# Patient Record
Sex: Male | Born: 1980 | Race: Black or African American | Hispanic: No | Marital: Married | Smoking: Former smoker
Health system: Southern US, Community
[De-identification: ages and names within clinical notes are randomized; demographics above are authoritative.]

## PROBLEM LIST (undated history)

## (undated) DIAGNOSIS — I70219 Atherosclerosis of native arteries of extremities with intermittent claudication, unspecified extremity: Secondary | ICD-10-CM

## (undated) DIAGNOSIS — M549 Dorsalgia, unspecified: Secondary | ICD-10-CM

## (undated) DIAGNOSIS — R110 Nausea: Secondary | ICD-10-CM

## (undated) DIAGNOSIS — F419 Anxiety disorder, unspecified: Secondary | ICD-10-CM

## (undated) DIAGNOSIS — M059 Rheumatoid arthritis with rheumatoid factor, unspecified: Secondary | ICD-10-CM

## (undated) DIAGNOSIS — I10 Essential (primary) hypertension: Secondary | ICD-10-CM

## (undated) DIAGNOSIS — I499 Cardiac arrhythmia, unspecified: Secondary | ICD-10-CM

## (undated) DIAGNOSIS — R0600 Dyspnea, unspecified: Secondary | ICD-10-CM

## (undated) DIAGNOSIS — M359 Systemic involvement of connective tissue, unspecified: Secondary | ICD-10-CM

## (undated) DIAGNOSIS — E785 Hyperlipidemia, unspecified: Secondary | ICD-10-CM

## (undated) HISTORY — PX: HIP PINNING: SHX1757

---

## 2004-12-16 ENCOUNTER — Emergency Department: Payer: Self-pay | Admitting: Internal Medicine

## 2005-11-25 ENCOUNTER — Emergency Department: Payer: Self-pay | Admitting: Emergency Medicine

## 2005-11-26 ENCOUNTER — Emergency Department: Payer: Self-pay | Admitting: Unknown Physician Specialty

## 2005-11-27 ENCOUNTER — Emergency Department: Payer: Self-pay | Admitting: Emergency Medicine

## 2005-12-25 ENCOUNTER — Emergency Department: Payer: Self-pay | Admitting: Emergency Medicine

## 2005-12-25 ENCOUNTER — Other Ambulatory Visit: Payer: Self-pay

## 2006-05-01 DIAGNOSIS — I10 Essential (primary) hypertension: Secondary | ICD-10-CM

## 2006-05-01 DIAGNOSIS — F419 Anxiety disorder, unspecified: Secondary | ICD-10-CM | POA: Insufficient documentation

## 2006-05-01 DIAGNOSIS — G4700 Insomnia, unspecified: Secondary | ICD-10-CM

## 2006-12-01 IMAGING — CR DG CHEST 2V
1 series · 2 of 2 positions shown · non-contrast
Comparison: none

REASON FOR EXAM: Coughing up blood
COMMENTS:

PROCEDURE:     DXR - DXR CHEST PA (OR AP) AND LATERAL  - [DATE] [DATE]
RESULT:     PA and lateral views reveal the cardiomediastinal structures to
be within normal limits. The lung fields are clear. The vascularity is
within normal limits with no effusions noted.

[Series 1: view not recorded · 0.17mm/px · 2 of 2 slices shown]
[im 1/2]
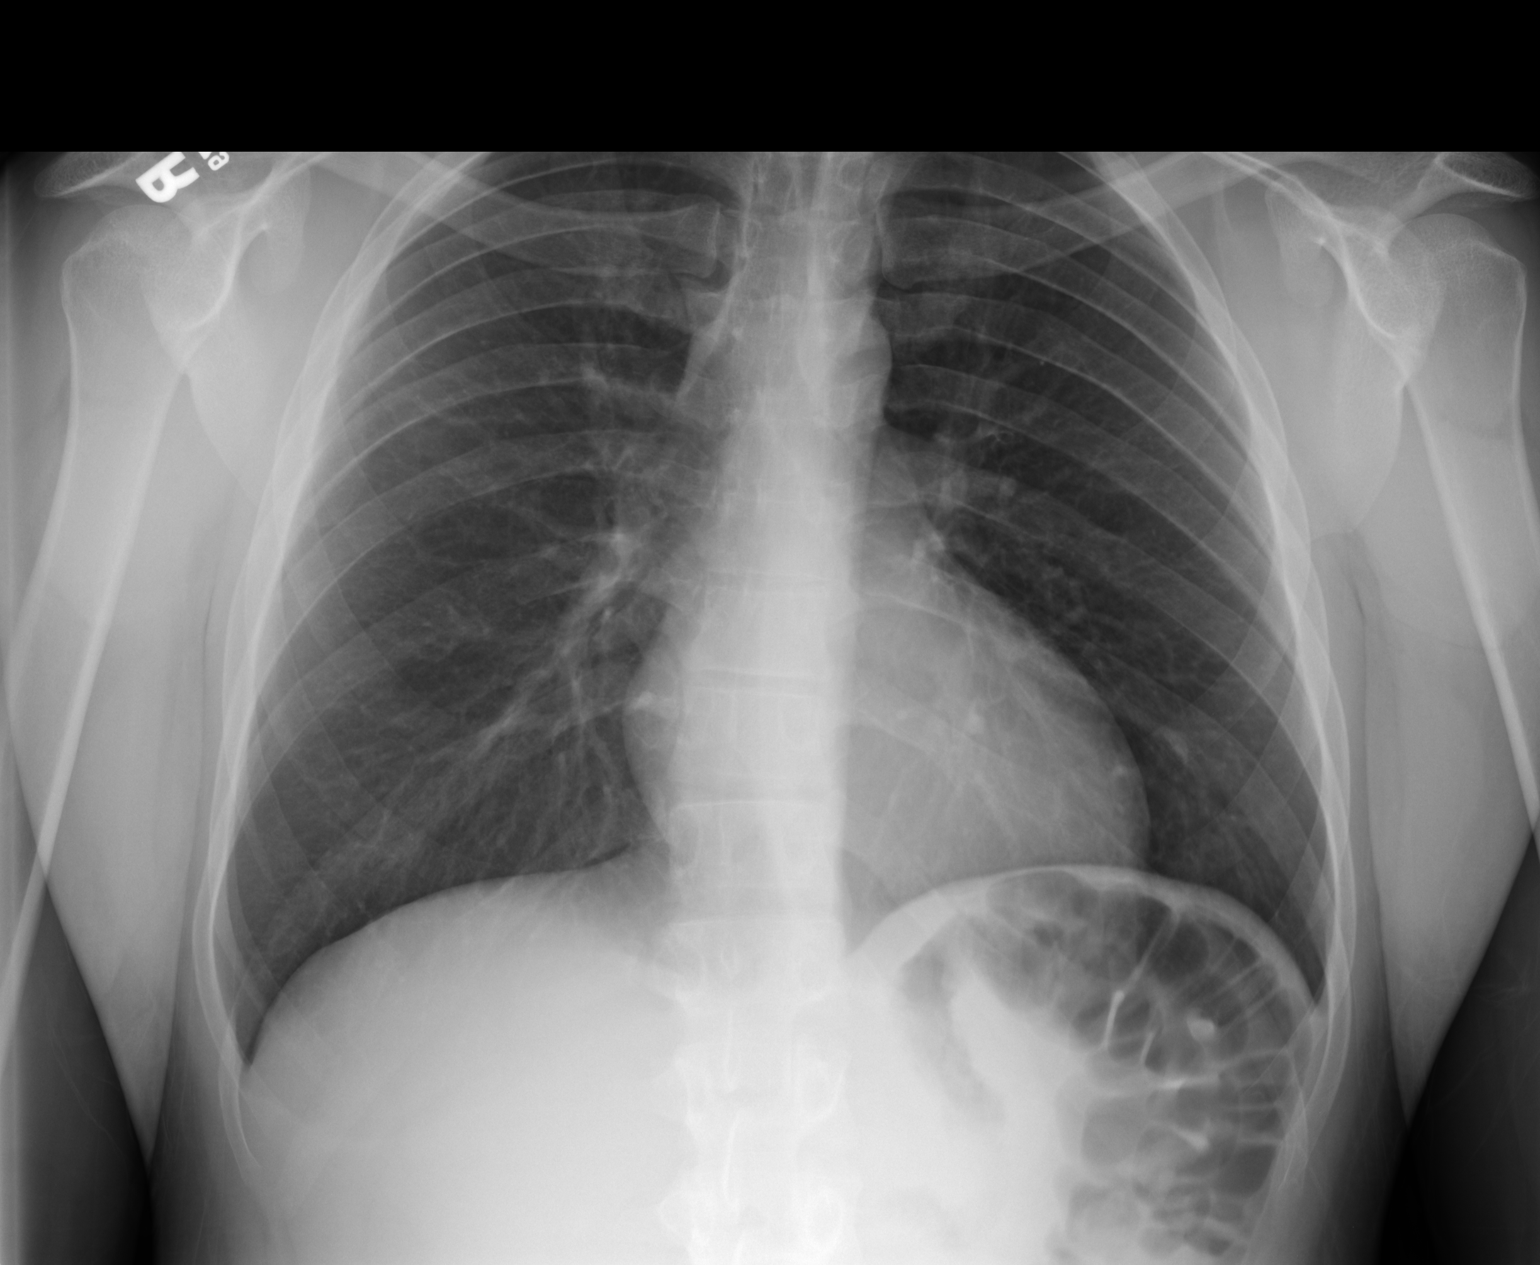
[im 2/2]
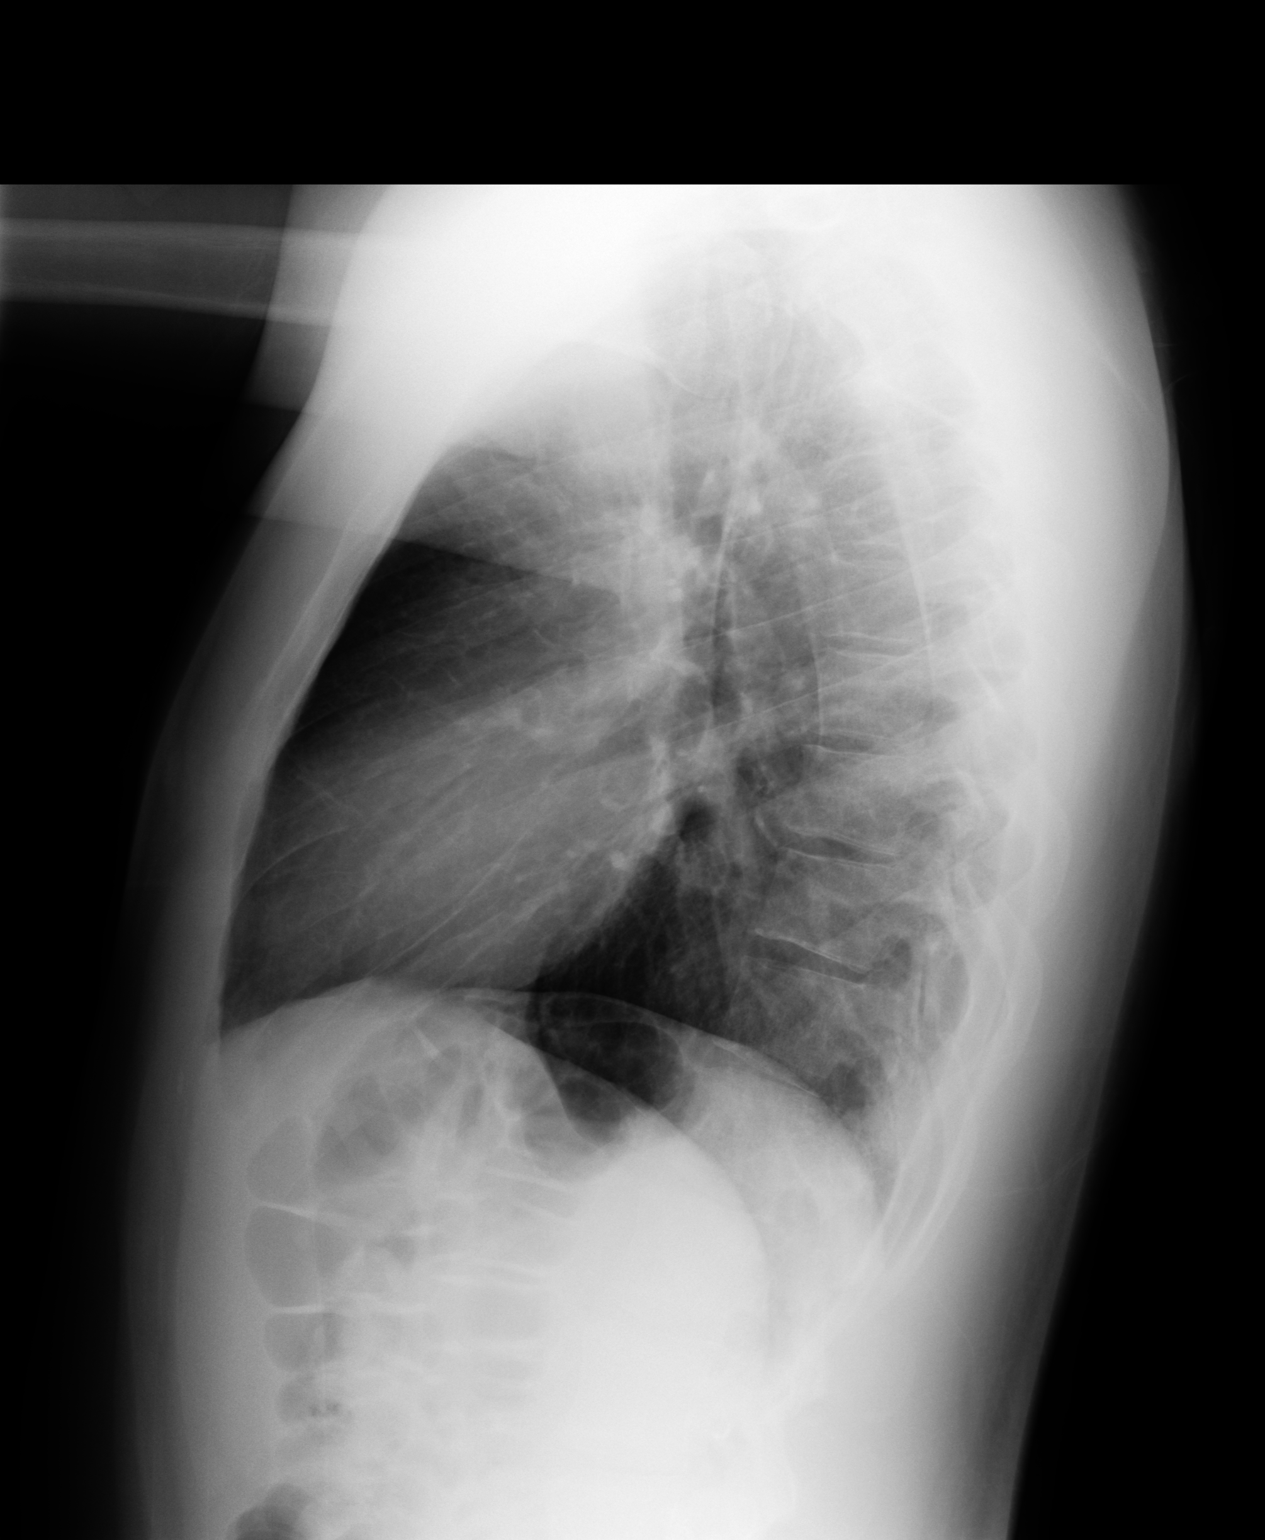

[2 of 2 positions shown; findings below may reference images not displayed]

IMPRESSION: No active cardiopulmonary disease identified.

## 2007-02-01 ENCOUNTER — Emergency Department: Payer: Self-pay | Admitting: Unknown Physician Specialty

## 2008-03-28 DIAGNOSIS — F17200 Nicotine dependence, unspecified, uncomplicated: Secondary | ICD-10-CM | POA: Insufficient documentation

## 2008-10-03 ENCOUNTER — Ambulatory Visit: Payer: Self-pay | Admitting: Family Medicine

## 2008-10-03 IMAGING — US ABDOMEN ULTRASOUND
1 series · 17 of 25 positions shown · non-contrast
Comparison: none

REASON FOR EXAM: nausea vomitting RUQ abd pain
COMMENTS:

[Series 1: abdomen ultrasound · 17 of 69 slices shown]
[im 1/69]
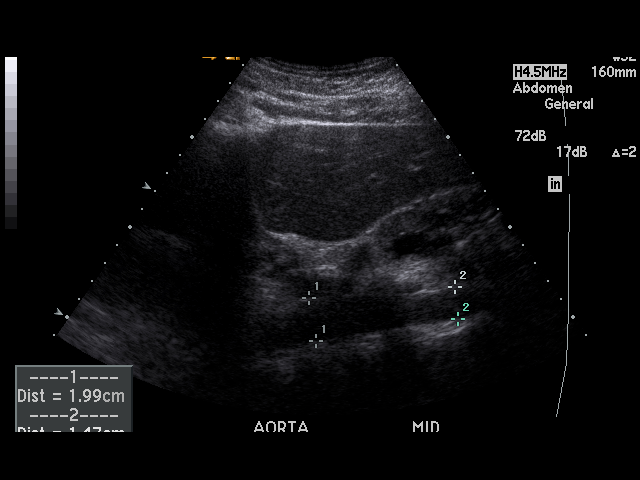
[im 6/69]
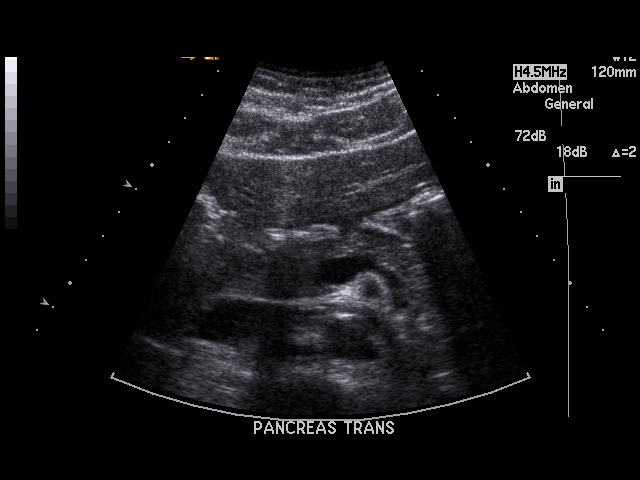
[im 9/69]
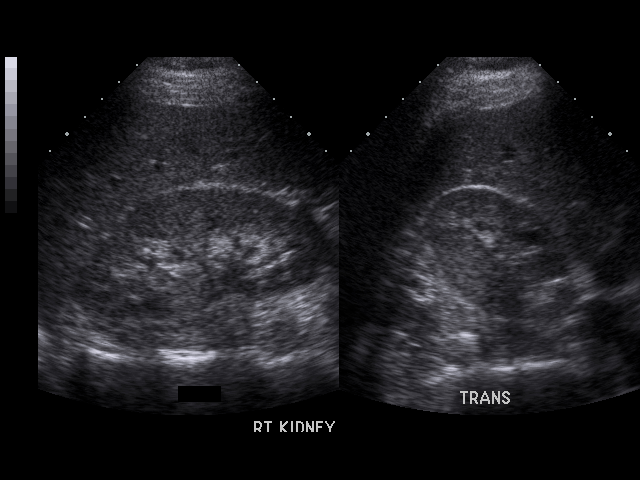
[im 15/69]
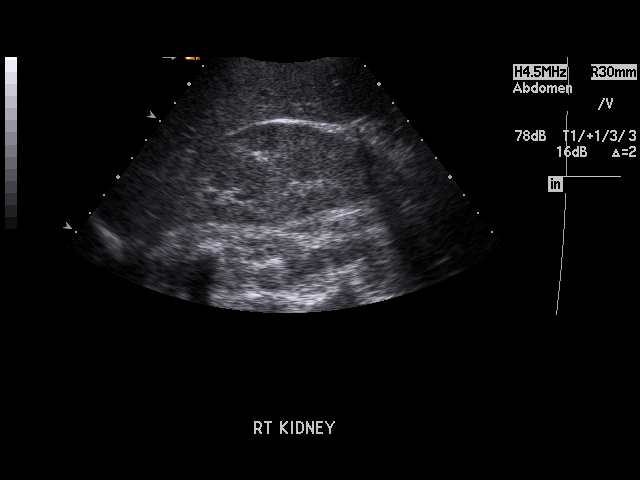
[im 18/69]
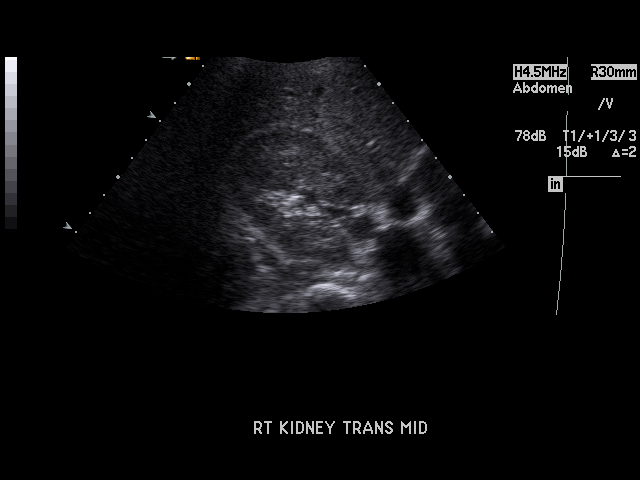
[im 23/69]
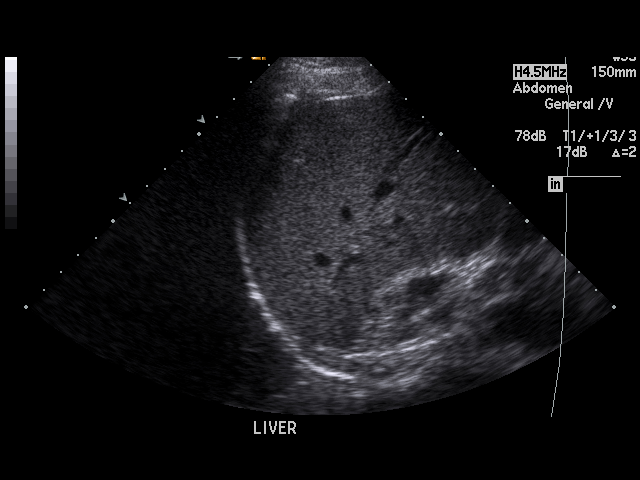
[im 26/69]
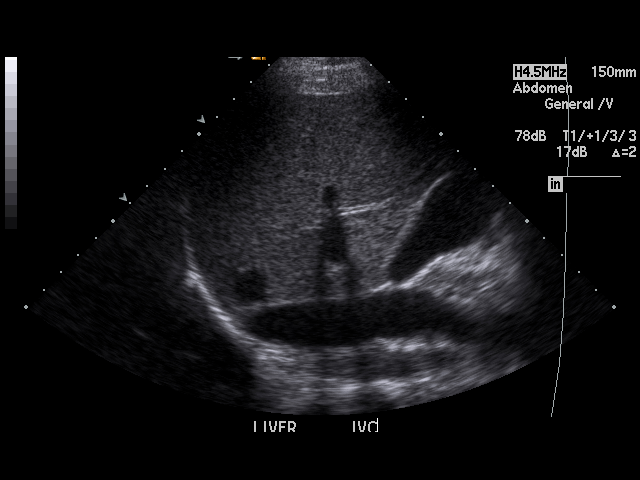
[im 32/69]
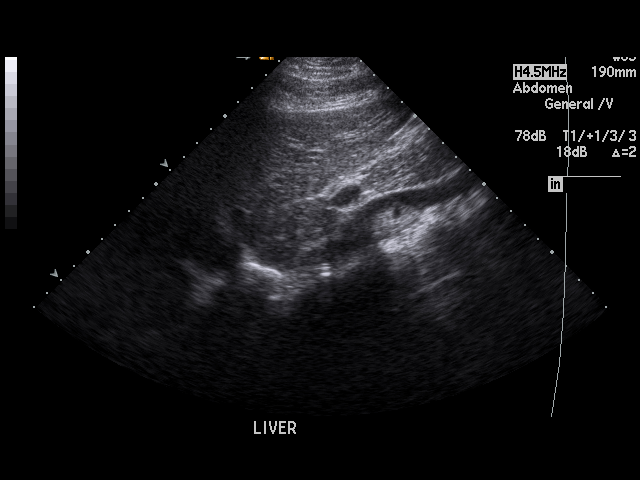
[im 35/69]
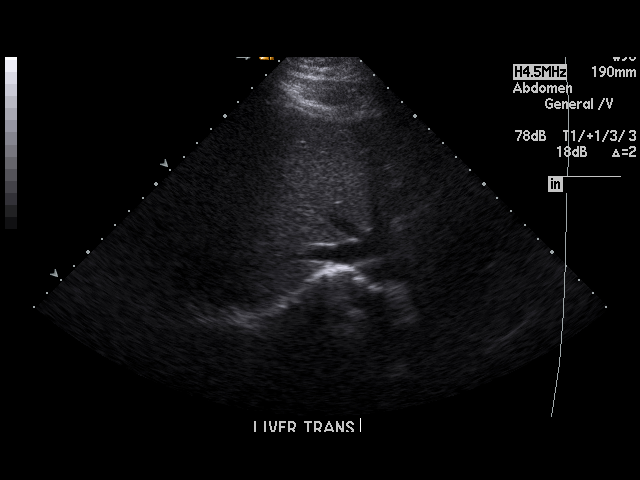
[im 37/69]
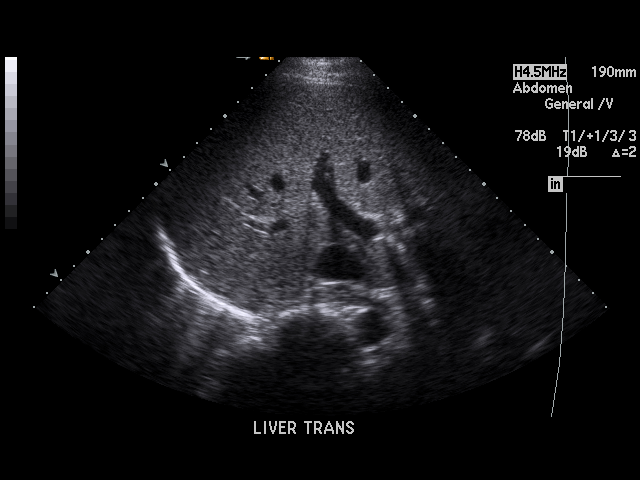
[im 43/69]
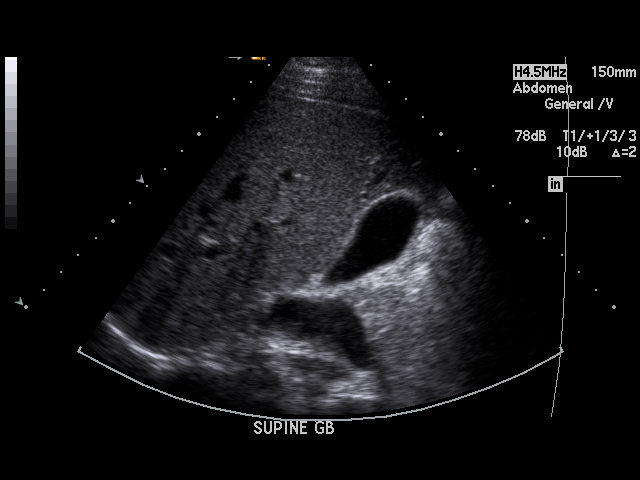
[im 46/69]
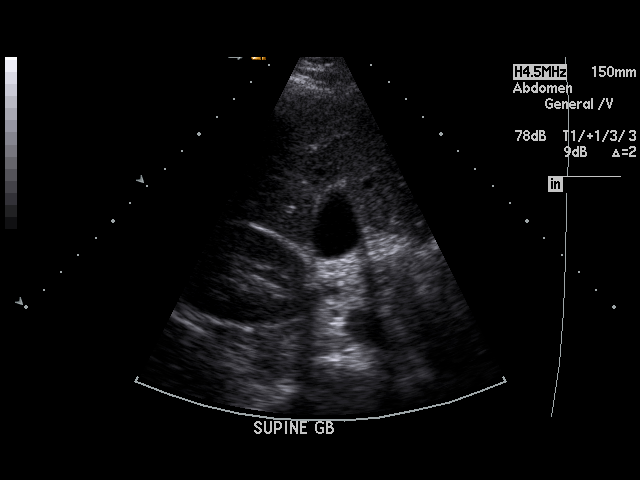
[im 52/69]
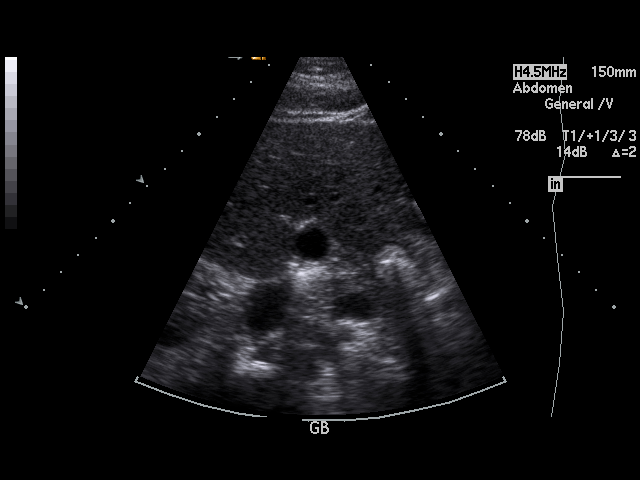
[im 54/69]
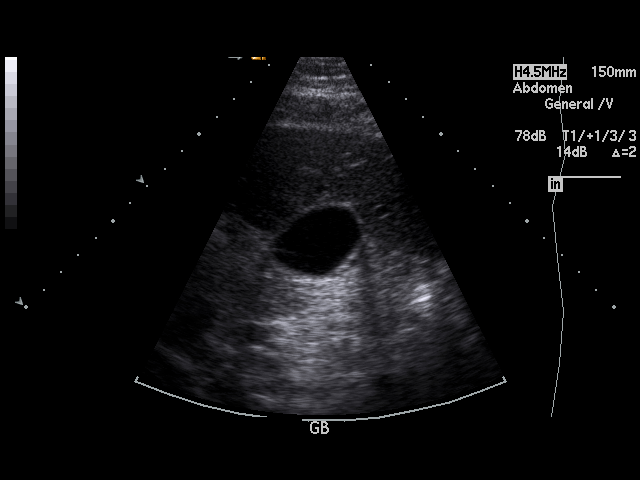
[im 60/69]
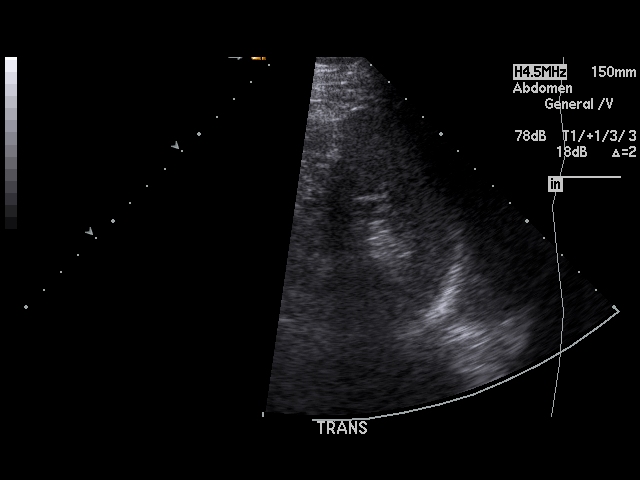
[im 63/69]
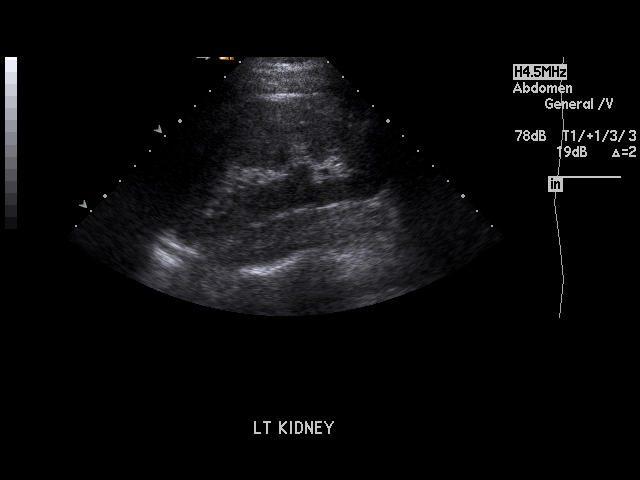
[im 69/69]
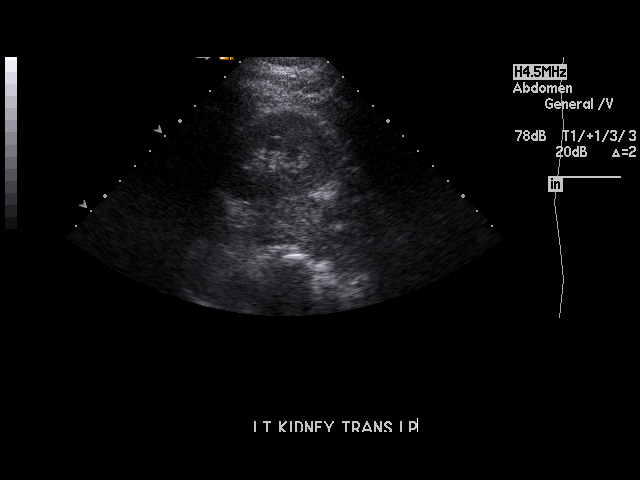

[17 of 25 positions shown; findings below may reference images not displayed]

PROCEDURE:     US  - US ABDOMEN GENERAL SURVEY  - [DATE]  [DATE]

RESULT:     The liver, spleen, pancreas, abdominal aorta and inferior vena
cava show no significant abnormalities. No gallstones are seen. There is no
thickening of the gallbladder wall. The common bile duct measures 2.8 mm in
diameter which is within normal limits. The kidneys show no hydronephrosis.
Incidental note is made of a 1.23 cm cyst of the midpole region of the right
kidney. There is no ascites.
IMPRESSION: Normal study except for an incidentally noted cyst of the right kidney.

## 2010-03-14 DIAGNOSIS — E785 Hyperlipidemia, unspecified: Secondary | ICD-10-CM

## 2010-10-12 ENCOUNTER — Inpatient Hospital Stay: Payer: Self-pay | Admitting: Psychiatry

## 2012-01-31 DIAGNOSIS — L0100 Impetigo, unspecified: Secondary | ICD-10-CM | POA: Insufficient documentation

## 2014-09-19 DIAGNOSIS — R7689 Other specified abnormal immunological findings in serum: Secondary | ICD-10-CM | POA: Insufficient documentation

## 2014-09-19 DIAGNOSIS — M2550 Pain in unspecified joint: Secondary | ICD-10-CM | POA: Insufficient documentation

## 2014-09-19 DIAGNOSIS — R768 Other specified abnormal immunological findings in serum: Secondary | ICD-10-CM | POA: Insufficient documentation

## 2014-09-19 DIAGNOSIS — L300 Nummular dermatitis: Secondary | ICD-10-CM | POA: Insufficient documentation

## 2015-09-01 DIAGNOSIS — R9431 Abnormal electrocardiogram [ECG] [EKG]: Secondary | ICD-10-CM | POA: Insufficient documentation

## 2015-09-18 DIAGNOSIS — I70219 Atherosclerosis of native arteries of extremities with intermittent claudication, unspecified extremity: Secondary | ICD-10-CM | POA: Insufficient documentation

## 2016-05-13 ENCOUNTER — Encounter: Payer: Self-pay | Admitting: Emergency Medicine

## 2016-05-13 ENCOUNTER — Emergency Department
Admission: EM | Admit: 2016-05-13 | Discharge: 2016-05-13 | Disposition: A | Discharge status: Home / Self Care | Payer: Medicaid Other | Attending: Emergency Medicine | Admitting: Emergency Medicine

## 2016-05-13 DIAGNOSIS — M545 Low back pain, unspecified: Secondary | ICD-10-CM

## 2016-05-13 HISTORY — DX: Dorsalgia, unspecified: M54.9

## 2016-05-13 MED ORDER — DEXAMETHASONE SODIUM PHOSPHATE 4 MG/ML IJ SOLN
10.0000 mg | Freq: Once | INTRAMUSCULAR | Status: AC
  Administered 2016-05-13: 10 mg via INTRAMUSCULAR
  Filled 2016-05-13: qty 3
  Filled 2016-05-13: qty 1

## 2016-05-13 MED ORDER — KETOROLAC TROMETHAMINE 30 MG/ML IJ SOLN
30.0000 mg | Freq: Once | INTRAMUSCULAR | Status: AC
  Administered 2016-05-13: 30 mg via INTRAMUSCULAR
  Filled 2016-05-13: qty 1

## 2016-05-13 MED ORDER — NAPROXEN 500 MG PO TABS: 500.0000 mg | ORAL_TABLET | Freq: Two times a day (BID) | ORAL | 2 refills | Status: DC

## 2016-05-13 NOTE — ED Provider Notes (Signed)
[facility] Emergency Department Provider Note   ____________________________________________    I have reviewed the triage vital signs and the nursing notes.   HISTORY  Chief Complaint Back Pain     HPI Jeffrey Grant is a 35 y.o. male who presents with moderate, sharp, low back pain. Patient reports a history of rheumatoid arthritis, had been receiving methotrexate injections but has not seen his physician sent several months. He complains of lower back pain for 2-3 days, he states he has had this before as a rheumatoid flare but is not sure if this is same thing. He denies fevers or chills. No IVDA. Normal strength and sensation in his lower extremities. No abdominal pain   Past Medical History:  Diagnosis Date  . Back pain     There are no active problems to display for this patient.   History reviewed. No pertinent surgical history.  Prior to Admission medications   Medication Sig Start Date End Date Taking? Authorizing Provider  naproxen (NAPROSYN) 500 MG tablet Take 1 tablet (500 mg total) by mouth 2 (two) times daily with a meal. 05/13/16   Jene Every, MD     Allergies Patient has no known allergies.  No family history on file.  Social History Social History  Substance Use Topics  . Smoking status: Never Smoker  . Smokeless tobacco: Never Used  . Alcohol use Not on file    Review of Systems  Constitutional: No fever/chills     Gastrointestinal: No abdominal pain.  No nausea, no vomiting.   Genitourinary: Negative for dysuria. Musculoskeletal: Back pain as above Skin: Negative for rash. Neurological: Negative for weakness or sensory deficit    ____________________________________________   PHYSICAL EXAM:  VITAL SIGNS: ED Triage Vitals  Enc Vitals Group     BP 05/13/16 0845 (!) 191/98     Pulse Rate 05/13/16 0845 (!) 102     Resp 05/13/16 0845 18     Temp 05/13/16 0845 98.3 F (36.8 C)     Temp Source  05/13/16 0845 Oral     SpO2 05/13/16 0845 100 %     Weight 05/13/16 0845 180 lb (81.6 kg)     Height 05/13/16 0845 5\' 9"  (1.753 m)     Head Circumference --      Peak Flow --      Pain Score 05/13/16 0844 9     Pain Loc --      Pain Edu? --      Excl. in GC? --     Constitutional: Alert and oriented. No acute distress. Pleasant and interactive Eyes: Conjunctivae are normal.    Mouth/Throat: Mucous membranes are moist.   Cardiovascular: Normal rate, regular rhythm.  Respiratory: Normal respiratory effort.  No retractions. Genitourinary: deferred Musculoskeletal: Normal strength in the lower extremity. No vertebral tenderness to palpation. No palpable muscle spasms Neurologic:  Normal speech and language. No gross focal neurologic deficits are appreciated. Normal sensation in the lower extremity is Skin:  Skin is warm, dry and intact. No rash noted.   ____________________________________________   LABS (all labs ordered are listed, but only abnormal results are displayed)  Labs Reviewed - No data to display ____________________________________________  EKG   ____________________________________________  RADIOLOGY  None ____________________________________________   PROCEDURES  Procedure(s) performed: No    Critical Care performed: No ____________________________________________   INITIAL IMPRESSION / ASSESSMENT AND PLAN / ED COURSE  Pertinent labs & imaging results that were available during my care of  the patient were reviewed by me and considered in my medical decision making (see chart for details).  Patient uncomfortable from back pain but otherwise no acute distress. We will treat with Toradol and Decadron and reevaluate.  Patient significant improvement after treatment. He is anxious to go home and think this is reasonable. He knows to return if worsening symptoms. ____________________________________________   FINAL CLINICAL IMPRESSION(S) / ED  DIAGNOSES  Final diagnoses:  Acute bilateral low back pain without sciatica      NEW MEDICATIONS STARTED DURING THIS VISIT:  Discharge Medication List as of 05/13/2016 10:07 AM    START taking these medications   Details  naproxen (NAPROSYN) 500 MG tablet Take 1 tablet (500 mg total) by mouth 2 (two) times daily with a meal., Starting Mon 05/13/2016, Print         Note:  This document was prepared using Dragon voice recognition software and may include unintentional dictation errors.    Jene Every, MD 05/13/16 1414

## 2016-05-13 NOTE — ED Triage Notes (Signed)
Reports lower back pain, hx of the same.  Ambulates well.

## 2016-09-07 ENCOUNTER — Emergency Department
Admission: EM | Admit: 2016-09-07 | Discharge: 2016-09-07 | Disposition: A | Discharge status: Home / Self Care | Payer: Medicaid Other | Attending: Emergency Medicine | Admitting: Emergency Medicine

## 2016-09-07 ENCOUNTER — Encounter: Payer: Self-pay | Admitting: Emergency Medicine

## 2016-09-07 DIAGNOSIS — M545 Low back pain: Secondary | ICD-10-CM | POA: Diagnosis not present

## 2016-09-07 DIAGNOSIS — F17200 Nicotine dependence, unspecified, uncomplicated: Secondary | ICD-10-CM | POA: Insufficient documentation

## 2016-09-07 LAB — CBC
HCT: 45.4 % (ref 40.0–52.0)
Hemoglobin: 15.1 g/dL (ref 13.0–18.0)
MCH: 29.5 pg (ref 26.0–34.0)
MCHC: 33.3 g/dL (ref 32.0–36.0)
MCV: 88.7 fL (ref 80.0–100.0)
PLATELETS: 302 10*3/uL (ref 150–440)
RBC: 5.11 MIL/uL (ref 4.40–5.90)
RDW: 15.2 % — AB (ref 11.5–14.5)
WBC: 8.7 10*3/uL (ref 3.8–10.6)

## 2016-09-07 LAB — BASIC METABOLIC PANEL
Anion gap: 6 (ref 5–15)
BUN: 14 mg/dL (ref 6–20)
CHLORIDE: 101 mmol/L (ref 101–111)
CO2: 29 mmol/L (ref 22–32)
CREATININE: 0.74 mg/dL (ref 0.61–1.24)
Calcium: 9.1 mg/dL (ref 8.9–10.3)
GFR calc Af Amer: >60 mL/min (ref >60)
Glucose, Bld: 148 mg/dL — ABNORMAL HIGH (ref 65–99)
Potassium: 4.3 mmol/L (ref 3.5–5.1)
SODIUM: 136 mmol/L (ref 135–145)

## 2016-09-07 LAB — URINALYSIS, ROUTINE W REFLEX MICROSCOPIC
BILIRUBIN URINE: NEGATIVE
GLUCOSE, UA: NEGATIVE mg/dL
HGB URINE DIPSTICK: NEGATIVE
KETONES UR: NEGATIVE mg/dL
Leukocytes, UA: NEGATIVE
NITRITE: NEGATIVE
PH: 6 (ref 5.0–8.0)
Protein, ur: NEGATIVE mg/dL
Specific Gravity, Urine: 1.018 (ref 1.005–1.030)

## 2016-09-07 MED ORDER — DEXAMETHASONE SODIUM PHOSPHATE 10 MG/ML IJ SOLN
10.0000 mg | Freq: Once | INTRAMUSCULAR | Status: AC
  Administered 2016-09-07: 10 mg via INTRAMUSCULAR
  Filled 2016-09-07: qty 1

## 2016-09-07 MED ORDER — KETOROLAC TROMETHAMINE 60 MG/2ML IM SOLN
60.0000 mg | Freq: Once | INTRAMUSCULAR | Status: AC
  Administered 2016-09-07: 60 mg via INTRAMUSCULAR
  Filled 2016-09-07: qty 2

## 2016-09-07 MED ORDER — DEXAMETHASONE SODIUM PHOSPHATE 10 MG/ML IJ SOLN: 10.0000 mg | Freq: Once | INTRAMUSCULAR | Status: DC

## 2016-09-07 MED ORDER — NAPROXEN 500 MG PO TABS: 500.0000 mg | ORAL_TABLET | Freq: Two times a day (BID) | ORAL | 2 refills | Status: DC

## 2016-09-07 NOTE — ED Triage Notes (Signed)
Pt to ed with c/o back pain.  Pt states he was seen this past week at his PMD for protein in his urine. Reports pain continues in back.  Reports had blood drawn but does not know results.

## 2016-09-07 NOTE — ED Provider Notes (Signed)
[facility] Emergency Department Provider Note  ____________________________________________   I have reviewed the triage vital signs and the nursing notes.   HISTORY  Chief Complaint Back Pain   History limited by: Not Limited   HPI Jeffrey Grant is a 36 y.o. male who presents to the emergency department today because of concerns for low back pain. Patient states that he thinks it might be an RA flare. He had similar pain in the end of last year which did improve with steroids. Patient has history of her elbow currently is not on any medications. He denies any heavy lifting or abnormal work or exercise yesterday. He denies any change in defecation or urination. He does state however that his primary care doctor said he did have some protein in his urine.   Past Medical History:  Diagnosis Date  . Back pain     There are no active problems to display for this patient.   History reviewed. No pertinent surgical history.  Prior to Admission medications   Medication Sig Start Date End Date Taking? Authorizing Provider  naproxen (NAPROSYN) 500 MG tablet Take 1 tablet (500 mg total) by mouth 2 (two) times daily with a meal. 05/13/16   Jene Every, MD    Allergies Patient has no known allergies.  History reviewed. No pertinent family history.  Social History Social History  Substance Use Topics  . Smoking status: Current Every Day Smoker  . Smokeless tobacco: Never Used  . Alcohol use No    Review of Systems  Constitutional: Negative for fever. Cardiovascular: Negative for chest pain. Respiratory: Negative for shortness of breath. Gastrointestinal: Negative for abdominal pain, vomiting and diarrhea. Genitourinary: Negative for dysuria. Musculoskeletal: Positive for low back pain. Skin: Negative for rash. Neurological: Negative for headaches, focal weakness or numbness.  10-point ROS otherwise  negative.  ____________________________________________   PHYSICAL EXAM:  VITAL SIGNS: ED Triage Vitals  Enc Vitals Group     BP 09/07/16 1509 (!) 170/95     Pulse Rate 09/07/16 1509 84     Resp 09/07/16 1509 18     Temp 09/07/16 1509 98.2 F (36.8 C)     Temp Source 09/07/16 1509 Oral     SpO2 09/07/16 1509 100 %     Weight 09/07/16 1509 180 lb (81.6 kg)     Height --      Head Circumference --      Peak Flow --      Pain Score 09/07/16 1510 8    Constitutional: Alert and oriented. Well appearing and in no distress. Eyes: Conjunctivae are normal. Normal extraocular movements. ENT   Head: Normocephalic and atraumatic.   Nose: No congestion/rhinnorhea.   Mouth/Throat: Mucous membranes are moist.   Neck: No stridor. Hematological/Lymphatic/Immunilogical: No cervical lymphadenopathy. Cardiovascular: Normal rate, regular rhythm.  No murmurs, rubs, or gallops. Respiratory: Normal respiratory effort without tachypnea nor retractions. Breath sounds are clear and equal bilaterally. No wheezes/rales/rhonchi. Gastrointestinal: Soft and non tender. No rebound. No guarding.  Genitourinary: Deferred Musculoskeletal: Normal range of motion in all extremities. No lower extremity edema. No tenderness to palpation of the spine. Neurologic:  Normal speech and language. No gross focal neurologic deficits are appreciated.  Skin:  Skin is warm, dry and intact. No rash noted. Psychiatric: Mood and affect are normal. Speech and behavior are normal. Patient exhibits appropriate insight and judgment.  ____________________________________________    LABS (pertinent positives/negatives)  Labs Reviewed  BASIC METABOLIC PANEL - Abnormal; Notable for the following:  Result Value   Glucose, Bld 148 (*)    All other components within normal limits  CBC - Abnormal; Notable for the following:    RDW 15.2 (*)    All other components within normal limits  URINALYSIS, ROUTINE W  REFLEX MICROSCOPIC - Abnormal; Notable for the following:    Color, Urine YELLOW (*)    APPearance CLEAR (*)    All other components within normal limits     ____________________________________________   EKG  None  ____________________________________________    RADIOLOGY  None  ____________________________________________   PROCEDURES  Procedures  ____________________________________________   INITIAL IMPRESSION / ASSESSMENT AND PLAN / ED COURSE  Pertinent labs & imaging results that were available during my care of the patient were reviewed by me and considered in my medical decision making (see chart for details).  Patient presented to the emergency department today with low back pain. Blood work and urine without concerning findings. Exam without any tenderness to the lumbar spine. Patient states he does have history of RA. Plan on giving patient steroid shot as well as Toradol. Patient states he swallowed up with his rheumatologist next week.  ____________________________________________   FINAL CLINICAL IMPRESSION(S) / ED DIAGNOSES  Final diagnoses:  Acute low back pain, unspecified back pain laterality, with sciatica presence unspecified     Note: This dictation was prepared with Dragon dictation. Any transcriptional errors that result from this process are unintentional     [name], MD 09/07/16 1723

## 2016-09-07 NOTE — Discharge Instructions (Signed)
Please seek medical attention for any high fevers, chest pain, shortness of breath, change in behavior, persistent vomiting, bloody stool or any other new or concerning symptoms.  

## 2016-09-07 NOTE — ED Notes (Signed)
NAD noted at time of D/C. Pt denies questions or concerns. Pt ambulatory to the lobby at this time.  

## 2016-09-16 ENCOUNTER — Emergency Department
Admission: EM | Admit: 2016-09-16 | Discharge: 2016-09-16 | Disposition: A | Discharge status: Home / Self Care | Payer: Medicaid Other | Attending: Emergency Medicine | Admitting: Emergency Medicine

## 2016-09-16 ENCOUNTER — Emergency Department: Payer: Medicaid Other

## 2016-09-16 DIAGNOSIS — F17200 Nicotine dependence, unspecified, uncomplicated: Secondary | ICD-10-CM | POA: Diagnosis not present

## 2016-09-16 DIAGNOSIS — R112 Nausea with vomiting, unspecified: Secondary | ICD-10-CM | POA: Diagnosis not present

## 2016-09-16 DIAGNOSIS — M25552 Pain in left hip: Secondary | ICD-10-CM

## 2016-09-16 HISTORY — DX: Rheumatoid arthritis with rheumatoid factor, unspecified: M05.9

## 2016-09-16 HISTORY — DX: Atherosclerosis of native arteries of extremities with intermittent claudication, unspecified extremity: I70.219

## 2016-09-16 LAB — BASIC METABOLIC PANEL
ANION GAP: 11 (ref 5–15)
BUN: 20 mg/dL (ref 6–20)
CALCIUM: 9.9 mg/dL (ref 8.9–10.3)
CHLORIDE: 96 mmol/L — AB (ref 101–111)
CO2: 26 mmol/L (ref 22–32)
Creatinine, Ser: 1.03 mg/dL (ref 0.61–1.24)
GFR calc non Af Amer: >60 mL/min (ref >60)
Glucose, Bld: 119 mg/dL — ABNORMAL HIGH (ref 65–99)
POTASSIUM: 3.9 mmol/L (ref 3.5–5.1)
Sodium: 133 mmol/L — ABNORMAL LOW (ref 135–145)

## 2016-09-16 LAB — URINALYSIS, COMPLETE (UACMP) WITH MICROSCOPIC
Bacteria, UA: NONE SEEN
Bilirubin Urine: NEGATIVE
Glucose, UA: NEGATIVE mg/dL
Hgb urine dipstick: NEGATIVE
KETONES UR: NEGATIVE mg/dL
Leukocytes, UA: NEGATIVE
Nitrite: NEGATIVE
PH: 6 (ref 5.0–8.0)
Protein, ur: NEGATIVE mg/dL
RBC / HPF: NONE SEEN RBC/hpf (ref 0–5)
SPECIFIC GRAVITY, URINE: 1.016 (ref 1.005–1.030)
SQUAMOUS EPITHELIAL / LPF: NONE SEEN

## 2016-09-16 LAB — CBC
HEMATOCRIT: 49 % (ref 40.0–52.0)
Hemoglobin: 16.2 g/dL (ref 13.0–18.0)
MCH: 28.9 pg (ref 26.0–34.0)
MCHC: 33 g/dL (ref 32.0–36.0)
MCV: 87.6 fL (ref 80.0–100.0)
Platelets: 287 10*3/uL (ref 150–440)
RBC: 5.59 MIL/uL (ref 4.40–5.90)
RDW: 14.9 % — AB (ref 11.5–14.5)
WBC: 10.9 10*3/uL — AB (ref 3.8–10.6)

## 2016-09-16 LAB — GLUCOSE, CAPILLARY: GLUCOSE-CAPILLARY: 121 mg/dL — AB (ref 65–99)

## 2016-09-16 IMAGING — CT CT RENAL STONE PROTOCOL
2 of 4 series · 17 of 46 positions shown, 19 images · non-contrast
Comparison: None.

CLINICAL DATA: Mid low back pain for week.  Zero

EXAM:
CT ABDOMEN AND PELVIS WITHOUT CONTRAST
TECHNIQUE: Multidetector CT imaging of the abdomen and pelvis was performed
following the standard protocol without IV contrast.

[Series 2: stone full standard · axial · 0.80mm/px · z∈[-986,-602]mm · 14 of 85 slices shown, 16 images]
[im 4/85  soft-tissue]
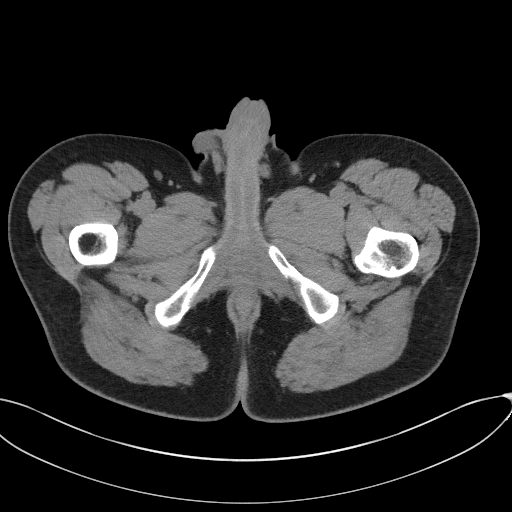
[im 4/85  bone]
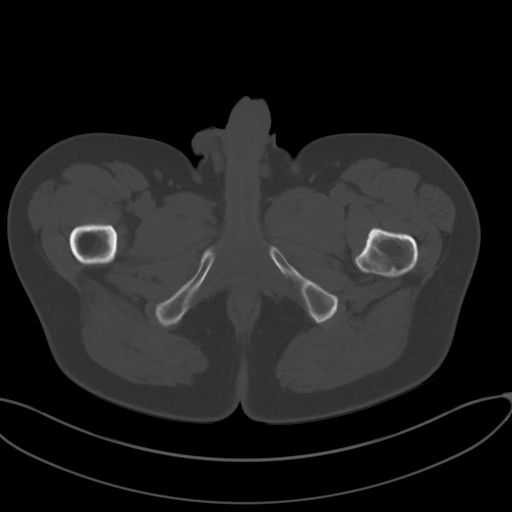
[im 12/85  soft-tissue]
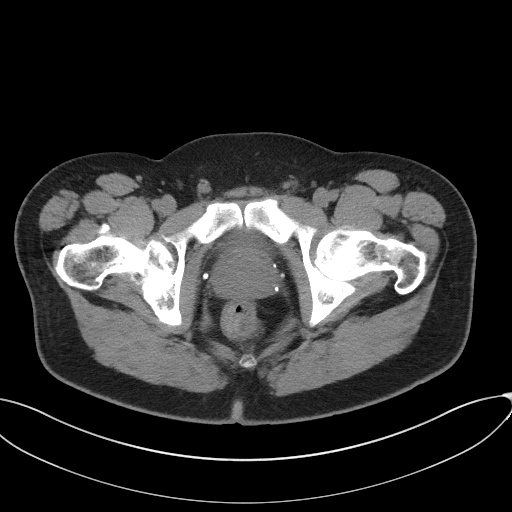
[im 16/85  soft-tissue]
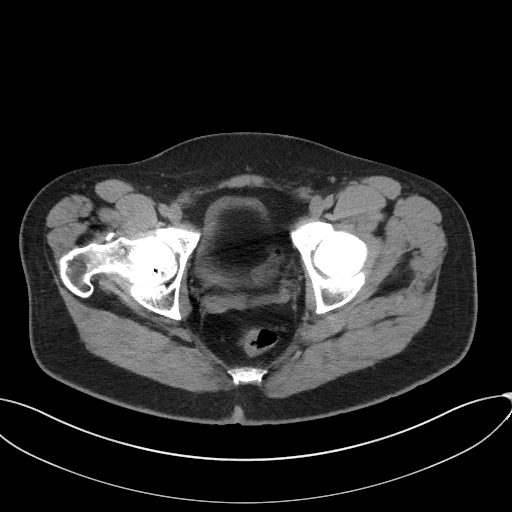
[im 23/85  soft-tissue]
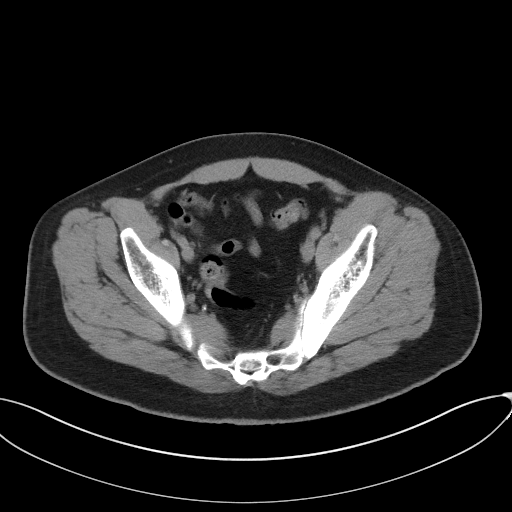
[im 27/85  soft-tissue]
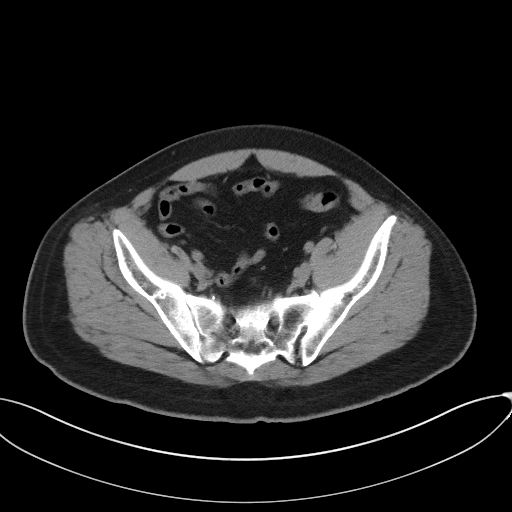
[im 35/85  soft-tissue]
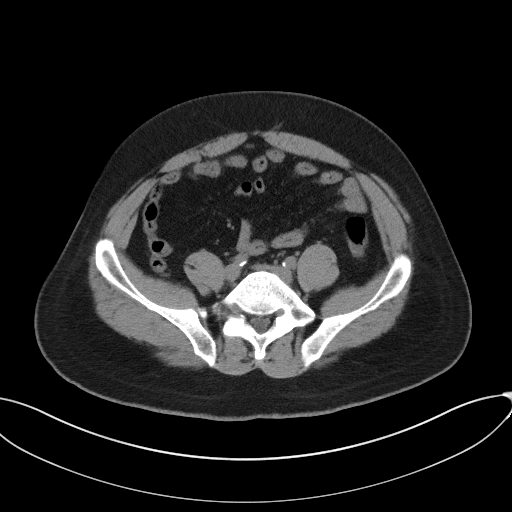
[im 39/85  soft-tissue]
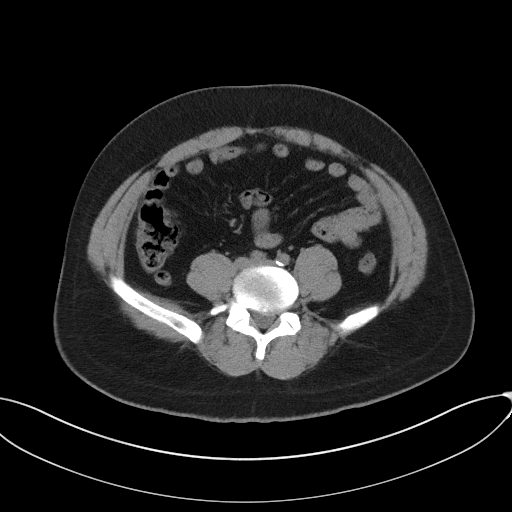
[im 46/85  soft-tissue]
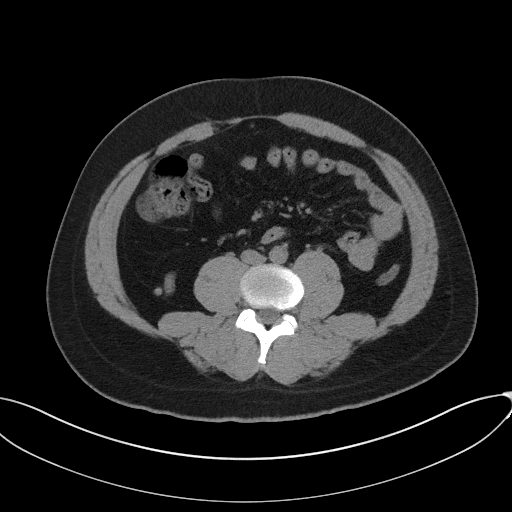
[im 50/85  soft-tissue]
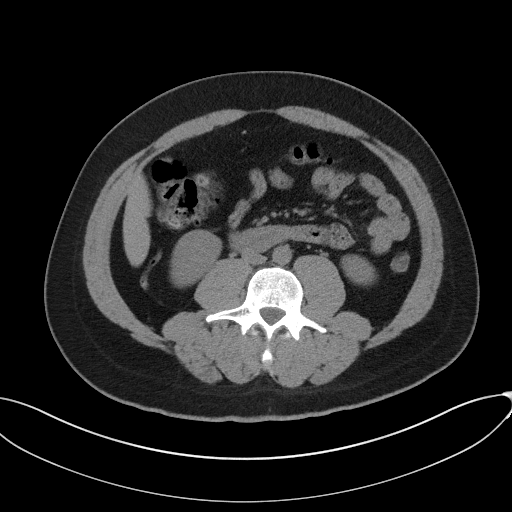
[im 50/85  bone]
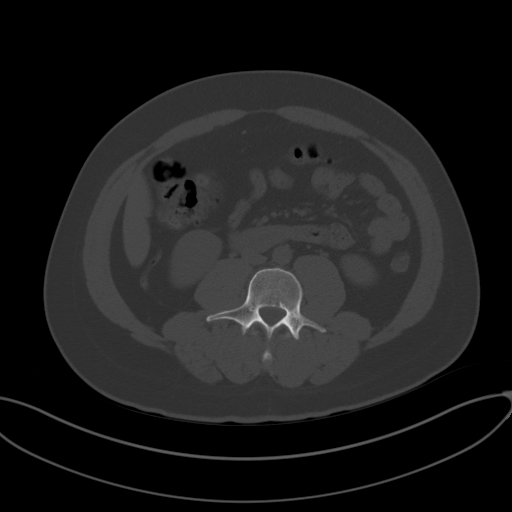
[im 58/85  soft-tissue]
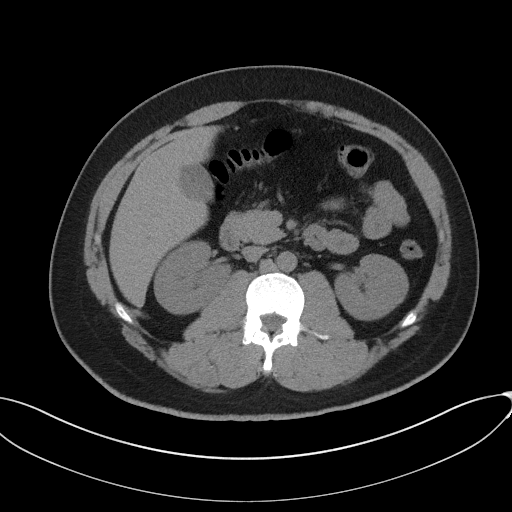
[im 62/85  soft-tissue]
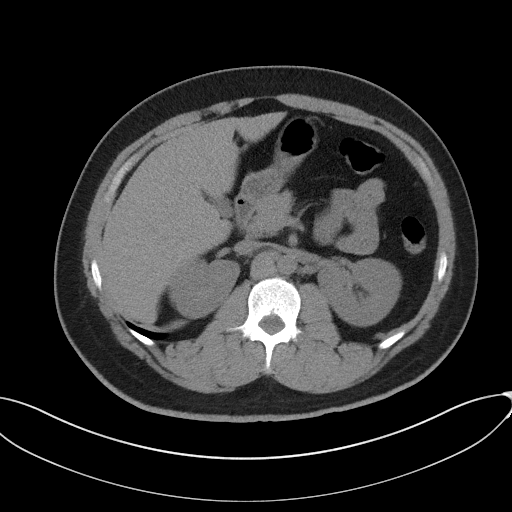
[im 69/85  soft-tissue]
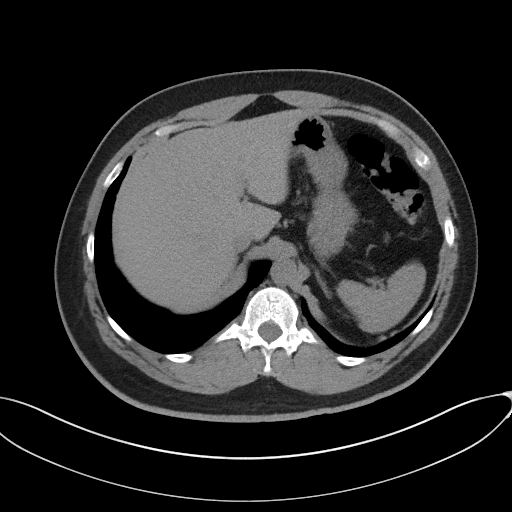
[im 73/85  soft-tissue]
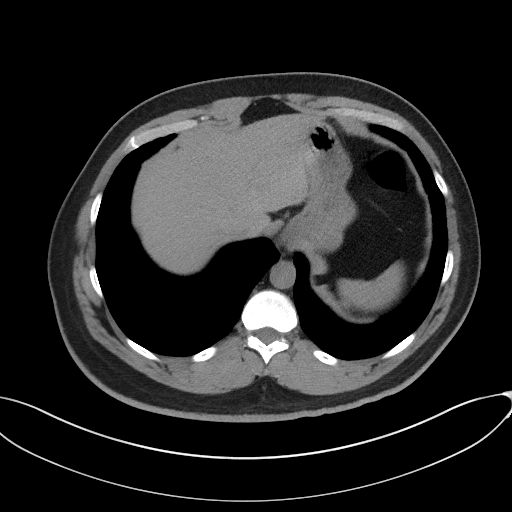
[im 81/85  soft-tissue]
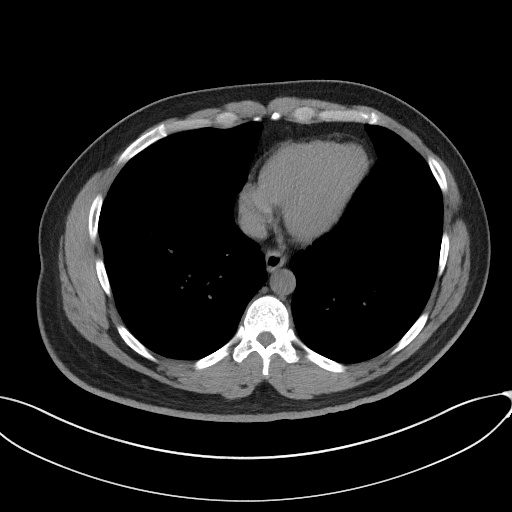

[Series 5: coronal · coronal · 0.77mm/px · 3 of 151 slices shown]
[im 51/151  soft-tissue]
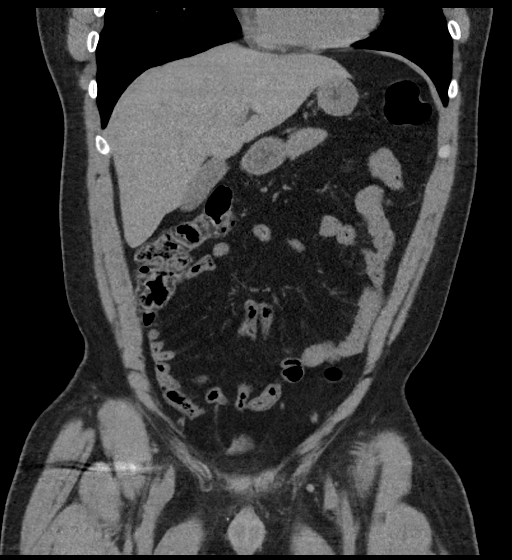
[im 67/151  soft-tissue]
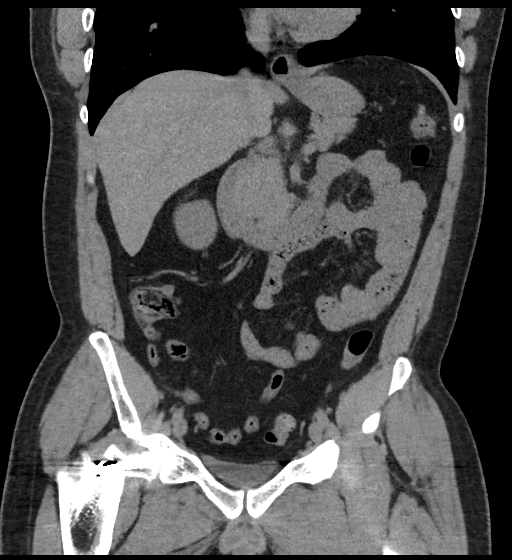
[im 84/151  soft-tissue]
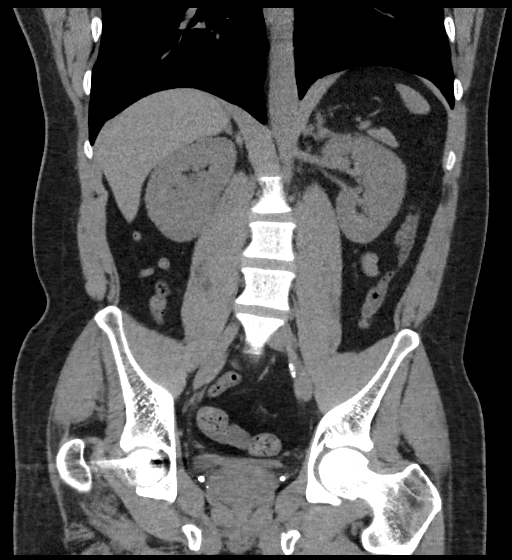

[17 of 46 positions shown; findings below may reference images not displayed]

FINDINGS: Lower chest: No acute abnormality.

Hepatobiliary: No focal liver abnormality is seen. No gallstones,
gallbladder wall thickening, or biliary dilatation.

Pancreas: Unremarkable. No pancreatic ductal dilatation or
surrounding inflammatory changes.

Spleen: Normal in size without focal abnormality.

Adrenals/Urinary Tract: Adrenal glands are unremarkable. Kidneys are
normal, without renal calculi, focal lesion, or hydronephrosis.
Bladder is unremarkable.

Stomach/Bowel: Small hiatal hernia. Stomach is within normal limits.
Appendix appears normal. No evidence of bowel wall thickening,
distention, or inflammatory changes.

Vascular/Lymphatic: Normal caliber abdominal aorta. Mild abdominal
aortic atherosclerosis. No lymphadenopathy.

Reproductive: Prostate is unremarkable.

Other: Small fat containing umbilical hernia. No abdominopelvic
ascites.

Musculoskeletal: No acute or significant osseous findings. Moderate
osteoarthritis of the right hip. Mild osteoarthritis of the left
hip.
IMPRESSION: 1. No acute abdominal or pelvic pathology.

## 2016-09-16 MED ORDER — HYDROCODONE-ACETAMINOPHEN 5-325 MG PO TABS
2.0000 | ORAL_TABLET | Freq: Once | ORAL | Status: AC
  Administered 2016-09-16: 2 via ORAL
  Filled 2016-09-16: qty 2

## 2016-09-16 MED ORDER — DOCUSATE SODIUM 100 MG PO CAPS: ORAL_CAPSULE | ORAL | 0 refills | Status: DC

## 2016-09-16 MED ORDER — HYDROCODONE-ACETAMINOPHEN 5-325 MG PO TABS: 1.0000 | ORAL_TABLET | ORAL | 0 refills | Status: DC | PRN

## 2016-09-16 MED ORDER — ONDANSETRON HCL 4 MG/2ML IJ SOLN
4.0000 mg | Freq: Once | INTRAMUSCULAR | Status: AC | PRN
Start: 2016-09-16 — End: 2016-09-16
  Administered 2016-09-16: 4 mg via INTRAVENOUS
  Filled 2016-09-16: qty 2

## 2016-09-16 MED ORDER — SODIUM CHLORIDE 0.9 % IV BOLUS (SEPSIS)
1000.0000 mL | Freq: Once | INTRAVENOUS | Status: AC
  Administered 2016-09-16: 1000 mL via INTRAVENOUS

## 2016-09-16 NOTE — ED Notes (Signed)
Patient transported to CT 

## 2016-09-16 NOTE — ED Provider Notes (Signed)
[facility] Emergency Department Provider Note  ____________________________________________   First MD Initiated Contact with Patient 09/16/16 1807     (approximate)  I have reviewed the triage vital signs and the nursing notes.   HISTORY  Chief Complaint Flank Pain    HPI Jeffrey Grant is a 36 y.o. male with a history of seropositive rheumatoid arthritis who is followed at [facility].  He presents for left-sided hip pain with occasional nausea and vomiting that was present last week, got better and then started back up.  He had a reassuring workup last week and has been to see his rheumatologist.  He has plans to start back on methotrexate and is on some other medications right now as well but he cannot remember their names.  When the pain started back up again today he thought he should get it checked out.  He describes the pain as sharp and aching in the left hip down below the flank.  He denies fever/chills, chest pain, shortness of breath, nausea, vomiting, abdominal pain, diarrhea, dysuria.He also denies numbness, tingling, or weakness in any of his extremities.   Past Medical History:  Diagnosis Date  . Atherosclerotic PVD with intermittent claudication (HCC)   . Back pain   . Seropositive rheumatoid arthritis (HCC)   . Sjogren's syndrome (HCC)     There are no active problems to display for this patient.   History reviewed. No pertinent surgical history.  Prior to Admission medications   Medication Sig Start Date End Date Taking? Authorizing Provider  docusate sodium (COLACE) 100 MG capsule Take 1 tablet once or twice daily as needed for constipation while taking narcotic pain medicine 09/16/16   [name], MD  HYDROcodone-acetaminophen (NORCO/VICODIN) 5-325 MG tablet Take 1-2 tablets by mouth every 4 (four) hours as needed for moderate pain. 09/16/16   [name], MD  naproxen (NAPROSYN) 500 MG tablet Take 1 tablet (500 mg total) by mouth 2  (two) times daily with a meal. 09/07/16   [name], MD    Allergies Patient has no known allergies.  History reviewed. No pertinent family history.  Social History Social History  Substance Use Topics  . Smoking status: Current Every Day Smoker  . Smokeless tobacco: Never Used  . Alcohol use No    Review of Systems Constitutional: No fever/chills Eyes: No visual changes. ENT: No sore throat. Cardiovascular: Denies chest pain. Respiratory: Denies shortness of breath. Gastrointestinal: No abdominal pain.  No nausea, no vomiting.  No diarrhea.  No constipation. Genitourinary: Negative for dysuria. Musculoskeletal: Pain in left hip or left side of lower back Skin: Negative for rash. Neurological: Negative for headaches, focal weakness or numbness.  10-point ROS otherwise negative.  ____________________________________________   PHYSICAL EXAM:  VITAL SIGNS: ED Triage Vitals  Enc Vitals Group     BP 09/16/16 1427 (!) 188/99     Pulse Rate 09/16/16 1427 (!) 146     Resp 09/16/16 1427 20     Temp 09/16/16 1427 97.7 F (36.5 C)     Temp Source 09/16/16 1427 Oral     SpO2 09/16/16 1427 100 %     Weight 09/16/16 1427 198 lb (89.8 kg)     Height 09/16/16 1427 5\' 9"  (1.753 m)     Head Circumference --      Peak Flow --      Pain Score 09/16/16 1426 10     Pain Loc --      Pain Edu? --  Excl. in GC? --     Constitutional: Alert and oriented. Well appearing and in no acute distress. Eyes: Conjunctivae are normal. PERRL. EOMI. Head: Atraumatic. Nose: No congestion/rhinnorhea. Mouth/Throat: Mucous membranes are moist. Neck: No stridor.  No meningeal signs.   Cardiovascular: Normal rate, regular rhythm. Good peripheral circulation. Grossly normal heart sounds. Respiratory: Normal respiratory effort.  No retractions. Lungs CTAB. Gastrointestinal: Soft and nontender. No distention.  Musculoskeletal: No lower extremity tenderness nor edema. No gross deformities of  extremities. No reproducible tenderness to palpation of the lumbar spine, the left flank, the left hip, over the pelvis in general.  Normal range of motion of left hip and he is able to ambulate Neurologic:  Normal speech and language. No gross focal neurologic deficits are appreciated.  Skin:  Skin is warm, dry and intact. No rash noted. Psychiatric: Mood and affect are normal. Speech and behavior are normal.  ____________________________________________   LABS (all labs ordered are listed, but only abnormal results are displayed)  Labs Reviewed  URINALYSIS, COMPLETE (UACMP) WITH MICROSCOPIC - Abnormal; Notable for the following:       Result Value   Color, Urine YELLOW (*)    APPearance CLEAR (*)    All other components within normal limits  BASIC METABOLIC PANEL - Abnormal; Notable for the following:    Sodium 133 (*)    Chloride 96 (*)    Glucose, Bld 119 (*)    All other components within normal limits  CBC - Abnormal; Notable for the following:    WBC 10.9 (*)    RDW 14.9 (*)    All other components within normal limits  GLUCOSE, CAPILLARY - Abnormal; Notable for the following:    Glucose-Capillary 121 (*)    All other components within normal limits   ____________________________________________  EKG  None - EKG not ordered by ED physician ____________________________________________  RADIOLOGY   Ct Renal Stone Study  Result Date: 09/16/2016 CLINICAL DATA:  Mid low back pain for week.  Zero EXAM: CT ABDOMEN AND PELVIS WITHOUT CONTRAST TECHNIQUE: Multidetector CT imaging of the abdomen and pelvis was performed following the standard protocol without IV contrast. COMPARISON:  None. FINDINGS: Lower chest: No acute abnormality. Hepatobiliary: No focal liver abnormality is seen. No gallstones, gallbladder wall thickening, or biliary dilatation. Pancreas: Unremarkable. No pancreatic ductal dilatation or surrounding inflammatory changes. Spleen: Normal in size without focal  abnormality. Adrenals/Urinary Tract: Adrenal glands are unremarkable. Kidneys are normal, without renal calculi, focal lesion, or hydronephrosis. Bladder is unremarkable. Stomach/Bowel: Small hiatal hernia. Stomach is within normal limits. Appendix appears normal. No evidence of bowel wall thickening, distention, or inflammatory changes. Vascular/Lymphatic: Normal caliber abdominal aorta. Mild abdominal aortic atherosclerosis. No lymphadenopathy. Reproductive: Prostate is unremarkable. Other: Small fat containing umbilical hernia. No abdominopelvic ascites. Musculoskeletal: No acute or significant osseous findings. Moderate osteoarthritis of the right hip. Mild osteoarthritis of the left hip. IMPRESSION: 1. No acute abdominal or pelvic pathology. Electronically Signed   By: Elige Ko   On: 09/16/2016 18:43    ____________________________________________   PROCEDURES  Critical Care performed: No   Procedure(s) performed:   Procedures   ____________________________________________   INITIAL IMPRESSION / ASSESSMENT AND PLAN / ED COURSE  Pertinent labs & imaging results that were available during my care of the patient were reviewed by me and considered in my medical decision making (see chart for details).  Patient is likely suffering from some arthritis pain in his left hip consistent with issues he has had in the  past in the right side.  His initial triage heart rate was 146 but that is completely resolved and is somewhat inconsistent with the rest of his description.  He is very comfortable, lying in bed and watching TV.  He is laughing and joking with me and in no distress.  He has no reproducible tenderness to palpation, no evidence of infection, and his labs are reassuring.  I will evaluate with a CT renal stone protocol both to rule out kidney stones and to look for any obvious bony abnormalities.  I think he will likely be appropriate for follow-up with rheumatology and/or  orthopedics.   Clinical Course as of Sep 17 1922  Mon Sep 16, 2016  1905 I reviewed the patient's prescription history over the last 12 months in the multi-state controlled substances database(s) that includes [place], [place], [place], [place], [place], [place], [place], [place], [place], [place], [place], [place], [place], and [place].  The patient has filled no controlled substances during that time.   [CF]  1913 CT scan is unremarkable with the radiologist commenting on moderate osteoarthritis of the right hip and mild osteoarthritis of the left hip.  I explained the reassuring workup to the patient and encouraged him to follow up both with his rheumatologist as well as with orthopedics.  Since he does not typically take narcotics and he has a clear record and the drug database and is suffering from acute pain, I will give him a short course of Norco but I encouraged him to only use it only if Necessary and until He Can Follow up with His Doctors.  He Understands and Agrees with the Plan CT Renal [facility] [place] [CF]    Clinical Course User Index [CF] [name], MD    ____________________________________________  FINAL CLINICAL IMPRESSION(S) / ED DIAGNOSES  Final diagnoses:  Acute hip pain, left     MEDICATIONS GIVEN DURING THIS VISIT:  Medications  ondansetron (ZOFRAN) injection 4 mg (4 mg Intravenous Given 09/16/16 1448)  sodium chloride 0.9 % bolus 1,000 mL (1,000 mLs Intravenous New Bag/Given 09/16/16 1448)     NEW OUTPATIENT MEDICATIONS STARTED DURING THIS VISIT:  New Prescriptions   DOCUSATE SODIUM (COLACE) 100 MG CAPSULE    Take 1 tablet once or twice daily as needed for constipation while taking narcotic pain medicine   HYDROCODONE-ACETAMINOPHEN (NORCO/VICODIN) 5-325 MG TABLET    Take 1-2 tablets by mouth every 4 (four) hours as needed for moderate pain.    Modified Medications   No medications on file    Discontinued Medications    No medications on file     Note:  This document was prepared using Dragon voice recognition software and may include unintentional dictation errors.    [name], MD 09/16/16 1924

## 2016-09-16 NOTE — ED Triage Notes (Signed)
Pt c/o mid to lower back pain for over a week with N/v that started today.. Pt states he was seen here last week for similar sx.Marland Kitchen

## 2016-09-16 NOTE — Discharge Instructions (Signed)
As we discussed, your workup today was reassuring.  Though we do not know exactly what is causing your symptoms, it appears that you have no emergent medical condition at this time and that you are safe to go home and follow up as recommended in this paperwork. ° °Please return immediately to the Emergency Department if you develop any new or worsening symptoms that concern you. ° °

## 2016-10-09 ENCOUNTER — Encounter
Admission: RE | Admit: 2016-10-09 | Discharge: 2016-10-09 | Disposition: A | Discharge status: Home / Self Care | Payer: Medicaid Other | Source: Ambulatory Visit | Attending: Orthopedic Surgery | Admitting: Orthopedic Surgery

## 2016-10-09 DIAGNOSIS — Z01810 Encounter for preprocedural cardiovascular examination: Secondary | ICD-10-CM | POA: Insufficient documentation

## 2016-10-09 DIAGNOSIS — I10 Essential (primary) hypertension: Secondary | ICD-10-CM | POA: Diagnosis not present

## 2016-10-09 DIAGNOSIS — Z01812 Encounter for preprocedural laboratory examination: Secondary | ICD-10-CM | POA: Insufficient documentation

## 2016-10-09 HISTORY — DX: Nausea: R11.0

## 2016-10-09 HISTORY — DX: Hyperlipidemia, unspecified: E78.5

## 2016-10-09 HISTORY — DX: Essential (primary) hypertension: I10

## 2016-10-09 HISTORY — DX: Anxiety disorder, unspecified: F41.9

## 2016-10-09 HISTORY — DX: Cardiac arrhythmia, unspecified: I49.9

## 2016-10-09 HISTORY — DX: Dyspnea, unspecified: R06.00

## 2016-10-09 LAB — URINALYSIS, COMPLETE (UACMP) WITH MICROSCOPIC
Bacteria, UA: NONE SEEN
Bilirubin Urine: NEGATIVE
GLUCOSE, UA: NEGATIVE mg/dL
Hgb urine dipstick: NEGATIVE
Ketones, ur: NEGATIVE mg/dL
Leukocytes, UA: NEGATIVE
Nitrite: NEGATIVE
PH: 5 (ref 5.0–8.0)
Protein, ur: NEGATIVE mg/dL
RBC / HPF: NONE SEEN RBC/hpf (ref 0–5)
SPECIFIC GRAVITY, URINE: 1.016 (ref 1.005–1.030)
SQUAMOUS EPITHELIAL / LPF: NONE SEEN

## 2016-10-09 LAB — BASIC METABOLIC PANEL
ANION GAP: 10 (ref 5–15)
BUN: 19 mg/dL (ref 6–20)
CHLORIDE: 100 mmol/L — AB (ref 101–111)
CO2: 29 mmol/L (ref 22–32)
Calcium: 9.9 mg/dL (ref 8.9–10.3)
Creatinine, Ser: 1 mg/dL (ref 0.61–1.24)
GFR calc Af Amer: >60 mL/min (ref >60)
GFR calc non Af Amer: >60 mL/min (ref >60)
Glucose, Bld: 181 mg/dL — ABNORMAL HIGH (ref 65–99)
POTASSIUM: 3.9 mmol/L (ref 3.5–5.1)
Sodium: 139 mmol/L (ref 135–145)

## 2016-10-09 LAB — CBC
HEMATOCRIT: 41.3 % (ref 40.0–52.0)
HEMOGLOBIN: 13.7 g/dL (ref 13.0–18.0)
MCH: 29.2 pg (ref 26.0–34.0)
MCHC: 33.2 g/dL (ref 32.0–36.0)
MCV: 87.8 fL (ref 80.0–100.0)
Platelets: 328 10*3/uL (ref 150–440)
RBC: 4.71 MIL/uL (ref 4.40–5.90)
RDW: 15.3 % — ABNORMAL HIGH (ref 11.5–14.5)
WBC: 9.5 10*3/uL (ref 3.8–10.6)

## 2016-10-09 LAB — TYPE AND SCREEN
ABO/RH(D): O POS
ANTIBODY SCREEN: NEGATIVE

## 2016-10-09 LAB — PROTIME-INR
INR: 0.91
Prothrombin Time: 12.2 seconds (ref 11.4–15.2)

## 2016-10-09 LAB — APTT: APTT: 35 s (ref 24–36)

## 2016-10-09 LAB — SURGICAL PCR SCREEN
MRSA, PCR: NEGATIVE
Staphylococcus aureus: NEGATIVE

## 2016-10-09 LAB — SEDIMENTATION RATE: SED RATE: 63 mm/h — AB (ref 0–15)

## 2016-10-09 NOTE — Patient Instructions (Signed)
  Your procedure is scheduled on: 10/22/16 Tues Report to Same Day Surgery 2nd floor medical mall [facility] Entrance-take elevator on left to 2nd floor.  Check in with surgery information desk.) To find out your arrival time please call [phone] between 1PM - 3PM on 10/21/16 Mon Remember: Instructions that are not followed completely may result in serious medical risk, up to and including death, or upon the discretion of your surgeon and anesthesiologist your surgery may need to be rescheduled.    _x___ 1. Do not eat food or drink liquids after midnight. No gum chewing or    hard candies.     __x__ 2. No Alcohol for 24 hours before or after surgery.   __x__3. No Smoking for 24 prior to surgery.   ____  4. Bring all medications with you on the day of surgery if instructed.    __x__ 5. Notify your doctor if there is any change in your medical condition     (cold, fever, infections).     Do not wear jewelry, make-up, hairpins, clips or nail polish.  Do not wear lotions, powders, or perfumes. You may wear deodorant.  Do not shave 48 hours prior to surgery. Men may shave face and neck.  Do not bring valuables to the hospital.    [facility] is not responsible for any belongings or valuables.               Contacts, dentures or bridgework may not be worn into surgery.  Leave your suitcase in the car. After surgery it may be brought to your room.  For patients admitted to the hospital, discharge time is determined by your                       treatment team.   Patients discharged the day of surgery will not be allowed to drive home.  You will need someone to drive you home and stay with you the night of your procedure.    Please read over the following fact sheets that you were given:   [facility] Preparing for Surgery and or MRSA Information   _x___ Take anti-hypertensive (unless it includes a diuretic), cardiac, seizure, asthma,     anti-reflux and psychiatric medicines. These  include:  1. amLODipine (NORVASC)   2.lisinopril (PRINIVIL,ZESTRIL  3.metoprolol succinate   4.  5.  6.  ____Fleets enema or Magnesium Citrate as directed.   _x___ Use CHG Soap or sage wipes as directed on instruction sheet   ____ Use inhalers on the day of surgery and bring to hospital day of surgery  ____ Stop Metformin and Janumet 2 days prior to surgery.    ____ Take 1/2 of usual insulin dose the night before surgery and none on the morning     surgery.   _x___ Follow recommendations from Cardiologist, Pulmonologist or PCP regarding          stopping Aspirin, Coumadin, Pllavix ,Eliquis, Effient, or Pradaxa, and Pletal.  X____Stop Anti-inflammatories such as Advil, Aleve, Ibuprofen, Motrin, Naproxen, Naprosyn, Goodies powders or aspirin products. OK to take Tylenol and                          Celebrex.   _x___ Stop supplements until after surgery.  But may continue Vitamin D, Vitamin B,       and multivitamin.   ____ Bring C-Pap to the hospital.

## 2016-10-09 NOTE — Pre-Procedure Instructions (Signed)
EKG/LABS,AS REQUESTED BY DR P CARROLL, CALLED AND FAXED TO [place] AT [facility].CALLED TO DR [facility]

## 2016-10-10 LAB — URINE CULTURE: Culture: NO GROWTH

## 2016-10-14 DIAGNOSIS — M79606 Pain in leg, unspecified: Secondary | ICD-10-CM | POA: Insufficient documentation

## 2016-10-14 DIAGNOSIS — M25551 Pain in right hip: Secondary | ICD-10-CM | POA: Insufficient documentation

## 2016-10-16 NOTE — Pre-Procedure Instructions (Signed)
CLEARED BY DR Gwen Pounds 10/14/16

## 2016-10-21 MED ORDER — CEFAZOLIN SODIUM-DEXTROSE 2-4 GM/100ML-% IV SOLN
2.0000 g | Freq: Once | INTRAVENOUS | Status: AC
  Administered 2016-10-22: 2 g via INTRAVENOUS

## 2016-10-21 MED ORDER — TRANEXAMIC ACID 1000 MG/10ML IV SOLN
1000.0000 mg | INTRAVENOUS | Status: AC
  Administered 2016-10-22: 1000 mg via INTRAVENOUS
  Filled 2016-10-21: qty 10

## 2016-10-22 ENCOUNTER — Ambulatory Visit: Payer: Medicaid Other | Admitting: Anesthesiology

## 2016-10-22 ENCOUNTER — Encounter
Admission: RE | Disposition: A | Discharge status: Home / Self Care | Payer: Self-pay | Source: Ambulatory Visit | Attending: Orthopedic Surgery

## 2016-10-22 ENCOUNTER — Ambulatory Visit: Payer: Medicaid Other

## 2016-10-22 ENCOUNTER — Encounter: Payer: Self-pay | Admitting: *Deleted

## 2016-10-22 ENCOUNTER — Inpatient Hospital Stay
Admission: RE | Admit: 2016-10-22 | Discharge: 2016-10-24 | DRG: 470 | Disposition: A | Discharge status: Home / Self Care | Payer: Medicaid Other | Source: Ambulatory Visit | Attending: Orthopedic Surgery | Admitting: Orthopedic Surgery

## 2016-10-22 ENCOUNTER — Inpatient Hospital Stay: Payer: Medicaid Other

## 2016-10-22 DIAGNOSIS — M3500 Sicca syndrome, unspecified: Secondary | ICD-10-CM | POA: Diagnosis present

## 2016-10-22 DIAGNOSIS — M059 Rheumatoid arthritis with rheumatoid factor, unspecified: Secondary | ICD-10-CM | POA: Diagnosis present

## 2016-10-22 DIAGNOSIS — M167 Other unilateral secondary osteoarthritis of hip: Secondary | ICD-10-CM | POA: Diagnosis present

## 2016-10-22 DIAGNOSIS — I70209 Unspecified atherosclerosis of native arteries of extremities, unspecified extremity: Secondary | ICD-10-CM | POA: Diagnosis present

## 2016-10-22 DIAGNOSIS — Z419 Encounter for procedure for purposes other than remedying health state, unspecified: Secondary | ICD-10-CM

## 2016-10-22 DIAGNOSIS — F17200 Nicotine dependence, unspecified, uncomplicated: Secondary | ICD-10-CM | POA: Diagnosis present

## 2016-10-22 DIAGNOSIS — I10 Essential (primary) hypertension: Secondary | ICD-10-CM | POA: Diagnosis present

## 2016-10-22 DIAGNOSIS — G8918 Other acute postprocedural pain: Secondary | ICD-10-CM

## 2016-10-22 HISTORY — PX: TOTAL HIP ARTHROPLASTY: SHX124

## 2016-10-22 LAB — CBC
HCT: 35.4 % — ABNORMAL LOW (ref 40.0–52.0)
Hemoglobin: 11.9 g/dL — ABNORMAL LOW (ref 13.0–18.0)
MCH: 29.5 pg (ref 26.0–34.0)
MCHC: 33.4 g/dL (ref 32.0–36.0)
MCV: 88.2 fL (ref 80.0–100.0)
PLATELETS: 311 10*3/uL (ref 150–440)
RBC: 4.02 MIL/uL — AB (ref 4.40–5.90)
RDW: 15.6 % — AB (ref 11.5–14.5)
WBC: 8.9 10*3/uL (ref 3.8–10.6)

## 2016-10-22 LAB — CREATININE, SERUM
Creatinine, Ser: 1.07 mg/dL (ref 0.61–1.24)
GFR calc non Af Amer: >60 mL/min (ref >60)

## 2016-10-22 LAB — ABO/RH: ABO/RH(D): O POS

## 2016-10-22 IMAGING — XA DG HIP (WITH PELVIS) OPERATIVE*R*
2 series · 5 of 5 positions shown · non-contrast
Comparison: CT abdomen and pelvis [DATE].

CLINICAL DATA: Intraoperative imaging for right total hip
replacement.

EXAM:
OPERATIVE RIGHT HIP (WITH PELVIS IF PERFORMED) 2 VIEWS
TECHNIQUE: Fluoroscopic spot image(s) were submitted for interpretation
post-operatively.

[Series 1: ortho standard · 1 of 1 slices shown (1 of 2)]
[im 1/1]
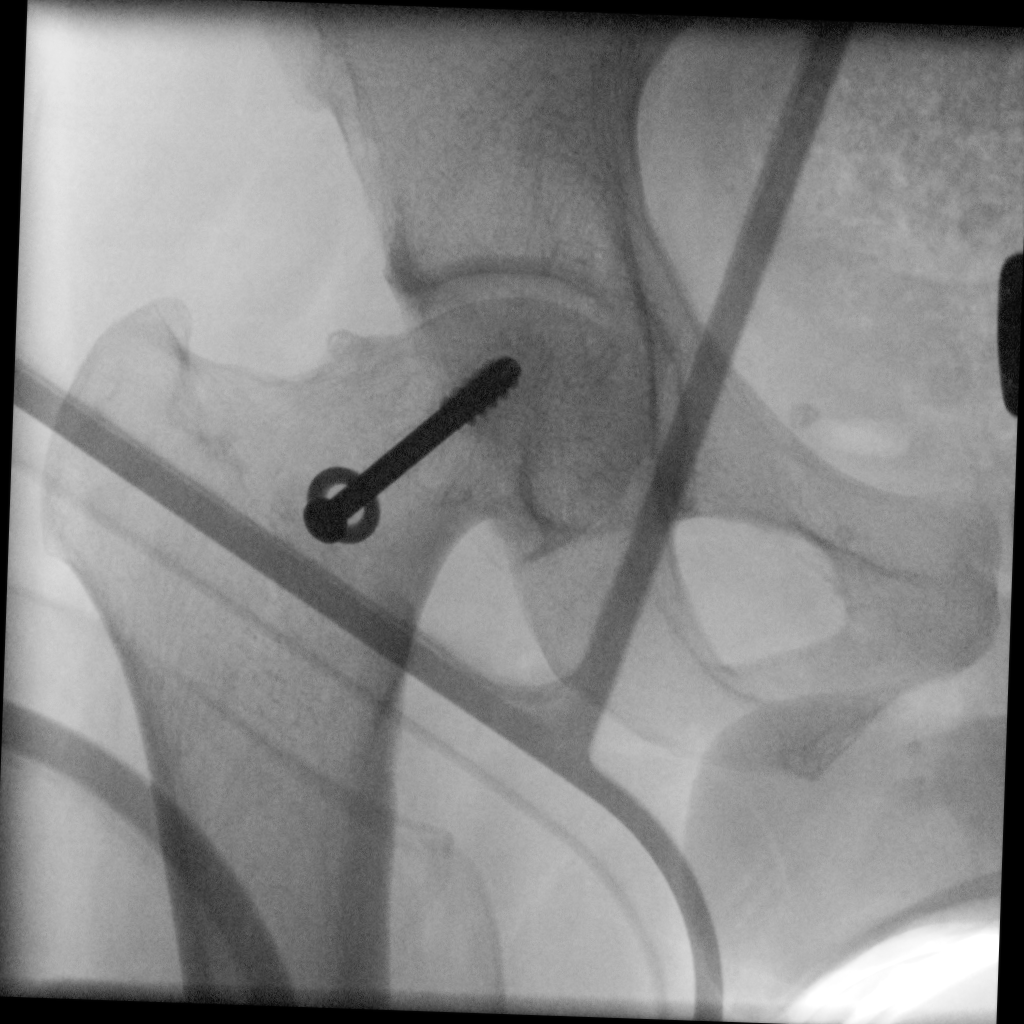

[Series 6: ortho standard · 4 of 9 frames shown (2 of 2)]
[frame 2/9]
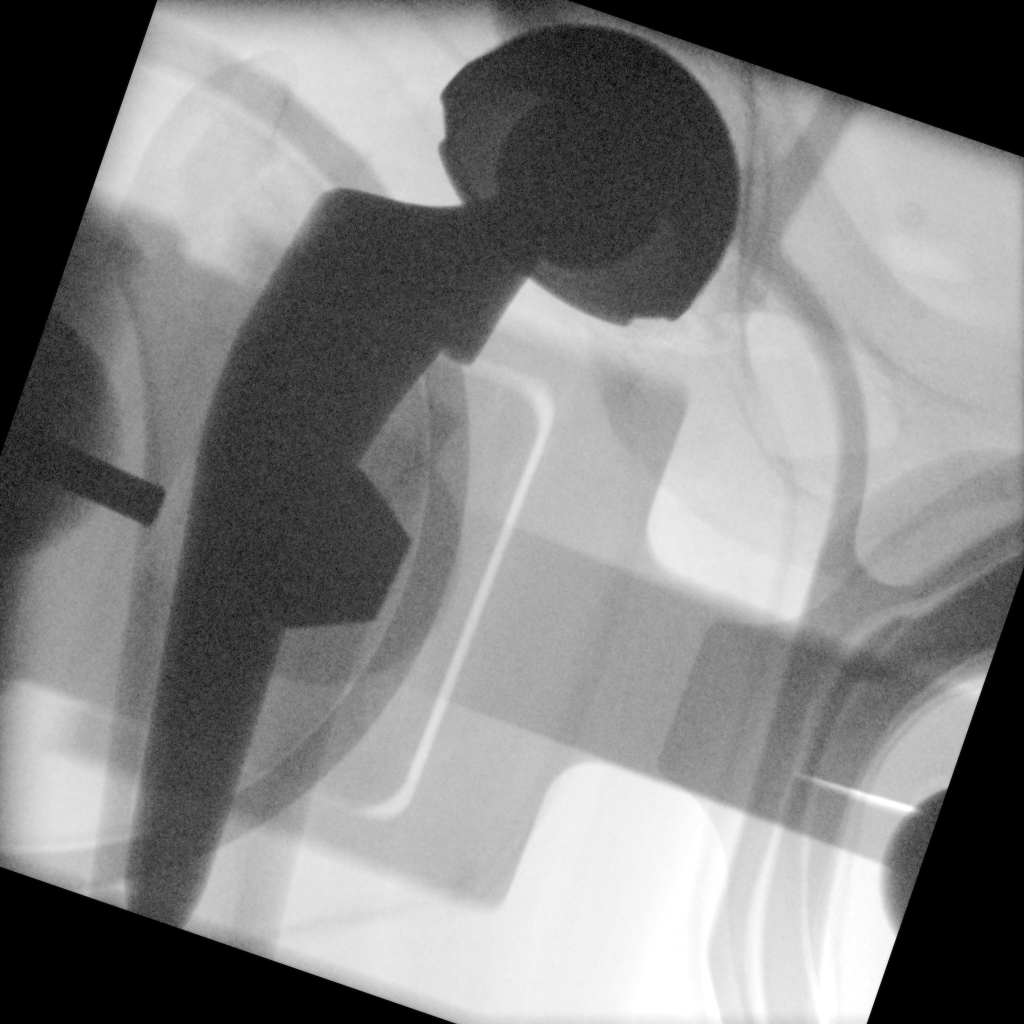
[frame 5/9]
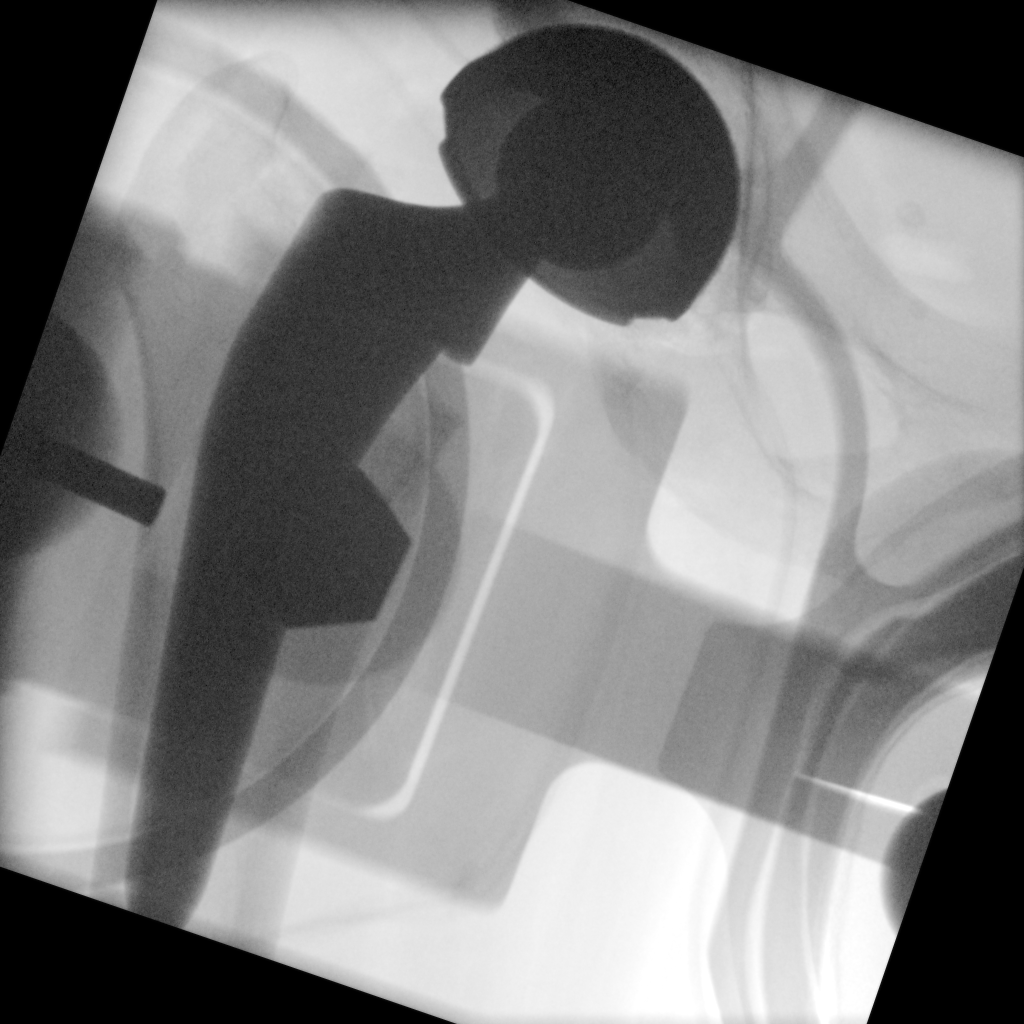
[frame 8/9]
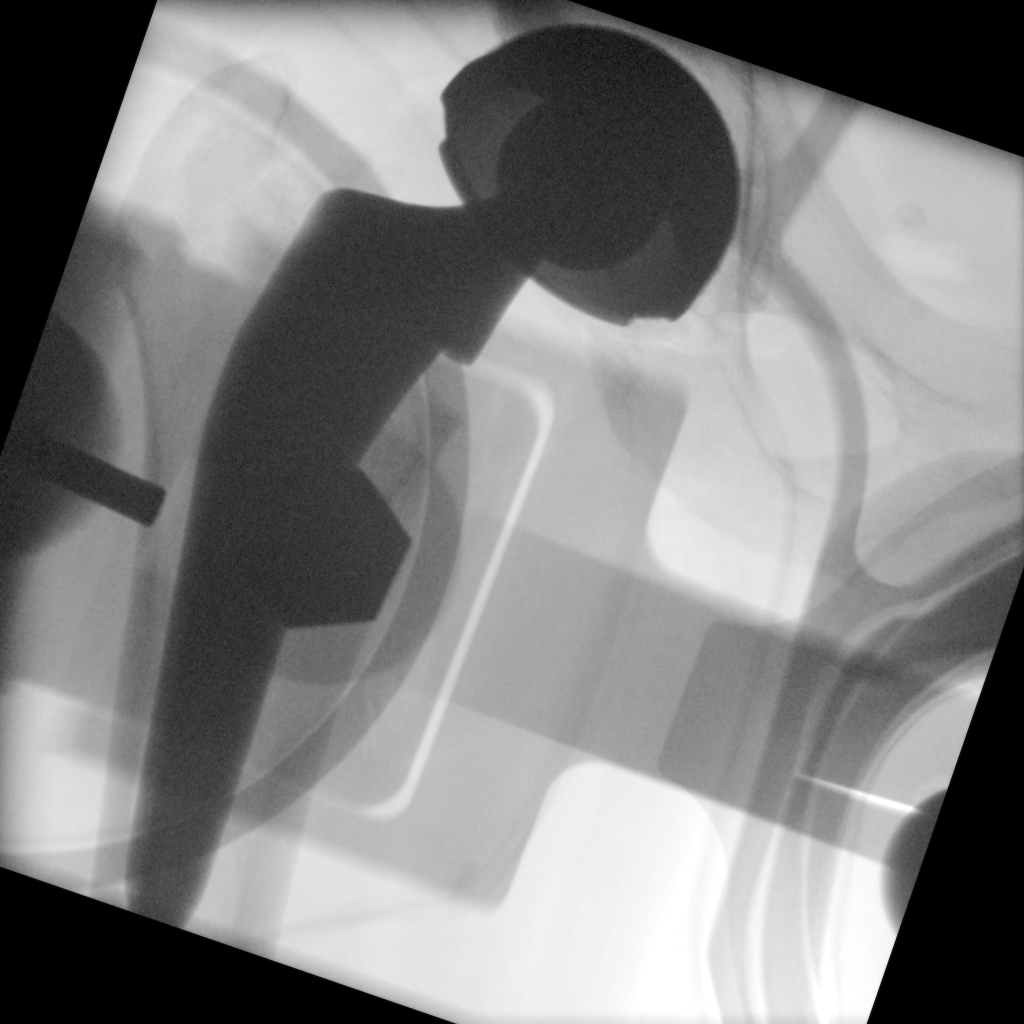
[frame 9/9]
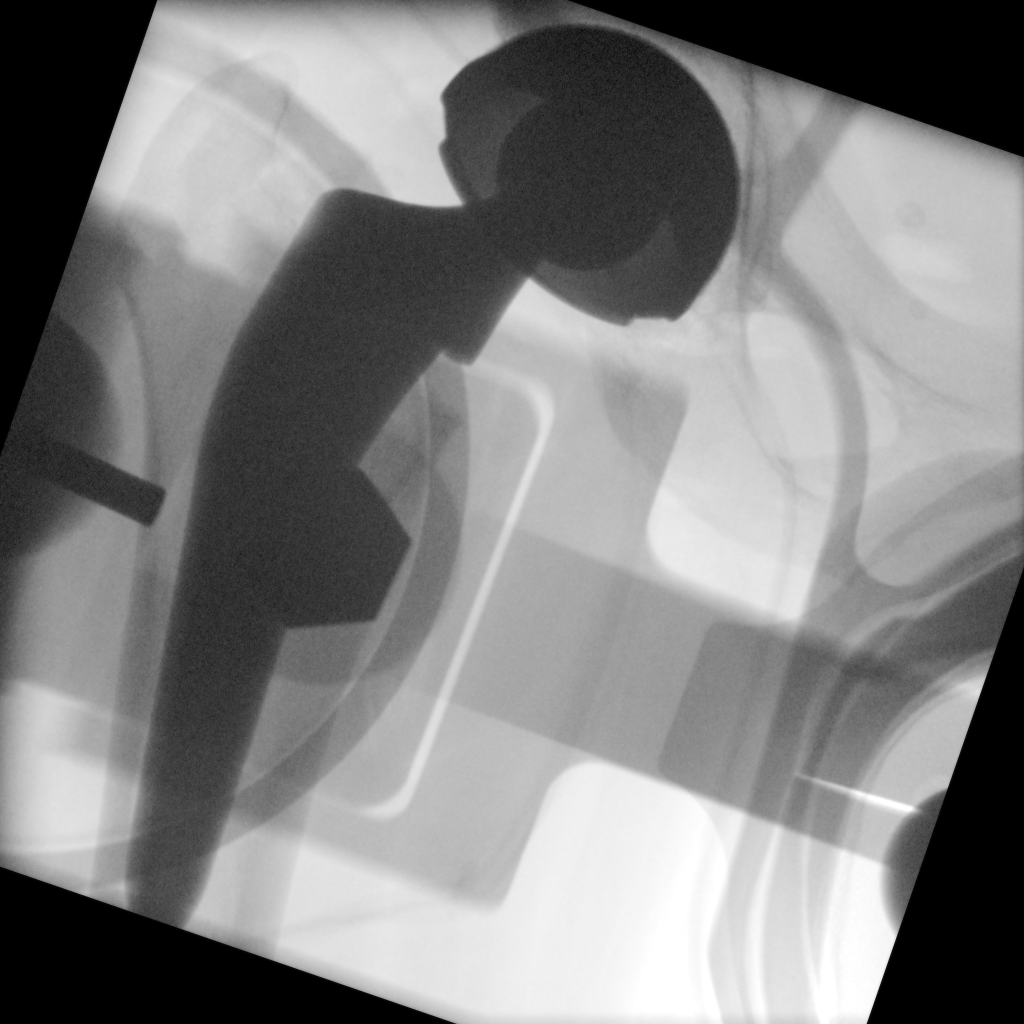

[5 of 5 positions shown; findings below may reference images not displayed]

FINDINGS: Two fluoroscopic intraoperative spot views of the right hip are
provided. On the first image, a single screw is in place across the
femoral neck as seen on the comparison examination. On the second
image, a screw has been removed and a right total hip arthroplasty
has been placed. The device is located. No acute abnormality is
seen.
IMPRESSION: Right total hip replacement.  No acute abnormality.

## 2016-10-22 IMAGING — DX DG HIP (WITH OR WITHOUT PELVIS) 2-3V*R*
2 series · 2 of 2 positions shown · non-contrast
Comparison: CT abdomen and pelvis [DATE] and intraoperative
spot views of the right hip this same day.

CLINICAL DATA: Status post right hip replacement today.

EXAM:
DG HIP (WITH OR WITHOUT PELVIS) 2-3V RIGHT

[hip ap]
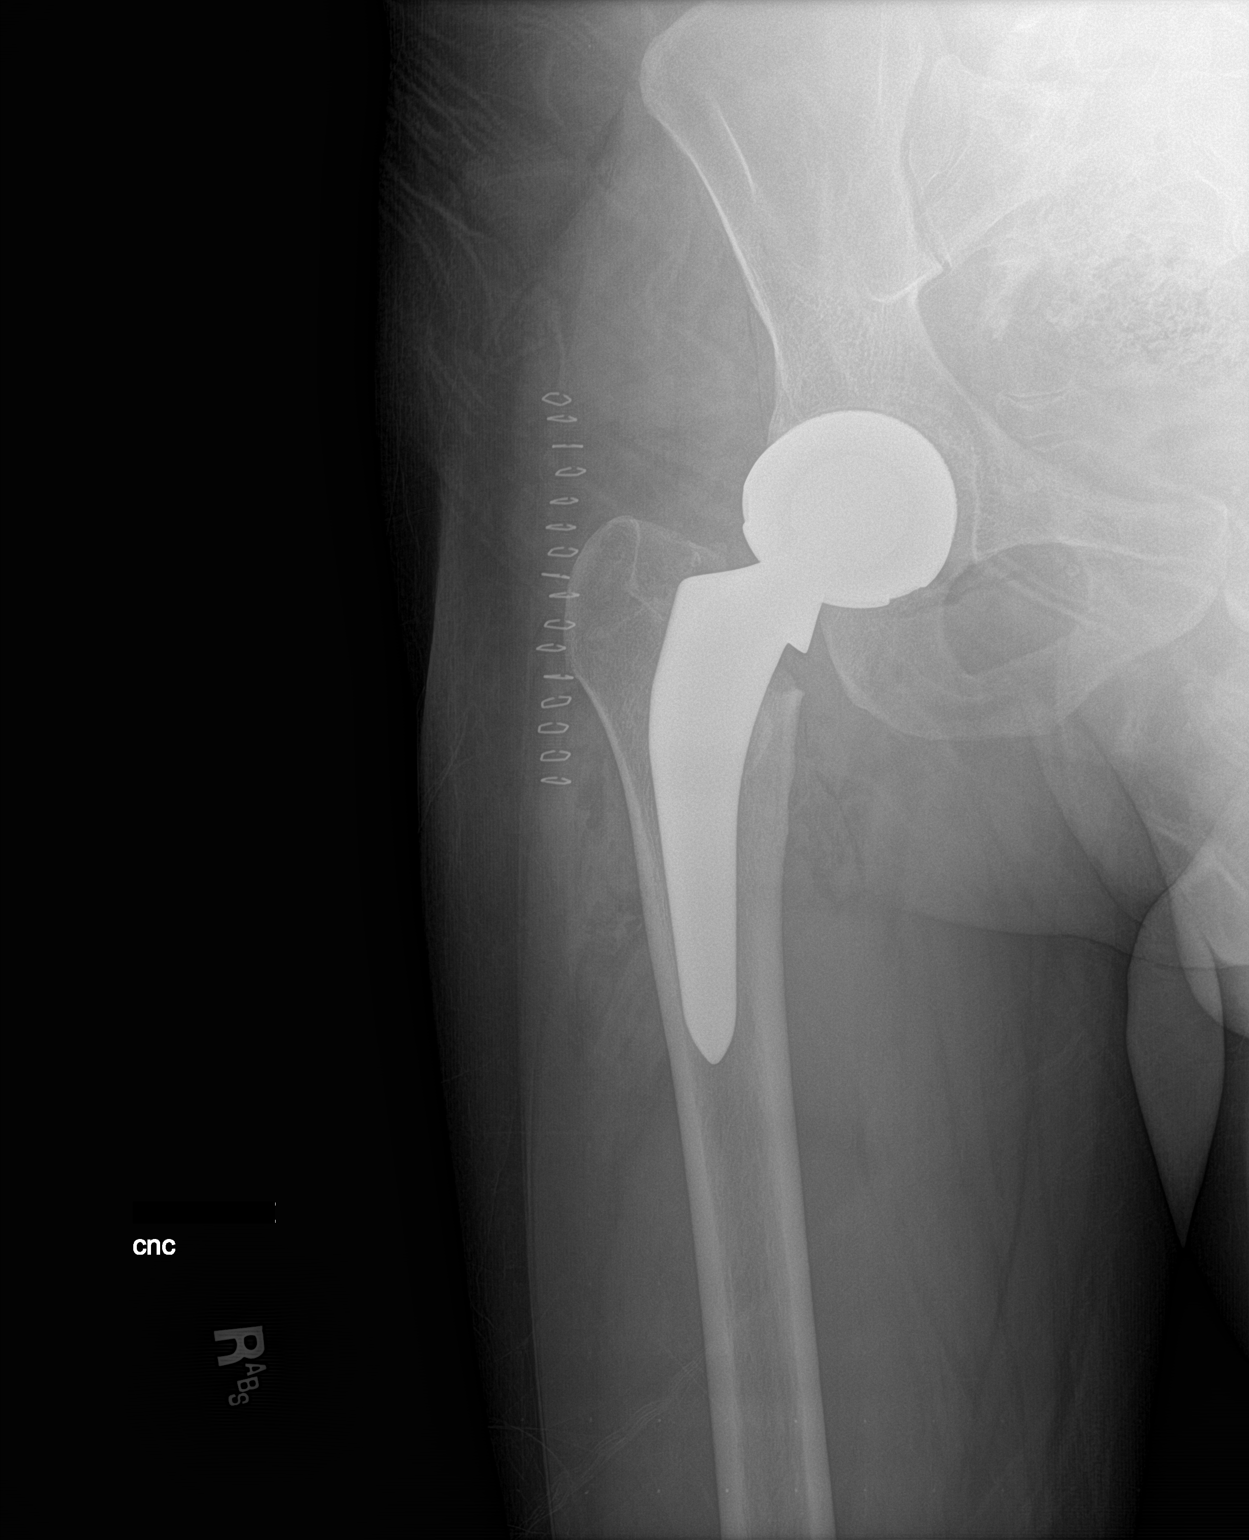

[hip lat]
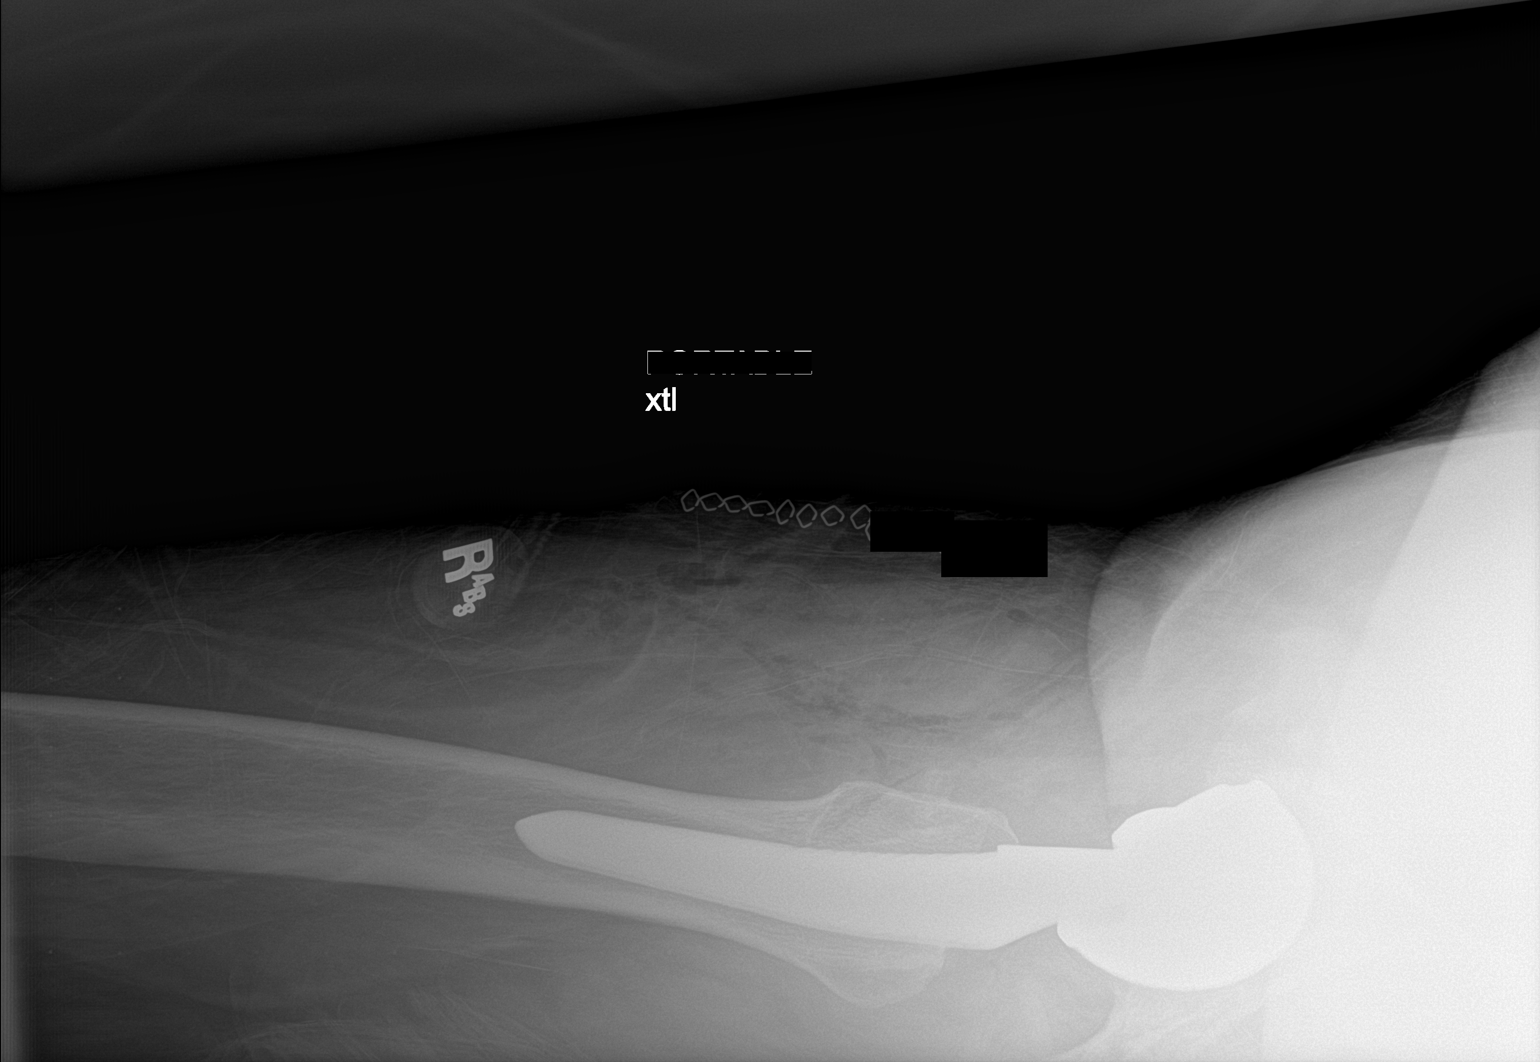

[2 of 2 positions shown; findings below may reference images not displayed]

FINDINGS: Right hip arthroplasty is in place. The device is located. No
fracture is identified. Surgical staples are noted.
IMPRESSION: Status post right hip replacement.  No acute abnormality.

## 2016-10-22 SURGERY — ARTHROPLASTY, HIP, TOTAL, ANTERIOR APPROACH
Anesthesia: Spinal | Laterality: Right | Wound class: Clean

## 2016-10-22 MED ORDER — MAGNESIUM CITRATE PO SOLN
1.0000 | Freq: Once | ORAL | Status: DC | PRN
  Filled 2016-10-22: qty 296

## 2016-10-22 MED ORDER — METOCLOPRAMIDE HCL 10 MG PO TABS: 5.0000 mg | ORAL_TABLET | Freq: Three times a day (TID) | ORAL | Status: DC | PRN

## 2016-10-22 MED ORDER — GLYCOPYRROLATE 0.2 MG/ML IJ SOLN
INTRAMUSCULAR | Status: DC | PRN
  Administered 2016-10-22: 0.2 mg via INTRAVENOUS

## 2016-10-22 MED ORDER — ONDANSETRON HCL 4 MG PO TABS: 4.0000 mg | ORAL_TABLET | Freq: Four times a day (QID) | ORAL | Status: DC | PRN

## 2016-10-22 MED ORDER — DIPHENHYDRAMINE HCL 12.5 MG/5ML PO ELIX: 12.5000 mg | ORAL_SOLUTION | ORAL | Status: DC | PRN

## 2016-10-22 MED ORDER — PROMETHAZINE HCL 25 MG/ML IJ SOLN: 6.2500 mg | INTRAMUSCULAR | Status: DC | PRN

## 2016-10-22 MED ORDER — TRANEXAMIC ACID 1000 MG/10ML IV SOLN
1000.0000 mg | Freq: Once | INTRAVENOUS | Status: AC
  Administered 2016-10-22: 1000 mg via INTRAVENOUS
  Filled 2016-10-22: qty 10

## 2016-10-22 MED ORDER — MIDAZOLAM HCL 2 MG/2ML IJ SOLN
INTRAMUSCULAR | Status: AC
  Filled 2016-10-22: qty 2

## 2016-10-22 MED ORDER — SODIUM CHLORIDE 0.9 % IV SOLN
INTRAVENOUS | Status: DC
  Administered 2016-10-22: 14:00:00 via INTRAVENOUS

## 2016-10-22 MED ORDER — PROPOFOL 500 MG/50ML IV EMUL
INTRAVENOUS | Status: AC
  Filled 2016-10-22: qty 50

## 2016-10-22 MED ORDER — LISINOPRIL 20 MG PO TABS
40.0000 mg | ORAL_TABLET | Freq: Every day | ORAL | Status: DC
  Administered 2016-10-23: 40 mg via ORAL
  Filled 2016-10-22 (×2): qty 2

## 2016-10-22 MED ORDER — FAMOTIDINE 20 MG PO TABS
20.0000 mg | ORAL_TABLET | Freq: Once | ORAL | Status: AC
  Administered 2016-10-22: 20 mg via ORAL

## 2016-10-22 MED ORDER — SODIUM CHLORIDE 0.9 % IV SOLN
INTRAVENOUS | Status: DC | PRN
  Administered 2016-10-22: 50 ug/min via INTRAVENOUS
  Administered 2016-10-22: 200 ug/min via INTRAVENOUS

## 2016-10-22 MED ORDER — TRAZODONE HCL 100 MG PO TABS: 100.0000 mg | ORAL_TABLET | Freq: Every evening | ORAL | Status: DC | PRN

## 2016-10-22 MED ORDER — PROPOFOL 500 MG/50ML IV EMUL
INTRAVENOUS | Status: DC | PRN
  Administered 2016-10-22: 150 ug/kg/min via INTRAVENOUS

## 2016-10-22 MED ORDER — ATORVASTATIN CALCIUM 20 MG PO TABS
40.0000 mg | ORAL_TABLET | Freq: Every evening | ORAL | Status: DC
  Administered 2016-10-22 – 2016-10-23 (×2): 40 mg via ORAL
  Filled 2016-10-22 (×2): qty 2

## 2016-10-22 MED ORDER — PHENYLEPHRINE HCL 10 MG/ML IJ SOLN
INTRAMUSCULAR | Status: DC | PRN
  Administered 2016-10-22 (×6): 100 ug via INTRAVENOUS

## 2016-10-22 MED ORDER — NEOMYCIN-POLYMYXIN B GU 40-200000 IR SOLN
Status: DC | PRN
  Administered 2016-10-22: 4 mL

## 2016-10-22 MED ORDER — HYDROCHLOROTHIAZIDE 25 MG PO TABS
25.0000 mg | ORAL_TABLET | Freq: Every day | ORAL | Status: DC
  Administered 2016-10-23: 25 mg via ORAL
  Filled 2016-10-22 (×2): qty 1

## 2016-10-22 MED ORDER — ZOLPIDEM TARTRATE 5 MG PO TABS: 5.0000 mg | ORAL_TABLET | Freq: Every evening | ORAL | Status: DC | PRN

## 2016-10-22 MED ORDER — VITAMIN D (ERGOCALCIFEROL) 1.25 MG (50000 UNIT) PO CAPS
50000.0000 [IU] | ORAL_CAPSULE | ORAL | Status: DC
  Administered 2016-10-23: 50000 [IU] via ORAL
  Filled 2016-10-22: qty 1

## 2016-10-22 MED ORDER — LACTATED RINGERS IV SOLN
INTRAVENOUS | Status: DC | PRN
  Administered 2016-10-22 (×2): via INTRAVENOUS

## 2016-10-22 MED ORDER — ENOXAPARIN SODIUM 40 MG/0.4ML ~~LOC~~ SOLN
40.0000 mg | SUBCUTANEOUS | Status: DC
  Administered 2016-10-23 – 2016-10-24 (×2): 40 mg via SUBCUTANEOUS
  Filled 2016-10-22 (×2): qty 0.4

## 2016-10-22 MED ORDER — PHENOL 1.4 % MT LIQD
1.0000 | OROMUCOSAL | Status: DC | PRN
  Filled 2016-10-22: qty 177

## 2016-10-22 MED ORDER — FENTANYL CITRATE (PF) 100 MCG/2ML IJ SOLN: 25.0000 ug | INTRAMUSCULAR | Status: DC | PRN

## 2016-10-22 MED ORDER — LACTATED RINGERS IV SOLN
INTRAVENOUS | Status: DC
  Administered 2016-10-22: 09:00:00 via INTRAVENOUS

## 2016-10-22 MED ORDER — OXYCODONE HCL 5 MG PO TABS
5.0000 mg | ORAL_TABLET | ORAL | Status: DC | PRN
  Administered 2016-10-22 (×3): 10 mg via ORAL
  Administered 2016-10-23: 5 mg via ORAL
  Administered 2016-10-23 – 2016-10-24 (×5): 10 mg via ORAL
  Filled 2016-10-22 (×2): qty 2
  Filled 2016-10-22 (×2): qty 1
  Filled 2016-10-22 (×6): qty 2

## 2016-10-22 MED ORDER — MENTHOL 3 MG MT LOZG
1.0000 | LOZENGE | OROMUCOSAL | Status: DC | PRN
  Filled 2016-10-22: qty 9

## 2016-10-22 MED ORDER — BUPIVACAINE HCL (PF) 0.5 % IJ SOLN
INTRAMUSCULAR | Status: DC | PRN
  Administered 2016-10-22: 3 mL

## 2016-10-22 MED ORDER — BISACODYL 10 MG RE SUPP: 10.0000 mg | Freq: Every day | RECTAL | Status: DC | PRN

## 2016-10-22 MED ORDER — MIDAZOLAM HCL 5 MG/5ML IJ SOLN
INTRAMUSCULAR | Status: DC | PRN
  Administered 2016-10-22: 2 mg via INTRAVENOUS

## 2016-10-22 MED ORDER — BUPIVACAINE-EPINEPHRINE (PF) 0.25% -1:200000 IJ SOLN
INTRAMUSCULAR | Status: AC
  Filled 2016-10-22: qty 30

## 2016-10-22 MED ORDER — FOLIC ACID 1 MG PO TABS
1.0000 mg | ORAL_TABLET | Freq: Every evening | ORAL | Status: DC
  Administered 2016-10-22 – 2016-10-23 (×2): 1 mg via ORAL
  Filled 2016-10-22 (×2): qty 1

## 2016-10-22 MED ORDER — OXYCODONE HCL 5 MG PO TABS: 5.0000 mg | ORAL_TABLET | Freq: Once | ORAL | Status: DC | PRN

## 2016-10-22 MED ORDER — ONDANSETRON HCL 4 MG/2ML IJ SOLN
INTRAMUSCULAR | Status: AC
  Filled 2016-10-22: qty 2

## 2016-10-22 MED ORDER — METOCLOPRAMIDE HCL 5 MG/ML IJ SOLN: 5.0000 mg | Freq: Three times a day (TID) | INTRAMUSCULAR | Status: DC | PRN

## 2016-10-22 MED ORDER — MORPHINE SULFATE (PF) 2 MG/ML IV SOLN
2.0000 mg | INTRAVENOUS | Status: DC | PRN
  Administered 2016-10-22 (×5): 2 mg via INTRAVENOUS
  Filled 2016-10-22 (×5): qty 1

## 2016-10-22 MED ORDER — ONDANSETRON HCL 4 MG/2ML IJ SOLN
INTRAMUSCULAR | Status: DC | PRN
  Administered 2016-10-22: 4 mg via INTRAVENOUS

## 2016-10-22 MED ORDER — OXYCODONE HCL 5 MG/5ML PO SOLN: 5.0000 mg | Freq: Once | ORAL | Status: DC | PRN

## 2016-10-22 MED ORDER — ONDANSETRON HCL 4 MG/2ML IJ SOLN: 4.0000 mg | Freq: Four times a day (QID) | INTRAMUSCULAR | Status: DC | PRN

## 2016-10-22 MED ORDER — GLYCOPYRROLATE 0.2 MG/ML IJ SOLN
INTRAMUSCULAR | Status: AC
  Filled 2016-10-22: qty 1

## 2016-10-22 MED ORDER — BUPIVACAINE HCL (PF) 0.5 % IJ SOLN
INTRAMUSCULAR | Status: AC
Start: 2016-10-22 — End: 2016-10-22
  Filled 2016-10-22: qty 10

## 2016-10-22 MED ORDER — CEFAZOLIN SODIUM-DEXTROSE 2-4 GM/100ML-% IV SOLN
INTRAVENOUS | Status: AC
  Filled 2016-10-22: qty 100

## 2016-10-22 MED ORDER — METOPROLOL SUCCINATE ER 50 MG PO TB24
100.0000 mg | ORAL_TABLET | Freq: Every day | ORAL | Status: DC
  Administered 2016-10-23 – 2016-10-24 (×2): 100 mg via ORAL
  Filled 2016-10-22 (×2): qty 2

## 2016-10-22 MED ORDER — METHOCARBAMOL 500 MG PO TABS
500.0000 mg | ORAL_TABLET | Freq: Four times a day (QID) | ORAL | Status: DC | PRN
  Administered 2016-10-22: 500 mg via ORAL
  Filled 2016-10-22: qty 1

## 2016-10-22 MED ORDER — PROPOFOL 10 MG/ML IV BOLUS
INTRAVENOUS | Status: DC | PRN
  Administered 2016-10-22: 50 mg via INTRAVENOUS

## 2016-10-22 MED ORDER — AMLODIPINE BESYLATE 10 MG PO TABS
10.0000 mg | ORAL_TABLET | Freq: Every evening | ORAL | Status: DC
  Administered 2016-10-22 – 2016-10-23 (×2): 10 mg via ORAL
  Filled 2016-10-22 (×2): qty 1

## 2016-10-22 MED ORDER — MAGNESIUM HYDROXIDE 400 MG/5ML PO SUSP
30.0000 mL | Freq: Every day | ORAL | Status: DC | PRN
  Administered 2016-10-24: 30 mL via ORAL
  Filled 2016-10-22 (×2): qty 30

## 2016-10-22 MED ORDER — NEOMYCIN-POLYMYXIN B GU 40-200000 IR SOLN
Status: AC
  Filled 2016-10-22: qty 4

## 2016-10-22 MED ORDER — DOCUSATE SODIUM 100 MG PO CAPS
100.0000 mg | ORAL_CAPSULE | Freq: Two times a day (BID) | ORAL | Status: DC
  Administered 2016-10-22 – 2016-10-24 (×4): 100 mg via ORAL
  Filled 2016-10-22 (×4): qty 1

## 2016-10-22 MED ORDER — METHOCARBAMOL 1000 MG/10ML IJ SOLN
500.0000 mg | Freq: Four times a day (QID) | INTRAVENOUS | Status: DC | PRN
  Filled 2016-10-22: qty 5

## 2016-10-22 MED ORDER — ACETAMINOPHEN 650 MG RE SUPP: 650.0000 mg | Freq: Four times a day (QID) | RECTAL | Status: DC | PRN

## 2016-10-22 MED ORDER — BUPIVACAINE-EPINEPHRINE 0.25% -1:200000 IJ SOLN
INTRAMUSCULAR | Status: DC | PRN
  Administered 2016-10-22: 30 mL

## 2016-10-22 MED ORDER — MECLIZINE HCL 25 MG PO TABS: 25.0000 mg | ORAL_TABLET | Freq: Three times a day (TID) | ORAL | Status: DC | PRN

## 2016-10-22 MED ORDER — ACETAMINOPHEN 325 MG PO TABS
650.0000 mg | ORAL_TABLET | Freq: Four times a day (QID) | ORAL | Status: DC | PRN
  Administered 2016-10-22: 650 mg via ORAL
  Filled 2016-10-22: qty 2

## 2016-10-22 MED ORDER — PROPOFOL 10 MG/ML IV BOLUS
INTRAVENOUS | Status: AC
  Filled 2016-10-22: qty 20

## 2016-10-22 MED ORDER — DEXTROSE 5 % IV SOLN
2.0000 g | Freq: Four times a day (QID) | INTRAVENOUS | Status: AC
  Administered 2016-10-22 – 2016-10-23 (×3): 2 g via INTRAVENOUS
  Filled 2016-10-22 (×3): qty 2000

## 2016-10-22 MED ORDER — FAMOTIDINE 20 MG PO TABS
ORAL_TABLET | ORAL | Status: AC
  Administered 2016-10-22: 20 mg via ORAL
  Filled 2016-10-22: qty 1

## 2016-10-22 MED ORDER — MEPERIDINE HCL 50 MG/ML IJ SOLN: 6.2500 mg | INTRAMUSCULAR | Status: DC | PRN

## 2016-10-22 MED ORDER — EPHEDRINE SULFATE 50 MG/ML IJ SOLN
INTRAMUSCULAR | Status: DC | PRN
  Administered 2016-10-22: 10 mg via INTRAVENOUS

## 2016-10-22 SURGICAL SUPPLY — 46 items
BLADE SAW SAG 18.5X105 (BLADE) ×3 IMPLANT
BNDG COHESIVE 6X5 TAN STRL LF (GAUZE/BANDAGES/DRESSINGS) ×6 IMPLANT
CANISTER SUCT 1200ML W/VALVE (MISCELLANEOUS) ×3 IMPLANT
CAPT HIP TOTAL 3 ×3 IMPLANT
CATH FOL LEG HOLDER (MISCELLANEOUS) ×3 IMPLANT
CATH TRAY METER 16FR LF (MISCELLANEOUS) ×3 IMPLANT
CHLORAPREP W/TINT 26ML (MISCELLANEOUS) ×3 IMPLANT
DRAPE C-ARM XRAY 36X54 (DRAPES) ×3 IMPLANT
DRAPE INCISE IOBAN 66X60 STRL (DRAPES) IMPLANT
DRAPE POUCH INSTRU U-SHP 10X18 (DRAPES) ×3 IMPLANT
DRAPE SHEET LG 3/4 BI-LAMINATE (DRAPES) ×9 IMPLANT
DRAPE TABLE BACK 80X90 (DRAPES) ×3 IMPLANT
DRESSING SURGICEL FIBRLLR 1X2 (HEMOSTASIS) ×2 IMPLANT
DRSG OPSITE POSTOP 4X8 (GAUZE/BANDAGES/DRESSINGS) ×6 IMPLANT
DRSG SURGICEL FIBRILLAR 1X2 (HEMOSTASIS) ×6
ELECT BLADE 6.5 EXT (BLADE) ×3 IMPLANT
ELECT REM PT RETURN 9FT ADLT (ELECTROSURGICAL) ×3
ELECTRODE REM PT RTRN 9FT ADLT (ELECTROSURGICAL) ×1 IMPLANT
GLOVE BIOGEL PI IND STRL 9 (GLOVE) ×1 IMPLANT
GLOVE BIOGEL PI INDICATOR 9 (GLOVE) ×2
GLOVE SURG SYN 9.0  PF PI (GLOVE) ×4
GLOVE SURG SYN 9.0 PF PI (GLOVE) ×2 IMPLANT
GOWN SRG 2XL LVL 4 RGLN SLV (GOWNS) ×1 IMPLANT
GOWN STRL NON-REIN 2XL LVL4 (GOWNS) ×2
GOWN STRL REUS W/ TWL LRG LVL3 (GOWN DISPOSABLE) ×1 IMPLANT
GOWN STRL REUS W/TWL LRG LVL3 (GOWN DISPOSABLE) ×2
HEMOVAC 400CC 10FR (MISCELLANEOUS) IMPLANT
HOOD PEEL AWAY FLYTE STAYCOOL (MISCELLANEOUS) ×3 IMPLANT
MAT BLUE FLOOR 46X72 FLO (MISCELLANEOUS) ×3 IMPLANT
NDL SAFETY 18GX1.5 (NEEDLE) ×3 IMPLANT
NEEDLE SPNL 18GX3.5 QUINCKE PK (NEEDLE) ×3 IMPLANT
NS IRRIG 1000ML POUR BTL (IV SOLUTION) ×3 IMPLANT
PACK HIP COMPR (MISCELLANEOUS) ×3 IMPLANT
SOL PREP PVP 2OZ (MISCELLANEOUS) ×3
SOLUTION PREP PVP 2OZ (MISCELLANEOUS) ×1 IMPLANT
STAPLER SKIN PROX 35W (STAPLE) ×3 IMPLANT
STRAP SAFETY BODY (MISCELLANEOUS) ×3 IMPLANT
SUT DVC 2 QUILL PDO  T11 36X36 (SUTURE) ×2
SUT DVC 2 QUILL PDO T11 36X36 (SUTURE) ×1 IMPLANT
SUT SILK 0 (SUTURE) ×2
SUT SILK 0 30XBRD TIE 6 (SUTURE) ×1 IMPLANT
SUT V-LOC 90 ABS DVC 3-0 CL (SUTURE) ×3 IMPLANT
SUT VIC AB 1 CT1 36 (SUTURE) ×3 IMPLANT
SYR 20CC LL (SYRINGE) ×3 IMPLANT
SYR 30ML LL (SYRINGE) ×3 IMPLANT
TOWEL OR 17X26 4PK STRL BLUE (TOWEL DISPOSABLE) ×3 IMPLANT

## 2016-10-22 NOTE — Progress Notes (Signed)
15 minute call given to floor. 

## 2016-10-22 NOTE — Progress Notes (Signed)
Patient has oral airway present, noisy sounding respirations, suctioned and holding jawlift.

## 2016-10-22 NOTE — Progress Notes (Signed)
Dr. Priscella Mann at bedside, patient remains hard To arouse, can swallow on his own, coughs from Time to time, she placed nasal airway.  Improved Immediately.  Oxygen saturation 100 percent.

## 2016-10-22 NOTE — Anesthesia Procedure Notes (Addendum)
Spinal  Patient location during procedure: OR Start time: 10/22/2016 10:05 AM End time: 10/22/2016 10:17 AM Staffing Anesthesiologist: Randa Lynn AMY Resident/CRNA: Nelda Marseille Performed: resident/CRNA and anesthesiologist  Preanesthetic Checklist Completed: patient identified, site marked, surgical consent, pre-op evaluation, timeout performed, IV checked, risks and benefits discussed and monitors and equipment checked Spinal Block Patient position: sitting Prep: ChloraPrep Patient monitoring: heart rate, continuous pulse ox, blood pressure and cardiac monitor Approach: midline Location: L4-5 Injection technique: single-shot Needle Needle type: Introducer and Pencil-Tip  Needle gauge: 24 G Needle length: 9 cm Additional Notes Negative paresthesia. Negative blood return. Positive free-flowing CSF. Expiration date of kit checked and confirmed. Patient tolerated procedure well, without complications.

## 2016-10-22 NOTE — Progress Notes (Signed)
Patient restless, attempting to pull his oxygen mask Off, nasal trumpet removed.

## 2016-10-22 NOTE — Transfer of Care (Signed)
Immediate Anesthesia Transfer of Care Note  Patient: Jeffrey Grant  Procedure(s) Performed: Procedure(s): TOTAL HIP ARTHROPLASTY ANTERIOR APPROACH (Right)  Patient Location: PACU  Anesthesia Type:Spinal  Level of Consciousness: sedated  Airway & Oxygen Therapy: Patient Spontanous Breathing and Patient connected to face mask oxygen  Post-op Assessment: Report given to RN and Post -op Vital signs reviewed and stable  Post vital signs: Reviewed and stable  Last Vitals:  Vitals:   10/22/16 0801  BP: (!) 148/97  Pulse: 79  Resp: 20  Temp: (!) 35.3 C    Last Pain:  Vitals:   10/22/16 0801  TempSrc: Tympanic         Complications: No apparent anesthesia complications

## 2016-10-22 NOTE — Anesthesia Preprocedure Evaluation (Signed)
Anesthesia Evaluation  Patient identified by MRN, date of birth, ID band Patient awake    Reviewed: Allergy & Precautions, NPO status , Patient's Chart, lab work & pertinent test results  History of Anesthesia Complications Negative for: history of anesthetic complications  Airway Mallampati: III  TM Distance: >3 FB Neck ROM: Full    Dental no notable dental hx.    Pulmonary neg sleep apnea (snores, but not diagnosed with OSA), neg COPD, Current Smoker,    breath sounds clear to auscultation- rhonchi (-) wheezing      Cardiovascular hypertension, Pt. on medications + Peripheral Vascular Disease  (-) CAD, (-) Past MI and (-) Cardiac Stents  Rhythm:Regular Rate:Normal - Systolic murmurs and - Diastolic murmurs    Neuro/Psych Anxiety    GI/Hepatic negative GI ROS, Neg liver ROS,   Endo/Other  negative endocrine ROSneg diabetes  Renal/GU negative Renal ROS     Musculoskeletal  (+) Arthritis , Rheumatoid disorders,    Abdominal (+) - obese,   Peds  Hematology negative hematology ROS (+)   Anesthesia Other Findings Past Medical History: No date: Anxiety No date: Atherosclerotic PVD with intermittent claudica* No date: Back pain No date: Dyspnea No date: Dysrhythmia No date: Elevated lipids No date: Hypertension No date: Nausea No date: Seropositive rheumatoid arthritis (HCC) No date: Sjogren's syndrome (HCC)   Reproductive/Obstetrics                             Anesthesia Physical Anesthesia Plan  ASA: II  Anesthesia Plan: Spinal   Post-op Pain Management:    Induction:   Airway Management Planned: Natural Airway  Additional Equipment:   Intra-op Plan:   Post-operative Plan:   Informed Consent: I have reviewed the patients History and Physical, chart, labs and discussed the procedure including the risks, benefits and alternatives for the proposed anesthesia with the patient  or authorized representative who has indicated his/her understanding and acceptance.   Dental advisory given  Plan Discussed with: Anesthesiologist and CRNA  Anesthesia Plan Comments:         Lab Results  Component Value Date   WBC 9.5 10/09/2016   HGB 13.7 10/09/2016   HCT 41.3 10/09/2016   MCV 87.8 10/09/2016   PLT 328 10/09/2016    Anesthesia Quick Evaluation

## 2016-10-22 NOTE — Op Note (Signed)
10/22/2016  12:17 PM  PATIENT:  Jeffrey Grant  36 y.o. 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  BLOOD ADMINISTERED:none  DRAINS: none   LOCAL MEDICATIONS USED:  MARCAINE     SPECIMEN:  Source of Specimen:  Right femoral head  DISPOSITION OF SPECIMEN:  PATHOLOGY  COUNTS:  YES  TOURNIQUET:  * No tourniquets in log *  IMPLANTS: Medacta Amis for standard collared stem with S ceramic head 28 mm and liner for 52 mm Mpact DM cup as well as cup  DICTATION: .Dragon Dictation   The patient was brought to the operating room and after spinal anesthesia was obtained patient was placed on the operative table with the ipsilateral foot into the Medacta attachment, contralateral leg on a well-padded table. C-arm was brought in and preop template x-ray taken. After prepping and draping in usual sterile fashion appropriate patient identification and timeout procedures were completed. Anterior approach to the hip was obtained and centered over the greater trochanter and TFL muscle. The subcutaneous tissue was incised hemostasis being achieved by electrocautery. TFL fascia was incised and the muscle retracted laterally deep retractor placed. The lateral femoral circumflex vessels were identified and ligated. The anterior capsule was exposed and a capsulotomy performed. The neck was identified, and the prior screw and washer were identified and removed at this point with 4.5 cannulated screw with washer removed. This was to be clean dense given to the patient was done incidental to the procedure. The femoral neck cut carried out with a saw. The head was removed without difficulty and showed sclerotic femoral head and acetabulum.  Reaming was carried out to 50 mm and a 52 mm cup trial gave appropriate tightness to the acetabular component a 52 DM` cup was impacted into position. The leg was then externally rotated and ischiofemoral and patellofemoral releases carried out. The femur was sequentially broached to a size 4, size 4 standard with S head trials were placed and the final components chosen. The 4 standard stem was inserted along with a S Biolox 28 mm head and 52 mm liner. The hip was reduced and was stable the wound was thoroughly irrigated. The deep fascia was closed using a heavy Quill after infiltration of 30 cc of quarter percent Sensorcaine with epinephrine. 3-0 v-loc to close the skin with skin staples Xeroform and honeycomb dressing applied  PLAN OF CARE: Admit to inpatient

## 2016-10-22 NOTE — Evaluation (Signed)
Physical Therapy Evaluation Patient Details Name: Jeffrey Grant MRN: [id number] DOB: September 09, 1980 Today's Date: 10/22/2016   History of Present Illness  Pt admitted for R ant. THR.   Clinical Impression  Pt is a pleasant 36 year old male who was admitted for R THR anterior approach. Pt performs bed mobility with min assist, transfers with cga, and ambulation with cga and RW. Pt demonstrates deficits with strength/pain/mobility. Pt appears motivated to perform therapy. Would benefit from skilled PT to address above deficits and promote optimal return to PLOF. Recommend transition to HHPT upon discharge from acute hospitalization.       Follow Up Recommendations Home health PT    Equipment Recommendations  Rolling walker with 5" wheels    Recommendations for Other Services OT consult     Precautions / Restrictions Precautions Precautions: Anterior Hip;Fall Precaution Booklet Issued: No Restrictions Weight Bearing Restrictions: Yes RLE Weight Bearing: Weight bearing as tolerated      Mobility  Bed Mobility Overal bed mobility: Needs Assistance Bed Mobility: Supine to Sit     Supine to sit: Min assist     General bed mobility comments: assist for sliding B LEs off bed and then lowering R LE to ground. Pt follows directions well for trunk mobility.  Transfers Overall transfer level: Needs assistance Equipment used: Rolling walker (2 wheeled) Transfers: Sit to/from Stand Sit to Stand: Min guard         General transfer comment: able to push from seated surface and stand with upright posture. Heavy reliance on B UE for WBing secondary to pain.  Ambulation/Gait Ambulation/Gait assistance: Min guard Ambulation Distance (Feet): 5 Feet Assistive device: Rolling walker (2 wheeled) Gait Pattern/deviations: Step-to pattern     General Gait Details: ambulated with slow technique to recliner. Needs heavy cues for sequencing of RW  Stairs            Wheelchair  Mobility    Modified Rankin (Stroke Patients Only)       Balance Overall balance assessment: Needs assistance Sitting-balance support: Feet supported Sitting balance-Leahy Scale: Good     Standing balance support: Bilateral upper extremity supported Standing balance-Leahy Scale: Good                               Pertinent Vitals/Pain Pain Assessment: 0-10 Pain Score: 10-Worst pain ever Pain Descriptors / Indicators: Discomfort;Operative site guarding Pain Intervention(s): Limited activity within patient's tolerance;Ice applied    Home Living Family/patient expects to be discharged to:: Private residence Living Arrangements: Spouse/significant other;Children Available Help at Discharge: Family Type of Home: House Home Access: Stairs to enter Entrance Stairs-Rails: Right Entrance Stairs-Number of Steps: 8 Home Layout: One level Home Equipment: None      Prior Function Level of Independence: Independent         Comments: reports he was not working prior to admission     Hand Dominance        Extremity/Trunk Assessment   Upper Extremity Assessment Upper Extremity Assessment: Overall WFL for tasks assessed    Lower Extremity Assessment Lower Extremity Assessment: Generalized weakness (R LE grossly 3/5; not formally tested; L LE grossly 5/5)       Communication   Communication: No difficulties  Cognition Arousal/Alertness: Awake/alert Behavior During Therapy: WFL for tasks assessed/performed Overall Cognitive Status: Within Functional Limits for tasks assessed  General Comments      Exercises Other Exercises Other Exercises: supine ther-ex performed on R LE including ankle pumps, quad sets, glut sets, and hip abd/add. All ther-ex performed x 10 reps with cga and safe technique.   Assessment/Plan    PT Assessment Patient needs continued PT services  PT Problem List Decreased  strength;Decreased activity tolerance;Decreased mobility;Pain;Decreased knowledge of use of DME       PT Treatment Interventions DME instruction;Gait training;Stair training;Therapeutic exercise    PT Goals (Current goals can be found in the Care Plan section)  Acute Rehab PT Goals Patient Stated Goal: to get stronger and go home PT Goal Formulation: With patient Time For Goal Achievement: 11/05/16 Potential to Achieve Goals: Good    Frequency BID   Barriers to discharge        Co-evaluation               AM-PAC PT "6 Clicks" Daily Activity  Outcome Measure Difficulty turning over in bed (including adjusting bedclothes, sheets and blankets)?: Total Difficulty moving from lying on back to sitting on the side of the bed? : Total Difficulty sitting down on and standing up from a chair with arms (e.g., wheelchair, bedside commode, etc,.)?: Total Help needed moving to and from a bed to chair (including a wheelchair)?: A Little Help needed walking in hospital room?: A Little Help needed climbing 3-5 steps with a railing? : A Little 6 Click Score: 12    End of Session Equipment Utilized During Treatment: Gait belt Activity Tolerance: Patient tolerated treatment well Patient left: in chair;with chair alarm set Nurse Communication: Mobility status PT Visit Diagnosis: Unsteadiness on feet (R26.81);Muscle weakness (generalized) (M62.81);Pain Pain - Right/Left: Right Pain - part of body: Hip    Time: 7948-0165 PT Time Calculation (min) (ACUTE ONLY): 29 min   Charges:   PT Evaluation $PT Eval Moderate Complexity: 1 Procedure PT Treatments $Therapeutic Exercise: 8-22 mins   PT G Codes:        Elizabeth Palau, PT, DPT [phone]   Dominick Zertuche 10/22/2016, 5:05 PM

## 2016-10-22 NOTE — NC FL2 (Signed)
[place] MEDICAID FL2 LEVEL OF CARE SCREENING TOOL     IDENTIFICATION  Patient Name: Jeffrey Grant Birthdate: 1981-02-22 Sex: male Admission Date (Current Location): 10/22/2016  [facility] and [place] Number:  [address]  ([id number]) Facility and Address:  [facility], [address] [place], [place] [address]      Provider Number: [id number]  Attending Physician Name and Address:  Kennedy Bucker, MD  Relative Name and Phone Number:       Current Level of Care: Hospital Recommended Level of Care: Skilled Nursing Facility Prior Approval Number:    Date Approved/Denied:   PASRR Number:  [id number])  Discharge Plan: SNF    Current Diagnoses: Patient Active Problem List   Diagnosis Date Noted  . 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    Orientation RESPIRATION BLADDER Height & Weight     Self, Time, Situation, Place  Normal Continent Weight:   Height:     BEHAVIORAL SYMPTOMS/MOOD NEUROLOGICAL BOWEL NUTRITION STATUS   (none)  (none) Continent Diet (Diet: Clear Liquid to be advanced )  AMBULATORY STATUS COMMUNICATION OF NEEDS Skin   Extensive Assist Verbally Surgical wounds (Incision: Right Hip. )                       Personal Care Assistance Level of Assistance  Bathing, Feeding, Dressing Bathing Assistance: Limited assistance Feeding assistance: Independent Dressing Assistance: Limited assistance     Functional Limitations Info  Sight, Hearing, Speech Sight Info: Adequate Hearing Info: Adequate Speech Info: Adequate    SPECIAL CARE FACTORS FREQUENCY  PT (By licensed PT), OT (By licensed OT)     PT Frequency:  (5) OT Frequency:  (5)            Contractures      Additional Factors Info  Code Status, Allergies Code Status Info:  (Full Code. ) Allergies Info:  (No Known Allergies. )           Current Medications (10/22/2016):  This is the current hospital active medication list Current Facility-Administered  Medications  Medication Dose Route Frequency Provider Last Rate Last Dose  . 0.9 %  sodium chloride infusion   Intravenous Continuous Kennedy Bucker, MD 100 mL/hr at 10/22/16 1357    . acetaminophen (TYLENOL) tablet 650 mg  650 mg Oral Q6H PRN Kennedy Bucker, MD       Or  . acetaminophen (TYLENOL) suppository 650 mg  650 mg Rectal Q6H PRN Kennedy Bucker, MD      . amLODipine (NORVASC) tablet 10 mg  10 mg Oral QPM Kennedy Bucker, MD      . atorvastatin (LIPITOR) tablet 40 mg  40 mg Oral QPM Kennedy Bucker, MD      . bisacodyl (DULCOLAX) suppository 10 mg  10 mg Rectal Daily PRN Kennedy Bucker, MD      . ceFAZolin (ANCEF) 2 g in dextrose 5 % 100 mL IVPB  2 g Intravenous Q6H Kennedy Bucker, MD      . diphenhydrAMINE (BENADRYL) 12.5 MG/5ML elixir 12.5-25 mg  12.5-25 mg Oral Q4H PRN Kennedy Bucker, MD      . docusate sodium (COLACE) capsule 100 mg  100 mg Oral BID Kennedy Bucker, MD      . Melene Muller ON 10/23/2016] enoxaparin (LOVENOX) injection 40 mg  40 mg Subcutaneous Q24H Kennedy Bucker, MD      . folic acid (FOLVITE) tablet 1 mg  1 mg Oral QPM Kennedy Bucker, MD      . hydrochlorothiazide (HYDRODIURIL)  tablet 25 mg  25 mg Oral Daily Kennedy Bucker, MD      . Melene Muller ON 10/23/2016] lisinopril (PRINIVIL,ZESTRIL) tablet 40 mg  40 mg Oral Daily Kennedy Bucker, MD      . magnesium citrate solution 1 Bottle  1 Bottle Oral Once PRN Kennedy Bucker, MD      . magnesium hydroxide (MILK OF MAGNESIA) suspension 30 mL  30 mL Oral Daily PRN Kennedy Bucker, MD      . meclizine (ANTIVERT) tablet 25 mg  25 mg Oral TID PRN Kennedy Bucker, MD      . menthol-cetylpyridinium (CEPACOL) lozenge 3 mg  1 lozenge Oral PRN Kennedy Bucker, MD       Or  . phenol (CHLORASEPTIC) mouth spray 1 spray  1 spray Mouth/Throat PRN Kennedy Bucker, MD      . methocarbamol (ROBAXIN) tablet 500 mg  500 mg Oral Q6H PRN Kennedy Bucker, MD       Or  . methocarbamol (ROBAXIN) 500 mg in dextrose 5 % 50 mL IVPB  500 mg Intravenous Q6H PRN Kennedy Bucker, MD      . metoCLOPramide  (REGLAN) tablet 5-10 mg  5-10 mg Oral Q8H PRN Kennedy Bucker, MD       Or  . metoCLOPramide (REGLAN) injection 5-10 mg  5-10 mg Intravenous Q8H PRN Kennedy Bucker, MD      . Melene Muller ON 10/23/2016] metoprolol succinate (TOPROL-XL) 24 hr tablet 100 mg  100 mg Oral Daily Kennedy Bucker, MD      . morphine 2 MG/ML injection 2 mg  2 mg Intravenous Q1H PRN Kennedy Bucker, MD      . ondansetron [facility]) tablet 4 mg  4 mg Oral Q6H PRN Kennedy Bucker, MD       Or  . ondansetron [facility]) injection 4 mg  4 mg Intravenous Q6H PRN Kennedy Bucker, MD      . oxyCODONE (Oxy IR/ROXICODONE) immediate release tablet 5-10 mg  5-10 mg Oral Q3H PRN Kennedy Bucker, MD   10 mg at 10/22/16 1403  . tranexamic acid (CYKLOKAPRON) 1,000 mg in sodium chloride 0.9 % 100 mL IVPB  1,000 mg Intravenous Once Kennedy Bucker, MD      . traZODone (DESYREL) tablet 100 mg  100 mg Oral QHS PRN Kennedy Bucker, MD      . Melene Muller ON 10/23/2016] Vitamin D (Ergocalciferol) (DRISDOL) capsule 50,000 Units  50,000 Units Oral Q Wed Kennedy Bucker, MD      . zolpidem [facility]) tablet 5 mg  5 mg Oral QHS PRN,MR X 1 Kennedy Bucker, MD         Discharge Medications: Please see discharge summary for a list of discharge medications.  Relevant Imaging Results:  Relevant Lab Results:   Additional Information  (SSN: [id number])  Helton Oleson, Darleen Crocker, LCSW

## 2016-10-22 NOTE — Anesthesia Procedure Notes (Signed)
Date/Time: 10/22/2016 10:46 AM Performed by: Junious Silk Pre-anesthesia Checklist: Patient identified, Emergency Drugs available, Suction available, Patient being monitored and Timeout performed Oxygen Delivery Method: Simple face mask

## 2016-10-22 NOTE — H&P (Signed)
Reviewed paper H+P, will be scanned into chart. Patient examined No changes noted.  

## 2016-10-22 NOTE — Anesthesia Post-op Follow-up Note (Cosign Needed)
Anesthesia QCDR form completed.        

## 2016-10-23 LAB — BASIC METABOLIC PANEL
ANION GAP: 7 (ref 5–15)
BUN: 9 mg/dL (ref 6–20)
CALCIUM: 8.8 mg/dL — AB (ref 8.9–10.3)
CHLORIDE: 100 mmol/L — AB (ref 101–111)
CO2: 31 mmol/L (ref 22–32)
Creatinine, Ser: 0.83 mg/dL (ref 0.61–1.24)
GFR calc non Af Amer: >60 mL/min (ref >60)
Glucose, Bld: 171 mg/dL — ABNORMAL HIGH (ref 65–99)
POTASSIUM: 4.1 mmol/L (ref 3.5–5.1)
Sodium: 138 mmol/L (ref 135–145)

## 2016-10-23 LAB — CBC
HEMATOCRIT: 36 % — AB (ref 40.0–52.0)
HEMOGLOBIN: 11.8 g/dL — AB (ref 13.0–18.0)
MCH: 28.8 pg (ref 26.0–34.0)
MCHC: 32.8 g/dL (ref 32.0–36.0)
MCV: 88 fL (ref 80.0–100.0)
Platelets: 304 10*3/uL (ref 150–440)
RBC: 4.08 MIL/uL — ABNORMAL LOW (ref 4.40–5.90)
RDW: 15.5 % — ABNORMAL HIGH (ref 11.5–14.5)
WBC: 14.1 10*3/uL — AB (ref 3.8–10.6)

## 2016-10-23 MED ORDER — KETOROLAC TROMETHAMINE 15 MG/ML IJ SOLN
15.0000 mg | Freq: Four times a day (QID) | INTRAMUSCULAR | Status: DC
  Administered 2016-10-23 – 2016-10-24 (×6): 15 mg via INTRAVENOUS
  Filled 2016-10-23 (×6): qty 1

## 2016-10-23 MED ORDER — MORPHINE SULFATE (PF) 2 MG/ML IV SOLN
2.0000 mg | INTRAVENOUS | Status: DC | PRN
  Administered 2016-10-23: 4 mg via INTRAVENOUS
  Administered 2016-10-23: 2 mg via INTRAVENOUS
  Administered 2016-10-23: 4 mg via INTRAVENOUS
  Filled 2016-10-23 (×2): qty 2
  Filled 2016-10-23: qty 1

## 2016-10-23 MED ORDER — KETOROLAC TROMETHAMINE 30 MG/ML IJ SOLN
30.0000 mg | Freq: Once | INTRAMUSCULAR | Status: AC
  Administered 2016-10-23: 30 mg via INTRAVENOUS
  Filled 2016-10-23 (×2): qty 1

## 2016-10-23 NOTE — Care Management Note (Signed)
Case Management Note  Patient Details  Name: Jeffrey Grant MRN: [id number] Date of Birth: [birth year]  Subjective/Objective:    Met with patient at bedside. He does not meet criteria for home health PT because he does not have a qualifying diagnosis for medicaid guidelines. He does not qualify for charity because he has medicaid. Ordered patient a walker. PAtient verbalized undertanding. Case Closed.                Action/Plan:   Expected Discharge Date:  10/24/16               Expected Discharge Plan:  Home/Self Care  In-House Referral:     Discharge planning Services  CM Consult  Post Acute Care Choice:    Choice offered to:     DME Arranged:    DME Agency:     HH Arranged:    HH Agency:     Status of Service:  Completed, signed off  If discussed at [employer] of Stay Meetings, dates discussed:    Additional Comments:  Jolly Mango, RN 10/23/2016, 2:45 PM

## 2016-10-23 NOTE — Progress Notes (Signed)
Physical Therapy Treatment Patient Details Name: Jeffrey Grant MRN: [id number] DOB: 04-02-1981 Today's Date: 10/23/2016    History of Present Illness Pt admitted for R ant. THR.     PT Comments    Pt is making good progress towards goals with improved ease of mobility this date. Pt reports pain is better controlled at this time. Pt responds to cues well. Good technique performed with HEP and written there-ex reviewed. Pt appears motivated to perform therapy. Will continue to progress.   Follow Up Recommendations  Home health PT     Equipment Recommendations  Rolling walker with 5" wheels    Recommendations for Other Services OT consult     Precautions / Restrictions Precautions Precautions: Anterior Hip;Fall Precaution Booklet Issued: Yes (comment) Restrictions Weight Bearing Restrictions: Yes RLE Weight Bearing: Weight bearing as tolerated    Mobility  Bed Mobility Overal bed mobility: Needs Assistance Bed Mobility: Supine to Sit     Supine to sit: Min guard     General bed mobility comments: safe technique for sliding B LEs off bed and guiding R LE to ground. Improved ease of transfer noted this date  Transfers Overall transfer level: Needs assistance Equipment used: Rolling walker (2 wheeled) Transfers: Sit to/from Stand Sit to Stand: Min guard         General transfer comment: able to push from seated surface with cues for sequencing. Safe technique performed  Ambulation/Gait Ambulation/Gait assistance: Min guard Ambulation Distance (Feet): 100 Feet Assistive device: Rolling walker (2 wheeled) Gait Pattern/deviations: Step-to pattern     General Gait Details: ambulated with RW and slow step to gait pattern. Pt cued for looking forward instead of at ground. Small step length noted. Pt able to progress to inconsistent reciprocal gait with improved step length.   Stairs            Wheelchair Mobility    Modified Rankin (Stroke Patients Only)        Balance                                            Cognition Arousal/Alertness: Awake/alert Behavior During Therapy: WFL for tasks assessed/performed Overall Cognitive Status: Within Functional Limits for tasks assessed                                        Exercises Other Exercises Other Exercises: supine ther-ex performed on R LE including ankle pumps, quad sets, glut sets, SAQ, SLRs, and hip abd/add. All ther-ex performed x 12 reps with cga and safe technique.    General Comments        Pertinent Vitals/Pain Pain Assessment: 0-10 Pain Score: 6  Pain Location: R hip Pain Descriptors / Indicators: Discomfort;Operative site guarding Pain Intervention(s): Limited activity within patient's tolerance;Ice applied    Home Living                      Prior Function            PT Goals (current goals can now be found in the care plan section) Acute Rehab PT Goals Patient Stated Goal: to get stronger and go home PT Goal Formulation: With patient Time For Goal Achievement: 11/05/16 Potential to Achieve Goals: Good Progress towards PT goals: Progressing toward goals  Frequency    BID      PT Plan Current plan remains appropriate    Co-evaluation              AM-PAC PT "6 Clicks" Daily Activity  Outcome Measure  Difficulty turning over in bed (including adjusting bedclothes, sheets and blankets)?: Total Difficulty moving from lying on back to sitting on the side of the bed? : Total Difficulty sitting down on and standing up from a chair with arms (e.g., wheelchair, bedside commode, etc,.)?: Total Help needed moving to and from a bed to chair (including a wheelchair)?: A Little Help needed walking in hospital room?: A Little Help needed climbing 3-5 steps with a railing? : A Little 6 Click Score: 12    End of Session Equipment Utilized During Treatment: Gait belt Activity Tolerance: Patient tolerated  treatment well Patient left: in chair;with chair alarm set Nurse Communication: Mobility status PT Visit Diagnosis: Unsteadiness on feet (R26.81);Muscle weakness (generalized) (M62.81);Pain Pain - Right/Left: Right Pain - part of body: Hip     Time: 0927-0955 PT Time Calculation (min) (ACUTE ONLY): 28 min  Charges:  $Gait Training: 8-22 mins $Therapeutic Exercise: 8-22 mins                    G Codes:       Elizabeth Palau, PT, DPT [phone]    Ed Rayson 10/23/2016, 10:33 AM

## 2016-10-23 NOTE — Progress Notes (Signed)
Physical Therapy Treatment Patient Details Name: Jeffrey Grant MRN: [id number] DOB: December 21, 1980 Today's Date: 10/23/2016    History of Present Illness Pt admitted for R ant. THR.     PT Comments    Pt is making good progress towards goals and appears motivated to perform therapy. Improved technique performed with reciprocal gait pattern and symmetrical step length. Good endurance with HEP. Will need to perform stair training prior to dc home. Will continue to progress.   Follow Up Recommendations  Home health PT     Equipment Recommendations  Rolling walker with 5" wheels    Recommendations for Other Services OT consult     Precautions / Restrictions Precautions Precautions: Anterior Hip;Fall Precaution Booklet Issued: Yes (comment) Restrictions Weight Bearing Restrictions: Yes RLE Weight Bearing: Weight bearing as tolerated    Mobility  Bed Mobility Overal bed mobility: Needs Assistance Bed Mobility: Sit to Supine     Supine to sit: Min guard Sit to supine: Min guard   General bed mobility comments: safe technique performed with slight assist for R LE. Pt able to scoot up towards [facility] with supervision  Transfers Overall transfer level: Needs assistance Equipment used: Rolling walker (2 wheeled) Transfers: Sit to/from Stand Sit to Stand: Min guard         General transfer comment: able to push from seated surface with cues for sequencing. Safe technique performed  Ambulation/Gait Ambulation/Gait assistance: Min guard Ambulation Distance (Feet): 200 Feet Assistive device: Rolling walker (2 wheeled) Gait Pattern/deviations: Step-through pattern     General Gait Details: ambulated using RW and slow reciprocal gait pattern. Pt able to consistently perform symmetrical step length and maintain upright posture. RW adjusted to height. Decreased pain with mobility, however complaints of stiffness. Safe technique performed.   Stairs            Wheelchair  Mobility    Modified Rankin (Stroke Patients Only)       Balance Overall balance assessment: Needs assistance Sitting-balance support: Feet supported Sitting balance-Leahy Scale: Good     Standing balance support: Bilateral upper extremity supported Standing balance-Leahy Scale: Good                              Cognition Arousal/Alertness: Awake/alert Behavior During Therapy: WFL for tasks assessed/performed Overall Cognitive Status: Within Functional Limits for tasks assessed                                        Exercises Other Exercises Other Exercises: Seated ther-ex performed on R LE including ankle pumps, quad sets, glut sets, LAQ, hip abd/add, and alt. seated marching. All ther-ex performed x 12 reps with cues and cga.    General Comments        Pertinent Vitals/Pain Pain Assessment: Faces Pain Score: 6  Faces Pain Scale: Hurts little more Pain Location: R hip Pain Descriptors / Indicators: Discomfort;Operative site guarding Pain Intervention(s): Limited activity within patient's tolerance;Ice applied    Home Living Family/patient expects to be discharged to:: Private residence Living Arrangements: Spouse/significant other;Children Available Help at Discharge: Family Type of Home: House Home Access: Stairs to enter Entrance Stairs-Rails: Right Home Layout: One level Home Equipment: None      Prior Function Level of Independence: Independent      Comments: reports he was not working prior to admission, on disability   PT  Goals (current goals can now be found in the care plan section) Acute Rehab PT Goals Patient Stated Goal: Patient would like to go home and be able to do his normal routine PT Goal Formulation: With patient Time For Goal Achievement: 11/05/16 Potential to Achieve Goals: Good Progress towards PT goals: Progressing toward goals    Frequency    BID      PT Plan Current plan remains appropriate     Co-evaluation              AM-PAC PT "6 Clicks" Daily Activity  Outcome Measure  Difficulty turning over in bed (including adjusting bedclothes, sheets and blankets)?: None Difficulty moving from lying on back to sitting on the side of the bed? : A Little Difficulty sitting down on and standing up from a chair with arms (e.g., wheelchair, bedside commode, etc,.)?: A Little Help needed moving to and from a bed to chair (including a wheelchair)?: None Help needed walking in hospital room?: A Little Help needed climbing 3-5 steps with a railing? : A Little 6 Click Score: 20    End of Session Equipment Utilized During Treatment: Gait belt Activity Tolerance: Patient tolerated treatment well Patient left: in bed;with bed alarm set;with SCD's reapplied Nurse Communication: Mobility status PT Visit Diagnosis: Unsteadiness on feet (R26.81);Muscle weakness (generalized) (M62.81);Pain Pain - Right/Left: Right Pain - part of body: Hip     Time: 2202-5427 PT Time Calculation (min) (ACUTE ONLY): 23 min  Charges:  $Gait Training: 8-22 mins $Therapeutic Exercise: 8-22 mins                    G Codes:       Elizabeth Palau, PT, DPT [phone]    Eran Mistry 10/23/2016, 3:09 PM

## 2016-10-23 NOTE — Evaluation (Signed)
Occupational Therapy Evaluation Patient Details Name: Jeffrey Grant MRN: [id number] DOB: 1980-07-12 Today's Date: 10/23/2016    History of Present Illness Pt admitted for R ant. THR.    Clinical Impression   Patient seen for OT evaluation this date.  Patient lives with wife and children in a one story home with 8 steps to enter.  He was previously independent with all self care tasks, light homemaking skills and light meal preparation.  Patient now demonstrates muscle weakness in LE, decreased ability to perform transfers and functional mobility skills, and decreased ability to perform self care tasks.  Patient would benefit from skilled OT to maximize safety and independence in daily tasks.       Follow Up Recommendations       Equipment Recommendations       Recommendations for Other Services       Precautions / Restrictions Precautions Precautions: Anterior Hip;Fall Restrictions Weight Bearing Restrictions: Yes RLE Weight Bearing: Weight bearing as tolerated      Mobility Bed Mobility Overal bed mobility: Needs Assistance Bed Mobility: Supine to Sit     Supine to sit: Min guard        Transfers Overall transfer level: Needs assistance Equipment used: Rolling walker (2 wheeled) Transfers: Sit to/from Stand Sit to Stand: Min guard              Balance Overall balance assessment: Needs assistance Sitting-balance support: Feet supported Sitting balance-Leahy Scale: Good     Standing balance support: Bilateral upper extremity supported Standing balance-Leahy Scale: Good                             ADL either performed or assessed with clinical judgement   ADL Overall ADL's : Needs assistance/impaired Eating/Feeding: Independent   Grooming: Independent   Upper Body Bathing: Independent   Lower Body Bathing: Minimal assistance   Upper Body Dressing : Independent   Lower Body Dressing: Minimal assistance   Toilet Transfer: Min  guard;Comfort height toilet Toilet Transfer Details (indicate cue type and reason): utilized 3 in one over commode Toileting- Architect and Hygiene: Min guard       Functional mobility during ADLs: Min guard General ADL Comments: Patient lives with his wife and kids who are able to help him at home.  He previously was independent with all self care tasks, light meal prep and light homemaking tasks.  He did not drive.      Vision         Perception     Praxis      Pertinent Vitals/Pain Pain Assessment: 0-10 Pain Score: 6  Pain Location: R hip Pain Descriptors / Indicators: Discomfort;Operative site guarding Pain Intervention(s): Limited activity within patient's tolerance;Repositioned;Monitored during session     Hand Dominance Left   Extremity/Trunk Assessment Upper Extremity Assessment Upper Extremity Assessment: Overall WFL for tasks assessed   Lower Extremity Assessment Lower Extremity Assessment: Generalized weakness   Cervical / Trunk Assessment Cervical / Trunk Assessment: Normal   Communication Communication Communication: No difficulties   Cognition Arousal/Alertness: Awake/alert Behavior During Therapy: WFL for tasks assessed/performed Overall Cognitive Status: Within Functional Limits for tasks assessed                                     General Comments       Exercises     Shoulder Instructions  Home Living Family/patient expects to be discharged to:: Private residence Living Arrangements: Spouse/significant other;Children Available Help at Discharge: Family Type of Home: House Home Access: Stairs to enter Secretary/administrator of Steps: 8 Entrance Stairs-Rails: Right Home Layout: One level               Home Equipment: None          Prior Functioning/Environment Level of Independence: Independent        Comments: reports he was not working prior to admission, on disability        OT  Problem List: Decreased strength;Impaired balance (sitting and/or standing);Decreased knowledge of precautions;Pain;Decreased range of motion;Decreased knowledge of use of DME or AE      OT Treatment/Interventions: Self-care/ADL training;DME and/or AE instruction;Therapeutic activities;Therapeutic exercise;Patient/family education;Balance training    OT Goals(Current goals can be found in the care plan section) Acute Rehab OT Goals Patient Stated Goal: Patient would like to go home and be able to do his normal routine OT Goal Formulation: With patient Time For Goal Achievement: 11/02/16 Potential to Achieve Goals: Good  OT Frequency: Min 1X/week   Barriers to D/C:            Co-evaluation              AM-PAC PT "6 Clicks" Daily Activity     Outcome Measure Help from another person eating meals?: None Help from another person taking care of personal grooming?: None Help from another person toileting, which includes using toliet, bedpan, or urinal?: A Little Help from another person bathing (including washing, rinsing, drying)?: A Little Help from another person to put on and taking off regular upper body clothing?: None Help from another person to put on and taking off regular lower body clothing?: A Little 6 Click Score: 21   End of Session Equipment Utilized During Treatment: Gait belt;Rolling walker  Activity Tolerance: Patient tolerated treatment well;Patient limited by pain Patient left: in bed;with call bell/phone within reach;with bed alarm set  OT Visit Diagnosis: Muscle weakness (generalized) (M62.81);Pain;Unsteadiness on feet (R26.81) Pain - Right/Left: Right Pain - part of body: Hip                Time: 0205-0235 OT Time Calculation (min): 30 min Charges:  OT General Charges $OT Visit: 1 Procedure OT Evaluation $OT Eval Low Complexity: 1 Procedure OT Treatments $Self Care/Home Management : 8-22 mins G-Codes:     Eyvonne Burchfield T Sitlaly Gudiel, OTR/L,  CLT   Torren Maffeo 10/23/2016, 2:45 PM

## 2016-10-23 NOTE — Progress Notes (Signed)
Clinical Social Worker (CSW) received SNF consult. PT is recommending home health. RN case manager aware of above. Please reconsult if future social work needs arise. CSW signing off.   Holbert Caples, LCSW [phone] 

## 2016-10-23 NOTE — Progress Notes (Signed)
   Subjective: 1 Day Post-Op Procedure(s) (LRB): TOTAL HIP ARTHROPLASTY ANTERIOR APPROACH (Right) Patient reports pain as 3 on 0-10 scale.   Patient is well, and has had no acute complaints or problems Denies any CP, SOB, ABD pain. We will continue therapy today.  Plan is to go Home after hospital stay.  Objective: Vital signs in last 24 hours: Temp:  [95.6 F (35.3 C)-98.4 F (36.9 C)] 98.2 F (36.8 C) (05/02 0423) Pulse Rate:  [67-91] 89 (05/02 0423) Resp:  [16-25] 16 (05/02 0423) BP: (108-187)/(57-109) 124/59 (05/02 0423) SpO2:  [92 %-100 %] 97 % (05/02 0423) Weight:  [88.5 kg (195 lb)] 88.5 kg (195 lb) (05/01 1345)  Intake/Output from previous day: 05/01 0701 - 05/02 0700 In: 1360 [I.V.:1130; IV Piggyback:230] Out: 4100 [Urine:3800; Blood:300] Intake/Output this shift: No intake/output data recorded.   Recent Labs  10/22/16 1407 10/23/16 0525  HGB 11.9* 11.8*    Recent Labs  10/22/16 1407 10/23/16 0525  WBC 8.9 14.1*  RBC 4.02* 4.08*  HCT 35.4* 36.0*  PLT 311 304    Recent Labs  10/22/16 1407 10/23/16 0525  NA  --  138  K  --  4.1  CL  --  100*  CO2  --  31  BUN  --  9  CREATININE 1.07 0.83  GLUCOSE  --  171*  CALCIUM  --  8.8*   No results for input(s): LABPT, INR in the last 72 hours.  EXAM General - Patient is Alert, Appropriate and Oriented Extremity - Neurovascular intact Sensation intact distally Intact pulses distally Dorsiflexion/Plantar flexion intact No cellulitis present Compartment soft Dressing - dressing C/D/I and no drainage Motor Function - intact, moving foot and toes well on exam.   Past Medical History:  Diagnosis Date  . Anxiety   . Atherosclerotic PVD with intermittent claudication (HCC)   . Back pain   . Dyspnea   . Dysrhythmia   . Elevated lipids   . Hypertension   . Nausea   . Seropositive rheumatoid arthritis (HCC)   . Sjogren's syndrome (HCC)     Assessment/Plan:   1 Day Post-Op Procedure(s)  (LRB): TOTAL HIP ARTHROPLASTY ANTERIOR APPROACH (Right) Active Problems:   Secondary osteoarthritis of hip  Estimated body mass index is 29.65 kg/m as calculated from the following:   Height as of this encounter: 5\' 8"  (1.727 m).   Weight as of this encounter: 88.5 kg (195 lb). Advance diet Up with therapy  Needs BM Care management to assist with discharge Recheck labs in the morning   DVT Prophylaxis - Lovenox, Foot Pumps and TED hose Weight-Bearing as tolerated to right leg   T. , PA-C [facility] 10/23/2016, 7:40 AM

## 2016-10-23 NOTE — Progress Notes (Signed)
Pain uncontrolled with current regime. MD notified. New orders received.

## 2016-10-23 NOTE — Anesthesia Postprocedure Evaluation (Signed)
Anesthesia Post Note  Patient: Darlys Gales  Procedure(s) Performed: Procedure(s) (LRB): TOTAL HIP ARTHROPLASTY ANTERIOR APPROACH (Right)  Patient location during evaluation: Nursing Unit Anesthesia Type: Spinal Level of consciousness: awake, awake and alert and oriented Pain management: pain level controlled Vital Signs Assessment: post-procedure vital signs reviewed and stable Respiratory status: spontaneous breathing, nonlabored ventilation and respiratory function stable Cardiovascular status: stable Postop Assessment: no headache and no backache Anesthetic complications: no     Last Vitals:  Vitals:   10/23/16 0423 10/23/16 0743  BP: (!) 124/59 127/72  Pulse: 89 79  Resp: 16 18  Temp: 36.8 C 36.8 C    Last Pain:  Vitals:   10/23/16 0743  TempSrc: Oral  PainSc:                  Lyn Records

## 2016-10-24 ENCOUNTER — Encounter: Payer: Self-pay | Admitting: Orthopedic Surgery

## 2016-10-24 LAB — BASIC METABOLIC PANEL
Anion gap: 7 (ref 5–15)
BUN: 14 mg/dL (ref 6–20)
CHLORIDE: 99 mmol/L — AB (ref 101–111)
CO2: 31 mmol/L (ref 22–32)
CREATININE: 1.01 mg/dL (ref 0.61–1.24)
Calcium: 8.9 mg/dL (ref 8.9–10.3)
Glucose, Bld: 121 mg/dL — ABNORMAL HIGH (ref 65–99)
POTASSIUM: 3.9 mmol/L (ref 3.5–5.1)
SODIUM: 137 mmol/L (ref 135–145)

## 2016-10-24 LAB — CBC
HCT: 34.8 % — ABNORMAL LOW (ref 40.0–52.0)
HEMOGLOBIN: 11.7 g/dL — AB (ref 13.0–18.0)
MCH: 29.6 pg (ref 26.0–34.0)
MCHC: 33.8 g/dL (ref 32.0–36.0)
MCV: 87.7 fL (ref 80.0–100.0)
Platelets: 272 10*3/uL (ref 150–440)
RBC: 3.97 MIL/uL — AB (ref 4.40–5.90)
RDW: 15.1 % — ABNORMAL HIGH (ref 11.5–14.5)
WBC: 12.4 10*3/uL — ABNORMAL HIGH (ref 3.8–10.6)

## 2016-10-24 MED ORDER — METHOCARBAMOL 500 MG PO TABS: 500.0000 mg | ORAL_TABLET | Freq: Four times a day (QID) | ORAL | 0 refills | Status: DC | PRN

## 2016-10-24 MED ORDER — OXYCODONE HCL 5 MG PO TABS: 5.0000 mg | ORAL_TABLET | ORAL | 0 refills | Status: DC | PRN

## 2016-10-24 MED ORDER — ENOXAPARIN SODIUM 40 MG/0.4ML ~~LOC~~ SOLN: 40.0000 mg | SUBCUTANEOUS | 0 refills | Status: DC

## 2016-10-24 NOTE — Discharge Instructions (Signed)

## 2016-10-24 NOTE — Progress Notes (Signed)
Rested quietly with no complaints.pain controlled.

## 2016-10-24 NOTE — Discharge Summary (Signed)
Physician Discharge Summary  Patient ID: Jeffrey Grant MRN: [id number] DOB/AGE: [birth year] 37 y.o.  Admit date: 10/22/2016 Discharge date: 10/24/2016  Admission Diagnoses:  right hip pain   Discharge Diagnoses: Patient Active Problem List   Diagnosis Date Noted  . 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    Past Medical History:  Diagnosis Date  . Anxiety   . Atherosclerotic PVD with intermittent claudication (HCC)   . Back pain   . Dyspnea   . Dysrhythmia   . Elevated lipids   . Hypertension   . Nausea   . Seropositive rheumatoid arthritis (HCC)   . Sjogren's syndrome (HCC)      Transfusion: None   Consultants (if any):   Discharged Condition: Improved  Hospital Course: Jeffrey Grant is an 36 y.o. 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    Surgeries: Procedure(s): TOTAL HIP ARTHROPLASTY ANTERIOR APPROACH on 10/22/2016 Patient tolerated the surgery well. Taken to PACU where she was stabilized and then transferred to the orthopedic floor.  Started on Lovenox 40 q 24 hrs. Foot pumps applied bilaterally at 80 mm. Heels elevated on bed with rolled towels. No evidence of DVT. Negative Homan. Physical therapy started on day #1 for gait training and transfer. OT started day #1 for ADL and assisted devices.  Patient's foley was d/c on day #1. On post op day #2 patient was stable and ready for discharge to home with home health PT.  Implants: Medacta Amis for standard collared stem with S ceramic head 28 mm and liner for 52 mm Mpact DM cup as well as cup  He was given perioperative antibiotics:  Anti-infectives    Start     Dose/Rate Route Frequency Ordered Stop   10/22/16 1500  ceFAZolin (ANCEF) 2 g in dextrose 5 % 100 mL IVPB     2 g 240 mL/hr over 30 Minutes Intravenous Every 6 hours 10/22/16 1346 10/23/16  0312   10/22/16 0706  ceFAZolin (ANCEF) 2-4 GM/100ML-% IVPB    Comments:  Slemenda, Debbie: cabinet override      10/22/16 0706 10/22/16 1020   10/21/16 2330  ceFAZolin (ANCEF) IVPB 2g/100 mL premix     2 g 200 mL/hr over 30 Minutes Intravenous  Once 10/21/16 2325 10/22/16 1030    .  He was given sequential compression devices, early ambulation, and Lovenox for DVT prophylaxis.  He benefited maximally from the hospital stay and there were no complications.    Recent vital signs:  Vitals:   10/24/16 0014 10/24/16 0825  BP: 126/71 115/68  Pulse: 92 88  Resp: 18 18  Temp: 98.6 F (37 C) 97.9 F (36.6 C)    Recent laboratory studies:  Lab Results  Component Value Date   HGB 11.7 (L) 10/24/2016   HGB 11.8 (L) 10/23/2016   HGB 11.9 (L) 10/22/2016   Lab Results  Component Value Date   WBC 12.4 (H) 10/24/2016   PLT 272 10/24/2016   Lab Results  Component Value Date   INR 0.91 10/09/2016   Lab Results  Component Value Date   NA 137 10/24/2016   K 3.9 10/24/2016   CL 99 (L) 10/24/2016   CO2 31 10/24/2016   BUN 14 10/24/2016   CREATININE 1.01 10/24/2016   GLUCOSE 121 (H) 10/24/2016    Discharge Medications:   Allergies as of 10/24/2016   No  Known Allergies     Medication List    STOP taking these medications   HYDROcodone-acetaminophen 5-325 MG tablet Commonly known as:  NORCO/VICODIN     TAKE these medications   amLODipine 10 MG tablet Commonly known as:  NORVASC Take 10 mg by mouth every evening.   atorvastatin 40 MG tablet Commonly known as:  LIPITOR Take 40 mg by mouth every evening.   docusate sodium 100 MG capsule Commonly known as:  COLACE Take 1 tablet once or twice daily as needed for constipation while taking narcotic pain medicine   enoxaparin 40 MG/0.4ML injection Commonly known as:  LOVENOX Inject 0.4 mLs (40 mg total) into the skin daily. Start taking on:  10/25/2016   folic acid 1 MG tablet Commonly known as:  FOLVITE Take 1 mg by  mouth every evening.   hydrochlorothiazide 25 MG tablet Commonly known as:  HYDRODIURIL Take 25 mg by mouth daily.   lisinopril 40 MG tablet Commonly known as:  PRINIVIL,ZESTRIL Take 40 mg by mouth daily.   meclizine 25 MG tablet Commonly known as:  ANTIVERT Take 25 mg by mouth 3 (three) times daily as needed for nausea.   methocarbamol 500 MG tablet Commonly known as:  ROBAXIN Take 1 tablet (500 mg total) by mouth every 6 (six) hours as needed for muscle spasms.   METHOTREXATE SODIUM IJ Inject 25 mg into the skin every Wednesday.   metoprolol succinate 100 MG 24 hr tablet Commonly known as:  TOPROL-XL Take 100 mg by mouth every evening.   naproxen 500 MG tablet Commonly known as:  NAPROSYN Take 1 tablet (500 mg total) by mouth 2 (two) times daily with a meal.   oxyCODONE 5 MG immediate release tablet Commonly known as:  Oxy IR/ROXICODONE Take 1-2 tablets (5-10 mg total) by mouth every 4 (four) hours as needed for breakthrough pain.   traZODone 100 MG tablet Commonly known as:  DESYREL Take 100 mg by mouth at bedtime as needed for sleep.   Vitamin D (Ergocalciferol) 50000 units Caps capsule Commonly known as:  DRISDOL Take 50,000 Units by mouth every Wednesday.            Durable Medical Equipment        Start     Ordered   10/23/16 1443  For home use only DME Walker rolling  Once    Question:  Patient needs a walker to treat with the following condition  Answer:  Weakness   10/23/16 1442      Diagnostic Studies: Dg Hip Operative Unilat W Or W/o Pelvis Right  Result Date: 10/22/2016 CLINICAL DATA:  Intraoperative imaging for right total hip replacement. EXAM: OPERATIVE RIGHT HIP (WITH PELVIS IF PERFORMED) 2 VIEWS TECHNIQUE: Fluoroscopic spot image(s) were submitted for interpretation post-operatively. COMPARISON:  CT abdomen and pelvis 09/16/2016. FINDINGS: Two fluoroscopic intraoperative spot views of the right hip are provided. On the first image, a single  screw is in place across the femoral neck as seen on the comparison examination. On the second image, a screw has been removed and a right total hip arthroplasty has been placed. The device is located. No acute abnormality is seen. IMPRESSION: Right total hip replacement.  No acute abnormality. Electronically Signed   By: Drusilla Kanner M.D.   On: 10/22/2016 12:19   Dg Hip Unilat W Or W/o Pelvis 2-3 Views Right  Result Date: 10/22/2016 CLINICAL DATA:  Status post right hip replacement today. EXAM: DG HIP (WITH OR WITHOUT PELVIS) 2-3V RIGHT  COMPARISON:  CT abdomen and pelvis 09/16/2016 and intraoperative spot views of the right hip this same day. FINDINGS: Right hip arthroplasty is in place. The device is located. No fracture is identified. Surgical staples are noted. IMPRESSION: Status post right hip replacement.  No acute abnormality. Electronically Signed   By: Drusilla Kanner M.D.   On: 10/22/2016 13:19    Disposition: 01-Home or Self Care    Follow-up Information    MENZ,MICHAEL, MD Follow up in 2 week(s).   Specialty:  Orthopedic Surgery Contact information: [address] [place] [address] [phone]            Signed: Patience Musca 10/24/2016, 8:30 AM

## 2016-10-24 NOTE — Care Management Note (Signed)
Case Management Note  Patient Details  Name: Jeffrey Grant MRN: [id number] Date of Birth: 06-29-1980  Subjective/Objective:   Discharging to day to home.                  Action/Plan: walker delivered. No further needs identified. Case Closed   Expected Discharge Date:  10/24/16               Expected Discharge Plan:  Home/Self Care  In-House Referral:     Discharge planning Services  CM Consult  Post Acute Care Choice:    Choice offered to:  Patient  DME Arranged:  Dan Humphreys rolling DME Agency:  [facility]  HH Arranged:    [facility] Agency:     Status of Service:  Completed, signed off  If discussed at Long Length of Stay Meetings, dates discussed:    Additional Comments:  Marily Memos, RN 10/24/2016, 9:04 AM

## 2016-10-24 NOTE — Progress Notes (Signed)
   Subjective: 2 Days Post-Op Procedure(s) (LRB): TOTAL HIP ARTHROPLASTY ANTERIOR APPROACH (Right) Patient reports pain as mild.   Patient is well, and has had no acute complaints or problems Denies any CP, SOB, ABD pain. We will continue therapy today.  Plan is to go Home after hospital stay.  Objective: Vital signs in last 24 hours: Temp:  [98.6 F (37 C)] 98.6 F (37 C) (05/03 0014) Pulse Rate:  [85-92] 88 (05/03 0825) Resp:  [18] 18 (05/03 0825) BP: (111-126)/(56-71) 115/68 (05/03 0825) SpO2:  [98 %-100 %] 98 % (05/03 0825)  Intake/Output from previous day: 05/02 0701 - 05/03 0700 In: 480 [P.O.:480] Out: 250 [Urine:250] Intake/Output this shift: No intake/output data recorded.   Recent Labs  10/22/16 1407 10/23/16 0525 10/24/16 0524  HGB 11.9* 11.8* 11.7*    Recent Labs  10/23/16 0525 10/24/16 0524  WBC 14.1* 12.4*  RBC 4.08* 3.97*  HCT 36.0* 34.8*  PLT 304 272    Recent Labs  10/23/16 0525 10/24/16 0524  NA 138 137  K 4.1 3.9  CL 100* 99*  CO2 31 31  BUN 9 14  CREATININE 0.83 1.01  GLUCOSE 171* 121*  CALCIUM 8.8* 8.9   No results for input(s): LABPT, INR in the last 72 hours.  EXAM General - Patient is Alert, Appropriate and Oriented Extremity - Neurovascular intact Sensation intact distally Intact pulses distally Dorsiflexion/Plantar flexion intact No cellulitis present Compartment soft Dressing - dressing C/D/I and no drainage Motor Function - intact, moving foot and toes well on exam.   Past Medical History:  Diagnosis Date  . Anxiety   . Atherosclerotic PVD with intermittent claudication (HCC)   . Back pain   . Dyspnea   . Dysrhythmia   . Elevated lipids   . Hypertension   . Nausea   . Seropositive rheumatoid arthritis (HCC)   . Sjogren's syndrome (HCC)     Assessment/Plan:   2 Days Post-Op Procedure(s) (LRB): TOTAL HIP ARTHROPLASTY ANTERIOR APPROACH (Right) Active Problems:   Secondary osteoarthritis of  hip  Estimated body mass index is 29.65 kg/m as calculated from the following:   Height as of this encounter: 5\' 8"  (1.727 m).   Weight as of this encounter: 88.5 kg (195 lb). Advance diet Up with therapy  Discharge home with home health physical therapy. Follow-up with [facility] orthopedics in 2 weeks for staple removal, Steri-Strip application. Keep dressing clean and dry.  DVT Prophylaxis - Lovenox, Foot Pumps and TED hose Weight-Bearing as tolerated to right leg   T. [facility], PA-C [facility] 10/24/2016, 8:26 AM

## 2016-10-24 NOTE — Progress Notes (Signed)
Pt discharged to home via wheelchair without incident per MD order. D/C teachings done prior to d/c with pt and significant other. All questions answered. Both pt and significant other agree to comply to teachings. Pt d/c to home with RW, 3-in-1 BSC and prescriptions for oxycodone, Lovenox and Robaxin. Pt discharged to home with Lovenox kit. Pt able to return demonstrate proper Lovenox administration.

## 2016-10-24 NOTE — Progress Notes (Signed)
Physical Therapy Treatment Patient Details Name: Jeffrey Grant MRN: [id number] DOB: February 06, 1981 Today's Date: 10/24/2016    History of Present Illness Pt admitted for R ant. THR.     PT Comments    Pt is making good progress towards goals and is ready to discharge this date. Stair training completed with safe technique and ambulation performed around RN station. Reviewed HEP this date and answered all questions.    Follow Up Recommendations  Home health PT     Equipment Recommendations  Rolling walker with 5" wheels;3in1 (PT)    Recommendations for Other Services OT consult     Precautions / Restrictions Precautions Precautions: Anterior Hip;Fall Precaution Booklet Issued: Yes (comment) Restrictions Weight Bearing Restrictions: Yes RLE Weight Bearing: Weight bearing as tolerated    Mobility  Bed Mobility Overal bed mobility: Needs Assistance Bed Mobility: Supine to Sit     Supine to sit: Min guard     General bed mobility comments: safe technique performed with pt able to practice sliding off L side of bed as is in home environment  Transfers Overall transfer level: Needs assistance Equipment used: Rolling walker (2 wheeled) Transfers: Sit to/from Stand Sit to Stand: Min guard         General transfer comment: able to push from seated surface with cues for sequencing. Safe technique performed  Ambulation/Gait Ambulation/Gait assistance: Min guard Ambulation Distance (Feet): 250 Feet Assistive device: Rolling walker (2 wheeled) Gait Pattern/deviations: Step-through pattern     General Gait Details: ambulated using RW and improved reciprocal gait pattern with good speed. Symmetrical step length noted with pt able to keep head up and carry conversation during ambulation. Safe technique performed   Stairs Stairs: Yes   Stair Management: One rail Right Number of Stairs: 8 General stair comments: ambulated up down 8 stairs with R railing with cga. Safe  technique performed with step to gait pattern. No buckling or LOB noted  Wheelchair Mobility    Modified Rankin (Stroke Patients Only)       Balance                                            Cognition Arousal/Alertness: Awake/alert Behavior During Therapy: WFL for tasks assessed/performed Overall Cognitive Status: Within Functional Limits for tasks assessed                                        Exercises Other Exercises Other Exercises: supine ther-ex performed on R LE including ankle pumps, quad sets, glut sets, heel slides, SAQ, and hip abd/add. All ther-ex performed x 15 reps with supervision and cues for technique.    General Comments        Pertinent Vitals/Pain Pain Assessment: 0-10 Pain Score: 7  Pain Location: R hip Pain Descriptors / Indicators: Discomfort;Operative site guarding Pain Intervention(s): Limited activity within patient's tolerance    Home Living                      Prior Function            PT Goals (current goals can now be found in the care plan section) Acute Rehab PT Goals Patient Stated Goal: Patient would like to go home and be able to do his normal routine PT Goal  Formulation: With patient Time For Goal Achievement: 11/05/16 Potential to Achieve Goals: Good Progress towards PT goals: Progressing toward goals    Frequency    BID      PT Plan Current plan remains appropriate    Co-evaluation              AM-PAC PT "6 Clicks" Daily Activity  Outcome Measure  Difficulty turning over in bed (including adjusting bedclothes, sheets and blankets)?: None Difficulty moving from lying on back to sitting on the side of the bed? : None Difficulty sitting down on and standing up from a chair with arms (e.g., wheelchair, bedside commode, etc,.)?: None Help needed moving to and from a bed to chair (including a wheelchair)?: None Help needed walking in hospital room?: None Help needed  climbing 3-5 steps with a railing? : A Little 6 Click Score: 23    End of Session Equipment Utilized During Treatment: Gait belt Activity Tolerance: Patient tolerated treatment well Patient left: in chair;with SCD's reapplied;with family/visitor present Nurse Communication: Mobility status PT Visit Diagnosis: Unsteadiness on feet (R26.81);Muscle weakness (generalized) (M62.81);Pain Pain - Right/Left: Right Pain - part of body: Hip     Time: 1884-1660 PT Time Calculation (min) (ACUTE ONLY): 23 min  Charges:  $Gait Training: 8-22 mins $Therapeutic Exercise: 8-22 mins                    G Codes:       Elizabeth Palau, PT, DPT [phone]    Tiffany Talarico 10/24/2016, 9:36 AM

## 2016-10-25 LAB — SURGICAL PATHOLOGY

## 2018-03-05 ENCOUNTER — Ambulatory Visit: Payer: Self-pay | Admitting: Gastroenterology

## 2018-03-30 ENCOUNTER — Ambulatory Visit: Payer: Medicaid Other | Admitting: Gastroenterology

## 2018-03-30 ENCOUNTER — Encounter: Payer: Self-pay | Admitting: Gastroenterology

## 2018-03-30 ENCOUNTER — Other Ambulatory Visit: Payer: Self-pay

## 2018-03-30 ENCOUNTER — Encounter

## 2018-03-30 VITALS — Ht 68.0 in | Wt 193.8 lb

## 2018-03-30 DIAGNOSIS — R1084 Generalized abdominal pain: Secondary | ICD-10-CM

## 2018-03-30 DIAGNOSIS — R109 Unspecified abdominal pain: Secondary | ICD-10-CM | POA: Diagnosis not present

## 2018-03-30 DIAGNOSIS — R197 Diarrhea, unspecified: Secondary | ICD-10-CM

## 2018-03-30 NOTE — Addendum Note (Signed)
Addended by: Daisy Blossom on: 03/30/2018 01:42 PM   Modules accepted: Orders

## 2018-03-30 NOTE — Addendum Note (Signed)
Addended by: Daisy Blossom on: 03/30/2018 01:52 PM   Modules accepted: Orders

## 2018-03-30 NOTE — Progress Notes (Signed)
[name] MD, MRCP(U.K) [address]  [address]  [place], [place] [address]  Main: [phone]  Fax: [phone]   Gastroenterology Consultation  Referring Provider:     Fredirick Lathe, MD Primary Care Physician:  [facility] Primary Gastroenterologist:  Dr. [name]  Reason for Consultation:     Abdominal pain and rectal bleeding.         HPI:   Jeffrey Grant is a 37 y.o. y/o male referred for consultation & management  by Dr. Eli Phillips, [facility].     He has been referred for abdominal pain and hematochezia.  No recent labs but the last set of labs available 1 year back showed hemoglobin 11.7 g and MCV of 87.7.  Rectal bleeding :  Onset and where was blood seen  :on and off a month - occurs not with every bowel movement , notices blood mixed on the stool.  Frequency of bowel movements :three times a day , mostly diarrhea or his stools are flecks  Consistency : more like clay  Change in shape of stool:none  Pain associated with bowel movements:no , did have some cramps in the past lower abdomen , after a bowel movement , bowel movements do not affect the pain, the pain is mostly in the night and his bowel movemnents are in the morning . No aggrevating factors or relieving factors for the abdominal pain. Has had the abdominal pain for a few years, getting worse and wakes him up at night . Also has some nausea.  Blood thinner usage:no  NSAID's: no  Prior colonoscopy :yes - twice and 4-5 years back and was normal  Family history of colon cancer or polyps:his grandmother had stomach cancer  Weight loss:yes - lost 10-12 lbs- unintentionally    He is a smoker.  On nmethotrexate for RA    Past Medical History:  Diagnosis Date  . Anxiety   . Atherosclerotic PVD with intermittent claudication (HCC)   . Back pain   . Dyspnea   . Dysrhythmia   . Elevated lipids   . Hypertension   . Nausea   . Seropositive rheumatoid arthritis  (HCC)   . Sjogren's syndrome [facility])     Past Surgical History:  Procedure Laterality Date  . HIP PINNING Right   . TOTAL HIP ARTHROPLASTY Right 10/22/2016   Procedure: TOTAL HIP ARTHROPLASTY ANTERIOR APPROACH;  Surgeon: Kennedy Bucker, MD;  Location: [facility] ORS;  Service: Orthopedics;  Laterality: Right;    Prior to Admission medications   Medication Sig Start Date End Date Taking? Authorizing Provider  amLODipine (NORVASC) 10 MG tablet Take 10 mg by mouth every evening. 11/06/09  Yes [provider]  atorvastatin (LIPITOR) 40 MG tablet Take 40 mg by mouth every evening.   Yes [provider]  folic acid (FOLVITE) 1 MG tablet Take 1 mg by mouth every evening. 09/09/16  Yes [provider]  hydrochlorothiazide (HYDRODIURIL) 25 MG tablet Take 25 mg by mouth daily.  09/03/10  Yes [provider]  lisinopril (PRINIVIL,ZESTRIL) 40 MG tablet Take 40 mg by mouth daily.  09/03/10  Yes [provider]  METHOTREXATE SODIUM IJ Inject 25 mg into the skin every Wednesday. 09/12/16  Yes [provider]  metoprolol succinate (TOPROL-XL) 100 MG 24 hr tablet Take 100 mg by mouth every evening. 09/03/10  Yes [provider]  traZODone (DESYREL) 100 MG tablet Take 100 mg by mouth at bedtime as needed for sleep. 07/07/12  Yes  [provider]  docusate sodium (COLACE) 100 MG capsule Take 1 tablet once or twice daily as needed for constipation while taking narcotic pain medicine Patient not taking: Reported on 10/04/2016 09/16/16   [name], MD  enoxaparin (LOVENOX) 40 MG/0.4ML injection Inject 0.4 mLs (40 mg total) into the skin daily. 10/25/16 11/08/16  [name], PA-C  meclizine (ANTIVERT) 25 MG tablet Take 25 mg by mouth 3 (three) times daily as needed for nausea.    [provider]  methocarbamol (ROBAXIN) 500 MG tablet Take 1 tablet (500 mg total) by mouth every 6 (six) hours as needed for muscle spasms. Patient not taking: Reported  on 03/30/2018 10/24/16   [name], PA-C  naproxen (NAPROSYN) 500 MG tablet Take 1 tablet (500 mg total) by mouth 2 (two) times daily with a meal. Patient not taking: Reported on 10/04/2016 09/07/16   [name], MD  oxyCODONE (OXY IR/ROXICODONE) 5 MG immediate release tablet Take 1-2 tablets (5-10 mg total) by mouth every 4 (four) hours as needed for breakthrough pain. Patient not taking: Reported on 03/30/2018 10/24/16   [name], PA-C  Vitamin D, Ergocalciferol, (DRISDOL) 50000 units CAPS capsule Take 50,000 Units by mouth every Wednesday.    [provider]    No family history on file.   Social History   Tobacco Use  . Smoking status: Current Every Day Smoker    Packs/day: 0.50    Years: 16.00    Pack years: 8.00  . Smokeless tobacco: Never Used  Substance Use Topics  . Alcohol use: No  . Drug use: Yes    Types: Marijuana    Allergies as of 03/30/2018  . (No Known Allergies)    Review of Systems:    All systems reviewed and negative except where noted in HPI.   Physical Exam:  Ht 5\' 8"  (1.727 m)   Wt 193 lb 12.8 oz (87.9 kg)   BMI 29.47 kg/m  No LMP for male patient. Psych:  Alert and cooperative. Normal mood and affect. General:   Alert,  Well-developed, well-nourished, pleasant and cooperative in NAD Head:  Normocephalic and atraumatic. Eyes:  Sclera clear, no icterus.   Conjunctiva pink. Ears:  Normal auditory acuity. Nose:  No deformity, discharge, or lesions. Mouth:  No deformity or lesions,oropharynx pink & moist. Neck:  Supple; no masses or thyromegaly. Lungs:  Respirations even and unlabored.  Clear throughout to auscultation.   No wheezes, crackles, or rhonchi. No acute distress. Heart:  Regular rate and rhythm; no murmurs, clicks, rubs, or gallops. Abdomen:  Normal bowel sounds.  No bruits.  Soft, non-tender and non-distended without masses, hepatosplenomegaly or hernias noted.  No guarding or rebound tenderness.    Msk:   Symmetrical without gross deformities. Good, equal movement & strength bilaterally. Pulses:  Normal pulses noted. Extremities:  No clubbing or edema.  No cyanosis. Neurologic:  Alert and oriented x3;  grossly normal neurologically. Skin:  Intact without significant lesions or rashes. No jaundice. Lymph Nodes:  No significant cervical adenopathy. Psych:  Alert and cooperative. Normal mood and affect.  Imaging Studies: No results found.  Assessment and Plan:   Jeffrey Grant is a 37 y.o. y/o male has been referred for hematochezia and abdominal pain.He has a history of RA which is an autoimmune condition and is a sister condition to IBD that affects the colon. With weight loss, rectal bleeding will rule out IBD,infectious colitis  Plan 1.  Check CBC.COMP,CRP 2.  H. pylori  breath test. 3. Check stool for PCR, calpropectin 4. EGD+colonoscopy for rectal bleeding ,diarrhea to r/o IBD as he has RA 5. Ct scan of the abdomen  6. Stop smoking    I have discussed alternative options, risks & benefits,  which include, but are not limited to, bleeding, infection, perforation,respiratory complication & drug reaction.  The patient agrees with this plan & written consent will be obtained.    Follow up in 4 weeks  Dr [name] MD,MRCP(U.K)

## 2018-03-31 LAB — CBC WITH DIFFERENTIAL/PLATELET
BASOS ABS: 0 10*3/uL (ref 0.0–0.2)
Basos: 0 %
EOS (ABSOLUTE): 0.3 10*3/uL (ref 0.0–0.4)
Eos: 3 %
HEMOGLOBIN: 14.4 g/dL (ref 13.0–17.7)
Hematocrit: 42.9 % (ref 37.5–51.0)
Immature Grans (Abs): 0.1 10*3/uL (ref 0.0–0.1)
Immature Granulocytes: 1 %
LYMPHS ABS: 2.8 10*3/uL (ref 0.7–3.1)
Lymphs: 31 %
MCH: 28.5 pg (ref 26.6–33.0)
MCHC: 33.6 g/dL (ref 31.5–35.7)
MCV: 85 fL (ref 79–97)
Monocytes Absolute: 0.6 10*3/uL (ref 0.1–0.9)
Monocytes: 6 %
NEUTROS ABS: 5.4 10*3/uL (ref 1.4–7.0)
Neutrophils: 59 %
PLATELETS: 321 10*3/uL (ref 150–450)
RBC: 5.06 x10E6/uL (ref 4.14–5.80)
RDW: 15.2 % (ref 12.3–15.4)
WBC: 9.1 10*3/uL (ref 3.4–10.8)

## 2018-03-31 LAB — COMPREHENSIVE METABOLIC PANEL
A/G RATIO: 1.7 (ref 1.2–2.2)
ALBUMIN: 4.6 g/dL (ref 3.5–5.5)
ALK PHOS: 79 IU/L (ref 39–117)
ALT: 37 IU/L (ref 0–44)
AST: 41 IU/L — ABNORMAL HIGH (ref 0–40)
BILIRUBIN TOTAL: 0.3 mg/dL (ref 0.0–1.2)
BUN / CREAT RATIO: 11 (ref 9–20)
BUN: 9 mg/dL (ref 6–20)
CHLORIDE: 101 mmol/L (ref 96–106)
CO2: 26 mmol/L (ref 20–29)
Calcium: 9.6 mg/dL (ref 8.7–10.2)
Creatinine, Ser: 0.84 mg/dL (ref 0.76–1.27)
GFR calc Af Amer: 129 mL/min/{1.73_m2} (ref >59)
GFR calc non Af Amer: 112 mL/min/{1.73_m2} (ref >59)
GLUCOSE: 98 mg/dL (ref 65–99)
Globulin, Total: 2.7 g/dL (ref 1.5–4.5)
POTASSIUM: 4.6 mmol/L (ref 3.5–5.2)
Sodium: 140 mmol/L (ref 134–144)
Total Protein: 7.3 g/dL (ref 6.0–8.5)

## 2018-03-31 LAB — C-REACTIVE PROTEIN: CRP: 16 mg/L — AB (ref 0–10)

## 2018-04-01 LAB — CALPROTECTIN, FECAL

## 2018-04-01 LAB — H. PYLORI BREATH TEST: H pylori Breath Test: NEGATIVE

## 2018-04-02 LAB — GI PROFILE, STOOL, PCR
Adenovirus F 40/41: NOT DETECTED
Astrovirus: NOT DETECTED
C difficile toxin A/B: NOT DETECTED
CRYPTOSPORIDIUM: NOT DETECTED
Campylobacter: NOT DETECTED
Cyclospora cayetanensis: NOT DETECTED
ENTEROPATHOGENIC E COLI: NOT DETECTED
ENTEROTOXIGENIC E COLI: NOT DETECTED
Entamoeba histolytica: NOT DETECTED
Enteroaggregative E coli: NOT DETECTED
Giardia lamblia: NOT DETECTED
NOROVIRUS GI/GII: NOT DETECTED
Plesiomonas shigelloides: NOT DETECTED
ROTAVIRUS A: NOT DETECTED
SALMONELLA: NOT DETECTED
SHIGA-TOXIN-PRODUCING E COLI: NOT DETECTED
Sapovirus: NOT DETECTED
Shigella/Enteroinvasive E coli: NOT DETECTED
VIBRIO: NOT DETECTED
Vibrio cholerae: NOT DETECTED
Yersinia enterocolitica: NOT DETECTED

## 2018-04-03 LAB — CLOSTRIDIUM DIFFICILE BY PCR: CDIFFPCR: NEGATIVE

## 2018-04-07 DIAGNOSIS — N400 Enlarged prostate without lower urinary tract symptoms: Secondary | ICD-10-CM | POA: Insufficient documentation

## 2018-04-07 DIAGNOSIS — F329 Major depressive disorder, single episode, unspecified: Secondary | ICD-10-CM | POA: Insufficient documentation

## 2018-04-07 DIAGNOSIS — F209 Schizophrenia, unspecified: Secondary | ICD-10-CM | POA: Insufficient documentation

## 2018-04-07 DIAGNOSIS — F32A Depression, unspecified: Secondary | ICD-10-CM | POA: Insufficient documentation

## 2018-04-07 DIAGNOSIS — N281 Cyst of kidney, acquired: Secondary | ICD-10-CM | POA: Insufficient documentation

## 2018-04-07 DIAGNOSIS — G4730 Sleep apnea, unspecified: Secondary | ICD-10-CM | POA: Insufficient documentation

## 2018-04-08 ENCOUNTER — Ambulatory Visit: Payer: Medicaid Other

## 2018-04-09 ENCOUNTER — Other Ambulatory Visit: Payer: Self-pay

## 2018-04-09 DIAGNOSIS — R197 Diarrhea, unspecified: Secondary | ICD-10-CM

## 2018-04-09 DIAGNOSIS — R109 Unspecified abdominal pain: Secondary | ICD-10-CM

## 2018-04-14 ENCOUNTER — Ambulatory Visit
Admission: RE | Admit: 2018-04-14 | Discharge: 2018-04-14 | Disposition: A | Discharge status: Home / Self Care | Payer: Medicaid Other | Source: Ambulatory Visit | Attending: Gastroenterology | Admitting: Gastroenterology

## 2018-04-14 DIAGNOSIS — Z96641 Presence of right artificial hip joint: Secondary | ICD-10-CM | POA: Diagnosis not present

## 2018-04-14 DIAGNOSIS — K439 Ventral hernia without obstruction or gangrene: Secondary | ICD-10-CM | POA: Insufficient documentation

## 2018-04-14 DIAGNOSIS — I700 Atherosclerosis of aorta: Secondary | ICD-10-CM | POA: Diagnosis not present

## 2018-04-14 DIAGNOSIS — K5730 Diverticulosis of large intestine without perforation or abscess without bleeding: Secondary | ICD-10-CM | POA: Diagnosis not present

## 2018-04-14 DIAGNOSIS — R197 Diarrhea, unspecified: Secondary | ICD-10-CM | POA: Diagnosis present

## 2018-04-14 DIAGNOSIS — R1084 Generalized abdominal pain: Secondary | ICD-10-CM | POA: Diagnosis not present

## 2018-04-14 HISTORY — DX: Systemic involvement of connective tissue, unspecified: M35.9

## 2018-04-14 IMAGING — CT CT ABD-PELV W/ CM
2 of 4 series · 15 of 46 positions shown, 17 images · IV contrast (iopamidol)
Comparison: [DATE]

CLINICAL DATA: Abdominal pain with nausea and diarrhea

EXAM:
CT ABDOMEN AND PELVIS WITH CONTRAST
TECHNIQUE: Multidetector CT imaging of the abdomen and pelvis was performed
using the standard protocol following bolus administration of
intravenous contrast.
CONTRAST:  100mL [RG] IOPAMIDOL ([RG]) INJECTION 61%

[Series 2: abd pelvis · axial · 0.68mm/px · z∈[-1693,-1273]mm · 12 of 92 slices shown, 14 images (1 of 2)]
[im 4/92  soft-tissue]
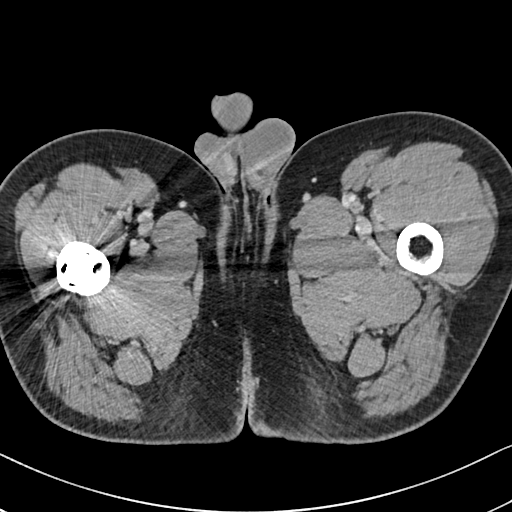
[im 4/92  bone]
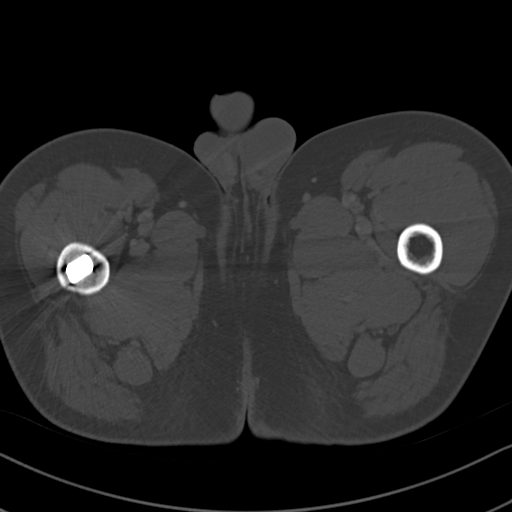
[im 12/92  soft-tissue]
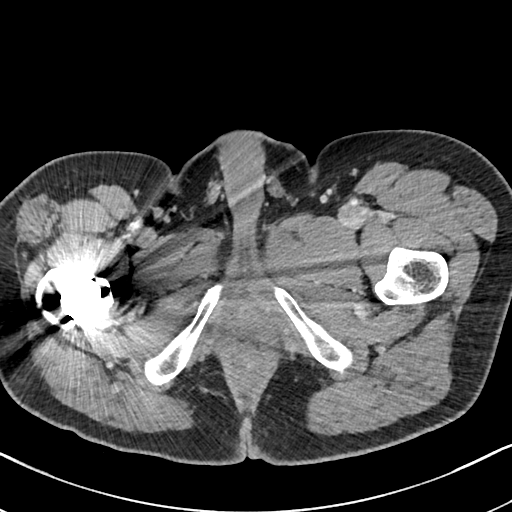
[im 20/92  soft-tissue]
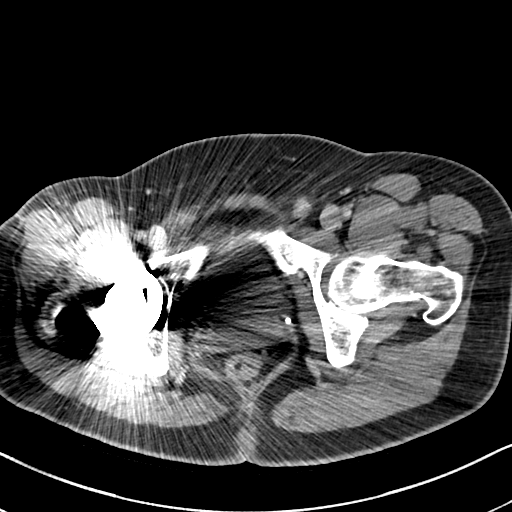
[im 28/92  soft-tissue]
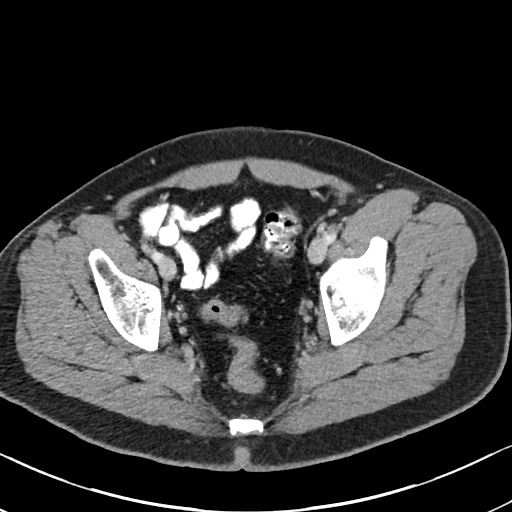
[im 36/92  soft-tissue]
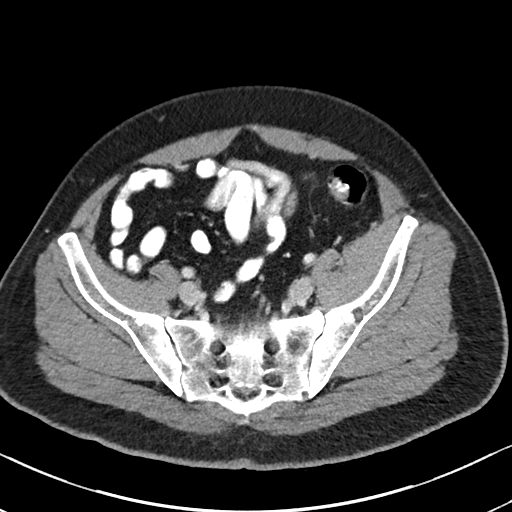
[im 44/92  soft-tissue]
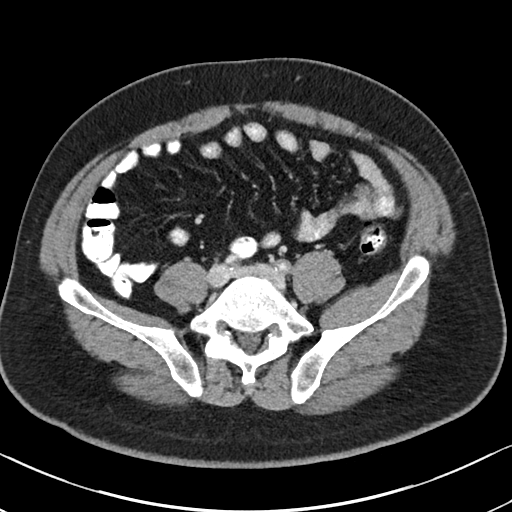
[im 48/92  soft-tissue]
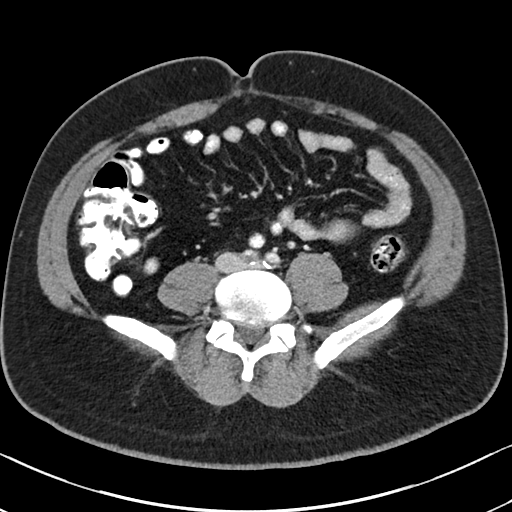
[im 56/92  soft-tissue]
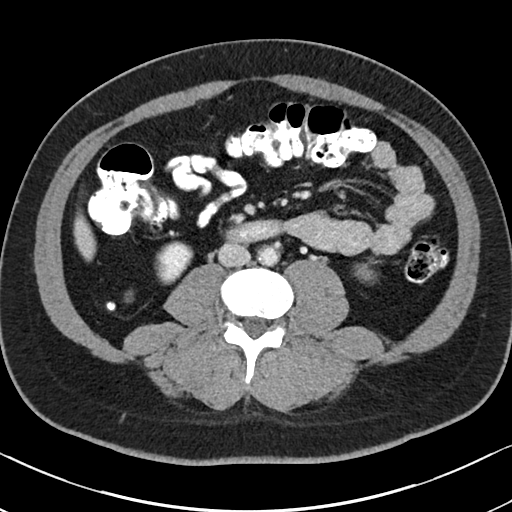
[im 64/92  soft-tissue]
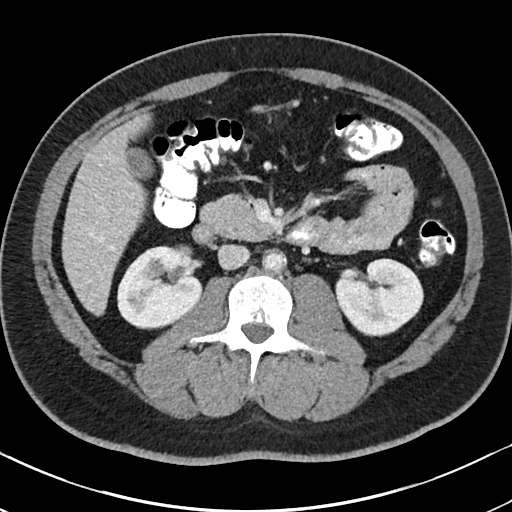
[im 64/92  bone]
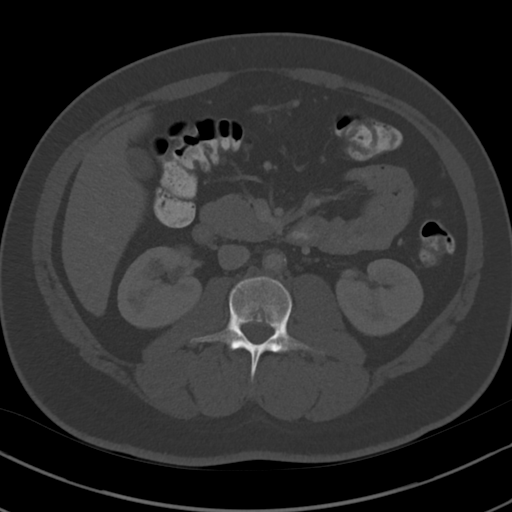
[im 72/92  soft-tissue]
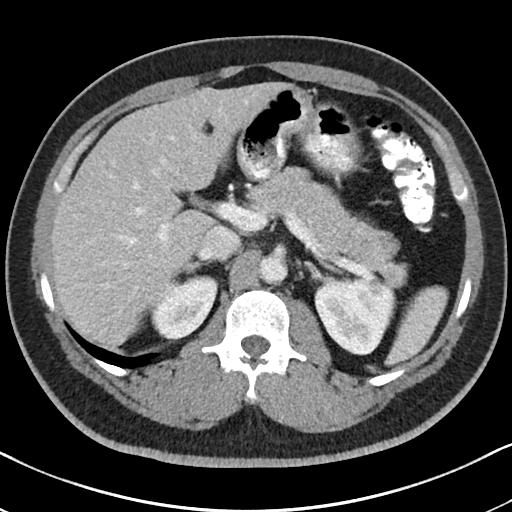
[im 80/92  soft-tissue]
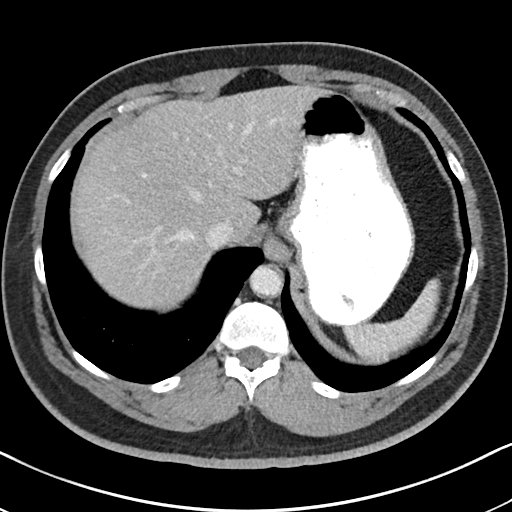
[im 88/92  soft-tissue]
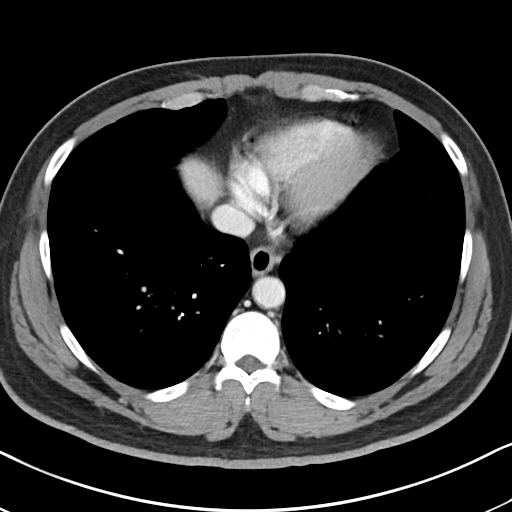

[Series 4: abd pelvis · coronal · 0.67mm/px · 3 of 150 slices shown (2 of 2)]
[im 50/150  soft-tissue]
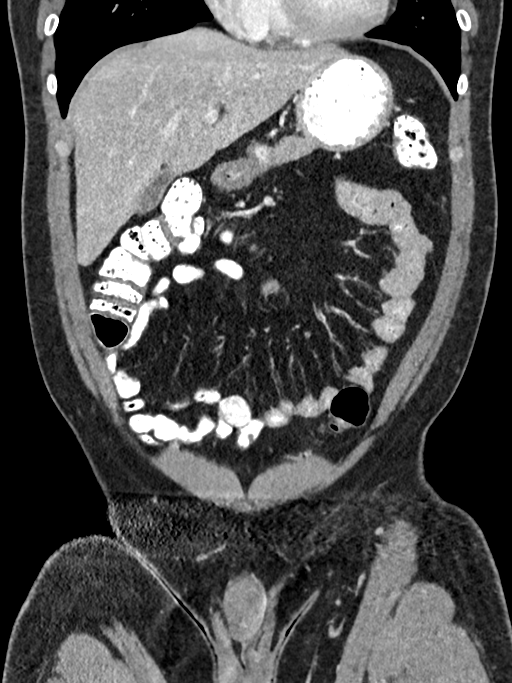
[im 67/150  soft-tissue]
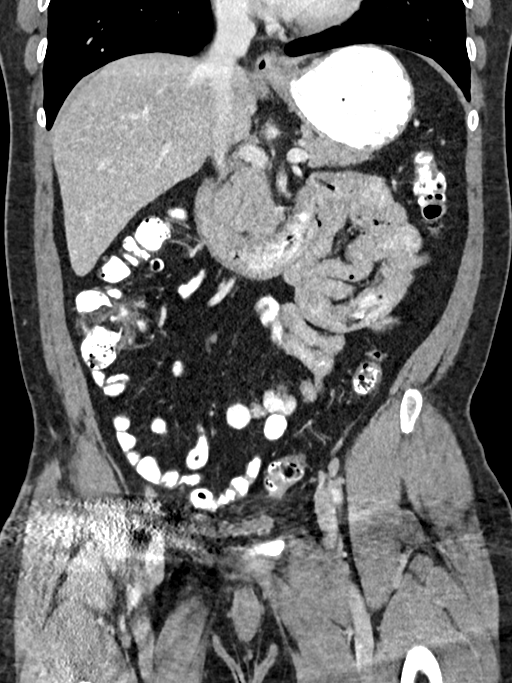
[im 83/150  soft-tissue]
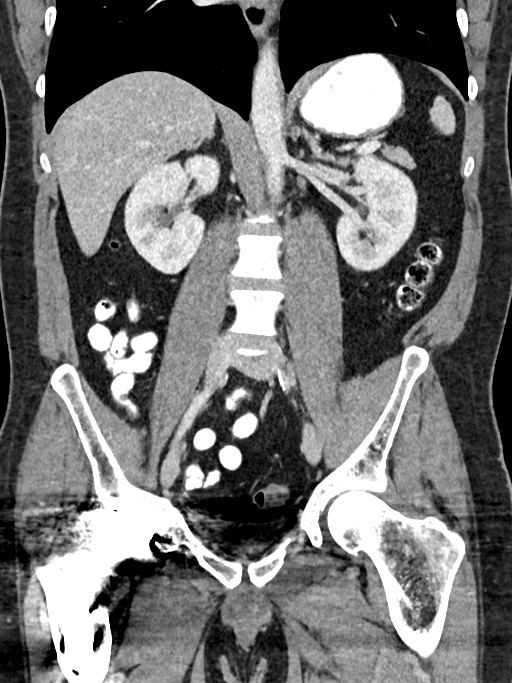

[15 of 46 positions shown; findings below may reference images not displayed]

FINDINGS: Lower chest: Lung bases are clear.

Hepatobiliary: No focal liver lesions are appreciable. Gallbladder
wall is not appreciably thickened. There is no biliary duct
dilatation.

Pancreas: No pancreatic mass or inflammatory focus.

Spleen: No splenic lesions are evident.

Adrenals/Urinary Tract: Adrenals bilaterally appear unremarkable.
There is a cyst arising from the lateral upper pole of the left
kidney measuring 1.4 x 1.4 cm. There is a cyst in the anterior upper
to mid right kidney measuring 1.2 x 1.2 cm. There is a cyst in the
lower pole left kidney measuring 0.9 x 0.9 cm. There is no evident
hydronephrosis on either side. There is no appreciable renal or
ureteral calculus on either side. Urinary bladder is midline with
wall thickness within normal limits.

Stomach/Bowel: There are sigmoid diverticula without diverticulitis.
There is no appreciable bowel wall or mesenteric thickening. No
evident bowel obstruction. No free air or portal venous air.

Vascular/Lymphatic: There is atherosclerotic plaque and
calcification in the aorta and common iliac arteries. Major
mesenteric arterial vessels appear patent. No aneurysm evident.
There is no appreciable adenopathy in the abdomen or pelvis.

Reproductive: Prostate and seminal vesicles appear normal in size
and contour. There is no evident pelvic mass.

Other: The appendix appears normal. There is no abscess or ascites
in the abdomen or pelvis. There is a minimal ventral hernia
containing only fat.

Musculoskeletal: Patient is status post total hip replacement on the
right. There are no blastic or lytic bone lesions. There is no
intramuscular or abdominal wall lesion.
IMPRESSION: 1. Scattered sigmoid diverticula without diverticulitis. No bowel
obstruction. No abscess in the abdomen or pelvis. Appendix appears
normal.

2. No evident renal or ureteral calculus. No hydronephrosis on
either side.

3.  Minimal ventral hernia containing only fat.

4.  Aortoiliac atherosclerosis.

5.  Status post total hip replacement on the right.

Aortic Atherosclerosis ([RG]-[RG]).

## 2018-04-14 MED ORDER — IOPAMIDOL (ISOVUE-300) INJECTION 61%
100.0000 mL | Freq: Once | INTRAVENOUS | Status: AC | PRN
  Administered 2018-04-14: 100 mL via INTRAVENOUS

## 2018-04-21 ENCOUNTER — Encounter
Admission: RE | Disposition: A | Discharge status: Home / Self Care | Payer: Self-pay | Source: Ambulatory Visit | Attending: Gastroenterology

## 2018-04-21 ENCOUNTER — Ambulatory Visit
Admission: RE | Admit: 2018-04-21 | Discharge: 2018-04-21 | Disposition: A | Discharge status: Home / Self Care | Payer: Medicaid Other | Source: Ambulatory Visit | Attending: Gastroenterology | Admitting: Gastroenterology

## 2018-04-21 ENCOUNTER — Ambulatory Visit: Payer: Medicaid Other | Admitting: Anesthesiology

## 2018-04-21 DIAGNOSIS — K2980 Duodenitis without bleeding: Secondary | ICD-10-CM | POA: Insufficient documentation

## 2018-04-21 DIAGNOSIS — R197 Diarrhea, unspecified: Secondary | ICD-10-CM | POA: Diagnosis not present

## 2018-04-21 DIAGNOSIS — R109 Unspecified abdominal pain: Secondary | ICD-10-CM

## 2018-04-21 DIAGNOSIS — K6289 Other specified diseases of anus and rectum: Secondary | ICD-10-CM | POA: Diagnosis present

## 2018-04-21 DIAGNOSIS — Z79899 Other long term (current) drug therapy: Secondary | ICD-10-CM | POA: Insufficient documentation

## 2018-04-21 DIAGNOSIS — I10 Essential (primary) hypertension: Secondary | ICD-10-CM | POA: Diagnosis not present

## 2018-04-21 DIAGNOSIS — F17210 Nicotine dependence, cigarettes, uncomplicated: Secondary | ICD-10-CM | POA: Insufficient documentation

## 2018-04-21 DIAGNOSIS — F419 Anxiety disorder, unspecified: Secondary | ICD-10-CM | POA: Insufficient documentation

## 2018-04-21 DIAGNOSIS — K3189 Other diseases of stomach and duodenum: Secondary | ICD-10-CM | POA: Insufficient documentation

## 2018-04-21 HISTORY — PX: COLONOSCOPY WITH PROPOFOL: SHX5780

## 2018-04-21 HISTORY — PX: ESOPHAGOGASTRODUODENOSCOPY (EGD) WITH PROPOFOL: SHX5813

## 2018-04-21 SURGERY — ESOPHAGOGASTRODUODENOSCOPY (EGD) WITH PROPOFOL
Anesthesia: General

## 2018-04-21 MED ORDER — SODIUM CHLORIDE 0.9 % IV SOLN
INTRAVENOUS | Status: DC
  Administered 2018-04-21: 1000 mL via INTRAVENOUS

## 2018-04-21 MED ORDER — PROPOFOL 500 MG/50ML IV EMUL
INTRAVENOUS | Status: AC
  Filled 2018-04-21: qty 50

## 2018-04-21 MED ORDER — MIDAZOLAM HCL 2 MG/2ML IJ SOLN
INTRAMUSCULAR | Status: DC | PRN
  Administered 2018-04-21: 2 mg via INTRAVENOUS

## 2018-04-21 MED ORDER — ONDANSETRON HCL 4 MG/2ML IJ SOLN
INTRAMUSCULAR | Status: DC | PRN
  Administered 2018-04-21: 4 mg via INTRAVENOUS

## 2018-04-21 MED ORDER — MIDAZOLAM HCL 2 MG/2ML IJ SOLN
INTRAMUSCULAR | Status: AC
  Filled 2018-04-21: qty 2

## 2018-04-21 MED ORDER — LIDOCAINE HCL (PF) 2 % IJ SOLN
INTRAMUSCULAR | Status: AC
  Filled 2018-04-21: qty 10

## 2018-04-21 MED ORDER — GLYCOPYRROLATE 0.2 MG/ML IJ SOLN
INTRAMUSCULAR | Status: AC
  Filled 2018-04-21: qty 1

## 2018-04-21 MED ORDER — ONDANSETRON HCL 4 MG/2ML IJ SOLN
INTRAMUSCULAR | Status: AC
  Filled 2018-04-21: qty 2

## 2018-04-21 MED ORDER — GLYCOPYRROLATE 0.2 MG/ML IJ SOLN
INTRAMUSCULAR | Status: DC | PRN
  Administered 2018-04-21: 0.2 mg via INTRAVENOUS

## 2018-04-21 MED ORDER — FENTANYL CITRATE (PF) 100 MCG/2ML IJ SOLN
INTRAMUSCULAR | Status: AC
  Filled 2018-04-21: qty 2

## 2018-04-21 MED ORDER — LIDOCAINE HCL (CARDIAC) PF 100 MG/5ML IV SOSY
PREFILLED_SYRINGE | INTRAVENOUS | Status: DC | PRN
  Administered 2018-04-21: 25 mg via INTRAVENOUS

## 2018-04-21 MED ORDER — PHENYLEPHRINE HCL 10 MG/ML IJ SOLN
INTRAMUSCULAR | Status: DC | PRN
  Administered 2018-04-21 (×2): 100 ug via INTRAVENOUS

## 2018-04-21 MED ORDER — PROPOFOL 500 MG/50ML IV EMUL
INTRAVENOUS | Status: DC | PRN
  Administered 2018-04-21: 150 ug/kg/min via INTRAVENOUS

## 2018-04-21 MED ORDER — FENTANYL CITRATE (PF) 100 MCG/2ML IJ SOLN
INTRAMUSCULAR | Status: DC | PRN
  Administered 2018-04-21: 50 ug via INTRAVENOUS

## 2018-04-21 MED ORDER — PROPOFOL 10 MG/ML IV BOLUS
INTRAVENOUS | Status: DC | PRN
  Administered 2018-04-21: 50 mg via INTRAVENOUS
  Administered 2018-04-21: 20 mg via INTRAVENOUS

## 2018-04-21 NOTE — H&P (Signed)
[name], MD [address], [place], [place], [address] [address], [place], [place], [address] Phone: [phone]  Fax: [phone]  Primary Care Physician:  [facility]   Pre-Procedure History & Physical: HPI:  Jeffrey Grant. is a 37 y.o. male is here for an endoscopy and colonoscopy    Past Medical History:  Diagnosis Date  . Anxiety   . Atherosclerotic PVD with intermittent claudication (HCC)   . Back pain   . Collagen vascular disease (HCC)   . Dyspnea   . Dysrhythmia   . Elevated lipids   . Hypertension   . Nausea   . Seropositive rheumatoid arthritis (HCC)     Past Surgical History:  Procedure Laterality Date  . HIP PINNING Right   . TOTAL HIP ARTHROPLASTY Right 10/22/2016   Procedure: TOTAL HIP ARTHROPLASTY ANTERIOR APPROACH;  Surgeon: Kennedy Bucker, MD;  Location: [facility] ORS;  Service: Orthopedics;  Laterality: Right;    Prior to Admission medications   Medication Sig Start Date End Date Taking? Authorizing Provider  amLODipine (NORVASC) 10 MG tablet Take 10 mg by mouth every evening. 11/06/09  Yes [provider]  atorvastatin (LIPITOR) 40 MG tablet Take 40 mg by mouth every evening.   Yes [provider]  buPROPion (WELLBUTRIN SR) 150 MG 12 hr tablet  03/23/18  Yes [provider]  carvedilol (COREG) 12.5 MG tablet Take 12.5 mg by mouth 2 (two) times daily with a meal.   Yes [provider]  diazepam (VALIUM) 5 MG tablet  03/23/18  Yes [provider]  docusate sodium (COLACE) 100 MG capsule Take 1 tablet once or twice daily as needed for constipation while taking narcotic pain medicine 09/16/16  Yes [name], MD  folic acid (FOLVITE) 1 MG tablet Take 1 mg by mouth every evening. 09/09/16  Yes [provider]  hydrochlorothiazide (HYDRODIURIL) 25 MG tablet Take 25 mg by mouth daily.  09/03/10  Yes [provider]  lisinopril  (PRINIVIL,ZESTRIL) 40 MG tablet Take 40 mg by mouth daily.  09/03/10  Yes [provider]  meclizine (ANTIVERT) 25 MG tablet Take 25 mg by mouth 3 (three) times daily as needed for nausea.   Yes [provider]  methocarbamol (ROBAXIN) 500 MG tablet Take 1 tablet (500 mg total) by mouth every 6 (six) hours as needed for muscle spasms. 10/24/16  Yes [name], PA-C  METHOTREXATE SODIUM IJ Inject 25 mg into the skin every Wednesday. 09/12/16  Yes [provider]  naproxen (NAPROSYN) 500 MG tablet Take 1 tablet (500 mg total) by mouth 2 (two) times daily with a meal. 09/07/16  Yes [name], MD  oxyCODONE (OXY IR/ROXICODONE) 5 MG immediate release tablet Take 1-2 tablets (5-10 mg total) by mouth every 4 (four) hours as needed for breakthrough pain. 10/24/16  Yes [name], PA-C  QUEtiapine (SEROQUEL) 100 MG tablet  03/23/18  Yes [provider]  traZODone (DESYREL) 100 MG tablet Take 100 mg by mouth at bedtime as needed for sleep. 07/07/12  Yes [provider]  Vitamin D, Ergocalciferol, (DRISDOL) 50000 units CAPS capsule Take 50,000 Units by mouth every Wednesday.   Yes [provider]  enoxaparin (LOVENOX) 40 MG/0.4ML injection Inject 0.4 mLs (40 mg total) into the skin daily. 10/25/16 11/08/16  [name], PA-C  metoprolol succinate (TOPROL-XL) 100 MG 24 hr tablet Take 100 mg by mouth every evening.  09/03/10   [provider]    Allergies as of 04/09/2018  . (No Known Allergies)    History reviewed. No pertinent family history.  Social History   Socioeconomic History  . Marital status: Married    Spouse name: Not on file  . Number of children: Not on file  . Years of education: Not on file  . Highest education level: Not on file  Occupational History  . Not on file  Social Needs  . Financial resource strain: Not on file  . Food insecurity:    Worry: Not on file    Inability: Not on file  . Transportation  needs:    Medical: Not on file    Non-medical: Not on file  Tobacco Use  . Smoking status: Current Every Day Smoker    Packs/day: 0.50    Years: 16.00    Pack years: 8.00  . Smokeless tobacco: Never Used  Substance and Sexual Activity  . Alcohol use: No  . Drug use: Yes    Types: Marijuana  . Sexual activity: Not on file  Lifestyle  . Physical activity:    Days per week: Not on file    Minutes per session: Not on file  . Stress: Not on file  Relationships  . Social connections:    Talks on phone: Not on file    Gets together: Not on file    Attends religious service: Not on file    Active member of club or organization: Not on file    Attends meetings of clubs or organizations: Not on file    Relationship status: Not on file  . Intimate partner violence:    Fear of current or ex partner: Not on file    Emotionally abused: Not on file    Physically abused: Not on file    Forced sexual activity: Not on file  Other Topics Concern  . Not on file  Social History Narrative  . Not on file    Review of Systems: See HPI, otherwise negative ROS  Physical Exam: BP (!) 141/100   Pulse 81   Temp (!) 96.4 F (35.8 C) (Tympanic)   Resp 20   Ht 5\' 9"  (1.753 m)   Wt 86.2 kg   SpO2 100%   BMI 28.06 kg/m  General:   Alert,  pleasant and cooperative in NAD Head:  Normocephalic and atraumatic. Neck:  Supple; no masses or thyromegaly. Lungs:  Clear throughout to auscultation, normal respiratory effort.    Heart:  +S1, +S2, Regular rate and rhythm, No edema. Abdomen:  Soft, nontender and nondistended. Normal bowel sounds, without guarding, and without rebound.   Neurologic:  Alert and  oriented x4;  grossly normal neurologically.  Impression/Plan: . is here for an endoscopy and colonoscopy  to be performed for  evaluation of abdominal pain and diarrhea     Risks, benefits, limitations, and alternatives regarding endoscopy have been reviewed with the patient.   Questions have been answered.  All parties agreeable.   [name], MD  04/21/2018, 9:08 AM

## 2018-04-21 NOTE — Transfer of Care (Signed)
Immediate Anesthesia Transfer of Care Note  Patient: Jeffrey Grant.  Procedure(s) Performed: ESOPHAGOGASTRODUODENOSCOPY (EGD) WITH PROPOFOL (N/A ) COLONOSCOPY WITH PROPOFOL (N/A )  Patient Location: PACU  Anesthesia Type:General  Level of Consciousness: drowsy  Airway & Oxygen Therapy: Patient Spontanous Breathing and Patient connected to face mask oxygen  Post-op Assessment: Report given to RN and Post -op Vital signs reviewed and stable  Post vital signs: Reviewed and stable  Last Vitals:  Vitals Value Taken Time  BP 95/84 04/21/2018  9:51 AM  Temp 36.1 C 04/21/2018  9:51 AM  Pulse 89 04/21/2018  9:51 AM  Resp 16 04/21/2018  9:51 AM  SpO2 100 % 04/21/2018  9:51 AM    Last Pain:  Vitals:   04/21/18 0951  TempSrc: Tympanic  PainSc: Asleep         Complications: No apparent anesthesia complications

## 2018-04-21 NOTE — Anesthesia Preprocedure Evaluation (Signed)
Anesthesia Evaluation  Patient identified by MRN, date of birth, ID band Patient awake    Reviewed: Allergy & Precautions, H&P , NPO status , Patient's Chart, lab work & pertinent test results  History of Anesthesia Complications Negative for: history of anesthetic complications  Airway Mallampati: III  TM Distance: >3 FB Neck ROM: full    Dental  (+) Chipped, Poor Dentition   Pulmonary neg shortness of breath, sleep apnea , Current Smoker,           Cardiovascular Exercise Tolerance: Good hypertension, (-) angina+ Peripheral Vascular Disease  (-) Past MI and (-) DOE + dysrhythmias      Neuro/Psych PSYCHIATRIC DISORDERS negative neurological ROS     GI/Hepatic negative GI ROS, Neg liver ROS, neg GERD  ,  Endo/Other  negative endocrine ROS  Renal/GU Renal disease  negative genitourinary   Musculoskeletal  (+) Arthritis ,   Abdominal   Peds  Hematology negative hematology ROS (+)   Anesthesia Other Findings Past Medical History: No date: Anxiety No date: Atherosclerotic PVD with intermittent claudication (HCC) No date: Back pain No date: Collagen vascular disease (HCC) No date: Dyspnea No date: Dysrhythmia No date: Elevated lipids No date: Hypertension No date: Nausea No date: Seropositive rheumatoid arthritis (HCC)  Past Surgical History: No date: HIP PINNING; Right 10/22/2016: TOTAL HIP ARTHROPLASTY; Right     Comment:  Procedure: TOTAL HIP ARTHROPLASTY ANTERIOR APPROACH;                Surgeon: Kennedy Bucker, MD;  Location: [facility] ORS;  Service:              Orthopedics;  Laterality: Right;  BMI    Body Mass Index:  28.06 kg/m      Reproductive/Obstetrics negative OB ROS                             Anesthesia Physical Anesthesia Plan  ASA: III  Anesthesia Plan: General   Post-op Pain Management:    Induction: Intravenous  PONV Risk Score and Plan: Propofol infusion  and TIVA  Airway Management Planned: Natural Airway and Nasal Cannula  Additional Equipment:   Intra-op Plan:   Post-operative Plan:   Informed Consent: I have reviewed the patients History and Physical, chart, labs and discussed the procedure including the risks, benefits and alternatives for the proposed anesthesia with the patient or authorized representative who has indicated his/her understanding and acceptance.   Dental Advisory Given  Plan Discussed with: Anesthesiologist, CRNA and Surgeon  Anesthesia Plan Comments: (Patient consented for risks of anesthesia including but not limited to:  - adverse reactions to medications - risk of intubation if required - damage to teeth, lips or other oral mucosa - sore throat or hoarseness - Damage to heart, brain, lungs or loss of life  Patient voiced understanding.)        Anesthesia Quick Evaluation

## 2018-04-21 NOTE — Op Note (Signed)
[facility] Gastroenterology Patient Name: Jeffrey Grant Procedure Date: 04/21/2018 9:07 AM MRN: [id number] Account #: [id number] Date of Birth: 1981/04/11 Admit Type: Outpatient Age: 37 Room: [facility] ENDO ROOM 1 Gender: Male Note Status: Finalized Procedure:            Colonoscopy Indications:          Clinically significant diarrhea of unexplained origin Providers:            [name] MD, MD Medicines:            Monitored Anesthesia Care Complications:        No immediate complications. Procedure:            Pre-Anesthesia Assessment:                       - Prior to the procedure, a History and Physical was                        performed, and patient medications, allergies and                        sensitivities were reviewed. The patient's tolerance of                        previous anesthesia was reviewed.                       - The risks and benefits of the procedure and the                        sedation options and risks were discussed with the                        patient. All questions were answered and informed                        consent was obtained.                       - ASA Grade Assessment: II - A patient with mild                        systemic disease.                       After obtaining informed consent, the colonoscope was                        passed under direct vision. Throughout the procedure,                        the patient's blood pressure, pulse, and oxygen                        saturations were monitored continuously. The                        Colonoscope was introduced through the anus and                        advanced to the the terminal ileum. The colonoscopy was  performed with ease. The patient tolerated the                        procedure well. The quality of the bowel preparation                        was good. Findings:      The digital rectal exam findings include decreased  sphincter tone.       Pertinent negatives include no palpable rectal lesions.      The terminal ileum appeared normal. Biopsies were taken with a cold       forceps for histology.      The colon (entire examined portion) appeared normal. Biopsies were taken       with a cold forceps for histology.      The exam was otherwise without abnormality on direct and retroflexion       views. Impression:           - Decreased sphincter tone found on digital rectal exam.                       - The examined portion of the ileum was normal.                        Biopsied.                       - The entire examined colon is normal. Biopsied.                       - The examination was otherwise normal on direct and                        retroflexion views. Recommendation:       - Discharge patient to home (with escort).                       - Resume previous diet.                       - Await pathology results.                       - Return to my office in 2 weeks. Procedure Code(s):    --- Professional ---                       [phone], Colonoscopy, flexible; with biopsy, single or                        multiple Diagnosis Code(s):    --- Professional ---                       K62.89, Other specified diseases of anus and rectum                       R19.7, Diarrhea, unspecified CPT copyright 2018 American Medical Association. All rights reserved. The codes documented in this report are preliminary and upon coder review may  be revised to meet current compliance requirements. [name], MD [name] MD, MD 04/21/2018 9:46:08 AM This report has been signed electronically. Number of Addenda: 0 Note Initiated On:  04/21/2018 9:07 AM Scope Withdrawal Time: 0 hours 8 minutes 38 seconds  Total Procedure Duration: 0 hours 9 minutes 35 seconds       [facility]

## 2018-04-21 NOTE — Anesthesia Postprocedure Evaluation (Signed)
Anesthesia Post Note  Patient: Jeffrey Grant.  Procedure(s) Performed: ESOPHAGOGASTRODUODENOSCOPY (EGD) WITH PROPOFOL (N/A ) COLONOSCOPY WITH PROPOFOL (N/A )  Patient location during evaluation: Endoscopy Anesthesia Type: General Level of consciousness: awake and alert Pain management: pain level controlled Vital Signs Assessment: post-procedure vital signs reviewed and stable Respiratory status: spontaneous breathing, nonlabored ventilation, respiratory function stable and patient connected to nasal cannula oxygen Cardiovascular status: blood pressure returned to baseline and stable Postop Assessment: no apparent nausea or vomiting Anesthetic complications: no     Last Vitals:  Vitals:   04/21/18 1011 04/21/18 1021  BP: 128/86 (!) 142/89  Pulse: 77 75  Resp: (!) 21 (!) 22  Temp:    SpO2: 100% 100%    Last Pain:  Vitals:   04/21/18 1021  TempSrc:   PainSc: 0-No pain                 Cleda Mccreedy Daryl Beehler

## 2018-04-21 NOTE — Anesthesia Post-op Follow-up Note (Signed)
Anesthesia QCDR form completed.        

## 2018-04-21 NOTE — Op Note (Signed)
[facility] Gastroenterology Patient Name: Jeffrey Grant Procedure Date: 04/21/2018 9:08 AM MRN: [id number] Account #: [id number] Date of Birth: 1980/10/17 Admit Type: Outpatient Age: 37 Room: [facility] ENDO ROOM 1 Gender: Male Note Status: Finalized Procedure:            Upper GI endoscopy Indications:          Abdominal pain Providers:            [name] MD, MD Referring MD:         [facility], MD (Referring MD) Medicines:            Monitored Anesthesia Care Complications:        No immediate complications. Procedure:            Pre-Anesthesia Assessment:                       - Prior to the procedure, a History and Physical was                        performed, and patient medications, allergies and                        sensitivities were reviewed. The patient's tolerance of                        previous anesthesia was reviewed.                       - The risks and benefits of the procedure and the                        sedation options and risks were discussed with the                        patient. All questions were answered and informed                        consent was obtained.                       - ASA Grade Assessment: II - A patient with mild                        systemic disease.                       After obtaining informed consent, the endoscope was                        passed under direct vision. Throughout the procedure,                        the patient's blood pressure, pulse, and oxygen                        saturations were monitored continuously. The Endoscope                        was introduced through the mouth, and advanced to the  third part of duodenum. The upper GI endoscopy was                        accomplished with ease. The patient tolerated the                        procedure well. Findings:      The esophagus was normal.      The entire examined stomach was  normal. Biopsies were taken with a cold       forceps for histology.      The examined duodenum was normal. Biopsies were taken with a cold       forceps for histology. Impression:           - Normal esophagus.                       - Normal stomach. Biopsied.                       - Normal examined duodenum. Biopsied. Recommendation:       - Await pathology results.                       - Perform a colonoscopy today. Procedure Code(s):    --- Professional ---                       [phone], Esophagogastroduodenoscopy, flexible, transoral;                        with biopsy, single or multiple Diagnosis Code(s):    --- Professional ---                       R10.9, Unspecified abdominal pain CPT copyright 2018 American Medical Association. All rights reserved. The codes documented in this report are preliminary and upon coder review may  be revised to meet current compliance requirements. [name], MD [name] MD, MD 04/21/2018 9:30:39 AM This report has been signed electronically. Number of Addenda: 0 Note Initiated On: 04/21/2018 9:08 AM      [facility]

## 2018-04-22 ENCOUNTER — Encounter: Payer: Self-pay | Admitting: Gastroenterology

## 2018-04-25 LAB — SURGICAL PATHOLOGY

## 2018-04-26 ENCOUNTER — Encounter: Payer: Self-pay | Admitting: Gastroenterology

## 2018-04-28 ENCOUNTER — Telehealth: Payer: Self-pay

## 2018-04-28 NOTE — Telephone Encounter (Signed)
-----   Message from [name], MD sent at 04/19/2018 12:19 PM EDT ----- Inform no gross abnormality on CT scan - mostly sigmoid diverticula

## 2018-04-28 NOTE — Telephone Encounter (Signed)
Spoke with pt and informed him of CT scan results. Pt has concerns about the finding of multiple cysts on his kidneys. Pt states he's only known of having 1 cyst. Pt states he was seeing a nephrologist in the past. Pt requests Dr. Johnney Killian opinion on he should do. I explained that I will consult with Dr. Tobi Bastos and then advise.

## 2018-05-04 NOTE — Telephone Encounter (Signed)
-----   Message from [name], MD sent at 05/01/2018  8:09 AM EST ----- There is no issues of concern of the cysts on the CT scan. He can discuss further with his nephrologist and if wants to can repeat an ultrasound in 6 months  [name] ----- Message ----- From: Isa Rankin, CMA Sent: 04/28/2018   5:03 PM EST To: [name], MD  Pt is concerned about the findings of multiple cysts on his kidneys in the CT report. Pt states he's only known about one. Pt has seen nephrologist at [facility] in the past. He is requesting your opinion on what he should do.  Jeffrey Grant ----- Message ----- From: [name], MD Sent: 04/19/2018  12:19 PM EST To: Isa Rankin, CMA  Inform no gross abnormality on CT scan - mostly sigmoid diverticula

## 2018-05-04 NOTE — Telephone Encounter (Signed)
Spoke with pt and informed him that Dr. Tobi Bastos states there are no issues of concern for the cyst on the CT scan. Also informed pt of Dr. Johnney Killian instructions to follow up with his nephrologist for further evaluation. Pt understands and agrees.

## 2018-05-13 ENCOUNTER — Ambulatory Visit (INDEPENDENT_AMBULATORY_CARE_PROVIDER_SITE_OTHER): Payer: Medicaid Other | Admitting: Gastroenterology

## 2018-05-13 ENCOUNTER — Encounter: Payer: Self-pay | Admitting: Gastroenterology

## 2018-05-13 VITALS — BP 135/90 | HR 96 | Ht 68.0 in | Wt 194.6 lb

## 2018-05-13 DIAGNOSIS — K580 Irritable bowel syndrome with diarrhea: Secondary | ICD-10-CM | POA: Diagnosis not present

## 2018-05-13 DIAGNOSIS — R140 Abdominal distension (gaseous): Secondary | ICD-10-CM

## 2018-05-13 NOTE — Progress Notes (Signed)
[name] MD, MRCP(U.K) [address]  [address]  [place], [place] [address]  Main: [phone]  Fax: [phone]   Primary Care Physician: [facility]  Primary Gastroenterologist:  Dr. [name]   Chief Complaint  Patient presents with  . Follow-up    HPI: Jeffrey Mackert. is a 37 y.o. male   Summary of history :  He was initially referred and seen on 03/30/2018 for abdominal pain and rectal bleeding.  Rectal bleeding began a month earlier and occurred on and off with blood mixed with the stool.  He had bowel movements 3 times a day mostly diarrhea or his stools with flecks.  Consistently more likely.  Has had some cramps in the lower abdomen after bowel movement.  No aggravating or relieving factors.  Has had abdominal pain ongoing for a few years which has been getting worse.  At his last visit he mentioned he had lost some weight unintentionally.  Interval history   03/30/2018 to 05/13/2018  03/30/2018: Hemoglobin 14.4 g, stool GI PCR negative, stool C. difficile negative, fecal calprotectin normal, CMP normal, CRP mildly elevated at 16, H. pylori breath test negative   04/14/2018: CT scan of the abdomen and pelvis with contrast: Sigmoid diverticulosis, normal ventral hernia.  04/21/2018: Upper endoscopy: Normal, biopsies of the duodenum showed mild chronic active duodenitis, biopsies of stomach showed mild reactive gastritis.  Colonoscopy normal terminal ileum biopsies, random colon biopsies were normal.  Rectal biopsies showed no gross abnormalities.   Weighs 194 -no change in weight  Abdominal pain is better: not taking any pain meds. Still has diarrhea 3 times a day - soft in nature.Bloating is not as bad.   Current Outpatient Medications  Medication Sig Dispense Refill  . amLODipine (NORVASC) 10 MG tablet Take 10 mg by mouth every evening.    Marland Kitchen atorvastatin (LIPITOR) 40 MG tablet Take 40 mg by mouth every evening.    Marland Kitchen buPROPion  (WELLBUTRIN SR) 150 MG 12 hr tablet   0  . carvedilol (COREG) 12.5 MG tablet Take 12.5 mg by mouth 2 (two) times daily with a meal.    . diazepam (VALIUM) 5 MG tablet   2  . docusate sodium (COLACE) 100 MG capsule Take 1 tablet once or twice daily as needed for constipation while taking narcotic pain medicine 30 capsule 0  . folic acid (FOLVITE) 1 MG tablet Take 1 mg by mouth every evening.    . hydrochlorothiazide (HYDRODIURIL) 25 MG tablet Take 25 mg by mouth daily.     Marland Kitchen lisinopril (PRINIVIL,ZESTRIL) 40 MG tablet Take 40 mg by mouth daily.     . meclizine (ANTIVERT) 25 MG tablet Take 25 mg by mouth 3 (three) times daily as needed for nausea.    . methocarbamol (ROBAXIN) 500 MG tablet Take 1 tablet (500 mg total) by mouth every 6 (six) hours as needed for muscle spasms. 40 tablet 0  . METHOTREXATE SODIUM IJ Inject 25 mg into the skin every Wednesday.    . metoprolol succinate (TOPROL-XL) 100 MG 24 hr tablet Take 100 mg by mouth every evening.    Marland Kitchen QUEtiapine (SEROQUEL) 100 MG tablet   0  . traZODone (DESYREL) 100 MG tablet Take 100 mg by mouth at bedtime as needed for sleep.    . Vitamin D, Ergocalciferol, (DRISDOL) 50000 units CAPS capsule Take 50,000 Units by mouth every Wednesday.    . enoxaparin (LOVENOX) 40 MG/0.4ML injection Inject 0.4 mLs (40 mg total) into  the skin daily. 14 Syringe 0  . naproxen (NAPROSYN) 500 MG tablet Take 1 tablet (500 mg total) by mouth 2 (two) times daily with a meal. (Patient not taking: Reported on 05/13/2018) 20 tablet 2  . oxyCODONE (OXY IR/ROXICODONE) 5 MG immediate release tablet Take 1-2 tablets (5-10 mg total) by mouth every 4 (four) hours as needed for breakthrough pain. (Patient not taking: Reported on 05/13/2018) 40 tablet 0   No current facility-administered medications for this visit.     Allergies as of 05/13/2018  . (No Known Allergies)    ROS:  General: Negative for anorexia, weight loss, fever, chills, fatigue, weakness. ENT: Negative for  hoarseness, difficulty swallowing , nasal congestion. CV: Negative for chest pain, angina, palpitations, dyspnea on exertion, peripheral edema.  Respiratory: Negative for dyspnea at rest, dyspnea on exertion, cough, sputum, wheezing.  GI: See history of present illness. GU:  Negative for dysuria, hematuria, urinary incontinence, urinary frequency, nocturnal urination.  Endo: Negative for unusual weight change.    Physical Examination:   BP 135/90 (BP Location: Left Arm, Patient Position: Sitting, Cuff Size: Large)   Pulse 96   Ht 5\' 8"  (1.727 m)   Wt 194 lb 9.6 oz (88.3 kg)   BMI 29.59 kg/m   General: Well-nourished, well-developed in no acute distress.  Eyes: No icterus. Conjunctivae pink. Mouth: Oropharyngeal mucosa moist and pink , no lesions erythema or exudate. Lungs: Clear to auscultation bilaterally. Non-labored. Heart: Regular rate and rhythm, no murmurs rubs or gallops.  Abdomen: Bowel sounds are normal, nontender, nondistended, no hepatosplenomegaly or masses, no abdominal bruits or hernia , no rebound or guarding.   Extremities: No lower extremity edema. No clubbing or deformities. Neuro: Alert and oriented x 3.  Grossly intact. Skin: Warm and dry, no jaundice.   Psych: Alert and cooperative, normal mood and affect.   Imaging Studies: Ct Abdomen Pelvis W Contrast  Result Date: 04/15/2018 CLINICAL DATA:  Abdominal pain with nausea and diarrhea EXAM: CT ABDOMEN AND PELVIS WITH CONTRAST TECHNIQUE: Multidetector CT imaging of the abdomen and pelvis was performed using the standard protocol following bolus administration of intravenous contrast. CONTRAST:  04/17/2018 ISOVUE-300 IOPAMIDOL (ISOVUE-300) INJECTION 61% COMPARISON:  September 16, 2016 FINDINGS: Lower chest: Lung bases are clear. Hepatobiliary: No focal liver lesions are appreciable. Gallbladder wall is not appreciably thickened. There is no biliary duct dilatation. Pancreas: No pancreatic mass or inflammatory focus. Spleen: No  splenic lesions are evident. Adrenals/Urinary Tract: Adrenals bilaterally appear unremarkable. There is a cyst arising from the lateral upper pole of the left kidney measuring 1.4 x 1.4 cm. There is a cyst in the anterior upper to mid right kidney measuring 1.2 x 1.2 cm. There is a cyst in the lower pole left kidney measuring 0.9 x 0.9 cm. There is no evident hydronephrosis on either side. There is no appreciable renal or ureteral calculus on either side. Urinary bladder is midline with wall thickness within normal limits. Stomach/Bowel: There are sigmoid diverticula without diverticulitis. There is no appreciable bowel wall or mesenteric thickening. No evident bowel obstruction. No free air or portal venous air. Vascular/Lymphatic: There is atherosclerotic plaque and calcification in the aorta and common iliac arteries. Major mesenteric arterial vessels appear patent. No aneurysm evident. There is no appreciable adenopathy in the abdomen or pelvis. Reproductive: Prostate and seminal vesicles appear normal in size and contour. There is no evident pelvic mass. Other: The appendix appears normal. There is no abscess or ascites in the abdomen or pelvis. There is  a minimal ventral hernia containing only fat. Musculoskeletal: Patient is status post total hip replacement on the right. There are no blastic or lytic bone lesions. There is no intramuscular or abdominal wall lesion. IMPRESSION: 1. Scattered sigmoid diverticula without diverticulitis. No bowel obstruction. No abscess in the abdomen or pelvis. Appendix appears normal. 2. No evident renal or ureteral calculus. No hydronephrosis on either side. 3.  Minimal ventral hernia containing only fat. 4.  Aortoiliac atherosclerosis. 5.  Status post total hip replacement on the right. Aortic Atherosclerosis (ICD10-I70.0). Electronically Signed   By: Bretta Bang III M.D.   On: 04/15/2018 07:59    Assessment and Plan:   Jeffrey Grant. is a 37 y.o. y/o male here  to follow-up for abdominal pain, diarrhea, weight loss.  Since the last visit he has not lost any weight.  Colonoscopy showed no abnormalities.  Blood work and stool studies have shown no abnormalities at this point of time.  EGD showed gastroduodenitis.  Plan 1.  Stop smoking  2. Charcoal tablets OTC for bloating  3.LOW FODMAP diet  4. Fiber pills for IBS-D- if no better he will call me and will try a course of Xifaxan.  5. Avoid artificial sugars and sweeteners.   Dr [name]  MD,MRCP [facility]) Follow up in 3-4 months

## 2019-10-19 DIAGNOSIS — R21 Rash and other nonspecific skin eruption: Secondary | ICD-10-CM | POA: Insufficient documentation

## 2019-10-19 DIAGNOSIS — Z79899 Other long term (current) drug therapy: Secondary | ICD-10-CM | POA: Insufficient documentation

## 2019-10-19 DIAGNOSIS — M3500 Sicca syndrome, unspecified: Secondary | ICD-10-CM | POA: Insufficient documentation

## 2021-03-28 DIAGNOSIS — M5412 Radiculopathy, cervical region: Secondary | ICD-10-CM | POA: Insufficient documentation

## 2021-03-28 DIAGNOSIS — M545 Low back pain, unspecified: Secondary | ICD-10-CM | POA: Insufficient documentation

## 2021-03-28 DIAGNOSIS — R52 Pain, unspecified: Secondary | ICD-10-CM | POA: Insufficient documentation

## 2021-03-28 DIAGNOSIS — M069 Rheumatoid arthritis, unspecified: Secondary | ICD-10-CM | POA: Insufficient documentation

## 2021-04-09 ENCOUNTER — Emergency Department: Payer: Medicaid Other

## 2021-04-09 ENCOUNTER — Emergency Department
Admission: EM | Admit: 2021-04-09 | Discharge: 2021-04-09 | Disposition: A | Discharge status: Home / Self Care | Payer: Medicaid Other | Attending: Student in an Organized Health Care Education/Training Program | Admitting: Student in an Organized Health Care Education/Training Program

## 2021-04-09 ENCOUNTER — Other Ambulatory Visit: Payer: Self-pay

## 2021-04-09 DIAGNOSIS — Z96651 Presence of right artificial knee joint: Secondary | ICD-10-CM | POA: Insufficient documentation

## 2021-04-09 DIAGNOSIS — Z79899 Other long term (current) drug therapy: Secondary | ICD-10-CM | POA: Diagnosis not present

## 2021-04-09 DIAGNOSIS — M79605 Pain in left leg: Secondary | ICD-10-CM | POA: Diagnosis present

## 2021-04-09 DIAGNOSIS — F17210 Nicotine dependence, cigarettes, uncomplicated: Secondary | ICD-10-CM | POA: Diagnosis not present

## 2021-04-09 DIAGNOSIS — I10 Essential (primary) hypertension: Secondary | ICD-10-CM | POA: Diagnosis not present

## 2021-04-09 DIAGNOSIS — R202 Paresthesia of skin: Secondary | ICD-10-CM | POA: Insufficient documentation

## 2021-04-09 LAB — URINALYSIS, COMPLETE (UACMP) WITH MICROSCOPIC
Bacteria, UA: NONE SEEN
Bilirubin Urine: NEGATIVE
Glucose, UA: NEGATIVE mg/dL
Ketones, ur: NEGATIVE mg/dL
Leukocytes,Ua: NEGATIVE
Nitrite: NEGATIVE
Protein, ur: NEGATIVE mg/dL
Specific Gravity, Urine: 1.018 (ref 1.005–1.030)
pH: 5 (ref 5.0–8.0)

## 2021-04-09 LAB — BASIC METABOLIC PANEL
Anion gap: 9 (ref 5–15)
BUN: 25 mg/dL — ABNORMAL HIGH (ref 6–20)
CO2: 28 mmol/L (ref 22–32)
Calcium: 9 mg/dL (ref 8.9–10.3)
Chloride: 98 mmol/L (ref 98–111)
Creatinine, Ser: 0.85 mg/dL (ref 0.61–1.24)
GFR, Estimated: >60 mL/min (ref >60)
Glucose, Bld: 101 mg/dL — ABNORMAL HIGH (ref 70–99)
Potassium: 4.3 mmol/L (ref 3.5–5.1)
Sodium: 135 mmol/L (ref 135–145)

## 2021-04-09 LAB — CBC
HCT: 41.8 % (ref 39.0–52.0)
Hemoglobin: 13.9 g/dL (ref 13.0–17.0)
MCH: 29.6 pg (ref 26.0–34.0)
MCHC: 33.3 g/dL (ref 30.0–36.0)
MCV: 88.9 fL (ref 80.0–100.0)
Platelets: 195 10*3/uL (ref 150–400)
RBC: 4.7 MIL/uL (ref 4.22–5.81)
RDW: 15.5 % (ref 11.5–15.5)
WBC: 13.2 10*3/uL — ABNORMAL HIGH (ref 4.0–10.5)
nRBC: 0 % (ref 0.0–0.2)

## 2021-04-09 IMAGING — MR MR HEAD W/O CM
12 series · 48 of 48 positions shown · non-contrast
Comparison: None.

CLINICAL DATA: Several weeks of progressively worsening numbness
and tingling in left leg

EXAM:
MRI HEAD WITHOUT CONTRAST
TECHNIQUE: Multiplanar, multiecho pulse sequences of the brain and surrounding
structures were obtained without intravenous contrast.

[Series 5: ax dwi_tracew · axial · 3.0mm · 0.65mm/px · z∈[-53,+101]mm · 4 of 48 slices shown]
[im 1/48]
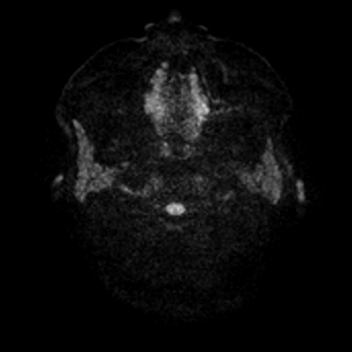
[im 16/48]
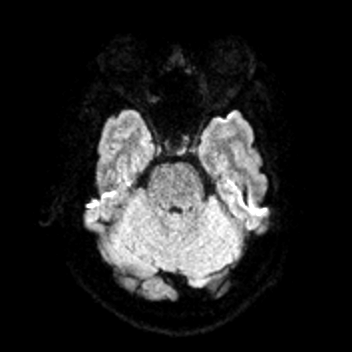
[im 32/48]
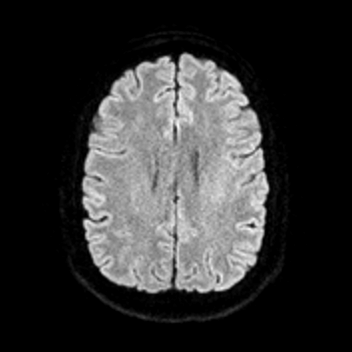
[im 48/48]
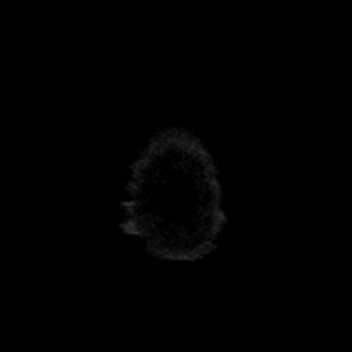

[Series 6: ax dwi_adc · axial · 3.0mm · 0.65mm/px · z∈[-53,+101]mm · 4 of 48 slices shown]
[im 1/48]
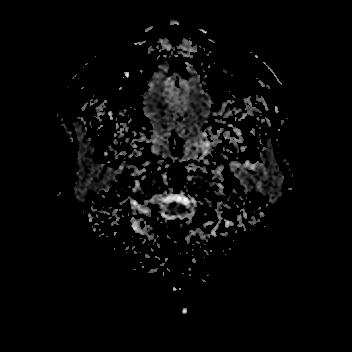
[im 16/48]
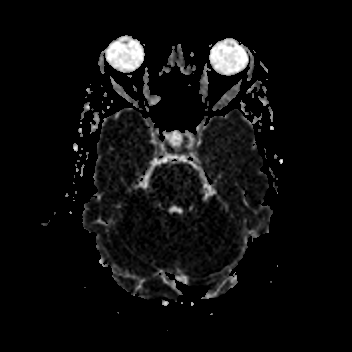
[im 32/48]
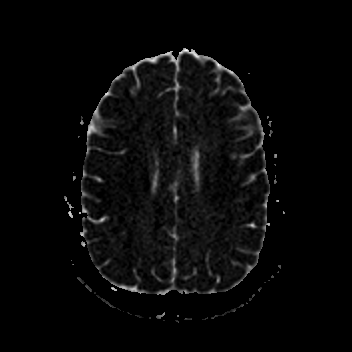
[im 48/48]
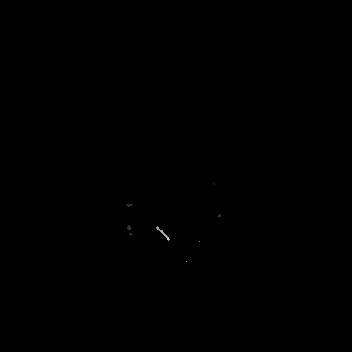

[Series 7: T1 · sagittal · 5.0mm · 0.62mm/px · 2 of 23 slices shown (1 of 2)]
[im 1/23]
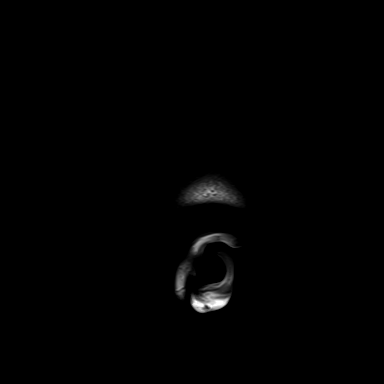
[im 23/23]
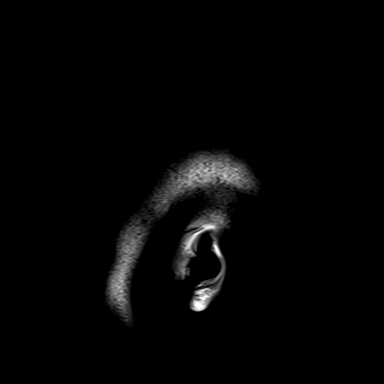

[Series 8: cor dwi_tracew · coronal · 5.0mm · 0.68mm/px · 3 of 40 slices shown]
[im 1/40]
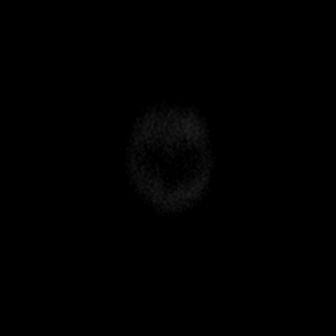
[im 20/40]
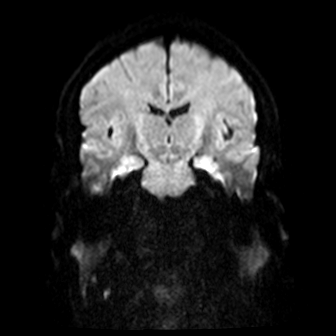
[im 40/40]
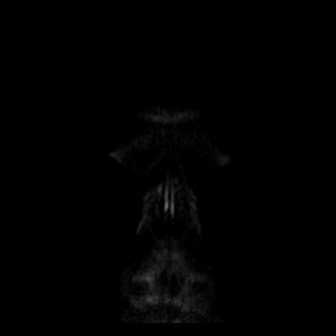

[Series 9: cor dwi_adc · coronal · 5.0mm · 0.68mm/px · 3 of 40 slices shown]
[im 1/40]
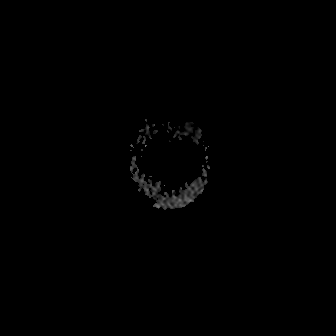
[im 20/40]
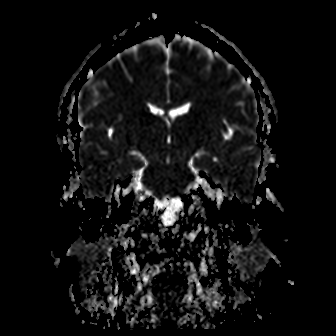
[im 40/40]
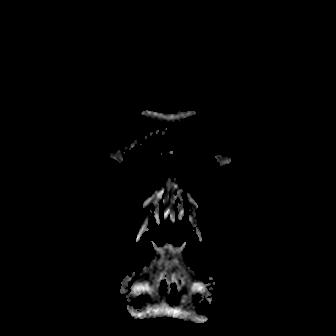

[Series 10: T2 · axial · 5.0mm · 0.53mm/px · z∈[-55,+100]mm · 2 of 27 slices shown (1 of 2)]
[im 1/27]
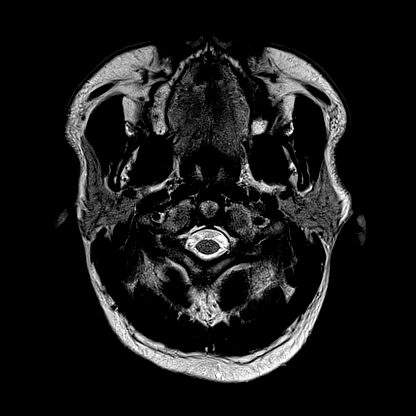
[im 27/27]
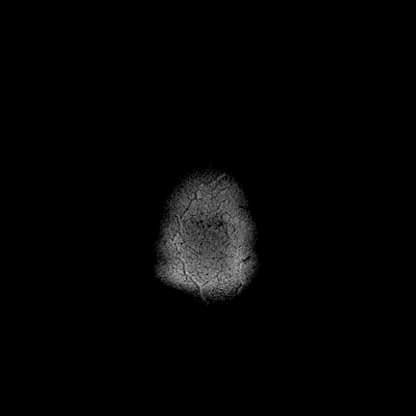

[Series 11: mag_images · axial · 3.0mm · 0.90mm/px · z∈[-66,+110]mm · 4 of 60 slices shown]
[im 1/60]
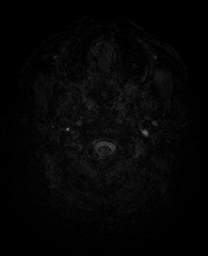
[im 20/60]
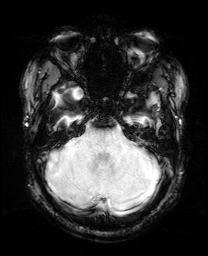
[im 40/60]
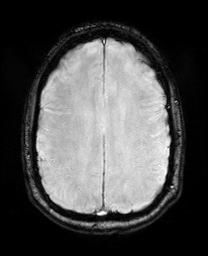
[im 60/60]
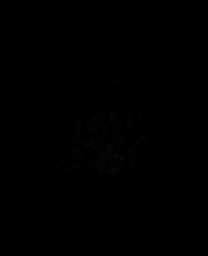

[Series 12: pha_images · axial · 3.0mm · 0.90mm/px · z∈[-66,+110]mm · 4 of 60 slices shown]
[im 1/60]
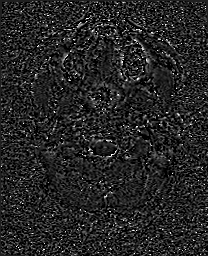
[im 20/60]
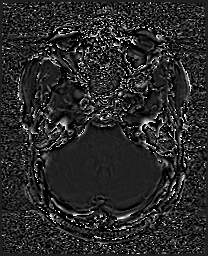
[im 40/60]
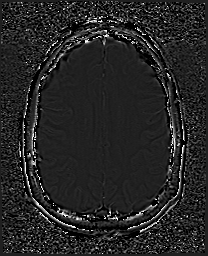
[im 60/60]
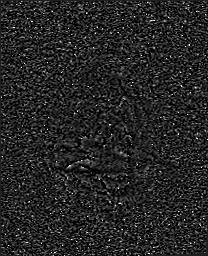

[Series 13: swi_images · axial · 3.0mm · 0.90mm/px · z∈[-66,+110]mm · 4 of 60 slices shown]
[im 1/60]
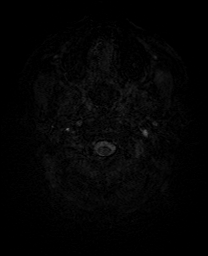
[im 20/60]
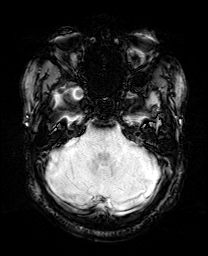
[im 40/60]
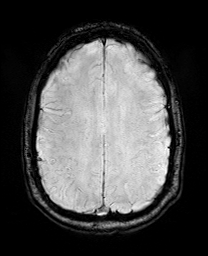
[im 60/60]
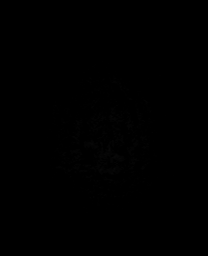

[Series 15: FLAIR · axial · 3.0mm · 0.53mm/px · z∈[-59,+102]mm · 4 of 55 slices shown]
[im 1/55]
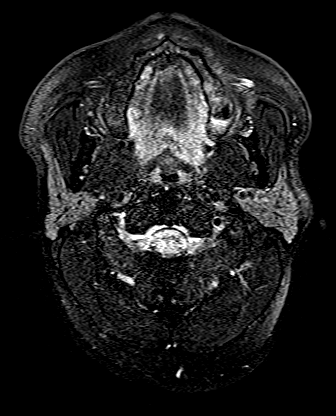
[im 19/55]
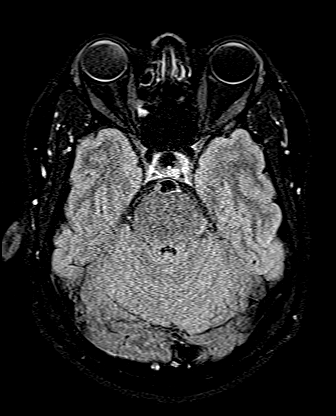
[im 37/55]
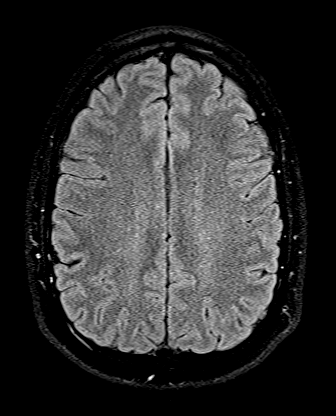
[im 55/55]
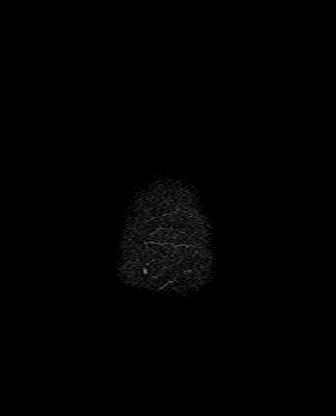

[Series 16: T1 · axial · 1.0mm · 0.98mm/px · z∈[-59,+113]mm · 12 of 173 slices shown (2 of 2)]
[im 1/173]
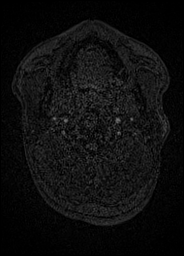
[im 16/173]
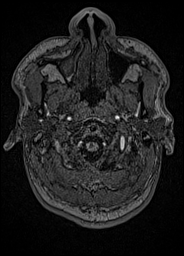
[im 32/173]
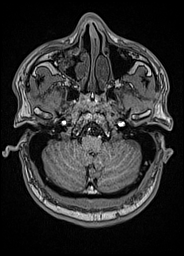
[im 47/173]
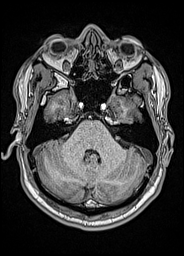
[im 63/173]
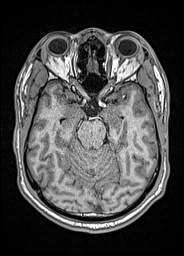
[im 79/173]
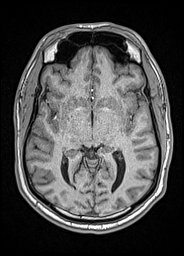
[im 94/173]
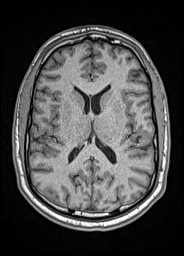
[im 110/173]
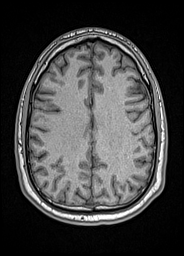
[im 126/173]
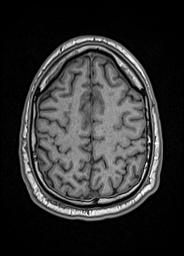
[im 141/173]
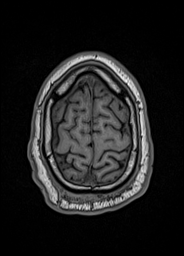
[im 157/173]
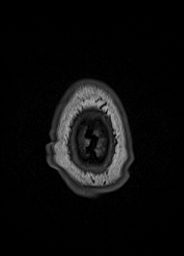
[im 173/173]
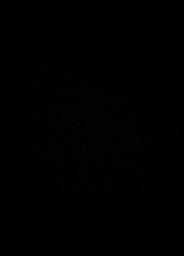

[Series 17: T2 · coronal · 5.0mm · 0.57mm/px · 2 of 32 slices shown (2 of 2)]
[im 1/32]
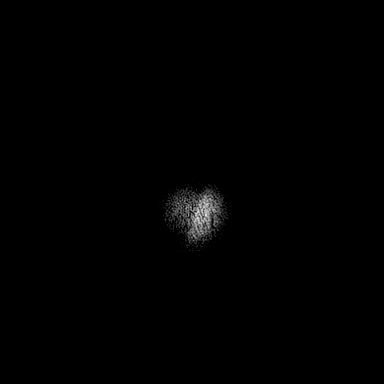
[im 32/32]
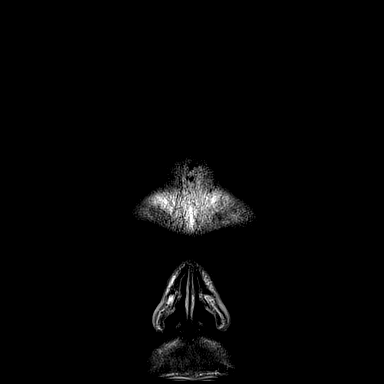

[48 of 48 positions shown; findings below may reference images not displayed]

FINDINGS: Brain: There is no evidence of acute intracranial hemorrhage,
extra-axial fluid collection, or acute infarct.

Parenchymal volume is normal. The ventricles are normal in size.
There is no parenchymal signal abnormality.

There is no mass lesion.  There is no midline shift.

Vascular: Normal flow voids.

Skull and upper cervical spine: Normal marrow signal.

Sinuses/Orbits: There is mucosal thickening in the atelectatic right
maxillary sinus. The globes and orbits are unremarkable.

Other: None.
IMPRESSION: 1. No acute intracranial pathology to explain the patient's
symptoms.
2. Mucosal thickening in the atelectatic right maxillary sinus may
reflect chronic sinusitis.

## 2021-04-09 IMAGING — MR MR LUMBAR SPINE W/O CM
5 series · 31 of 48 positions shown · non-contrast
Comparison: Abdominopelvic CT [DATE].

CLINICAL DATA: Low back pain, progressive neurologic deficit.
Several week history of progressively worsening numbness and
tingling in his left leg associated with pain and cramping in the
low back. History of collagen vascular disease. No acute injury or
prior relevant surgery.

EXAM:
MRI LUMBAR SPINE WITHOUT CONTRAST
TECHNIQUE: Multiplanar, multisequence MR imaging of the lumbar spine was
performed. No intravenous contrast was administered.

[Series 5: T2 · sagittal · 4.0mm · 0.81mm/px · 7 of 17 slices shown (1 of 2)]
[im 1/17]
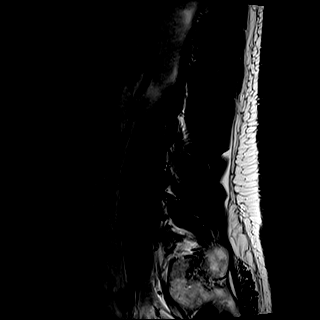
[im 3/17]
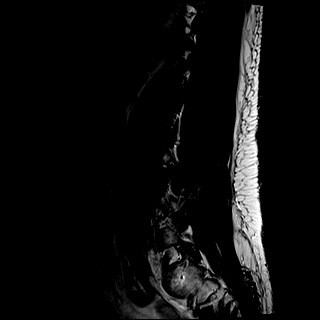
[im 6/17]
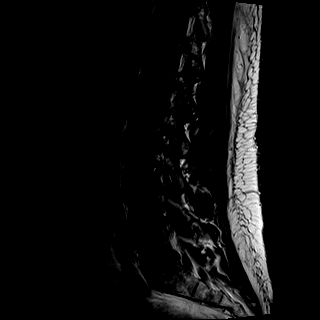
[im 9/17]
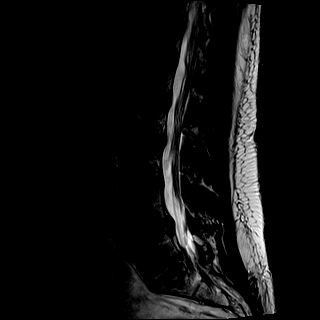
[im 11/17]
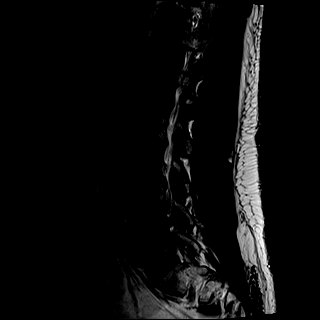
[im 14/17]
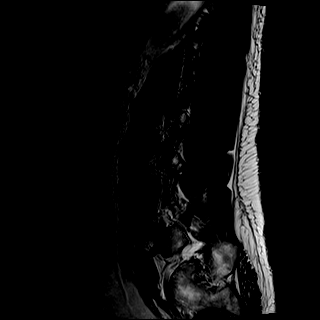
[im 17/17]
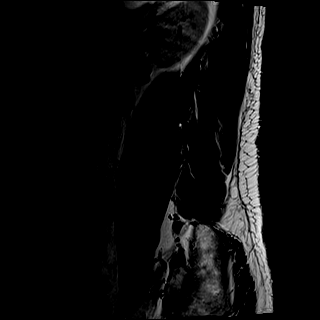

[Series 6: T1 · sagittal · 4.0mm · 0.81mm/px · 7 of 17 slices shown (1 of 2)]
[im 1/17]
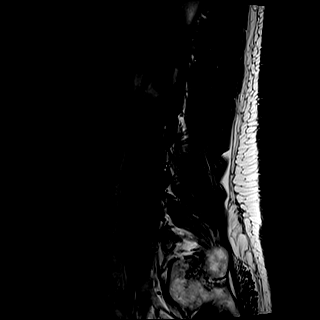
[im 3/17]
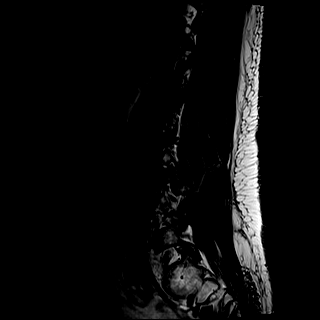
[im 6/17]
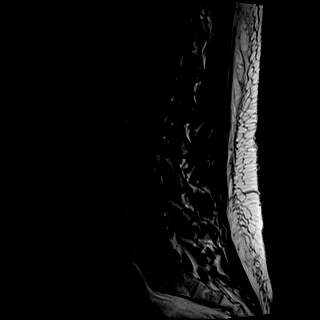
[im 9/17]
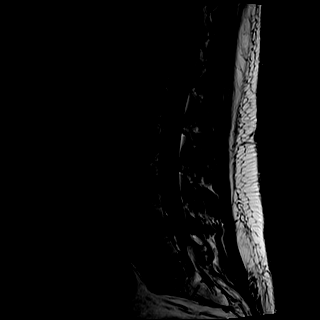
[im 11/17]
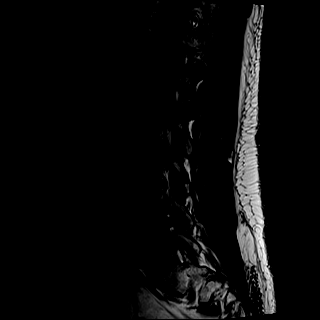
[im 14/17]
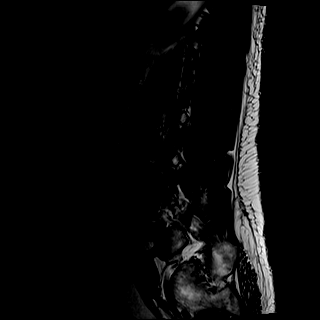
[im 17/17]
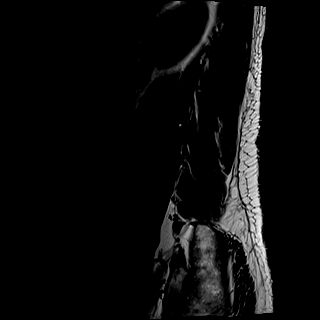

[Series 7: STIR · sagittal · 4.0mm · 0.41mm/px · 1 of 17 slices shown]
[im 1/17]
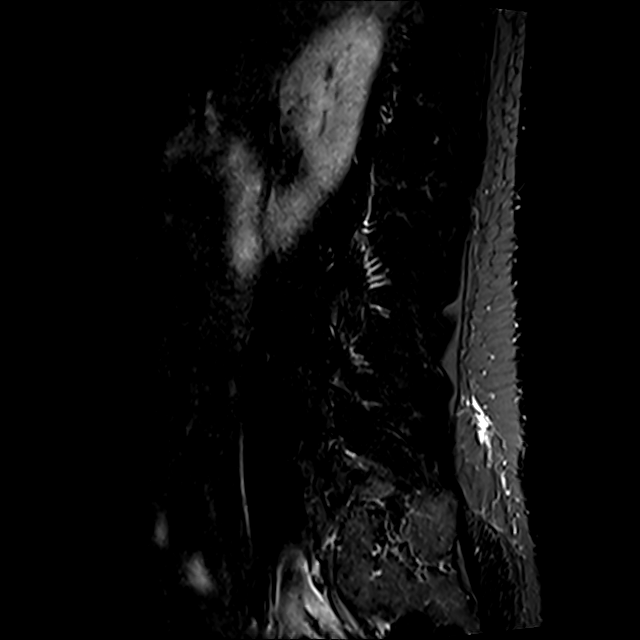

[Series 8: T2 · axial · 4.0mm · 0.78mm/px · z∈[-139,+83]mm · 8 of 38 slices shown (2 of 2)]
[im 1/38]
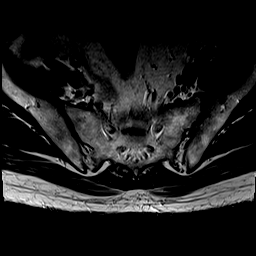
[im 6/38]
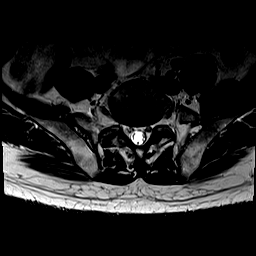
[im 12/38]
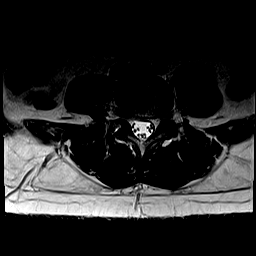
[im 18/38]
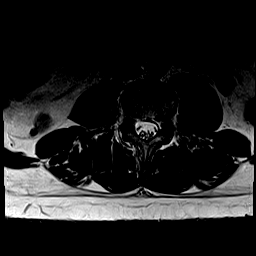
[im 20/38]
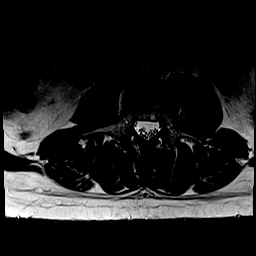
[im 26/38]
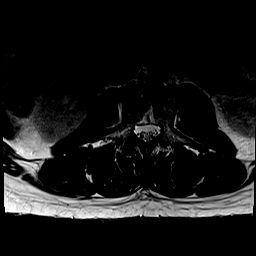
[im 32/38]
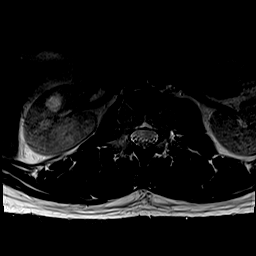
[im 38/38]
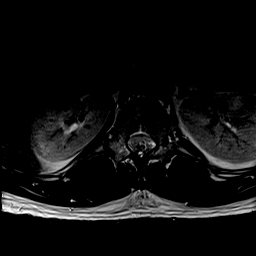

[Series 9: T1 · axial · 4.0mm · 0.39mm/px · z∈[-139,+83]mm · 8 of 38 slices shown (2 of 2)]
[im 1/38]
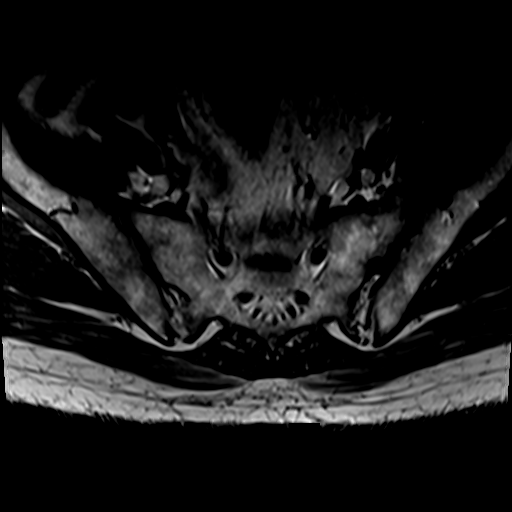
[im 6/38]
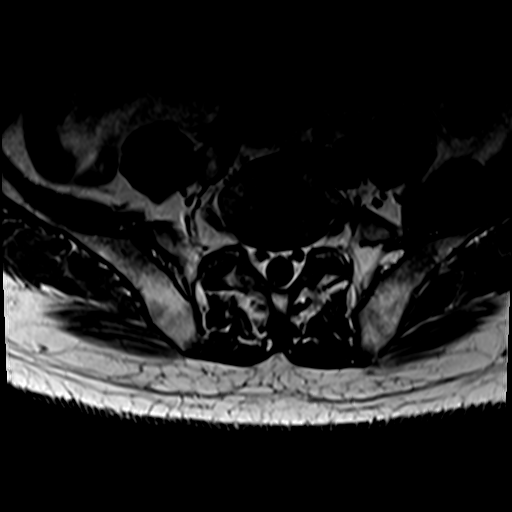
[im 12/38]
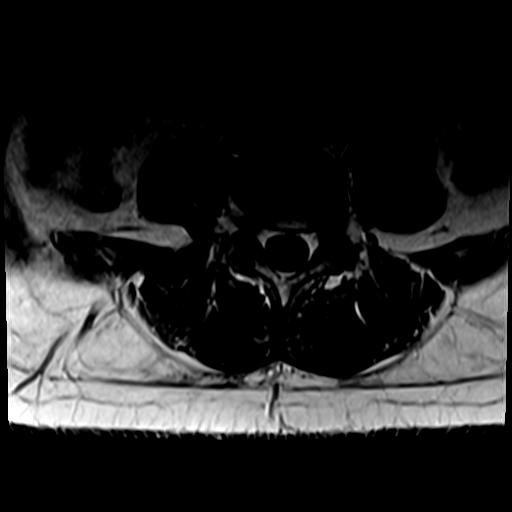
[im 18/38]
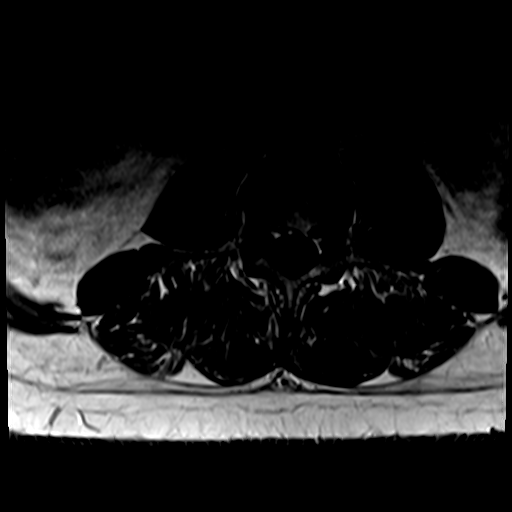
[im 20/38]
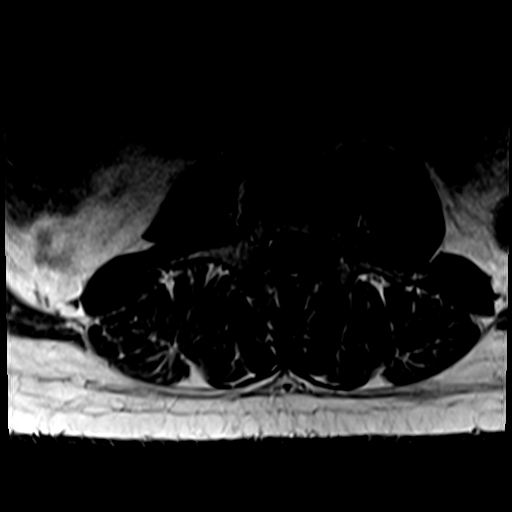
[im 26/38]
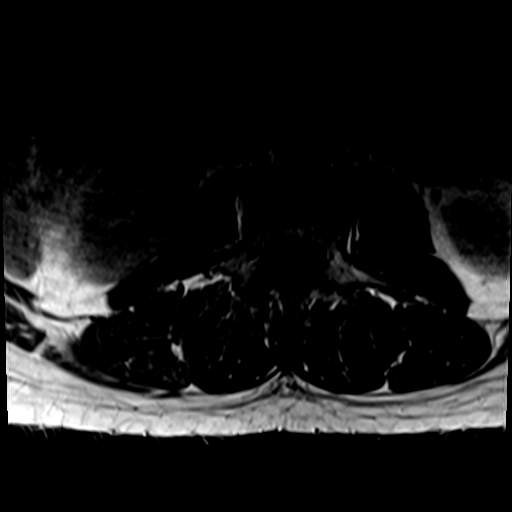
[im 32/38]
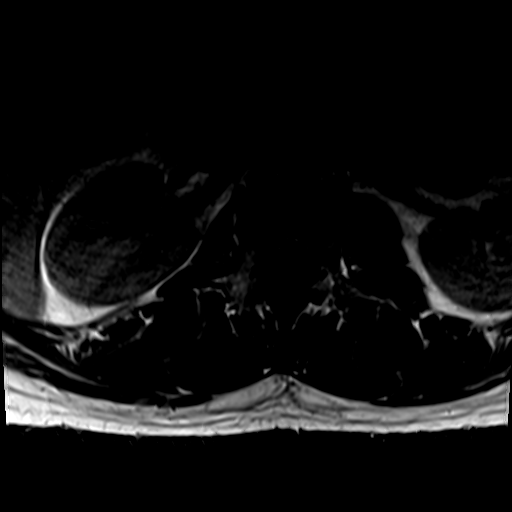
[im 38/38]
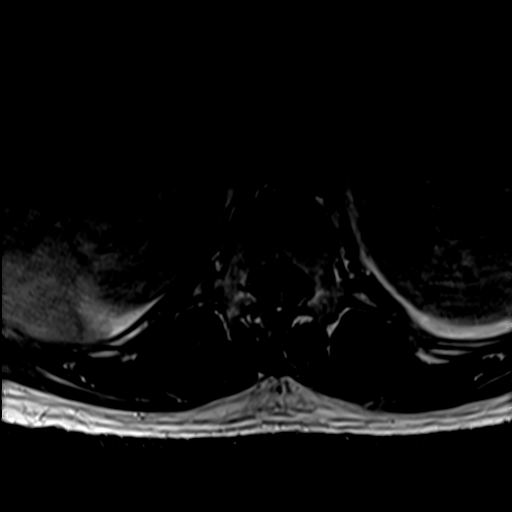

[31 of 48 positions shown; findings below may reference images not displayed]

FINDINGS: Segmentation: Conventional anatomy assumed, with the last open disc
space designated L5-S1.Concordant with previous imaging.

Alignment:  Physiologic.

Vertebrae: No worrisome osseous lesion, acute fracture or pars
defect. The visualized sacroiliac joints appear unremarkable.

Conus medullaris: Extends to the L1 level and appears normal.

Paraspinal and other soft tissues: No significant paraspinal
findings. Small renal cysts are again noted bilaterally.

Disc levels:

No significant disc space findings at T11-12 or T12-L1.

L1-2: Small right paracentral disc protrusion. No resulting spinal
stenosis or nerve root encroachment.

L2-3: Mild disc bulging with small disc protrusion in the right
subarticular zone. There is resulting mild narrowing of the right
lateral recess and possible right L3 nerve root encroachment. There
is no significant foraminal narrowing or left-sided nerve root
encroachment.

L3-4: Disc height and hydration are maintained. Mild bilateral facet
hypertrophy. No spinal stenosis or nerve root encroachment.

L4-5: Loss of disc height with annular disc bulging, a broad-based
central disc protrusion and a small disc extrusion in the left
subarticular zone. This is best seen on the sagittal images,
demonstrating caudal down turning behind the L5 vertebral body and
potentially encroaching on the left L5 nerve root in the lateral
recess. There is mild facet and ligamentous hypertrophy. Both
foramina are mildly narrowed without definite exiting L4 nerve root
encroachment.

L5-S1: There is a left-sided lumbosacral assimilation joint at this
level. Disc height and hydration are maintained. Mild bilateral
facet hypertrophy. No spinal stenosis or nerve root encroachment.
IMPRESSION: 1. Caudal migration of a small disc fragment at L4-5 into the left
lateral recess with possible resulting left L5 nerve root
encroachment. This could contribute to the patient's symptoms.
2. No other evidence of left-sided nerve root encroachment. A small
disc protrusion within the right subarticular zone at L2-3 could
encroach on the right L3 nerve root. There is a small right
paracentral disc protrusion at L1-2, without nerve root
encroachment.
3. No acute osseous findings or malalignment.

## 2021-04-09 MED ORDER — OXYCODONE-ACETAMINOPHEN 5-325 MG PO TABS: 1.0000 | ORAL_TABLET | ORAL | 0 refills | Status: DC | PRN

## 2021-04-09 MED ORDER — OXYCODONE-ACETAMINOPHEN 5-325 MG PO TABS
1.0000 | ORAL_TABLET | Freq: Once | ORAL | Status: AC
  Administered 2021-04-09: 1 via ORAL
  Filled 2021-04-09: qty 1

## 2021-04-09 NOTE — ED Triage Notes (Signed)
Pt comes with c/o head numbness, leg numbness and foot. Pt states this has been going on for weeks. Pt states dizziness. Pt states pain in legs.  Pt states he is scheduled for a MRI.

## 2021-04-09 NOTE — ED Provider Notes (Signed)
-----------------------------------------   5:34 PM on 04/09/2021 ----------------------------------------- MRI of the brain is negative.  Patient does have possible nerve root impingement of the left side in the lumbar spine which could be the cause of some of the patient's left leg discomfort.  Patient states he just completed a course of steroids.  His lab work is reassuring slight leukocytosis which could be contributed to prednisone use.  Normal anion gap otherwise normal chemistry.  Patient appears well.  We will refer to Dr. Rosita Kea for further evaluation.  As the patient just completed a course of prednisone we will hold off on further antibiotics.  We will prescribe a short course of pain medication for the patient.   Minna Antis, MD 04/09/21 1735

## 2021-04-09 NOTE — ED Notes (Signed)
Pt at MRI

## 2021-04-09 NOTE — ED Provider Notes (Addendum)
[facility] Emergency Department Provider Note    Event Date/Time   First MD Initiated Contact with Patient 04/09/21 1255     (approximate)  I have reviewed the triage vital signs and the nursing notes.   HISTORY  Chief Complaint Numbness    HPI Jeffrey Matsuo. is a 40 y.o. male with the below listed past medical history presents to the ER for evaluation of several weeks of progressively worsening numbness and tingling in his left leg associated with pain and cramping at night as well as some low back pain.  Scan to the ER because 2 days ago he started having tingling around the crown of his head primarily on the right side.  Denies any blurry vision.  No numbness or tingling of upper extremity.  Denies any nausea or vomiting no chest pain or shortness of breath.  He does smoke.  Has a history of collagen vascular disease reportedly has a history of rheumatoid arthritis though is not convinced that that is accurate diagnosis.  Past Medical History:  Diagnosis Date   Anxiety    Atherosclerotic PVD with intermittent claudication (HCC)    Back pain    Collagen vascular disease (HCC)    Dyspnea    Dysrhythmia    Elevated lipids    Hypertension    Nausea    Seropositive rheumatoid arthritis (HCC)    No family history on file. 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   Procedure: TOTAL HIP ARTHROPLASTY ANTERIOR APPROACH;  Surgeon: Kennedy Bucker, MD;  Location: [facility] ORS;  Service:  Orthopedics;  Laterality: Right;   Patient Active Problem List   Diagnosis Date Noted   Depression 04/07/2018   Enlarged prostate 04/07/2018   Schizophrenia (HCC) 04/07/2018   Simple renal cyst 04/07/2018   Sleep apnea 04/07/2018   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 Authorizing Provider  meloxicam (MOBIC) 15 MG tablet Take 1 tablet by mouth daily.   Yes [provider]  NEURONTIN 300 MG capsule Take 300 mg by mouth at bedtime. 04/05/21  Yes [provider]  amLODipine (NORVASC) 10 MG tablet Take 10 mg by mouth every evening. Patient not taking: Reported on 04/09/2021 11/06/09   [provider]  atorvastatin (LIPITOR) 40 MG tablet Take 40 mg by mouth every evening. Patient not taking: Reported on 04/09/2021    [provider]  buPROPion [facility] SR) 150 MG 12 hr tablet  03/23/18   [provider]  carvedilol (COREG) 12.5 MG tablet Take 12.5 mg by mouth 2 (two) times daily with a meal. Patient not  taking: Reported on 04/09/2021    [provider]  diazepam (VALIUM) 5 MG tablet  03/23/18   [provider]  docusate sodium (COLACE) 100 MG capsule Take 1 tablet once or twice daily as needed for constipation while taking narcotic pain medicine Patient not taking: Reported on 04/09/2021 09/16/16   [name], MD  enoxaparin (LOVENOX) 40 MG/0.4ML injection Inject 0.4 mLs (40 mg total) into the skin  daily. 10/25/16 11/08/16  [name], PA-C  folic acid (FOLVITE) 1 MG tablet Take 1 mg by mouth every evening. Patient not taking: Reported on 04/09/2021 09/09/16   [provider]  hydrochlorothiazide (HYDRODIURIL) 25 MG tablet Take 25 mg by mouth daily.  Patient not taking: Reported on 04/09/2021 09/03/10   [provider]  lisinopril (PRINIVIL,ZESTRIL) 40 MG tablet Take 40 mg by mouth daily.  Patient not taking: Reported on 04/09/2021 09/03/10   [provider]  meclizine (ANTIVERT) 25 MG tablet Take 25 mg by mouth 3 (three) times daily as needed for nausea. Patient not taking: Reported on 04/09/2021    [provider]  methocarbamol (ROBAXIN) 500 MG tablet Take 1 tablet (500 mg total) by mouth every 6 (six) hours as needed for muscle spasms. Patient not taking: Reported on 04/09/2021 10/24/16   [name], PA-C  METHOTREXATE SODIUM IJ Inject 25 mg into the skin every Wednesday. Patient not taking: Reported on 04/09/2021 09/12/16   [provider]  metoprolol succinate (TOPROL-XL) 100 MG 24 hr tablet Take 100 mg by mouth every evening. Patient not taking: Reported on 04/09/2021 09/03/10   [provider]  naproxen (NAPROSYN) 500 MG tablet Take 1 tablet (500 mg total) by mouth 2 (two) times daily with a meal. Patient not taking: Reported on 05/13/2018 09/07/16   [name], MD  oxyCODONE (OXY IR/ROXICODONE) 5 MG immediate release tablet Take 1-2 tablets (5-10 mg total) by mouth every 4 (four) hours as needed for breakthrough pain. Patient not taking: No sig reported 10/24/16   [name], PA-C  QUEtiapine (SEROQUEL) 100 MG tablet  03/23/18   [provider]  traZODone (DESYREL) 100 MG tablet Take 100 mg by mouth at bedtime as needed for sleep. Patient not taking: Reported on 04/09/2021 07/07/12   [provider]  Vitamin D, Ergocalciferol, (DRISDOL) 50000 units CAPS capsule Take 50,000 Units by mouth every  Wednesday. Patient not taking: Reported on 04/09/2021    [provider]    Allergies Patient has no known allergies.    Social History Social History   Tobacco Use   Smoking status: Every Day    Packs/day: 0.50    Years: 16.00    Pack years: 8.00    Types: Cigarettes   Smokeless tobacco: Never  Substance Use Topics   Alcohol use: No   Drug use: Yes    Types: Marijuana    Review of Systems Patient denies headaches, rhinorrhea, blurry vision, numbness, shortness of breath, chest pain, edema, cough, abdominal pain, nausea, vomiting, diarrhea, dysuria, fevers, rashes or hallucinations unless otherwise stated above in HPI. ____________________________________________   PHYSICAL EXAM:  VITAL SIGNS: Vitals:   04/09/21 1129 04/09/21 1403  BP: (!) 207/112 (!) 180/102  Pulse: 89 73  Resp: 18 16  Temp:    SpO2: 100% 100%    Constitutional: Alert and oriented.  Eyes: Conjunctivae are normal.  Head: Atraumatic. Nose: No congestion/rhinnorhea. Mouth/Throat: Mucous membranes are moist.   Neck: No stridor. Painless ROM.  Cardiovascular: Normal rate, regular rhythm.  Grossly normal heart sounds.  Good peripheral circulation. Respiratory: Normal respiratory effort.  No retractions. Lungs CTAB. Gastrointestinal: Soft and nontender. No distention. No abdominal bruits. No CVA tenderness. Genitourinary:  Musculoskeletal: Bilateral lower extremities are cool but brisk cap refill.  Unable to Doppler bilateral DP signals but bilateral PT signals are present.  No overlying erythema or warmth.  DTRs are intact good strength.  Describes some paresthesias of the left leg particular on the foot. Neurologic:  Normal speech and language. No gross focal neurologic deficits are appreciated. No facial droop Skin:  Skin is warm, dry and intact. No rash noted. Psychiatric: Mood and affect are normal. Speech and behavior are normal.  ____________________________________________    LABS (all labs ordered are listed, but only abnormal results are displayed)  Results for orders placed or performed during the hospital encounter of 04/09/21 (from the past 24 hour(s))  Basic metabolic panel     Status: Abnormal   Collection Time: 04/09/21  7:33 AM  Result Value Ref Range   Sodium 135 135 - 145 mmol/L   Potassium 4.3 3.5 - 5.1 mmol/L   Chloride 98 98 - 111 mmol/L   CO2 28 22 - 32 mmol/L   Glucose, Bld 101 (H) 70 - 99 mg/dL   BUN 25 (H) 6 - 20 mg/dL   Creatinine, Ser 0.93 0.61 - 1.24 mg/dL   Calcium 9.0 8.9 - 26.7 mg/dL   GFR, Estimated >12 >45 mL/min   Anion gap 9 5 - 15  CBC     Status: Abnormal   Collection Time: 04/09/21  7:33 AM  Result Value Ref Range   WBC 13.2 (H) 4.0 - 10.5 K/uL   RBC 4.70 4.22 - 5.81 MIL/uL   Hemoglobin 13.9 13.0 - 17.0 g/dL   HCT 80.9 98.3 - 38.2 %   MCV 88.9 80.0 - 100.0 fL   MCH 29.6 26.0 - 34.0 pg   MCHC 33.3 30.0 - 36.0 g/dL   RDW 50.5 39.7 - 67.3 %   Platelets 195 150 - 400 K/uL   nRBC 0.0 0.0 - 0.2 %  Urinalysis, Complete w Microscopic     Status: Abnormal   Collection Time: 04/09/21  7:34 AM  Result Value Ref Range   Color, Urine YELLOW (A) YELLOW   APPearance HAZY (A) CLEAR   Specific Gravity, Urine 1.018 1.005 - 1.030   pH 5.0 5.0 - 8.0   Glucose, UA NEGATIVE NEGATIVE mg/dL   Hgb urine dipstick SMALL (A) NEGATIVE   Bilirubin Urine NEGATIVE NEGATIVE   Ketones, ur NEGATIVE NEGATIVE mg/dL   Protein, ur NEGATIVE NEGATIVE mg/dL   Nitrite NEGATIVE NEGATIVE   Leukocytes,Ua NEGATIVE NEGATIVE   RBC / HPF 0-5 0 - 5 RBC/hpf   WBC, UA 6-10 0 - 5 WBC/hpf   Bacteria, UA NONE SEEN NONE SEEN   Squamous Epithelial / LPF 0-5 0 - 5   Mucus PRESENT    ____________________________________________  EKG My review and personal interpretation at Time: 7:28   Indication: numbness  Rate: 80  Rhythm: sinus Axis: normal Other: normal intervals, no stemi ____________________________________________  RADIOLOGY  I personally reviewed  all radiographic images ordered to evaluate for the above acute complaints and reviewed radiology reports and findings.  These findings were personally discussed with the patient.  Please see medical record for radiology report.  ____________________________________________   PROCEDURES  Procedure(s) performed:  Procedures    Critical Care performed: no ____________________________________________   INITIAL IMPRESSION / ASSESSMENT AND PLAN / ED COURSE  Pertinent labs & imaging results that were available during my care of the patient were reviewed by me and considered in my medical decision making (see chart for details).   DDX: Radiculopathy, spinal stenosis, cauda equina, CVA, mass, PAD  Jeffrey Stuhr. is a 40 y.o. who presents to the ED with extensive past medical history for his age presenting to the ER with close neuro findings which have been subacute but new tingling, etc. has had more acute.  Well outside the window for stroke but based on his age and risk factors I do believe that MRI indicated to evaluate for the above differential.  Will order MRI if this is negative I think he is appropriate for continued outpatient follow-up.  Do suspect he probably has some PVD given his history of collagen vascular disease as well as smoking history but he has dopplerable pulses and the leg that he is having discomfort has the same vascular exam is the right therefore of a lower suspicion for acute ischemia or thrombus.  Does not seem consistent with dissection.  No sign of overlying cellulitis.   Patient will be signed out to oncoming provider pending follow up mri.    The patient was evaluated in Emergency Department today for the symptoms described in the history of present illness. He/she was evaluated in the context of the global COVID-19 pandemic, which necessitated consideration that the patient might be at risk for infection with the SARS-CoV-2 virus that causes COVID-19.  Institutional protocols and algorithms that pertain to the evaluation of patients at risk for COVID-19 are in a state of rapid change based on information released by regulatory bodies including the CDC and federal and state organizations. These policies and algorithms were followed during the patient's care in the ED.  As part of my medical decision making, I reviewed the following data within the electronic MEDICAL RECORD NUMBER Nursing notes reviewed and incorporated, Labs reviewed, notes from prior ED visits and [place] Controlled Substance Database   ____________________________________________   FINAL CLINICAL IMPRESSION(S) / ED DIAGNOSES  Final diagnoses:  Left leg pain  Paresthesia      NEW MEDICATIONS STARTED DURING THIS VISIT:  New Prescriptions   No medications on file     Note:  This document was prepared using Dragon voice recognition software and may include unintentional dictation errors.      [name], MD 04/09/21 [phone]

## 2021-04-09 NOTE — Discharge Instructions (Signed)
Please call the number provided for orthopedics to arrange a follow-up appointment for further evaluation.  Please take your pain medication as needed but only as prescribed.  Return to the emergency department for any worsening weakness/numbness of the left leg, or any episodes of incontinence (loss of control of your bladder or bowels).

## 2021-04-12 ENCOUNTER — Other Ambulatory Visit: Payer: Self-pay | Admitting: Family Medicine

## 2021-04-12 DIAGNOSIS — M79672 Pain in left foot: Secondary | ICD-10-CM

## 2021-04-23 ENCOUNTER — Other Ambulatory Visit (INDEPENDENT_AMBULATORY_CARE_PROVIDER_SITE_OTHER): Payer: Self-pay | Admitting: Internal Medicine

## 2021-04-23 DIAGNOSIS — R600 Localized edema: Secondary | ICD-10-CM

## 2021-04-23 DIAGNOSIS — I70219 Atherosclerosis of native arteries of extremities with intermittent claudication, unspecified extremity: Secondary | ICD-10-CM

## 2021-04-23 DIAGNOSIS — M79605 Pain in left leg: Secondary | ICD-10-CM

## 2021-04-24 ENCOUNTER — Other Ambulatory Visit: Payer: Self-pay

## 2021-04-24 ENCOUNTER — Ambulatory Visit
Admission: RE | Admit: 2021-04-24 | Discharge: 2021-04-24 | Disposition: A | Discharge status: Home / Self Care | Payer: Medicaid Other | Source: Ambulatory Visit | Attending: Family Medicine | Admitting: Family Medicine

## 2021-04-24 DIAGNOSIS — M79672 Pain in left foot: Secondary | ICD-10-CM | POA: Insufficient documentation

## 2021-04-24 IMAGING — MR MR FOOT*L* W/O CM
6 series · 40 of 40 positions shown · non-contrast
Comparison: None.

CLINICAL DATA: Ongoing left foot pain. History of rheumatoid
arthritis

EXAM:
MRI OF THE LEFT FOOT WITHOUT CONTRAST
TECHNIQUE: Multiplanar, multisequence MR imaging of the left forefoot was
performed. No intravenous contrast was administered.

[Series 4: T1 · coronal · 3.0mm · 0.53mm/px · 8 of 40 slices shown (1 of 2)]
[im 1/40]
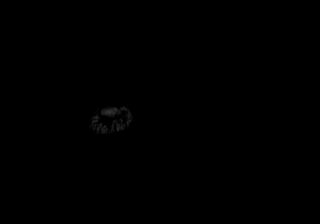
[im 6/40]
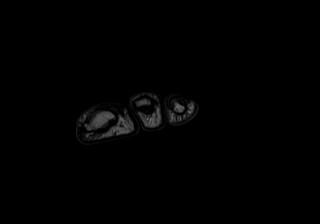
[im 12/40]
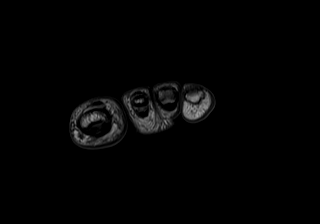
[im 17/40]
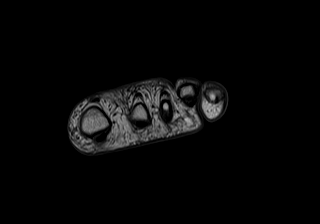
[im 23/40]
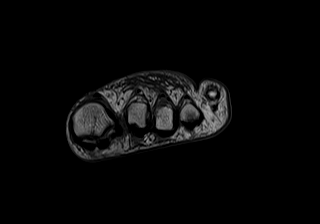
[im 28/40]
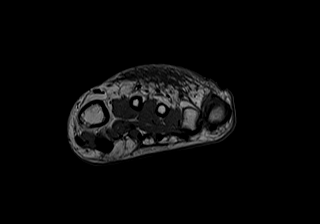
[im 34/40]
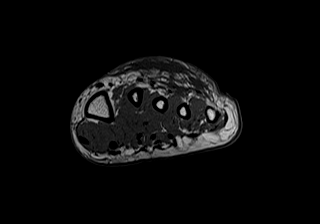
[im 40/40]
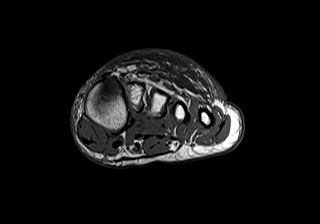

[Series 5: T2 fat-sat · coronal · 3.0mm · 0.53mm/px · 8 of 40 slices shown (1 of 2)]
[im 1/40]
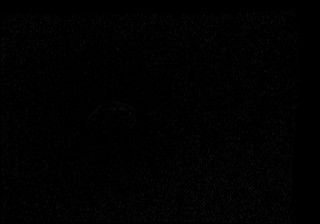
[im 6/40]
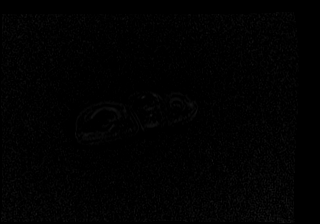
[im 12/40]
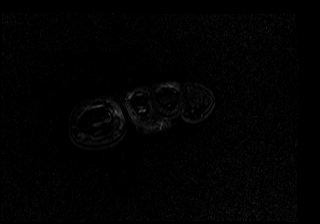
[im 17/40]
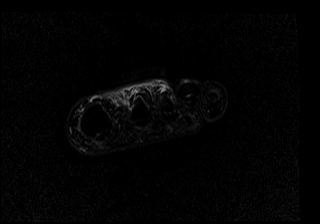
[im 23/40]
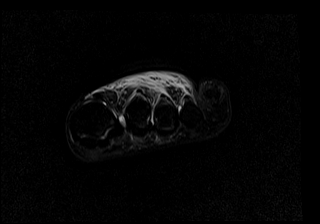
[im 28/40]
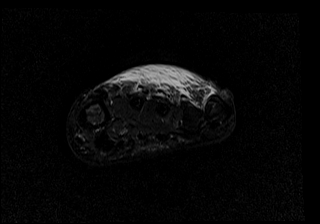
[im 34/40]
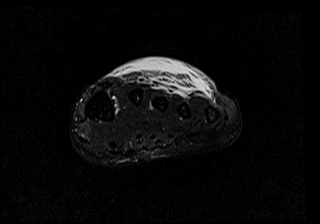
[im 40/40]
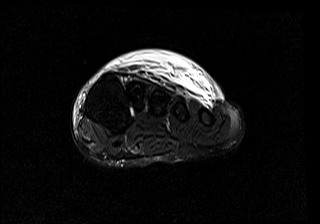

[Series 6: T1 · axial · 3.0mm · 0.62mm/px · z∈[-93,-23]mm · 5 of 23 slices shown (2 of 2)]
[im 1/23]
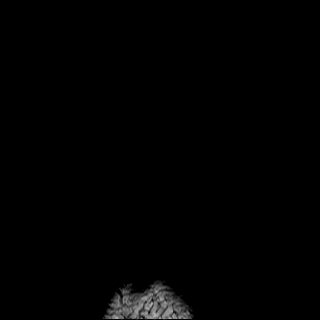
[im 6/23]
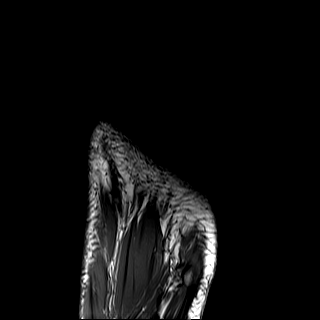
[im 12/23]
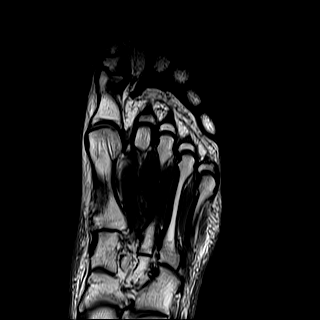
[im 17/23]
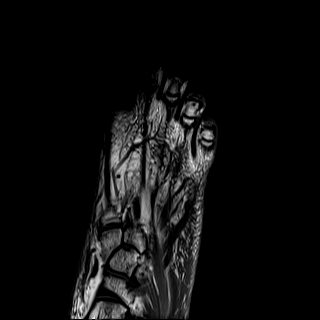
[im 23/23]
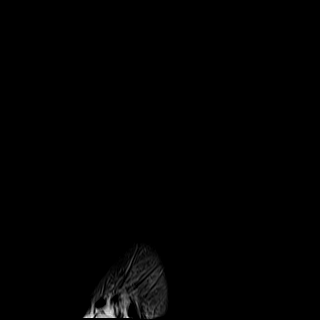

[Series 7: T2 fat-sat · axial · 3.0mm · 0.78mm/px · z∈[-93,-23]mm · 5 of 23 slices shown (2 of 2)]
[im 1/23]
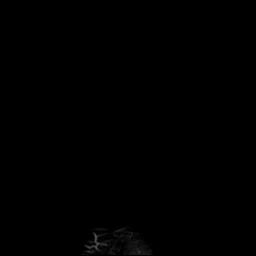
[im 6/23]
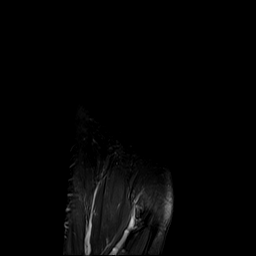
[im 12/23]
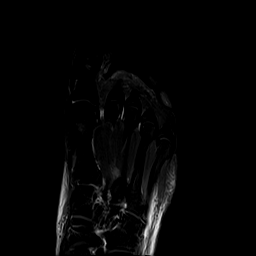
[im 17/23]
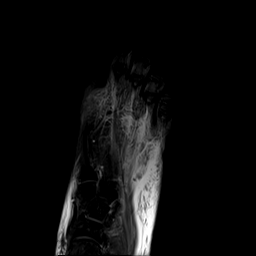
[im 23/23]
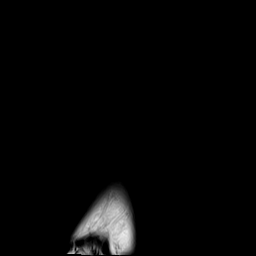

[Series 8: STIR · sagittal · 3.0mm · 0.78mm/px · 7 of 33 slices shown (1 of 2)]
[im 1/33]
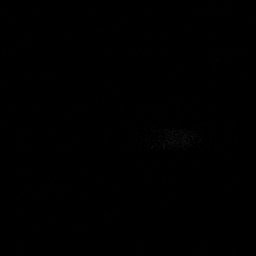
[im 6/33]
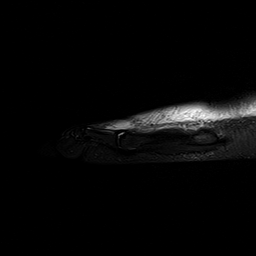
[im 11/33]
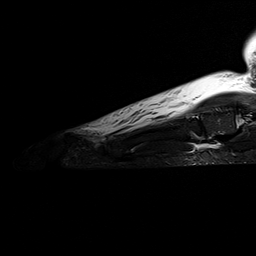
[im 17/33]
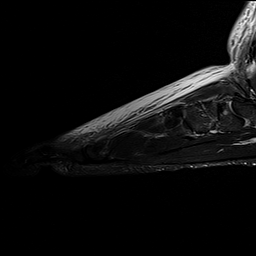
[im 22/33]
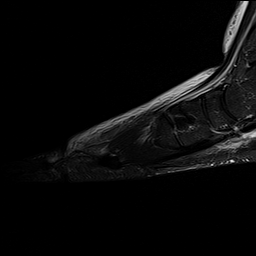
[im 27/33]
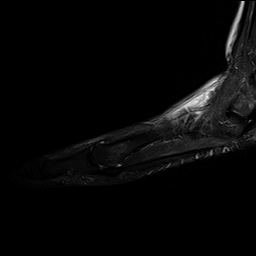
[im 33/33]
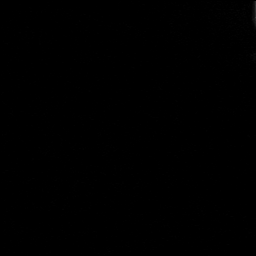

[Series 9: STIR · sagittal · 3.0mm · 0.94mm/px · 7 of 33 slices shown (2 of 2)]
[im 1/33]
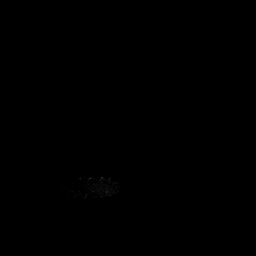
[im 6/33]
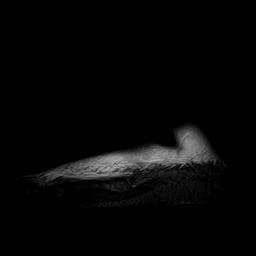
[im 11/33]
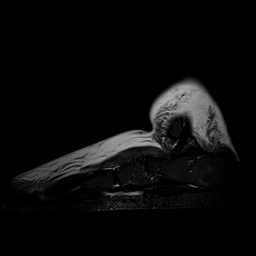
[im 17/33]
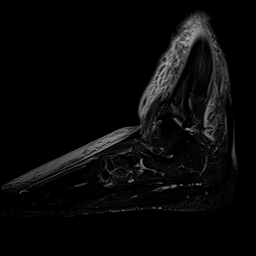
[im 22/33]
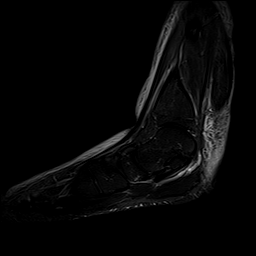
[im 27/33]
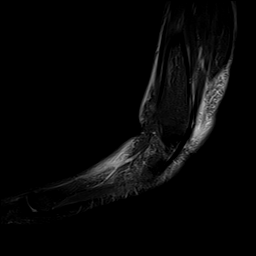
[im 33/33]
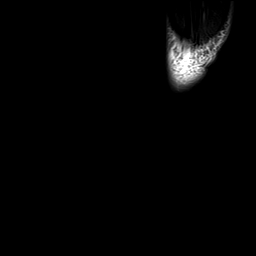

[40 of 40 positions shown; findings below may reference images not displayed]

FINDINGS: Bones/Joint/Cartilage

Faint marrow edema in the distal metaphysis of the first metatarsal
is nonspecific. There is also some low-grade marrow edema along the
lateral distal head of the proximal phalanx of the great toe on
image 11 series 7 which may be secondary to arthropathy. There is
some subtle marrow edema in the proximal metaphysis of the fifth
metatarsal. These findings are mainly observed on the T2 fat
saturated images, there is little in the way of corresponding
reduced T1 signal.

Large field of view sagittal series (series 9) was added at the
discretion of the technologist to include the posterior ankle and
heel. Please be aware that this large field of view series lacks the
spatial resolution associated with dedicated ankle imaging, and this
single series may provide a quick assessment for major abnormalities
in the ankle but is not considered a diagnostic examination of the
ankle. These images show subtle marrow edema or vascular remnant in
the neck of the calcaneus as well with as a circumferential
subcutaneous edema in the ankle which tracks into the dorsum of the
foot.

Ligaments

The Lisfranc ligament appears intact. Ligamentous structures show
seated with the first digit sesamoids appear normal. I do not
observe a plantar plate injury.

Muscles and Tendons

No substantial regional muscular abnormality is observed.

Soft tissues

Substantial subcutaneous edema tracking in the dorsum of the foot.
Circumferential subcutaneous edema in the ankle. Cellulitis is not
excluded.
IMPRESSION: 1. Small region of marrow edema laterally in the head of the
proximal phalanx great toe is likely secondary to arthropathy.
2. Indistinct accentuated T2 signal in the distal metaphysis of the
first metatarsal and proximal metaphysis of the fifth metatarsal is
nonspecific. Low-level stress reaction is not excluded I do not see
an overt stress fracture.
3. Substantial dorsal subcutaneous edema in the foot and
circumferential edema along the ankle. While nonspecific, cellulitis
is not excluded.

## 2021-05-10 ENCOUNTER — Ambulatory Visit (INDEPENDENT_AMBULATORY_CARE_PROVIDER_SITE_OTHER): Payer: Medicaid Other | Admitting: Vascular Surgery

## 2021-05-10 ENCOUNTER — Ambulatory Visit (INDEPENDENT_AMBULATORY_CARE_PROVIDER_SITE_OTHER): Payer: Medicaid Other

## 2021-05-10 ENCOUNTER — Encounter (INDEPENDENT_AMBULATORY_CARE_PROVIDER_SITE_OTHER): Payer: Self-pay

## 2021-05-10 ENCOUNTER — Other Ambulatory Visit (INDEPENDENT_AMBULATORY_CARE_PROVIDER_SITE_OTHER): Payer: Self-pay | Admitting: Internal Medicine

## 2021-05-10 ENCOUNTER — Other Ambulatory Visit: Payer: Self-pay

## 2021-05-10 VITALS — BP 133/73 | HR 135 | Ht 68.0 in | Wt 162.0 lb

## 2021-05-10 DIAGNOSIS — M79605 Pain in left leg: Secondary | ICD-10-CM

## 2021-05-10 DIAGNOSIS — I10 Essential (primary) hypertension: Secondary | ICD-10-CM

## 2021-05-10 DIAGNOSIS — R600 Localized edema: Secondary | ICD-10-CM

## 2021-05-10 DIAGNOSIS — E782 Mixed hyperlipidemia: Secondary | ICD-10-CM | POA: Diagnosis not present

## 2021-05-10 DIAGNOSIS — I70219 Atherosclerosis of native arteries of extremities with intermittent claudication, unspecified extremity: Secondary | ICD-10-CM | POA: Diagnosis not present

## 2021-05-10 DIAGNOSIS — M79604 Pain in right leg: Secondary | ICD-10-CM | POA: Diagnosis not present

## 2021-05-10 NOTE — Progress Notes (Signed)
MRN : [id number]  Auther Lyerly. is a 40 y.o. (15-Jun-1981) male who presents with chief complaint of legs hurt.  History of Present Illness:   The patient is seen for evaluation of painful lower extremities. Patient notes the pain is variable and not always associated with activity.  The pain is somewhat consistent day to day occurring on most days. The patient notes the pain also occurs with standing and routinely seems worse as the day wears on. The pain has been progressive over the past several years. The patient states these symptoms are causing  a profound negative impact on quality of life and daily activities.  The patient denies rest pain or dangling of an extremity off the side of the bed during the night for relief. No open wounds or sores at this time. No history of DVT or phlebitis. No prior interventions or surgeries.  There is a  history of back problems and DJD of the lumbar and sacral spine.    Venous duplex shows a patent deep venous system and mild reflux.  Arterial duplex shows monophasic signals at the bilateral femoral level suggesting severe aorta iliac disease.  Current Meds  Medication Sig   pregabalin (LYRICA) 75 MG capsule    [DISCONTINUED] amLODipine (NORVASC) 5 MG tablet    [DISCONTINUED] gabapentin (NEURONTIN) 300 MG capsule Take by mouth.   [DISCONTINUED] HYDROcodone-acetaminophen (NORCO/VICODIN) 5-325 MG tablet TAKE 1 TABLET BY MOUTH EVERY 6 TO 8 HOURS AS NEEDED FOR SEVERE PAIN    Past Medical History:  Diagnosis Date   Anxiety    Atherosclerotic PVD with intermittent claudication (HCC)    Back pain    Collagen vascular disease (HCC)    Dyspnea    Dysrhythmia    Elevated lipids    Hypertension    Nausea    Seropositive rheumatoid arthritis (HCC)     Past Surgical History:  Procedure Laterality Date   COLONOSCOPY WITH PROPOFOL N/A 04/21/2018   Procedure: COLONOSCOPY WITH PROPOFOL;  Surgeon: [name], MD;  Location: [facility] ENDOSCOPY;   Service: Gastroenterology;  Laterality: N/A;   ESOPHAGOGASTRODUODENOSCOPY (EGD) WITH PROPOFOL N/A 04/21/2018   Procedure: ESOPHAGOGASTRODUODENOSCOPY (EGD) WITH PROPOFOL;  Surgeon: [name], MD;  Location: [facility] ENDOSCOPY;  Service: Gastroenterology;  Laterality: N/A;   HIP PINNING Right    TOTAL HIP 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  No Known Allergies   REVIEW OF SYSTEMS (Negative unless checked)  Constitutional: [] Weight loss  [] Fever  [] Chills Cardiac: [] Chest pain   [] Chest pressure   [] Palpitations   [] Shortness of breath when laying flat   [] Shortness of breath with exertion. Vascular:  [x] Pain in legs with walking   [x] Pain in legs at rest  [] History of DVT   [] Phlebitis   [x] Swelling in legs   [] Varicose veins   [] Non-healing ulcers Pulmonary:   [] Uses home oxygen   [] Productive cough   [] Hemoptysis   [] Wheeze  [] COPD   [] Asthma Neurologic:  [] Dizziness   []   Seizures   [] History of stroke   [] History of TIA  [] Aphasia   [] Vissual changes   [] Weakness or numbness in arm   [x] Weakness or numbness in leg Musculoskeletal:   [] Joint swelling   [] Joint pain   [x] Low back pain Hematologic:  [] Easy bruising  [] Easy bleeding   [] Hypercoagulable state   [] Anemic Gastrointestinal:  [] Diarrhea   [] Vomiting  [] Gastroesophageal reflux/heartburn   [] Difficulty swallowing. Genitourinary:  [] Chronic kidney disease   [] Difficult urination  [] Frequent urination   [] Blood in urine Skin:  [] Rashes   [] Ulcers  Psychological:  [] History of anxiety   []   History of major depression.  Physical Examination  Vitals:   05/10/21 1058  BP: 133/73  Pulse: (!) 135  Weight: 162 lb (73.5 kg)  Height: 5\' 8"  (1.727 m)   Body mass index is 24.63 kg/m. Gen: WD/WN, NAD Head: [place]/AT, No temporalis wasting.  Ear/Nose/Throat: Hearing grossly intact, nares w/o erythema or drainage Eyes: PER, EOMI, sclera nonicteric.  Neck: Supple, no masses.  No bruit or JVD.  Pulmonary:  Good air movement, no audible wheezing, no use of accessory muscles.  Cardiac: RRR, normal S1, S2, no Murmurs. Vascular:  No wounds or sores Vessel Right Left  Radial Palpable Palpable  Femoral Not Palpable Not Palpable  PT Not Palpable Not Palpable  DP Not Palpable Not Palpable  Gastrointestinal: soft, non-distended. No guarding/no peritoneal signs.  Musculoskeletal: M/S 5/5 throughout.  No visible deformity.  Neurologic: CN 2-12 intact. Pain and light touch intact in extremities.  Symmetrical.  Speech is fluent. Motor exam as listed above. Psychiatric: Judgment intact, Mood & affect appropriate for pt's clinical situation. Dermatologic: No rashes or ulcers noted.  No changes consistent with cellulitis.   CBC Lab Results  Component Value Date   WBC 13.2 (H) 04/09/2021   HGB 13.9 04/09/2021   HCT 41.8 04/09/2021   MCV 88.9 04/09/2021   PLT 195 04/09/2021    BMET    Component Value Date/Time   NA 135 04/09/2021 0733   NA 140 03/30/2018 1404   K 4.3 04/09/2021 0733   CL 98 04/09/2021 0733   CO2 28 04/09/2021 0733   GLUCOSE 101 (H) 04/09/2021 0733   BUN 25 (H) 04/09/2021 0733   BUN 9 03/30/2018 1404   CREATININE 0.85 04/09/2021 0733   CALCIUM 9.0 04/09/2021 0733   GFRNONAA >60 04/09/2021 0733   GFRAA 129 03/30/2018 1404   CrCl cannot be calculated (Patient's most recent lab result is older than the maximum 21 days allowed.).  COAG Lab Results  Component Value Date   INR 0.91 10/09/2016    Radiology MR FOOT LEFT WO CONTRAST  Result Date:  04/25/2021 CLINICAL DATA:  Ongoing left foot pain. History of rheumatoid arthritis EXAM: MRI OF THE LEFT FOOT WITHOUT CONTRAST TECHNIQUE: Multiplanar, multisequence MR imaging of the left forefoot was performed. No intravenous contrast was administered. COMPARISON:  None. FINDINGS: Bones/Joint/Cartilage Faint marrow edema in the distal metaphysis of the first metatarsal is nonspecific. There is also some low-grade marrow edema along the lateral distal head of the proximal phalanx of the great toe on image 11 series 7 which may be secondary to arthropathy. There is some subtle marrow edema in the proximal metaphysis of the fifth metatarsal. These findings are mainly observed on the T2 fat saturated images, there is little in the way of corresponding reduced T1 signal. Large field of view sagittal series (series 9) was added at the discretion of the technologist to include the posterior ankle  and heel. Please be aware that this large field of view series lacks the spatial resolution associated with dedicated ankle imaging, and this single series may provide a quick assessment for major abnormalities in the ankle but is not considered a diagnostic examination of the ankle. These images show subtle marrow edema or vascular remnant in the neck of the calcaneus as well with as a circumferential subcutaneous edema in the ankle which tracks into the dorsum of the foot. Ligaments The Lisfranc ligament appears intact. Ligamentous structures show seated with the first digit sesamoids appear normal. I do not observe a plantar plate injury. Muscles and Tendons No substantial regional muscular abnormality is observed. Soft tissues Substantial subcutaneous edema tracking in the dorsum of the foot. Circumferential subcutaneous edema in the ankle. Cellulitis is not excluded. IMPRESSION: 1. Small region of marrow edema laterally in the head of the proximal phalanx great toe is likely secondary to arthropathy. 2. Indistinct  accentuated T2 signal in the distal metaphysis of the first metatarsal and proximal metaphysis of the fifth metatarsal is nonspecific. Low-level stress reaction is not excluded I do not see an overt stress fracture. 3. Substantial dorsal subcutaneous edema in the foot and circumferential edema along the ankle. While nonspecific, cellulitis is not excluded. Electronically Signed   By: Van Clines M.D.   On: 04/25/2021 11:23     Assessment/Plan 1. Pain in both lower extremities Recommend:  The patient has evidence of severe atherosclerotic changes of both lower extremities with rest pain that is associated with preulcerative changes and impending tissue loss of both feet.  He feels his left leg is worse. This represents a limb threatening ischemia and places the patient at the risk for limb loss.  Patient should undergo angiography of the lower extremities with the hope for intervention for limb salvage.  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    The risks and benefits as well as the alternative therapies was discussed in detail with the patient.  All questions were answered.  Patient agrees to proceed with angiography.  The patient will follow up with me in the office after the procedure.    A total of 60 minutes was spent with this patient and greater than 50% was spent in counseling and coordination of care with the patient.  Discussion included the treatment options for vascular disease including indications for surgery and intervention.  Also discussed is the appropriate timing of treatment.  In addition medical therapy was discussed.     2. Atherosclerotic peripheral vascular disease with intermittent claudication ([place]) See #1  3. Benign essential hypertension Continue antihypertensive medications as already ordered, these medications have been reviewed and there are no changes at this time.   4. Mixed  hyperlipidemia Continue statin as ordered and reviewed, no changes at this time     [name], MD  05/10/2021 11:10 AM

## 2021-05-10 NOTE — H&P (View-Only) (Signed)
MRN : [id number]  Auther Lyerly. is a 40 y.o. (15-Jun-1981) male who presents with chief complaint of legs hurt.  History of Present Illness:   The patient is seen for evaluation of painful lower extremities. Patient notes the pain is variable and not always associated with activity.  The pain is somewhat consistent day to day occurring on most days. The patient notes the pain also occurs with standing and routinely seems worse as the day wears on. The pain has been progressive over the past several years. The patient states these symptoms are causing  a profound negative impact on quality of life and daily activities.  The patient denies rest pain or dangling of an extremity off the side of the bed during the night for relief. No open wounds or sores at this time. No history of DVT or phlebitis. No prior interventions or surgeries.  There is a  history of back problems and DJD of the lumbar and sacral spine.    Venous duplex shows a patent deep venous system and mild reflux.  Arterial duplex shows monophasic signals at the bilateral femoral level suggesting severe aorta iliac disease.  Current Meds  Medication Sig   pregabalin (LYRICA) 75 MG capsule    [DISCONTINUED] amLODipine (NORVASC) 5 MG tablet    [DISCONTINUED] gabapentin (NEURONTIN) 300 MG capsule Take by mouth.   [DISCONTINUED] HYDROcodone-acetaminophen (NORCO/VICODIN) 5-325 MG tablet TAKE 1 TABLET BY MOUTH EVERY 6 TO 8 HOURS AS NEEDED FOR SEVERE PAIN    Past Medical History:  Diagnosis Date   Anxiety    Atherosclerotic PVD with intermittent claudication (HCC)    Back pain    Collagen vascular disease (HCC)    Dyspnea    Dysrhythmia    Elevated lipids    Hypertension    Nausea    Seropositive rheumatoid arthritis (HCC)     Past Surgical History:  Procedure Laterality Date   COLONOSCOPY WITH PROPOFOL N/A 04/21/2018   Procedure: COLONOSCOPY WITH PROPOFOL;  Surgeon: [name], MD;  Location: [facility] ENDOSCOPY;   Service: Gastroenterology;  Laterality: N/A;   ESOPHAGOGASTRODUODENOSCOPY (EGD) WITH PROPOFOL N/A 04/21/2018   Procedure: ESOPHAGOGASTRODUODENOSCOPY (EGD) WITH PROPOFOL;  Surgeon: [name], MD;  Location: [facility] ENDOSCOPY;  Service: Gastroenterology;  Laterality: N/A;   HIP PINNING Right    TOTAL HIP 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  No Known Allergies   REVIEW OF SYSTEMS (Negative unless checked)  Constitutional: [] Weight loss  [] Fever  [] Chills Cardiac: [] Chest pain   [] Chest pressure   [] Palpitations   [] Shortness of breath when laying flat   [] Shortness of breath with exertion. Vascular:  [x] Pain in legs with walking   [x] Pain in legs at rest  [] History of DVT   [] Phlebitis   [x] Swelling in legs   [] Varicose veins   [] Non-healing ulcers Pulmonary:   [] Uses home oxygen   [] Productive cough   [] Hemoptysis   [] Wheeze  [] COPD   [] Asthma Neurologic:  [] Dizziness   []   Seizures   [] History of stroke   [] History of TIA  [] Aphasia   [] Vissual changes   [] Weakness or numbness in arm   [x] Weakness or numbness in leg Musculoskeletal:   [] Joint swelling   [] Joint pain   [x] Low back pain Hematologic:  [] Easy bruising  [] Easy bleeding   [] Hypercoagulable state   [] Anemic Gastrointestinal:  [] Diarrhea   [] Vomiting  [] Gastroesophageal reflux/heartburn   [] Difficulty swallowing. Genitourinary:  [] Chronic kidney disease   [] Difficult urination  [] Frequent urination   [] Blood in urine Skin:  [] Rashes   [] Ulcers  Psychological:  [] History of anxiety   []   History of major depression.  Physical Examination  Vitals:   05/10/21 1058  BP: 133/73  Pulse: (!) 135  Weight: 162 lb (73.5 kg)  Height: 5\' 8"  (1.727 m)   Body mass index is 24.63 kg/m. Gen: WD/WN, NAD Head: [place]/AT, No temporalis wasting.  Ear/Nose/Throat: Hearing grossly intact, nares w/o erythema or drainage Eyes: PER, EOMI, sclera nonicteric.  Neck: Supple, no masses.  No bruit or JVD.  Pulmonary:  Good air movement, no audible wheezing, no use of accessory muscles.  Cardiac: RRR, normal S1, S2, no Murmurs. Vascular:  No wounds or sores Vessel Right Left  Radial Palpable Palpable  Femoral Not Palpable Not Palpable  PT Not Palpable Not Palpable  DP Not Palpable Not Palpable  Gastrointestinal: soft, non-distended. No guarding/no peritoneal signs.  Musculoskeletal: M/S 5/5 throughout.  No visible deformity.  Neurologic: CN 2-12 intact. Pain and light touch intact in extremities.  Symmetrical.  Speech is fluent. Motor exam as listed above. Psychiatric: Judgment intact, Mood & affect appropriate for pt's clinical situation. Dermatologic: No rashes or ulcers noted.  No changes consistent with cellulitis.   CBC Lab Results  Component Value Date   WBC 13.2 (H) 04/09/2021   HGB 13.9 04/09/2021   HCT 41.8 04/09/2021   MCV 88.9 04/09/2021   PLT 195 04/09/2021    BMET    Component Value Date/Time   NA 135 04/09/2021 0733   NA 140 03/30/2018 1404   K 4.3 04/09/2021 0733   CL 98 04/09/2021 0733   CO2 28 04/09/2021 0733   GLUCOSE 101 (H) 04/09/2021 0733   BUN 25 (H) 04/09/2021 0733   BUN 9 03/30/2018 1404   CREATININE 0.85 04/09/2021 0733   CALCIUM 9.0 04/09/2021 0733   GFRNONAA >60 04/09/2021 0733   GFRAA 129 03/30/2018 1404   CrCl cannot be calculated (Patient's most recent lab result is older than the maximum 21 days allowed.).  COAG Lab Results  Component Value Date   INR 0.91 10/09/2016    Radiology MR FOOT LEFT WO CONTRAST  Result Date:  04/25/2021 CLINICAL DATA:  Ongoing left foot pain. History of rheumatoid arthritis EXAM: MRI OF THE LEFT FOOT WITHOUT CONTRAST TECHNIQUE: Multiplanar, multisequence MR imaging of the left forefoot was performed. No intravenous contrast was administered. COMPARISON:  None. FINDINGS: Bones/Joint/Cartilage Faint marrow edema in the distal metaphysis of the first metatarsal is nonspecific. There is also some low-grade marrow edema along the lateral distal head of the proximal phalanx of the great toe on image 11 series 7 which may be secondary to arthropathy. There is some subtle marrow edema in the proximal metaphysis of the fifth metatarsal. These findings are mainly observed on the T2 fat saturated images, there is little in the way of corresponding reduced T1 signal. Large field of view sagittal series (series 9) was added at the discretion of the technologist to include the posterior ankle  and heel. Please be aware that this large field of view series lacks the spatial resolution associated with dedicated ankle imaging, and this single series may provide a quick assessment for major abnormalities in the ankle but is not considered a diagnostic examination of the ankle. These images show subtle marrow edema or vascular remnant in the neck of the calcaneus as well with as a circumferential subcutaneous edema in the ankle which tracks into the dorsum of the foot. Ligaments The Lisfranc ligament appears intact. Ligamentous structures show seated with the first digit sesamoids appear normal. I do not observe a plantar plate injury. Muscles and Tendons No substantial regional muscular abnormality is observed. Soft tissues Substantial subcutaneous edema tracking in the dorsum of the foot. Circumferential subcutaneous edema in the ankle. Cellulitis is not excluded. IMPRESSION: 1. Small region of marrow edema laterally in the head of the proximal phalanx great toe is likely secondary to arthropathy. 2. Indistinct  accentuated T2 signal in the distal metaphysis of the first metatarsal and proximal metaphysis of the fifth metatarsal is nonspecific. Low-level stress reaction is not excluded I do not see an overt stress fracture. 3. Substantial dorsal subcutaneous edema in the foot and circumferential edema along the ankle. While nonspecific, cellulitis is not excluded. Electronically Signed   By: Van Clines M.D.   On: 04/25/2021 11:23     Assessment/Plan 1. Pain in both lower extremities Recommend:  The patient has evidence of severe atherosclerotic changes of both lower extremities with rest pain that is associated with preulcerative changes and impending tissue loss of both feet.  He feels his left leg is worse. This represents a limb threatening ischemia and places the patient at the risk for limb loss.  Patient should undergo angiography of the lower extremities with the hope for intervention for limb salvage.  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    The risks and benefits as well as the alternative therapies was discussed in detail with the patient.  All questions were answered.  Patient agrees to proceed with angiography.  The patient will follow up with me in the office after the procedure.    A total of 60 minutes was spent with this patient and greater than 50% was spent in counseling and coordination of care with the patient.  Discussion included the treatment options for vascular disease including indications for surgery and intervention.  Also discussed is the appropriate timing of treatment.  In addition medical therapy was discussed.     2. Atherosclerotic peripheral vascular disease with intermittent claudication ([place]) See #1  3. Benign essential hypertension Continue antihypertensive medications as already ordered, these medications have been reviewed and there are no changes at this time.   4. Mixed  hyperlipidemia Continue statin as ordered and reviewed, no changes at this time     [name], MD  05/10/2021 11:10 AM

## 2021-05-11 ENCOUNTER — Telehealth (INDEPENDENT_AMBULATORY_CARE_PROVIDER_SITE_OTHER): Payer: Self-pay

## 2021-05-11 NOTE — Telephone Encounter (Signed)
Called to schedule the patient for a aorta iliac angio with Dr. Gilda Crease for 05/29/21 with a 6:45 am arrival time to the [facility]. Pre-procedure instructions were discussed and will be mailed.

## 2021-05-13 ENCOUNTER — Encounter (INDEPENDENT_AMBULATORY_CARE_PROVIDER_SITE_OTHER): Payer: Self-pay | Admitting: Vascular Surgery

## 2021-05-23 ENCOUNTER — Encounter (INDEPENDENT_AMBULATORY_CARE_PROVIDER_SITE_OTHER): Payer: Self-pay

## 2021-05-29 ENCOUNTER — Other Ambulatory Visit: Payer: Self-pay

## 2021-05-29 ENCOUNTER — Ambulatory Visit: Payer: Medicaid Other

## 2021-05-29 ENCOUNTER — Other Ambulatory Visit (INDEPENDENT_AMBULATORY_CARE_PROVIDER_SITE_OTHER): Payer: Self-pay | Admitting: Nurse Practitioner

## 2021-05-29 ENCOUNTER — Encounter: Payer: Self-pay | Admitting: Vascular Surgery

## 2021-05-29 ENCOUNTER — Encounter
Admission: RE | Disposition: A | Discharge status: Home Health | Payer: Self-pay | Source: Home / Self Care | Attending: Vascular Surgery

## 2021-05-29 ENCOUNTER — Inpatient Hospital Stay
Admission: RE | Admit: 2021-05-29 | Discharge: 2021-06-05 | DRG: 271 | Disposition: A | Discharge status: Home Health | Payer: Medicaid Other | Attending: Obstetrics and Gynecology | Admitting: Obstetrics and Gynecology

## 2021-05-29 DIAGNOSIS — F17210 Nicotine dependence, cigarettes, uncomplicated: Secondary | ICD-10-CM | POA: Diagnosis present

## 2021-05-29 DIAGNOSIS — X58XXXA Exposure to other specified factors, initial encounter: Secondary | ICD-10-CM | POA: Diagnosis present

## 2021-05-29 DIAGNOSIS — M47818 Spondylosis without myelopathy or radiculopathy, sacral and sacrococcygeal region: Secondary | ICD-10-CM | POA: Diagnosis present

## 2021-05-29 DIAGNOSIS — F419 Anxiety disorder, unspecified: Secondary | ICD-10-CM | POA: Diagnosis present

## 2021-05-29 DIAGNOSIS — I700 Atherosclerosis of aorta: Secondary | ICD-10-CM | POA: Diagnosis present

## 2021-05-29 DIAGNOSIS — M47816 Spondylosis without myelopathy or radiculopathy, lumbar region: Secondary | ICD-10-CM | POA: Diagnosis present

## 2021-05-29 DIAGNOSIS — I745 Embolism and thrombosis of iliac artery: Secondary | ICD-10-CM | POA: Diagnosis present

## 2021-05-29 DIAGNOSIS — I70213 Atherosclerosis of native arteries of extremities with intermittent claudication, bilateral legs: Secondary | ICD-10-CM | POA: Diagnosis present

## 2021-05-29 DIAGNOSIS — Z4659 Encounter for fitting and adjustment of other gastrointestinal appliance and device: Secondary | ICD-10-CM

## 2021-05-29 DIAGNOSIS — Z791 Long term (current) use of non-steroidal anti-inflammatories (NSAID): Secondary | ICD-10-CM

## 2021-05-29 DIAGNOSIS — I70249 Atherosclerosis of native arteries of left leg with ulceration of unspecified site: Secondary | ICD-10-CM | POA: Diagnosis not present

## 2021-05-29 DIAGNOSIS — S90822A Blister (nonthermal), left foot, initial encounter: Secondary | ICD-10-CM | POA: Diagnosis present

## 2021-05-29 DIAGNOSIS — Z79899 Other long term (current) drug therapy: Secondary | ICD-10-CM

## 2021-05-29 DIAGNOSIS — I743 Embolism and thrombosis of arteries of the lower extremities: Secondary | ICD-10-CM | POA: Diagnosis present

## 2021-05-29 DIAGNOSIS — D62 Acute posthemorrhagic anemia: Secondary | ICD-10-CM | POA: Diagnosis not present

## 2021-05-29 DIAGNOSIS — M059 Rheumatoid arthritis with rheumatoid factor, unspecified: Secondary | ICD-10-CM | POA: Diagnosis present

## 2021-05-29 DIAGNOSIS — L97909 Non-pressure chronic ulcer of unspecified part of unspecified lower leg with unspecified severity: Secondary | ICD-10-CM | POA: Diagnosis present

## 2021-05-29 DIAGNOSIS — L97529 Non-pressure chronic ulcer of other part of left foot with unspecified severity: Secondary | ICD-10-CM | POA: Diagnosis present

## 2021-05-29 DIAGNOSIS — I70229 Atherosclerosis of native arteries of extremities with rest pain, unspecified extremity: Secondary | ICD-10-CM

## 2021-05-29 DIAGNOSIS — E782 Mixed hyperlipidemia: Secondary | ICD-10-CM | POA: Diagnosis present

## 2021-05-29 DIAGNOSIS — I10 Essential (primary) hypertension: Secondary | ICD-10-CM | POA: Diagnosis present

## 2021-05-29 DIAGNOSIS — D72829 Elevated white blood cell count, unspecified: Secondary | ICD-10-CM | POA: Diagnosis not present

## 2021-05-29 DIAGNOSIS — I70299 Other atherosclerosis of native arteries of extremities, unspecified extremity: Secondary | ICD-10-CM | POA: Diagnosis not present

## 2021-05-29 DIAGNOSIS — Z20822 Contact with and (suspected) exposure to covid-19: Secondary | ICD-10-CM | POA: Diagnosis present

## 2021-05-29 DIAGNOSIS — I7409 Other arterial embolism and thrombosis of abdominal aorta: Secondary | ICD-10-CM | POA: Diagnosis present

## 2021-05-29 DIAGNOSIS — M79605 Pain in left leg: Secondary | ICD-10-CM | POA: Diagnosis present

## 2021-05-29 DIAGNOSIS — I160 Hypertensive urgency: Secondary | ICD-10-CM | POA: Diagnosis not present

## 2021-05-29 DIAGNOSIS — Z96641 Presence of right artificial hip joint: Secondary | ICD-10-CM | POA: Diagnosis present

## 2021-05-29 DIAGNOSIS — I70219 Atherosclerosis of native arteries of extremities with intermittent claudication, unspecified extremity: Secondary | ICD-10-CM

## 2021-05-29 DIAGNOSIS — K5900 Constipation, unspecified: Secondary | ICD-10-CM | POA: Diagnosis present

## 2021-05-29 DIAGNOSIS — I70239 Atherosclerosis of native arteries of right leg with ulceration of unspecified site: Secondary | ICD-10-CM | POA: Diagnosis not present

## 2021-05-29 HISTORY — PX: LOWER EXTREMITY ANGIOGRAPHY: CATH118251

## 2021-05-29 LAB — RESP PANEL BY RT-PCR (FLU A&B, COVID) ARPGX2
Influenza A by PCR: NEGATIVE
Influenza B by PCR: NEGATIVE
SARS Coronavirus 2 by RT PCR: NEGATIVE

## 2021-05-29 LAB — PROTIME-INR
INR: 1 (ref 0.8–1.2)
Prothrombin Time: 12.7 seconds (ref 11.4–15.2)

## 2021-05-29 LAB — CBC
HCT: 40.5 % (ref 39.0–52.0)
Hemoglobin: 13.1 g/dL (ref 13.0–17.0)
MCH: 28.7 pg (ref 26.0–34.0)
MCHC: 32.3 g/dL (ref 30.0–36.0)
MCV: 88.8 fL (ref 80.0–100.0)
Platelets: 292 10*3/uL (ref 150–400)
RBC: 4.56 MIL/uL (ref 4.22–5.81)
RDW: 15.2 % (ref 11.5–15.5)
WBC: 7.6 10*3/uL (ref 4.0–10.5)
nRBC: 0 % (ref 0.0–0.2)

## 2021-05-29 LAB — CREATININE, SERUM
Creatinine, Ser: 0.83 mg/dL (ref 0.61–1.24)
GFR, Estimated: >60 mL/min (ref >60)

## 2021-05-29 LAB — APTT: aPTT: 37 seconds — ABNORMAL HIGH (ref 24–36)

## 2021-05-29 LAB — BUN: BUN: 27 mg/dL — ABNORMAL HIGH (ref 6–20)

## 2021-05-29 IMAGING — CT CT ANGIO AOBIFEM WO/W CM
2 of 10 series · 11 of 46 positions shown, 14 images · IV contrast (APPLIED)
Comparison: CT abdomen/pelvis [DATE]

CLINICAL DATA: Intermittent lower extremity pain. Evaluate for
peripheral arterial disease.

EXAM:
CT ANGIOGRAPHY OF ABDOMINAL AORTA WITH ILIOFEMORAL RUNOFF
TECHNIQUE: Multidetector CT imaging of the abdomen, pelvis and lower
extremities was performed using the standard protocol during bolus
administration of intravenous contrast. Multiplanar CT image
reconstructions and MIPs were obtained to evaluate the vascular
anatomy.
CONTRAST:  100mL OMNIPAQUE IOHEXOL 350 MG/ML SOLN

[Series 4: axial arterial upper · axial · arterial · 0.89mm/px · z∈[+687,+1440]mm · 10 of 305 slices shown, 13 images]
[im 36/305  soft-tissue]
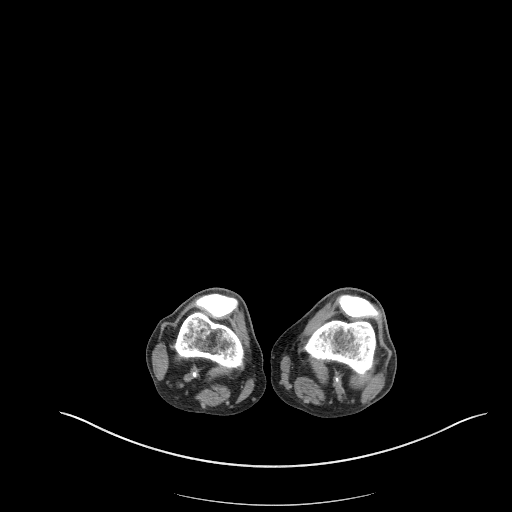
[im 36/305  bone]
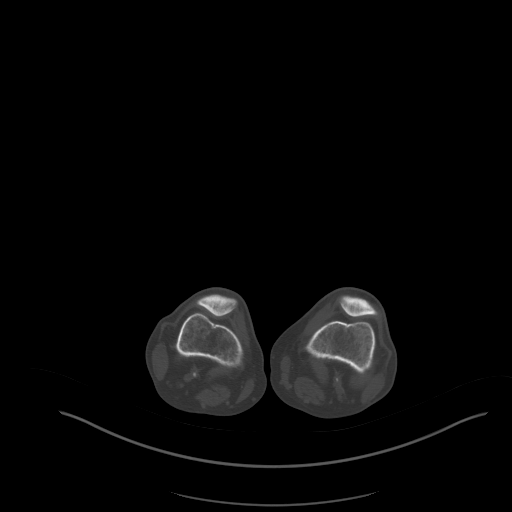
[im 72/305  soft-tissue]
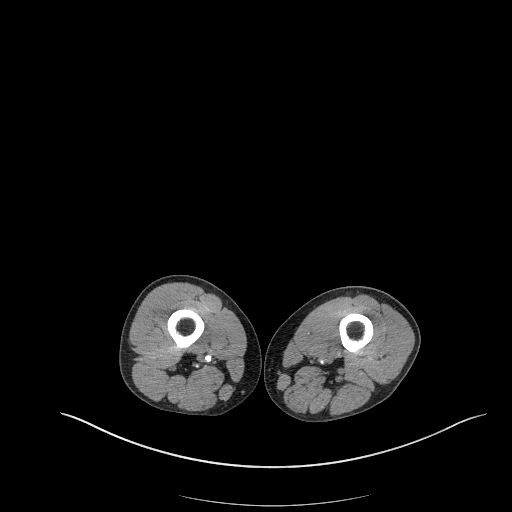
[im 108/305  soft-tissue]
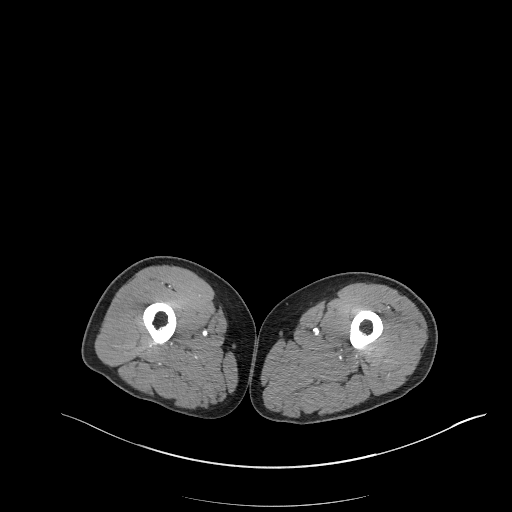
[im 144/305  soft-tissue]
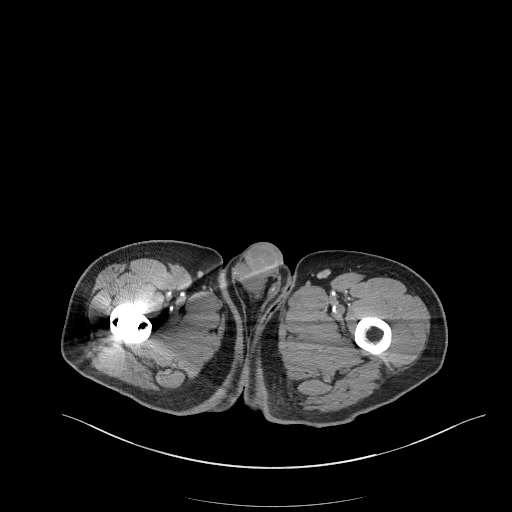
[im 179/305  soft-tissue]
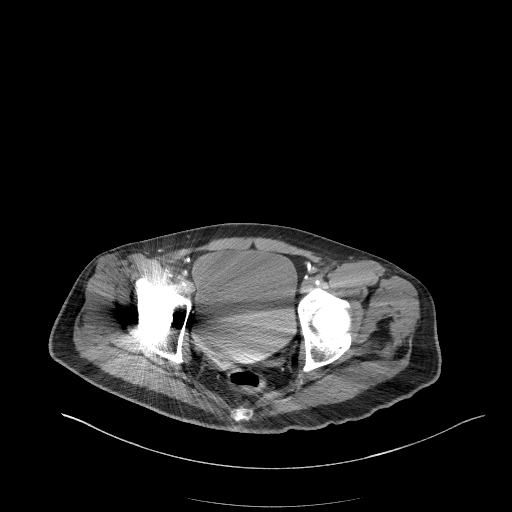
[im 215/305  soft-tissue]
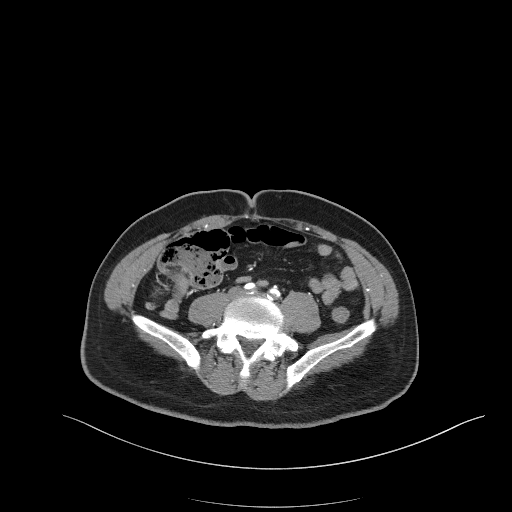
[im 233/305  lung]
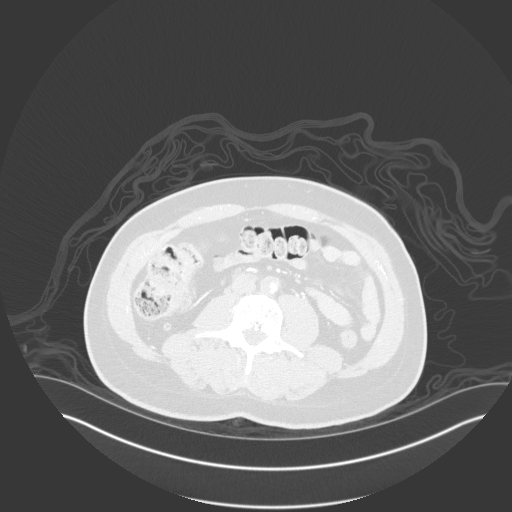
[im 251/305  soft-tissue]
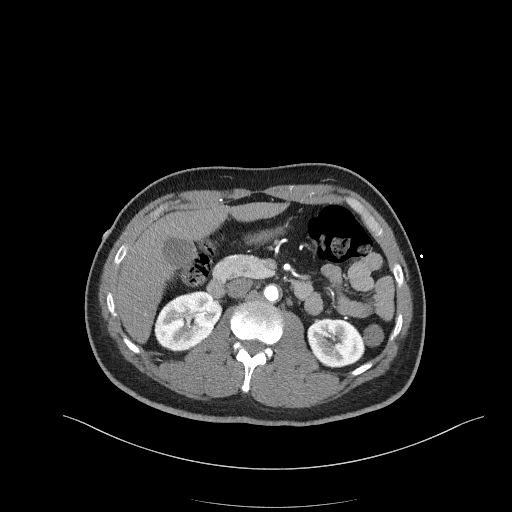
[im 251/305  lung]
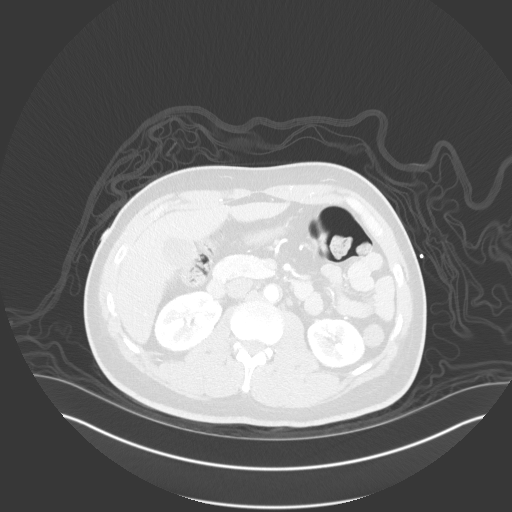
[im 269/305  lung]
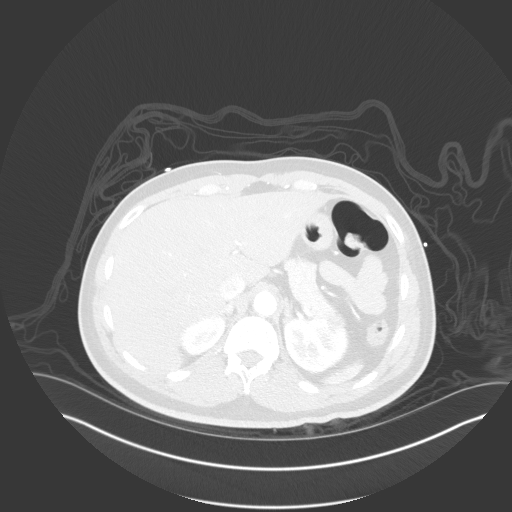
[im 287/305  soft-tissue]
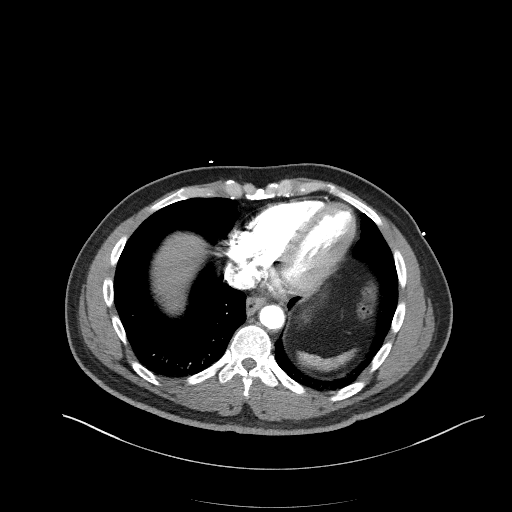
[im 287/305  lung]
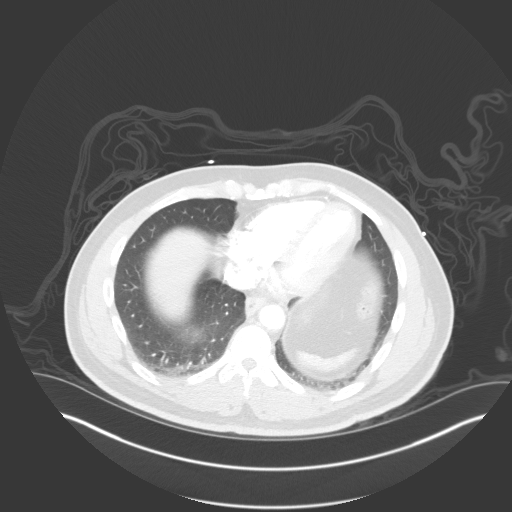

[Series 12: coronal lower · coronal · 0.59mm/px · 1 of 151 slices shown]
[im 76/151  soft-tissue]
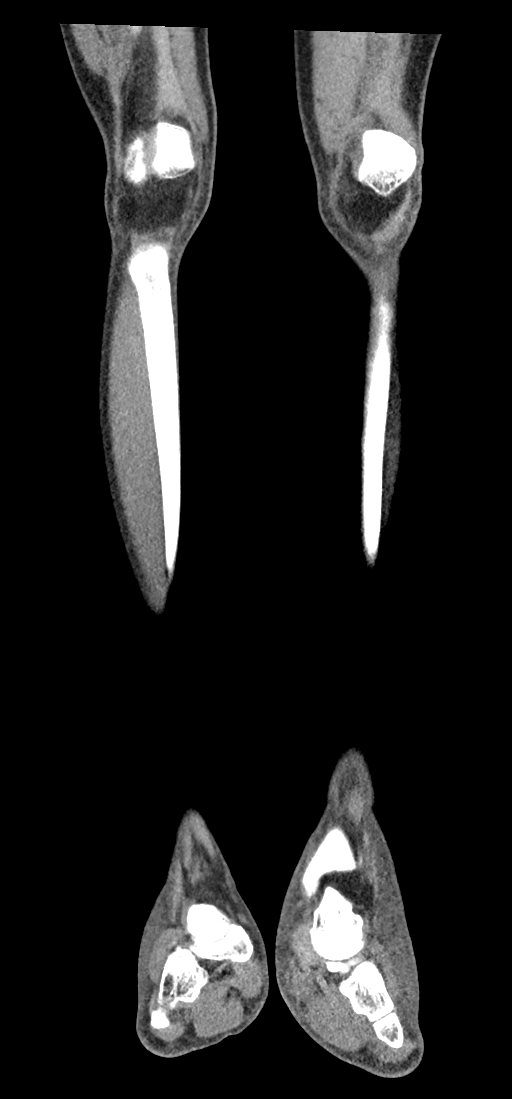

[11 of 46 positions shown; findings below may reference images not displayed]

FINDINGS: VASCULAR

Aorta: Heterogeneous atherosclerotic plaque, predominantly
fibrofatty visualized in the supra visceral abdominal aorta. Heavy
fibrofatty atherosclerotic plaque versus mural thrombus in the
infrarenal aorta resulting in complete occlusion. There is likely an
element of thrombus extending from the inferior aspect of the
occlusion into the left common iliac artery.

Celiac: Patent without evidence of aneurysm, dissection, vasculitis
or significant stenosis.

SMA: Patent without evidence of aneurysm, dissection, vasculitis or
significant stenosis.

Renals: Both renal arteries are patent without evidence of aneurysm,
dissection, vasculitis, fibromuscular dysplasia or significant
stenosis.

IMA: Occluded at the origin. The distal aspect reconstitutes via
collateral flow from the SMA.

RIGHT Lower Extremity

Inflow: Plaque extends into the common iliac artery resulting in
approximately 50% narrowing in the proximal and mid segment. The
internal iliac artery is occluded at the origin. The external iliac
artery is also diffusely diseased with limited flow. Likely
high-grade stenosis distally just proximal to the inguinal ligament.

Outflow: Small caliber common femoral artery secondary to proximal
occlusive disease. The profunda and superficial femoral arteries
remain patent. Mild atherosclerotic plaque results in mild stenosis
at Hunter's canal. The popliteal artery is mildly diseased but
patent.

Runoff: Multifocal runoff disease with proximal occlusion of the
anterior tibial artery. Probable 2 vessel runoff to the ankle.

LEFT Lower Extremity

Inflow: Atherosclerotic plaque and thrombus result in high-grade
stenosis of the left common iliac artery. Chronic occlusion of the
internal iliac artery. Focal occlusion of the proximal external
iliac artery.

Outflow: Small caliber common femoral artery secondary to proximal
occlusive disease. The profunda and superficial femoral arteries are
patent. Scattered plaque but difficult to assess for significant
stenosis given the overall small caliber of the vessel. Suspect
high-grade stenosis as the vessel exits the Hunter's canal. The
popliteal artery is diseased but patent.

Runoff: Multifocal runoff disease. Proximal occlusion of the
anterior tibial artery. Patent 2 vessel runoff to the ankle.

Veins: No focal venous abnormality.

Review of the MIP images confirms the above findings.

NON-VASCULAR

Lower chest: Dependent atelectasis.  No acute abnormality.

Hepatobiliary: No focal liver abnormality is seen. No gallstones,
gallbladder wall thickening, or biliary dilatation.

Pancreas: Unremarkable. No pancreatic ductal dilatation or
surrounding inflammatory changes.

Spleen: Normal in size without focal abnormality.

Adrenals/Urinary Tract: Normal adrenal glands. No hydronephrosis or
nephrolithiasis. Circumscribed 1.8 cm simple cyst in the upper pole
of the left kidney. No enhancing renal mass.

Stomach/Bowel: No evidence of obstruction or focal bowel wall
thickening. Normal appendix in the right lower quadrant. The
terminal ileum is unremarkable.

Lymphatic: No suspicious lymphadenopathy.

Reproductive: Prostate is unremarkable.

Other: No abdominal wall hernia or abnormality. No abdominopelvic
ascites.

Musculoskeletal: No acute or significant osseous findings. Surgical
changes of prior right hip arthroplasty.
IMPRESSION: VASCULAR

1. Severe aortoiliac occlusive disease with complete occlusion of
the aorta, multifocal occlusive and high-grade stenoses involving
the iliac arteries bilaterally.
2. Focal high-grade stenosis of the left superficial femoral artery
in the region of Hunter's canal.
3. Multifocal runoff disease bilaterally with chronic occlusion of
the anterior tibial arteries.
4. No evidence of aneurysm or dissection.

NON-VASCULAR

1. No acute abnormality within the abdomen or pelvis.
2. Ancillary findings as above.

Aortic Atherosclerosis ([GN]-[GN]).

## 2021-05-29 SURGERY — LOWER EXTREMITY ANGIOGRAPHY
Anesthesia: Moderate Sedation

## 2021-05-29 MED ORDER — HYDROMORPHONE HCL 1 MG/ML IJ SOLN
1.0000 mg | Freq: Once | INTRAMUSCULAR | Status: AC | PRN
  Administered 2021-05-29: 0.5 mg via INTRAVENOUS

## 2021-05-29 MED ORDER — HYDROMORPHONE HCL 1 MG/ML IJ SOLN
INTRAMUSCULAR | Status: AC
  Administered 2021-05-29: 0.5 mg
  Filled 2021-05-29: qty 1

## 2021-05-29 MED ORDER — LISINOPRIL 20 MG PO TABS
20.0000 mg | ORAL_TABLET | Freq: Every day | ORAL | Status: DC
  Administered 2021-05-30 – 2021-05-31 (×2): 20 mg via ORAL
  Filled 2021-05-29 (×2): qty 2

## 2021-05-29 MED ORDER — ONDANSETRON HCL 4 MG/2ML IJ SOLN: 4.0000 mg | Freq: Four times a day (QID) | INTRAMUSCULAR | Status: DC | PRN

## 2021-05-29 MED ORDER — ACETAMINOPHEN 325 MG PO TABS: 650.0000 mg | ORAL_TABLET | ORAL | Status: DC | PRN

## 2021-05-29 MED ORDER — DIPHENHYDRAMINE HCL 50 MG/ML IJ SOLN: 50.0000 mg | Freq: Once | INTRAMUSCULAR | Status: DC | PRN

## 2021-05-29 MED ORDER — OXYCODONE HCL 5 MG PO TABS
5.0000 mg | ORAL_TABLET | ORAL | Status: DC | PRN
  Administered 2021-05-29 – 2021-06-01 (×7): 10 mg via ORAL
  Filled 2021-05-29 (×7): qty 2

## 2021-05-29 MED ORDER — FENTANYL CITRATE (PF) 100 MCG/2ML IJ SOLN
INTRAMUSCULAR | Status: DC | PRN
  Administered 2021-05-29: 25 ug via INTRAVENOUS
  Administered 2021-05-29: 50 ug via INTRAVENOUS

## 2021-05-29 MED ORDER — SODIUM CHLORIDE 0.9 % IV SOLN: 250.0000 mL | INTRAVENOUS | Status: DC | PRN

## 2021-05-29 MED ORDER — SODIUM CHLORIDE 0.9% FLUSH
3.0000 mL | Freq: Two times a day (BID) | INTRAVENOUS | Status: DC
  Administered 2021-05-29 – 2021-05-31 (×4): 3 mL via INTRAVENOUS

## 2021-05-29 MED ORDER — LABETALOL HCL 5 MG/ML IV SOLN
10.0000 mg | INTRAVENOUS | Status: DC | PRN
  Administered 2021-05-29: 10 mg via INTRAVENOUS

## 2021-05-29 MED ORDER — IODIXANOL 320 MG/ML IV SOLN
INTRAVENOUS | Status: DC | PRN
  Administered 2021-05-29: 10 mL

## 2021-05-29 MED ORDER — PREGABALIN 50 MG PO CAPS
100.0000 mg | ORAL_CAPSULE | Freq: Three times a day (TID) | ORAL | Status: DC
  Administered 2021-05-29 – 2021-05-31 (×8): 100 mg via ORAL
  Filled 2021-05-29 (×8): qty 2

## 2021-05-29 MED ORDER — MORPHINE SULFATE (PF) 4 MG/ML IV SOLN
2.0000 mg | INTRAVENOUS | Status: DC | PRN
  Administered 2021-05-29: 2 mg via INTRAVENOUS

## 2021-05-29 MED ORDER — MORPHINE SULFATE (PF) 2 MG/ML IV SOLN
2.0000 mg | Freq: Once | INTRAVENOUS | Status: AC
  Administered 2021-05-29: 2 mg via INTRAVENOUS

## 2021-05-29 MED ORDER — SODIUM CHLORIDE 0.9 % IV SOLN: INTRAVENOUS | Status: DC

## 2021-05-29 MED ORDER — HYDRALAZINE HCL 20 MG/ML IJ SOLN
5.0000 mg | INTRAMUSCULAR | Status: DC | PRN
  Administered 2021-05-29: 5 mg via INTRAVENOUS

## 2021-05-29 MED ORDER — HEPARIN (PORCINE) 25000 UT/250ML-% IV SOLN
1000.0000 [IU]/h | INTRAVENOUS | Status: DC
  Administered 2021-05-29: 1150 [IU]/h via INTRAVENOUS
  Administered 2021-05-30 – 2021-05-31 (×2): 1000 [IU]/h via INTRAVENOUS
  Filled 2021-05-29 (×3): qty 250

## 2021-05-29 MED ORDER — NICOTINE 21 MG/24HR TD PT24
21.0000 mg | MEDICATED_PATCH | Freq: Every day | TRANSDERMAL | Status: DC
Start: 2021-05-29 — End: 2021-06-01
  Filled 2021-05-29 (×2): qty 1

## 2021-05-29 MED ORDER — ACETAMINOPHEN 325 MG PO TABS
650.0000 mg | ORAL_TABLET | ORAL | Status: DC | PRN
  Administered 2021-05-31 – 2021-06-01 (×2): 650 mg via ORAL
  Filled 2021-05-29 (×2): qty 2

## 2021-05-29 MED ORDER — MIDAZOLAM HCL 2 MG/ML PO SYRP: 8.0000 mg | ORAL_SOLUTION | Freq: Once | ORAL | Status: DC | PRN

## 2021-05-29 MED ORDER — METHYLPREDNISOLONE SODIUM SUCC 125 MG IJ SOLR: 125.0000 mg | Freq: Once | INTRAMUSCULAR | Status: DC | PRN

## 2021-05-29 MED ORDER — SODIUM CHLORIDE 0.9 % IV SOLN: INTRAVENOUS | Status: AC

## 2021-05-29 MED ORDER — NICOTINE POLACRILEX 2 MG MT GUM
4.0000 mg | CHEWING_GUM | OROMUCOSAL | Status: DC | PRN
  Filled 2021-05-29: qty 2

## 2021-05-29 MED ORDER — SODIUM CHLORIDE 0.9% FLUSH
3.0000 mL | Freq: Two times a day (BID) | INTRAVENOUS | Status: DC
  Administered 2021-05-29: 3 mL via INTRAVENOUS

## 2021-05-29 MED ORDER — SODIUM CHLORIDE 0.9% FLUSH: 3.0000 mL | INTRAVENOUS | Status: DC | PRN

## 2021-05-29 MED ORDER — IOHEXOL 350 MG/ML SOLN
100.0000 mL | Freq: Once | INTRAVENOUS | Status: AC | PRN
  Administered 2021-05-29: 100 mL via INTRAVENOUS

## 2021-05-29 MED ORDER — MIDAZOLAM HCL 2 MG/2ML IJ SOLN
INTRAMUSCULAR | Status: DC | PRN
  Administered 2021-05-29: 2 mg via INTRAVENOUS
  Administered 2021-05-29: 1 mg via INTRAVENOUS

## 2021-05-29 MED ORDER — MORPHINE SULFATE (PF) 4 MG/ML IV SOLN
2.0000 mg | INTRAVENOUS | Status: DC | PRN
  Administered 2021-05-29 – 2021-05-31 (×8): 4 mg via INTRAVENOUS
  Filled 2021-05-29 (×8): qty 1

## 2021-05-29 MED ORDER — FAMOTIDINE 20 MG PO TABS: 40.0000 mg | ORAL_TABLET | Freq: Once | ORAL | Status: DC | PRN

## 2021-05-29 MED ORDER — CEFAZOLIN SODIUM-DEXTROSE 2-4 GM/100ML-% IV SOLN
2.0000 g | Freq: Once | INTRAVENOUS | Status: AC
  Administered 2021-05-29: 2 g via INTRAVENOUS

## 2021-05-29 MED ORDER — OXYCODONE HCL 5 MG PO TABS: 5.0000 mg | ORAL_TABLET | ORAL | Status: DC | PRN

## 2021-05-29 MED ORDER — HEPARIN BOLUS VIA INFUSION
4500.0000 [IU] | Freq: Once | INTRAVENOUS | Status: AC
  Administered 2021-05-29: 4500 [IU] via INTRAVENOUS
  Filled 2021-05-29: qty 4500

## 2021-05-29 MED ORDER — ONDANSETRON HCL 4 MG/2ML IJ SOLN
4.0000 mg | Freq: Four times a day (QID) | INTRAMUSCULAR | Status: DC | PRN
  Administered 2021-05-29 – 2021-05-30 (×2): 4 mg via INTRAVENOUS
  Filled 2021-05-29 (×2): qty 2

## 2021-05-29 MED ORDER — AMLODIPINE BESYLATE 5 MG PO TABS
5.0000 mg | ORAL_TABLET | Freq: Every morning | ORAL | Status: DC
  Administered 2021-05-30 – 2021-06-01 (×3): 5 mg via ORAL
  Filled 2021-05-29 (×3): qty 1

## 2021-05-29 MED ORDER — HYDRALAZINE HCL 20 MG/ML IJ SOLN: 5.0000 mg | INTRAMUSCULAR | Status: DC | PRN

## 2021-05-29 MED ORDER — LABETALOL HCL 5 MG/ML IV SOLN: 10.0000 mg | INTRAVENOUS | Status: DC | PRN

## 2021-05-29 MED ORDER — LABETALOL HCL 5 MG/ML IV SOLN
INTRAVENOUS | Status: DC | PRN
  Administered 2021-05-29 (×2): 10 mg via INTRAVENOUS

## 2021-05-29 MED ORDER — GABAPENTIN 300 MG PO CAPS
600.0000 mg | ORAL_CAPSULE | Freq: Two times a day (BID) | ORAL | Status: DC
  Administered 2021-05-29 – 2021-05-31 (×5): 600 mg via ORAL
  Filled 2021-05-29 (×5): qty 2

## 2021-05-29 SURGICAL SUPPLY — 11 items
CANNULA 5F STIFF (CANNULA) ×2 IMPLANT
CATH ANGIO 5F PIGTAIL 65CM (CATHETERS) ×2 IMPLANT
COVER PROBE U/S 5X48 (MISCELLANEOUS) ×2 IMPLANT
GAUZE SPONGE 4X4 12PLY STRL (GAUZE/BANDAGES/DRESSINGS) ×4 IMPLANT
NEEDLE ENTRY 21GA 7CM ECHOTIP (NEEDLE) ×2 IMPLANT
PACK ANGIOGRAPHY (CUSTOM PROCEDURE TRAY) ×2 IMPLANT
SET INTRO CAPELLA COAXIAL (SET/KITS/TRAYS/PACK) ×2 IMPLANT
SHEATH BRITE TIP 5FRX11 (SHEATH) ×2 IMPLANT
SYR MEDRAD MARK 7 150ML (SYRINGE) ×2 IMPLANT
TUBING CONTRAST HIGH PRESS 72 (TUBING) ×2 IMPLANT
WIRE GUIDERIGHT .035X150 (WIRE) ×2 IMPLANT

## 2021-05-29 NOTE — Consult Note (Addendum)
[facility] Cardiology Consultation Note  Patient ID: Lynix Pitoniak., MRN: [id number], DOB/AGE: 1981/03/29 40 y.o. Admit date: 05/29/2021   Date of Consult: 05/29/2021 Primary Physician: [facility] Primary Cardiologist: Dr. Arnoldo Hooker  Chief Complaint: No chief complaint on file.  Reason for Consult:  Medical optimization prior to aortobifemoral bypass with Dr. Gilda Crease  HPI: 40 y.o. male with a past medical history of bilateral peripheral vascular disease, hypertension, dyslipidemia, tobacco use presented to [facility] for a CT angiogram of terminal aorta with iliofemoral runoff with Dr. Gilda Crease and was found to have severe aortoiliac occlusive disease with complete occlusion of the aorta, and other significant findings that have worsened rapidly in the past few weeks.  Cardiology was consulted for medical optimization prior to aortobifemoral bypass with Dr. Gilda Crease.  He has 2 family members at bedside during interview. patient states he is doing okay and complains of swelling and pain in both feet and swelling on his left foot.  He denies any chest pain, shortness of breath.  He continues to endorse tobacco smoking.  Outpatient EKG 03/2021 showed normal sinus rhythm with a rate of 82 nonspecific T wave changes.  Patient has never had an echocardiogram or prior cardiac catheterization.  Patient's blood pressure is controlled at 118/70 pulse of 98.  Kidney function stable with creatinine of 0.83.   Past Medical History:  Diagnosis Date   Anxiety    Atherosclerotic PVD with intermittent claudication (HCC)    Back pain    Collagen vascular disease (HCC)    Dyspnea    Dysrhythmia    Elevated lipids    Hypertension    Nausea    Seropositive rheumatoid arthritis (HCC)       Surgical History:  Past Surgical History:  Procedure Laterality Date   COLONOSCOPY WITH PROPOFOL N/A 04/21/2018   Procedure: COLONOSCOPY WITH PROPOFOL;  Surgeon: [name], MD;  Location:  [facility] ENDOSCOPY;  Service: Gastroenterology;  Laterality: N/A;   ESOPHAGOGASTRODUODENOSCOPY (EGD) WITH PROPOFOL N/A 04/21/2018   Procedure: ESOPHAGOGASTRODUODENOSCOPY (EGD) WITH PROPOFOL;  Surgeon: [name], MD;  Location: [facility] ENDOSCOPY;  Service: Gastroenterology;  Laterality: N/A;   HIP PINNING Right    TOTAL HIP ARTHROPLASTY Right 10/22/2016   Procedure: TOTAL HIP ARTHROPLASTY ANTERIOR APPROACH;  Surgeon: Kennedy Bucker, MD;  Location: [facility] ORS;  Service: Orthopedics;  Laterality: Right;     Home Meds: Prior to Admission medications   Medication Sig Start Date End Date Taking? Authorizing Provider  acetaminophen (TYLENOL) 500 MG tablet Take 1,000 mg by mouth every 6 (six) hours as needed (pain.).   Yes [provider]  amLODipine (NORVASC) 5 MG tablet Take 5 mg by mouth in the morning.   Yes [provider]  cyclobenzaprine (FLEXERIL) 10 MG tablet Take 10 mg by mouth every 8 (eight) hours as needed for muscle spasms. 05/24/21  Yes [provider]  HYDROcodone-acetaminophen (NORCO/VICODIN) 5-325 MG tablet Take 1 tablet by mouth every 6 (six) hours as needed (pain.). 05/21/21  Yes [provider]  lisinopril (ZESTRIL) 20 MG tablet Take 20 mg by mouth daily.   Yes [provider]  LYRICA 100 MG capsule Take 100 mg by mouth 3 (three) times daily. 05/21/21  Yes [provider]  meloxicam (MOBIC) 15 MG tablet Take 15 mg by mouth in the morning.   Yes [provider]  NEURONTIN 300 MG capsule Take 600 mg by mouth 2 (two) times daily. 04/05/21  Yes [provider]  nicotine (NICODERM CQ -  DOSED IN MG/24 HOURS) 21 mg/24hr patch Place 21 mg onto the skin daily.   Yes [provider]  nicotine polacrilex (NICORETTE) 4 MG gum Take 4 mg by mouth as needed for smoking cessation.   Yes [provider]  Vitamin D, Ergocalciferol, (DRISDOL) 50000 units CAPS capsule Take 50,000 Units by mouth every Thursday.   Yes [provider]    Inpatient Medications:   [START ON 05/30/2021] amLODipine  5 mg Oral q AM   gabapentin  600 mg Oral BID   lisinopril  20 mg Oral Daily   nicotine  21 mg Transdermal Daily   pregabalin  100 mg Oral TID   sodium chloride flush  3 mL Intravenous Q12H    sodium chloride      Allergies: No Known Allergies  Social History   Socioeconomic History   Marital status: Married    Spouse name: Not on file   Number of children: Not on file   Years of education: Not on file   Highest education level: Not on file  Occupational History   Not on file  Tobacco Use   Smoking status: Every Day    Packs/day: 0.25    Years: 16.00    Pack years: 4.00    Types: Cigarettes   Smokeless tobacco: Never  Substance and Sexual Activity   Alcohol use: No   Drug use: Not Currently   Sexual activity: Not on file  Other Topics Concern   Not on file  Social History Narrative   Not on file   Social Determinants of Health   Financial Resource Strain: Not on file  Food Insecurity: Not on file  Transportation Needs: Not on file  Physical Activity: Not on file  Stress: Not on file  Social Connections: Not on file  Intimate Partner Violence: Not on file     No family history on file.   Review of Systems Positive for bilateral lower extremity claudication, left ankle swelling, left foot blister Negative for: General:  chills, fever, night sweats or weight changes.  Cardiovascular: PND orthopnea syncope dizziness  Dermatological: rashes Respiratory: Cough congestion Urologic: Frequent urination urination at night and hematuria Abdominal: negative for nausea, vomiting, diarrhea, bright red blood per rectum, melena, or hematemesis Neurologic: negative for visual changes, and/or hearing changes  All other systems reviewed and are otherwise negative except as noted above.  Labs: No results for input(s): CKTOTAL, CKMB, TROPONINI in the last 72 hours. Lab Results  Component Value  Date   WBC 13.2 (H) 04/09/2021   HGB 13.9 04/09/2021   HCT 41.8 04/09/2021   MCV 88.9 04/09/2021   PLT 195 04/09/2021    Recent Labs  Lab 05/29/21 0714  BUN 27*  CREATININE 0.83   No results found for: CHOL, HDL, LDLCALC, TRIG No results found for: DDIMER  Radiology/Studies:  CT ANGIO AO+BIFEM W & OR WO CONTRAST  Result Date: 05/29/2021 CLINICAL DATA:  Intermittent lower extremity pain. Evaluate for peripheral arterial disease. EXAM: CT ANGIOGRAPHY OF ABDOMINAL AORTA WITH ILIOFEMORAL RUNOFF TECHNIQUE: Multidetector CT imaging of the abdomen, pelvis and lower extremities was performed using the standard protocol during bolus administration of intravenous contrast. Multiplanar CT image reconstructions and MIPs were obtained to evaluate the vascular anatomy. CONTRAST:  140mL OMNIPAQUE IOHEXOL 350 MG/ML SOLN COMPARISON:  CT abdomen/pelvis 04/14/2018 FINDINGS: VASCULAR Aorta: Heterogeneous atherosclerotic plaque, predominantly fibrofatty visualized in the supra visceral abdominal aorta. Heavy fibrofatty atherosclerotic plaque versus mural thrombus in the infrarenal aorta resulting in complete  occlusion. There is likely an element of thrombus extending from the inferior aspect of the occlusion into the left common iliac artery. Celiac: Patent without evidence of aneurysm, dissection, vasculitis or significant stenosis. SMA: Patent without evidence of aneurysm, dissection, vasculitis or significant stenosis. Renals: Both renal arteries are patent without evidence of aneurysm, dissection, vasculitis, fibromuscular dysplasia or significant stenosis. IMA: Occluded at the origin. The distal aspect reconstitutes via collateral flow from the SMA. RIGHT Lower Extremity Inflow: Plaque extends into the common iliac artery resulting in approximately 50% narrowing in the proximal and mid segment. The internal iliac artery is occluded at the origin. The external iliac artery is also diffusely diseased with limited  flow. Likely high-grade stenosis distally just proximal to the inguinal ligament. Outflow: Small caliber common femoral artery secondary to proximal occlusive disease. The profunda and superficial femoral arteries remain patent. Mild atherosclerotic plaque results in mild stenosis at Hunter's canal. The popliteal artery is mildly diseased but patent. Runoff: Multifocal runoff disease with proximal occlusion of the anterior tibial artery. Probable 2 vessel runoff to the ankle. LEFT Lower Extremity Inflow: Atherosclerotic plaque and thrombus result in high-grade stenosis of the left common iliac artery. Chronic occlusion of the internal iliac artery. Focal occlusion of the proximal external iliac artery. Outflow: Small caliber common femoral artery secondary to proximal occlusive disease. The profunda and superficial femoral arteries are patent. Scattered plaque but difficult to assess for significant stenosis given the overall small caliber of the vessel. Suspect high-grade stenosis as the vessel exits the Hunter's canal. The popliteal artery is diseased but patent. Runoff: Multifocal runoff disease. Proximal occlusion of the anterior tibial artery. Patent 2 vessel runoff to the ankle. Veins: No focal venous abnormality. Review of the MIP images confirms the above findings. NON-VASCULAR Lower chest: Dependent atelectasis.  No acute abnormality. Hepatobiliary: No focal liver abnormality is seen. No gallstones, gallbladder wall thickening, or biliary dilatation. Pancreas: Unremarkable. No pancreatic ductal dilatation or surrounding inflammatory changes. Spleen: Normal in size without focal abnormality. Adrenals/Urinary Tract: Normal adrenal glands. No hydronephrosis or nephrolithiasis. Circumscribed 1.8 cm simple cyst in the upper pole of the left kidney. No enhancing renal mass. Stomach/Bowel: No evidence of obstruction or focal bowel wall thickening. Normal appendix in the right lower quadrant. The terminal ileum is  unremarkable. Lymphatic: No suspicious lymphadenopathy. Reproductive: Prostate is unremarkable. Other: No abdominal wall hernia or abnormality. No abdominopelvic ascites. Musculoskeletal: No acute or significant osseous findings. Surgical changes of prior right hip arthroplasty. IMPRESSION: VASCULAR 1. 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 2. Focal high-grade stenosis of the left superficial femoral artery in the region of Hunter's canal. 3. Multifocal runoff disease bilaterally with chronic occlusion of the anterior tibial arteries. 4. No evidence of aneurysm or dissection. NON-VASCULAR 1. No acute abnormality within the abdomen or pelvis. 2. Ancillary findings as above. Aortic Atherosclerosis (ICD10-I70.0). Electronically Signed   By: Jacqulynn Cadet M.D.   On: 05/29/2021 10:38   PERIPHERAL VASCULAR CATHETERIZATION  Result Date: 05/29/2021 See surgical note for result.  VAS [place] LOWER EXTREMITY ARTERIAL DUPLEX  Result Date: 05/10/2021 LOWER EXTREMITY ARTERIAL DUPLEX STUDY Patient Name:  Domnick Itkin.  Date of Exam:   05/10/2021 Medical Rec #: [id number]          Accession #:    [id number] Date of Birth: 1981-01-08          Patient Gender: M Patient Age:   68 years  Exam Location:  [facility] Procedure:      VAS [place] LOWER EXTREMITY ARTERIAL DUPLEX Referring Phys: Serafina Royals --------------------------------------------------------------------------------  Indications: Rest pain.  Current ABI: Not Obtained Performing Technologist: Almira Coaster RVS  Examination Guidelines: A complete evaluation includes B-mode imaging, spectral Doppler, color Doppler, and power Doppler as needed of all accessible portions of each vessel. Bilateral testing is considered an integral part of a complete examination. Limited examinations for reoccurring indications may be performed as  noted.   +-----------+--------+-----+--------+-------------+--------+ LEFT       PSV cm/sRatioStenosisWaveform     Comments +-----------+--------+-----+--------+-------------+--------+ CFA Distal 49                   monophasic            +-----------+--------+-----+--------+-------------+--------+ DFA        35                   monophasic            +-----------+--------+-----+--------+-------------+--------+ SFA Prox   54                   monophasic            +-----------+--------+-----+--------+-------------+--------+ SFA Mid    39                   monophasic            +-----------+--------+-----+--------+-------------+--------+ SFA Distal 81                   monophasic            +-----------+--------+-----+--------+-------------+--------+ POP Distal 33                   monophasic            +-----------+--------+-----+--------+-------------+--------+ ATA Distal 0                    Absent                +-----------+--------+-----+--------+-------------+--------+ PTA Distal 11                   Non Pulsatile         +-----------+--------+-----+--------+-------------+--------+ PERO Distal9                    Non pulsatile         +-----------+--------+-----+--------+-------------+--------+  Summary: Left: Imaging and Waveforms obtained throughout in the Left Lower Extremity. Monophasic Waveforms obtained throughout in Arteries in the Left Lower Extremity. No flow seen in the Left ATA, Non pulsatile flow seen in the ATA/Peroneal Artery.  See table(s) above for measurements and observations. Electronically signed by [name] MD on 05/10/2021 at 4:38:40 PM.    Final    VAS [place] LOWER EXTREMITY VENOUS REFLUX  Result Date: 05/10/2021  Lower Venous Reflux Study Patient Name:  Shaphan Kendrick.  Date of Exam:   05/10/2021 Medical Rec #: [id number]          Accession #:    [id number] Date of Birth: 22-Sep-1980          Patient Gender: M  Patient Age:   45 years Exam Location:  [facility] Procedure:      VAS [place] LOWER EXTREMITY VENOUS REFLUX Referring Phys: Serafina Royals --------------------------------------------------------------------------------  Indications: Pain, and Swelling.  Performing Technologist: Almira Coaster RVS  Examination Guidelines: A complete evaluation includes B-mode imaging,  spectral Doppler, color Doppler, and power Doppler as needed of all accessible portions of each vessel. Bilateral testing is considered an integral part of a complete examination. Limited examinations for reoccurring indications may be performed as noted. The reflux portion of the exam is performed with the patient in reverse Trendelenburg. Significant venous reflux is defined as >500 ms in the superficial venous system, and >1 second in the deep venous system.  Venous Reflux Times +--------------+---------+------+-----------+------------+--------+ RIGHT         Reflux NoRefluxReflux TimeDiameter cmsComments                         Yes                                  +--------------+---------+------+-----------+------------+--------+ CFV           no                                             +--------------+---------+------+-----------+------------+--------+ FV prox       no                                             +--------------+---------+------+-----------+------------+--------+ FV mid        no                                             +--------------+---------+------+-----------+------------+--------+ FV dist       no                                             +--------------+---------+------+-----------+------------+--------+ Popliteal     no                                             +--------------+---------+------+-----------+------------+--------+ GSV at SFJ    no                            .47               +--------------+---------+------+-----------+------------+--------+ GSV prox thigh          yes    3073 ms      .40              +--------------+---------+------+-----------+------------+--------+ GSV mid thigh no                            .40              +--------------+---------+------+-----------+------------+--------+ GSV dist thighno                            .35              +--------------+---------+------+-----------+------------+--------+ GSV  at knee   no                            .36              +--------------+---------+------+-----------+------------+--------+ GSV prox calf no                            .33              +--------------+---------+------+-----------+------------+--------+ SSV Pop Fossa no                            .29              +--------------+---------+------+-----------+------------+--------+  +--------------+---------+------+-----------+------------+--------+ LEFT          Reflux NoRefluxReflux TimeDiameter cmsComments                         Yes                                  +--------------+---------+------+-----------+------------+--------+ CFV           no                                             +--------------+---------+------+-----------+------------+--------+ FV prox       no                                             +--------------+---------+------+-----------+------------+--------+ FV mid        no                                             +--------------+---------+------+-----------+------------+--------+ FV dist       no                                             +--------------+---------+------+-----------+------------+--------+ Popliteal     no                                             +--------------+---------+------+-----------+------------+--------+ GSV at SFJ    no                            .36               +--------------+---------+------+-----------+------------+--------+ GSV prox thighno                            .54              +--------------+---------+------+-----------+------------+--------+ GSV mid thigh no                            .  36              +--------------+---------+------+-----------+------------+--------+ GSV dist thighno                            .45              +--------------+---------+------+-----------+------------+--------+ GSV at knee   no                            .43              +--------------+---------+------+-----------+------------+--------+ GSV prox calf no                            .38              +--------------+---------+------+-----------+------------+--------+   Summary: Bilateral: - No evidence of deep vein thrombosis seen in the lower extremities, bilaterally, from the common femoral through the popliteal veins. - No evidence of superficial venous thrombosis in the lower extremities, bilaterally. - No evidence of deep venous insufficiency seen bilaterally in the lower extremity. - No evidence of superficial venous reflux seen in the short saphenous veins bilaterally.  Right: - Venous reflux is noted in the right greater saphenous vein in the thigh.  - Incidental finding: The Right CFA displays Monophasic Flow.  Left: - No evidence of superficial venous reflux seen in the left greater saphenous vein.  *See table(s) above for measurements and observations. Electronically signed by [name] MD on 05/10/2021 at 4:38:28 PM.    Final     EKG: EKG 03/2021 showed normal sinus rhythm with a rate of 82 nonspecific T wave changes.  Weights: Filed Weights   05/29/21 0728  Weight: 72.6 kg     Physical Exam: Blood pressure 118/70, pulse 98, temperature 98.4 F (36.9 C), temperature source Oral, resp. rate 19, height 5\' 8"  (1.727 m), weight 72.6 kg, SpO2 98 %. Body mass index is 24.33 kg/m. General: Pleasant black male, well  developed, well nourished, in no acute distress. Examined sitting upright and legs dangling off bed.  Head eyes ears nose throat: Normocephalic, atraumatic, sclera non-icteric, no xanthomas. No apparent thyromegaly and/or mass  Lungs: Normal respiratory effort.  no wheezes, no rales, no rhonchi.  Heart: RRR with normal S1 S2. no murmur gallop, no rub Abdomen: Soft, non-distended. No apparent hepatomegaly. No obvious abdominal masses.  Extremities: Bilateral feet are cool to touch. LEFT leg with 1+ edema. LEFT foot quarter sized blister. RIGHT leg without significant edema. No cyanosis, no clubbing, no ulcers  Peripheral : 2+ bilateral upper extremity pulses, absent bilateral dorsal pedal pulse Neuro: Alert and oriented. No facial asymmetry. No focal deficit. Moves all extremities spontaneously. Musculoskeletal: Normal muscle tone without kyphosis Psych:  Responds to questions appropriately with a normal affect.    Assessment: 40 year old black male with a past medical history of bilateral peripheral vascular disease, hypertension, dyslipidemia, tobacco use presented to [facility] for a CT angiogram of terminal aorta with iliofemoral runoff with Dr. [facility] and was found to have severe aortoiliac occlusive disease with complete occlusion of the aorta, and other significant findings that have worsened rapidly in the past few weeks.  Cardiology was consulted for medical optimization prior to aortobifemoral bypass with Dr. Gilda Crease.  Plan: #Atherosclerotic peripheral vascular disease with claudication -Ordered Lexiscan and echocardiogram prior to surgery with Dr. Gilda Crease as patient has risk factors  for coronary disease   #Hypertension -continue amlodipine and lisinopril   #Hyperlipidemia -pt declined statin at prior office visit, will continue to recommend statin therapy   #tobacco abuse -strongly encouraged cessation   Signed, Lina Sayre, PA-C [facility] Cardiology 05/29/2021, 2:34 PM   The patient has been interviewed and examined. I agree with assessment and plan above. Serafina Royals MD [facility]

## 2021-05-29 NOTE — Progress Notes (Signed)
Patient out of lab post procedure. States his left foot "is on fire." Pain medication given per order. Blood pressure remains elevated and 10 mg Labetalol given. Patient tolerated IV meds well without incident. Patient to go to CT for CTA per MD order.

## 2021-05-29 NOTE — Progress Notes (Signed)
Patient arrived to unit transported by PACU nurse. Bedside report received. Patient has pain 7/10. PRN pain medication given. Patient resting in chair with normal saline at 41ml/hr. Wife and mother at bedside.

## 2021-05-29 NOTE — Interval H&P Note (Signed)
History and Physical Interval Note:  05/29/2021 8:14 AM  Jeffrey Grant.  has presented today for surgery, with the diagnosis of Aorta Iliac Angiography   ASO with rest pain   Crosser Case   BARD Rep cc: Loni Muse.  The various methods of treatment have been discussed with the patient and family. After consideration of risks, benefits and other options for treatment, the patient has consented to  Procedure(s): LOWER EXTREMITY ANGIOGRAPHY (N/A) as a surgical intervention.  The patient's history has been reviewed, patient examined, no change in status, stable for surgery.  I have reviewed the patient's chart and labs.  Questions were answered to the patient's satisfaction.     [name]

## 2021-05-29 NOTE — Op Note (Signed)
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  Contrast injection right iliac arteries and common femoral artery  2.  Contrast injection left common femoral and external iliac artery  3.  Ultrasound-guided access to the common femoral arteries bilaterally    Anesthesia: Conscious sedation was administered by the interventional radiology RN under my direct supervision. IV Versed plus fentanyl were utilized. Continuous ECG, pulse oximetry and blood pressure was monitored throughout the entire procedure.  Conscious sedation was administered for a total of 26 minutes.  Sheath: 5 French micro sheath right common femoral artery retrograde; 5 French micro sheath left common femoral artery retrograde  Contrast: 10 cc   Fluoroscopy Time: 0.4 minutes  Indications:  The patient presents to [facility] with rest pain of both lower extremities and a new blister of the left foot.  Pedal pulses are nonpalpable bilaterally suggesting atherosclerotic occlusive disease.  The risks and benefits as well as alternative therapies for lower extremity revascularization are reviewed with the patient all questions are answered the patient agrees to proceed.  The patient is therefore undergoing angiography with the hope for intervention for limb salvage.   Procedure:  Jeffrey Grantis a 40 y.o. male who was identified and appropriate procedural time out was performed.  The patient was then placed supine on the table and prepped and draped in the usual sterile fashion.  Ultrasound was used to evaluate the right common femoral artery.  It was echolucent and pulsatile indicating it is patent .  An ultrasound image was acquired  for the permanent record.  A micropuncture needle was used to access the right common femoral artery under direct ultrasound guidance.  The microwire was then advanced under fluoroscopic guidance but met some resistance proximally (this is somewhat expected given his noninvasive findings and lack of femoral pulses aortoiliac inflow stenosis is expected).  A 5 [place] micro-sheath is then inserted..  A 0.035 J wire was advanced but would not go beyond the mid iliac and therefore elected to remove the wire and perform hand-injection of contrast through the micro sheath.  This demonstrated greater than 90% stenosis within the external iliac.  The distal two thirds of the common iliac on the right appear to be patent but then the proximal common iliac on the right as well as the aorta appears to be occluded.  Based on this I elected to access the left groin to see whether better imaging of the aorta could be obtained.  Again the ultrasound was delivered onto the field the common femoral artery was imaged it was echolucent indicating patency.  1% lidocaine is infiltrated soft tissue and images recorded for the permanent record and under direct ultrasound visualization a microneedle was inserted into the left common femoral artery.  The microwire was then advanced again this met some resistance but I was able to get purchase to place the micro sheath.  Hand-injection through the micro sheath demonstrated complete occlusion of the external iliac artery there is nonvisualization of the common iliac artery on the left there is complete nonvisualization of the aorta.  No significant collaterals are noted.  Based on these images the micro sheaths were pulled and direct pressure was held.  Under the circumstances I believe it is most appropriate to obtain a CT  angio of the aorta iliac system with bilateral runoff.  Given his age of 51 and the profound aortoiliac disease aortobifemoral bypass may be a more appropriate plan of  action.  Once the CT has been obtained we will discuss this with the patient.   Findings:   Aortogram: See above essentially there is complete occlusion of the aorta and left common and external iliac.  The proximal right common iliac is occluded the right external iliac appears to have a greater than 90% stenosis in its proximal half.    Disposition: Patient was taken to the recovery room in stable condition having tolerated the procedure well.  Jeffrey Grant 05/29/2021,8:59 AM

## 2021-05-29 NOTE — Progress Notes (Signed)
Agree with Marylu Lund, RN admission assessment. Heparin started.   No significant changes will on unit.  Will report off to oncoming nurse.

## 2021-05-29 NOTE — Consult Note (Signed)
ANTICOAGULATION CONSULT NOTE - Initial Consult  Pharmacy Consult for heparin Indication: PAD  No Known Allergies  Patient Measurements: Height: 5\' 8"  (172.7 cm) Weight: 72.6 kg (160 lb) IBW/kg (Calculated) : 68.4 Heparin Dosing Weight: 72.6 kg   Vital Signs: Temp: 97.6 F (36.4 C) (12/06 1449) Temp Source: Oral (12/06 1449) BP: 129/82 (12/06 1449) Pulse Rate: 101 (12/06 1449)  Labs: Recent Labs    05/29/21 0714  CREATININE 0.83    Estimated Creatinine Clearance: 113.3 mL/min (by C-G formula based on SCr of 0.83 mg/dL).   Medical History: Past Medical History:  Diagnosis Date   Anxiety    Atherosclerotic PVD with intermittent claudication (HCC)    Back pain    Collagen vascular disease (HCC)    Dyspnea    Dysrhythmia    Elevated lipids    Hypertension    Nausea    Seropositive rheumatoid arthritis (HCC)     Medications:  No PTA anticoagulation or antiplatelet therapy   Assessment: 40 y.o. male with a past medical history of bilateral PVD, HTN, HLD, and tobacco use who presents with chief complaint of leg pain. CT showed severe aortoiliac occlusive disease, s/p angiography of the lower extremities. Pharmacy has been consulted for heparin dosing for PAD.  Baseline labs ordered.   Goal of Therapy:  Heparin level 0.3-0.7 units/ml Monitor platelets by anticoagulation protocol: Yes   Plan:  Give 4500 units bolus x 1 Start heparin infusion at 1150 units/hr Check anti-Xa level in 6 hours and daily while on heparin Continue to monitor H&H and platelets  40, PharmD 05/29/2021,3:19 PM

## 2021-05-29 NOTE — Plan of Care (Signed)

## 2021-05-30 ENCOUNTER — Inpatient Hospital Stay: Payer: Medicaid Other

## 2021-05-30 ENCOUNTER — Inpatient Hospital Stay
Admission: RE | Admit: 2021-05-30 | Discharge: 2021-05-30 | Disposition: A | Discharge status: Home / Self Care | Payer: Medicaid Other | Source: Home / Self Care | Attending: Vascular Surgery | Admitting: Vascular Surgery

## 2021-05-30 LAB — CBC
HCT: 38.7 % — ABNORMAL LOW (ref 39.0–52.0)
Hemoglobin: 13 g/dL (ref 13.0–17.0)
MCH: 29.7 pg (ref 26.0–34.0)
MCHC: 33.6 g/dL (ref 30.0–36.0)
MCV: 88.4 fL (ref 80.0–100.0)
Platelets: 258 10*3/uL (ref 150–400)
RBC: 4.38 MIL/uL (ref 4.22–5.81)
RDW: 15.1 % (ref 11.5–15.5)
WBC: 8 10*3/uL (ref 4.0–10.5)
nRBC: 0 % (ref 0.0–0.2)

## 2021-05-30 LAB — ECHOCARDIOGRAM COMPLETE
AR max vel: 2.9 cm2
AV Area VTI: 3.74 cm2
AV Area mean vel: 2.92 cm2
AV Mean grad: 2 mmHg
AV Peak grad: 3.5 mmHg
Ao pk vel: 0.93 m/s
Area-P 1/2: 6.07 cm2
Height: 68 in
MV VTI: 3.52 cm2
S' Lateral: 2.3 cm
Weight: 2560 oz

## 2021-05-30 LAB — HEPARIN LEVEL (UNFRACTIONATED)
Heparin Unfractionated: 0.42 IU/mL (ref 0.30–0.70)
Heparin Unfractionated: 0.69 IU/mL (ref 0.30–0.70)
Heparin Unfractionated: 0.95 IU/mL — ABNORMAL HIGH (ref 0.30–0.70)

## 2021-05-30 LAB — BASIC METABOLIC PANEL
Anion gap: 8 (ref 5–15)
BUN: 17 mg/dL (ref 6–20)
CO2: 27 mmol/L (ref 22–32)
Calcium: 9.5 mg/dL (ref 8.9–10.3)
Chloride: 100 mmol/L (ref 98–111)
Creatinine, Ser: 0.61 mg/dL (ref 0.61–1.24)
GFR, Estimated: >60 mL/min (ref >60)
Glucose, Bld: 116 mg/dL — ABNORMAL HIGH (ref 70–99)
Potassium: 4.7 mmol/L (ref 3.5–5.1)
Sodium: 135 mmol/L (ref 135–145)

## 2021-05-30 MED ORDER — TECHNETIUM TC 99M TETROFOSMIN IV KIT
10.0000 | PACK | Freq: Once | INTRAVENOUS | Status: AC
  Administered 2021-05-30: 10.04 via INTRAVENOUS

## 2021-05-30 MED ORDER — TECHNETIUM TC 99M TETROFOSMIN IV KIT
30.0000 | PACK | Freq: Once | INTRAVENOUS | Status: AC
  Administered 2021-05-30: 31.89 via INTRAVENOUS

## 2021-05-30 MED ORDER — REGADENOSON 0.4 MG/5ML IV SOLN
0.4000 mg | Freq: Once | INTRAVENOUS | Status: AC
Start: 2021-05-30 — End: 2021-05-30
  Administered 2021-05-30: 0.4 mg via INTRAVENOUS

## 2021-05-30 MED ORDER — MAGNESIUM HYDROXIDE 400 MG/5ML PO SUSP
30.0000 mL | Freq: Every day | ORAL | Status: DC | PRN
  Administered 2021-05-31: 30 mL via ORAL
  Filled 2021-05-30: qty 30

## 2021-05-30 MED ORDER — POLYETHYLENE GLYCOL 3350 17 G PO PACK
17.0000 g | PACK | Freq: Every day | ORAL | Status: DC | PRN
  Administered 2021-05-30 – 2021-05-31 (×2): 17 g via ORAL
  Filled 2021-05-30 (×2): qty 1

## 2021-05-30 MED ORDER — BISACODYL 5 MG PO TBEC
5.0000 mg | DELAYED_RELEASE_TABLET | Freq: Every day | ORAL | Status: DC | PRN
  Administered 2021-05-31: 5 mg via ORAL
  Filled 2021-05-30: qty 1

## 2021-05-30 MED ORDER — DOCUSATE SODIUM 100 MG PO CAPS
100.0000 mg | ORAL_CAPSULE | Freq: Two times a day (BID) | ORAL | Status: DC
  Administered 2021-05-30 – 2021-05-31 (×3): 100 mg via ORAL
  Filled 2021-05-30 (×3): qty 1

## 2021-05-30 NOTE — Progress Notes (Signed)
*  PRELIMINARY RESULTS* Echocardiogram 2D Echocardiogram has been performed.  Cristela Blue 05/30/2021, 8:09 AM

## 2021-05-30 NOTE — Progress Notes (Signed)
[facility] Encounter Note  Patient: Jeffrey Grant. / Admit Date: 05/29/2021 / Date of Encounter: 05/30/2021, 11:14 AM   Subjective: Overall patient feels relatively well today with no evidence of chest discomfort or other cardiovascular symptoms.  Patient still does have some lower extremity issues consistent with occluded aorta and poor lower extremity perfusion.  Review of Systems: Positive for: Leg pain Negative for: Vision change, hearing change, syncope, dizziness, nausea, vomiting,diarrhea, bloody stool, stomach pain, cough, congestion, diaphoresis, urinary frequency, urinary pain,skin lesions, skin rashes Others previously listed  Objective: Telemetry: Normal sinus rhythm Physical Exam: Blood pressure 130/89, pulse 98, temperature 97.6 F (36.4 C), temperature source Oral, resp. rate 20, height 5\' 8"  (1.727 m), weight 72.6 kg, SpO2 100 %. Body mass index is 24.33 kg/m. General: Well developed, well nourished, in no acute distress. Head: Normocephalic, atraumatic, sclera non-icteric, no xanthomas, nares are without discharge. Neck: No apparent masses Lungs: Normal respirations with no wheezes, no rhonchi, no rales , no crackles   Heart: Regular rate and rhythm, normal S1 S2, no murmur, no rub, no gallop, PMI is normal size and placement, carotid upstroke normal without bruit, jugular venous pressure normal Abdomen: Soft, non-tender, non-distended with normoactive bowel sounds. No hepatosplenomegaly. Abdominal aorta is normal size without bruit Extremities: Trace edema, no clubbing, no cyanosis, positive ulcers,  Peripheral: 2+ radial, 0+ femoral, 0+ dorsal pedal pulses Neuro: Alert and oriented. Moves all extremities spontaneously. Psych:  Responds to questions appropriately with a normal affect.   Intake/Output Summary (Last 24 hours) at 05/30/2021 1114 Last data filed at 05/30/2021 0421 Gross per 24 hour  Intake 571.62 ml  Output --  Net 571.62 ml     Inpatient Medications:   amLODipine  5 mg Oral q AM   gabapentin  600 mg Oral BID   lisinopril  20 mg Oral Daily   nicotine  21 mg Transdermal Daily   pregabalin  100 mg Oral TID   sodium chloride flush  3 mL Intravenous Q12H   Infusions:   heparin 1,000 Units/hr (05/30/21 0421)    Labs: Recent Labs    05/29/21 0714 05/30/21 0723  NA  --  135  K  --  4.7  CL  --  100  CO2  --  27  GLUCOSE  --  116*  BUN 27* 17  CREATININE 0.83 0.61  CALCIUM  --  9.5   No results for input(s): AST, ALT, ALKPHOS, BILITOT, PROT, ALBUMIN in the last 72 hours. Recent Labs    05/29/21 1607 05/30/21 0723  WBC 7.6 8.0  HGB 13.1 13.0  HCT 40.5 38.7*  MCV 88.8 88.4  PLT 292 258   No results for input(s): CKTOTAL, CKMB, TROPONINI in the last 72 hours. Invalid input(s): POCBNP No results for input(s): HGBA1C in the last 72 hours.   Weights: Filed Weights   05/29/21 0728  Weight: 72.6 kg     Radiology/Studies:  CT ANGIO AO+BIFEM W & OR WO CONTRAST  Result Date: 05/29/2021 CLINICAL DATA:  Intermittent lower extremity pain. Evaluate for peripheral arterial disease. EXAM: CT ANGIOGRAPHY OF ABDOMINAL AORTA WITH ILIOFEMORAL RUNOFF TECHNIQUE: Multidetector CT imaging of the abdomen, pelvis and lower extremities was performed using the standard protocol during bolus administration of intravenous contrast. Multiplanar CT image reconstructions and MIPs were obtained to evaluate the vascular anatomy. CONTRAST:  174mL OMNIPAQUE IOHEXOL 350 MG/ML SOLN COMPARISON:  CT abdomen/pelvis 04/14/2018 FINDINGS: VASCULAR Aorta: Heterogeneous atherosclerotic plaque, predominantly fibrofatty visualized in the supra visceral abdominal  aorta. Heavy fibrofatty atherosclerotic plaque versus mural thrombus in the infrarenal aorta resulting in complete occlusion. There is likely an element of thrombus extending from the inferior aspect of the occlusion into the left common iliac artery. Celiac: Patent without evidence  of aneurysm, dissection, vasculitis or significant stenosis. SMA: Patent without evidence of aneurysm, dissection, vasculitis or significant stenosis. Renals: Both renal arteries are patent without evidence of aneurysm, dissection, vasculitis, fibromuscular dysplasia or significant stenosis. IMA: Occluded at the origin. The distal aspect reconstitutes via collateral flow from the SMA. RIGHT Lower Extremity Inflow: Plaque extends into the common iliac artery resulting in approximately 50% narrowing in the proximal and mid segment. The internal iliac artery is occluded at the origin. The external iliac artery is also diffusely diseased with limited flow. Likely high-grade stenosis distally just proximal to the inguinal ligament. Outflow: Small caliber common femoral artery secondary to proximal occlusive disease. The profunda and superficial femoral arteries remain patent. Mild atherosclerotic plaque results in mild stenosis at Hunter's canal. The popliteal artery is mildly diseased but patent. Runoff: Multifocal runoff disease with proximal occlusion of the anterior tibial artery. Probable 2 vessel runoff to the ankle. LEFT Lower Extremity Inflow: Atherosclerotic plaque and thrombus result in high-grade stenosis of the left common iliac artery. Chronic occlusion of the internal iliac artery. Focal occlusion of the proximal external iliac artery. Outflow: Small caliber common femoral artery secondary to proximal occlusive disease. The profunda and superficial femoral arteries are patent. Scattered plaque but difficult to assess for significant stenosis given the overall small caliber of the vessel. Suspect high-grade stenosis as the vessel exits the Hunter's canal. The popliteal artery is diseased but patent. Runoff: Multifocal runoff disease. Proximal occlusion of the anterior tibial artery. Patent 2 vessel runoff to the ankle. Veins: No focal venous abnormality. Review of the MIP images confirms the above findings.  NON-VASCULAR Lower chest: Dependent atelectasis.  No acute abnormality. Hepatobiliary: No focal liver abnormality is seen. No gallstones, gallbladder wall thickening, or biliary dilatation. Pancreas: Unremarkable. No pancreatic ductal dilatation or surrounding inflammatory changes. Spleen: Normal in size without focal abnormality. Adrenals/Urinary Tract: Normal adrenal glands. No hydronephrosis or nephrolithiasis. Circumscribed 1.8 cm simple cyst in the upper pole of the left kidney. No enhancing renal mass. Stomach/Bowel: No evidence of obstruction or focal bowel wall thickening. Normal appendix in the right lower quadrant. The terminal ileum is unremarkable. Lymphatic: No suspicious lymphadenopathy. Reproductive: Prostate is unremarkable. Other: No abdominal wall hernia or abnormality. No abdominopelvic ascites. Musculoskeletal: No acute or significant osseous findings. Surgical changes of prior right hip arthroplasty. IMPRESSION: VASCULAR 1. 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 2. Focal high-grade stenosis of the left superficial femoral artery in the region of Hunter's canal. 3. Multifocal runoff disease bilaterally with chronic occlusion of the anterior tibial arteries. 4. No evidence of aneurysm or dissection. NON-VASCULAR 1. No acute abnormality within the abdomen or pelvis. 2. Ancillary findings as above. Aortic Atherosclerosis (ICD10-I70.0). Electronically Signed   By: Jacqulynn Cadet M.D.   On: 05/29/2021 10:38   PERIPHERAL VASCULAR CATHETERIZATION  Result Date: 05/29/2021 See surgical note for result.  VAS [place] LOWER EXTREMITY ARTERIAL DUPLEX  Result Date: 05/10/2021 LOWER EXTREMITY ARTERIAL DUPLEX STUDY Patient Name:  Jaimy Habecker.  Date of Exam:   05/10/2021 Medical Rec #: [id number]          Accession #:    [id number] Date of Birth: 1980-12-17  Patient Gender: M  Patient Age:   13 years Exam Location:  [facility] Procedure:      VAS [place] LOWER EXTREMITY ARTERIAL DUPLEX Referring Phys: Arnoldo Hooker --------------------------------------------------------------------------------  Indications: Rest pain.  Current ABI: Not Obtained Performing Technologist: Debbe Bales RVS  Examination Guidelines: A complete evaluation includes B-mode imaging, spectral Doppler, color Doppler, and power Doppler as needed of all accessible portions of each vessel. Bilateral testing is considered an integral part of a complete examination. Limited examinations for reoccurring indications may be performed as noted.   +-----------+--------+-----+--------+-------------+--------+ LEFT       PSV cm/sRatioStenosisWaveform     Comments +-----------+--------+-----+--------+-------------+--------+ CFA Distal 49                   monophasic            +-----------+--------+-----+--------+-------------+--------+ DFA        35                   monophasic            +-----------+--------+-----+--------+-------------+--------+ SFA Prox   54                   monophasic            +-----------+--------+-----+--------+-------------+--------+ SFA Mid    39                   monophasic            +-----------+--------+-----+--------+-------------+--------+ SFA Distal 81                   monophasic            +-----------+--------+-----+--------+-------------+--------+ POP Distal 33                   monophasic            +-----------+--------+-----+--------+-------------+--------+ ATA Distal 0                    Absent                +-----------+--------+-----+--------+-------------+--------+ PTA Distal 11                   Non Pulsatile         +-----------+--------+-----+--------+-------------+--------+ PERO Distal9                    Non pulsatile         +-----------+--------+-----+--------+-------------+--------+  Summary:  Left: Imaging and Waveforms obtained throughout in the Left Lower Extremity. Monophasic Waveforms obtained throughout in Arteries in the Left Lower Extremity. No flow seen in the Left ATA, Non pulsatile flow seen in the ATA/Peroneal Artery.  See table(s) above for measurements and observations. Electronically signed by [name] MD on 05/10/2021 at 4:38:40 PM.    Final    VAS [place] LOWER EXTREMITY VENOUS REFLUX  Result Date: 05/10/2021  Lower Venous Reflux Study Patient Name:  Cobin Cadavid.  Date of Exam:   05/10/2021 Medical Rec #: [id number]          Accession #:    [id number] Date of Birth: [birth year]          Patient Gender: M Patient Age:   45 years Exam Location:  [facility] Procedure:      VAS [place] LOWER EXTREMITY VENOUS REFLUX Referring Phys: Arnoldo Hooker --------------------------------------------------------------------------------  Indications: Pain, and Swelling.  Performing Technologist: Debbe Bales RVS  Examination Guidelines: A complete evaluation includes B-mode imaging, spectral Doppler, color Doppler, and power Doppler as needed of all accessible portions of each vessel. Bilateral testing is considered an integral part of a complete examination. Limited examinations for reoccurring indications may be performed as noted. The reflux portion of the exam is performed with the patient in reverse Trendelenburg. Significant venous reflux is defined as >500 ms in the superficial venous system, and >1 second in the deep venous system.  Venous Reflux Times +--------------+---------+------+-----------+------------+--------+ RIGHT         Reflux NoRefluxReflux TimeDiameter cmsComments                         Yes                                  +--------------+---------+------+-----------+------------+--------+ CFV           no                                             +--------------+---------+------+-----------+------------+--------+ FV prox       no                                              +--------------+---------+------+-----------+------------+--------+ FV mid        no                                             +--------------+---------+------+-----------+------------+--------+ FV dist       no                                             +--------------+---------+------+-----------+------------+--------+ Popliteal     no                                             +--------------+---------+------+-----------+------------+--------+ GSV at SFJ    no                            .47              +--------------+---------+------+-----------+------------+--------+ GSV prox thigh          yes    3073 ms      .40              +--------------+---------+------+-----------+------------+--------+ GSV mid thigh no                            .40              +--------------+---------+------+-----------+------------+--------+ GSV dist thighno                            .35              +--------------+---------+------+-----------+------------+--------+  GSV at knee   no                            .36              +--------------+---------+------+-----------+------------+--------+ GSV prox calf no                            .33              +--------------+---------+------+-----------+------------+--------+ SSV Pop Fossa no                            .29              +--------------+---------+------+-----------+------------+--------+  +--------------+---------+------+-----------+------------+--------+ LEFT          Reflux NoRefluxReflux TimeDiameter cmsComments                         Yes                                  +--------------+---------+------+-----------+------------+--------+ CFV           no                                             +--------------+---------+------+-----------+------------+--------+ FV prox       no                                              +--------------+---------+------+-----------+------------+--------+ FV mid        no                                             +--------------+---------+------+-----------+------------+--------+ FV dist       no                                             +--------------+---------+------+-----------+------------+--------+ Popliteal     no                                             +--------------+---------+------+-----------+------------+--------+ GSV at SFJ    no                            .36              +--------------+---------+------+-----------+------------+--------+ GSV prox thighno                            .54              +--------------+---------+------+-----------+------------+--------+ GSV mid thigh no                            .  36              +--------------+---------+------+-----------+------------+--------+ GSV dist thighno                            .45              +--------------+---------+------+-----------+------------+--------+ GSV at knee   no                            .43              +--------------+---------+------+-----------+------------+--------+ GSV prox calf no                            .38              +--------------+---------+------+-----------+------------+--------+   Summary: Bilateral: - No evidence of deep vein thrombosis seen in the lower extremities, bilaterally, from the common femoral through the popliteal veins. - No evidence of superficial venous thrombosis in the lower extremities, bilaterally. - No evidence of deep venous insufficiency seen bilaterally in the lower extremity. - No evidence of superficial venous reflux seen in the short saphenous veins bilaterally.  Right: - Venous reflux is noted in the right greater saphenous vein in the thigh.  - Incidental finding: The Right CFA displays Monophasic Flow.  Left: - No evidence of superficial venous reflux seen in the left greater saphenous vein.   *See table(s) above for measurements and observations. Electronically signed by [name] MD on 05/10/2021 at 4:38:28 PM.    Final      Assessment and Recommendation  40 y.o. male with significant tobacco abuse and peripheral vascular disease with occluded aorta and ulceration of lower extremities and no current evidence of cardiovascular symptoms needing further risk stratification 1.  Lexiscan infusion Myoview and echocardiogram for risk stratification 2.  Hypertension control with lisinopril and amlodipine 3.  Further treatment options and medication management changes prior to surgical intervention  Signed, Serafina Royals M.D. FACC

## 2021-05-30 NOTE — Consult Note (Signed)
ANTICOAGULATION CONSULT NOTE - Initial Consult  Pharmacy Consult for heparin Indication: PAD  No Known Allergies  Patient Measurements: Height: 5\' 8"  (172.7 cm) Weight: 72.6 kg (160 lb) IBW/kg (Calculated) : 68.4 Heparin Dosing Weight: 72.6 kg   Vital Signs: Temp: 97.5 F (36.4 C) (12/06 1955) Temp Source: Oral (12/06 1955) BP: 131/81 (12/06 1955) Pulse Rate: 103 (12/06 1955)  Labs: Recent Labs    05/29/21 0714 05/29/21 1607 05/29/21 2351  HGB  --  13.1  --   HCT  --  40.5  --   PLT  --  292  --   APTT  --  37*  --   LABPROT  --  12.7  --   INR  --  1.0  --   HEPARINUNFRC  --   --  0.95*  CREATININE 0.83  --   --      Estimated Creatinine Clearance: 113.3 mL/min (by C-G formula based on SCr of 0.83 mg/dL).   Medical History: Past Medical History:  Diagnosis Date   Anxiety    Atherosclerotic PVD with intermittent claudication (HCC)    Back pain    Collagen vascular disease (HCC)    Dyspnea    Dysrhythmia    Elevated lipids    Hypertension    Nausea    Seropositive rheumatoid arthritis (HCC)     Medications:  No PTA anticoagulation or antiplatelet therapy   Assessment: 40 y.o. male with a past medical history of bilateral PVD, HTN, HLD, and tobacco use who presents with chief complaint of leg pain. CT showed severe aortoiliac occlusive disease, s/p angiography of the lower extremities. Pharmacy has been consulted for heparin dosing for PAD.  Baseline labs ordered.   Goal of Therapy:  Heparin level 0.3-0.7 units/ml Monitor platelets by anticoagulation protocol: Yes   Plan:  12/6:  HL @ 2351 = 0.95, SUPRAtherapeutic Will decrease heparin infusion to 1000 units/hr and recheck HL 6 hrs after rate change.   Camrin Lapre D, PharmD 05/30/2021,1:04 AM

## 2021-05-30 NOTE — Consult Note (Signed)
ANTICOAGULATION CONSULT NOTE  Pharmacy Consult for heparin Indication: PAD  No Known Allergies  Patient Measurements: Height: 5\' 8"  (172.7 cm) Weight: 72.6 kg (160 lb) IBW/kg (Calculated) : 68.4  Vital Signs: Temp: 97.8 F (36.6 C) (12/07 0432) Temp Source: Oral (12/06 1955) BP: 137/96 (12/07 0432) Pulse Rate: 100 (12/07 0432)  Labs: Recent Labs    05/29/21 0714 05/29/21 1607 05/29/21 2351  HGB  --  13.1  --   HCT  --  40.5  --   PLT  --  292  --   APTT  --  37*  --   LABPROT  --  12.7  --   INR  --  1.0  --   HEPARINUNFRC  --   --  0.95*  CREATININE 0.83  --   --      Estimated Creatinine Clearance: 113.3 mL/min (by C-G formula based on SCr of 0.83 mg/dL).   Medical History: Past Medical History:  Diagnosis Date   Anxiety    Atherosclerotic PVD with intermittent claudication (HCC)    Back pain    Collagen vascular disease (HCC)    Dyspnea    Dysrhythmia    Elevated lipids    Hypertension    Nausea    Seropositive rheumatoid arthritis (HCC)     Medications:  No PTA anticoagulation or antiplatelet therapy   Assessment: 40 y.o. male with a past medical history of bilateral PVD, HTN, HLD, and tobacco use who presented with chief complaint of leg pain. CT showed severe aortoiliac occlusive disease, s/p angiography of the lower extremities. Pharmacy has been consulted for heparin dosing for PAD.   Goal of Therapy:  Heparin level 0.3-0.7 units/ml Monitor platelets by anticoagulation protocol: Yes   Plan:  Following previous rate change first level is therapeutic Continue heparin infusion at 1000 units/hr and recheck heparin level in 6 hours to confirm Daily CBC while on IV heparin  40, PharmD 05/30/2021,6:49 AM

## 2021-05-30 NOTE — Consult Note (Signed)
ANTICOAGULATION CONSULT NOTE  Pharmacy Consult for heparin Indication: PAD  No Known Allergies  Patient Measurements: Height: 5\' 8"  (172.7 cm) Weight: 72.6 kg (160 lb) IBW/kg (Calculated) : 68.4  Vital Signs: Temp: 97.8 F (36.6 C) (12/07 0432) Temp Source: Oral (12/06 1955) BP: 137/96 (12/07 0432) Pulse Rate: 100 (12/07 0432)  Labs: Recent Labs    05/29/21 0714 05/29/21 1607 05/29/21 2351 05/30/21 0723  HGB  --  13.1  --   --   HCT  --  40.5  --   --   PLT  --  292  --   --   APTT  --  37*  --   --   LABPROT  --  12.7  --   --   INR  --  1.0  --   --   HEPARINUNFRC  --   --  0.95* 0.69  CREATININE 0.83  --   --  0.61     Estimated Creatinine Clearance: 117.6 mL/min (by C-G formula based on SCr of 0.61 mg/dL).   Medical History: Past Medical History:  Diagnosis Date   Anxiety    Atherosclerotic PVD with intermittent claudication (HCC)    Back pain    Collagen vascular disease (HCC)    Dyspnea    Dysrhythmia    Elevated lipids    Hypertension    Nausea    Seropositive rheumatoid arthritis (HCC)     Medications:  No PTA anticoagulation or antiplatelet therapy   Assessment: 40 y.o. male with a past medical history of bilateral PVD, HTN, HLD, and tobacco use who presented with chief complaint of leg pain. CT showed severe aortoiliac occlusive disease, s/p angiography of the lower extremities. Pharmacy has been consulted for heparin dosing for PAD. H&H, platelets are wnl and stable  Goal of Therapy:  Heparin level 0.3-0.7 units/ml Monitor platelets by anticoagulation protocol: Yes   Plan:  Following previous rate change second consecutive level is therapeutic Continue heparin infusion at 1000 units/hr and recheck next heparin level in am 05/31/21 Daily CBC while on IV heparin  14/08/22, PharmD 05/30/2021,7:53 AM

## 2021-05-30 NOTE — Progress Notes (Signed)
[facility] Daily Progress Note  05/29/21:             1.  Contrast injection right iliac arteries and common femoral artery             2.  Contrast injection left common femoral and external iliac artery             3.  Ultrasound-guided access to the common femoral arteries bilaterally  Subjective: Patient continues to endorse bilateral lower extremity discomfort.  No acute issues overnight.  Objective: Vitals:   05/29/21 1955 05/30/21 0432 05/30/21 0700 05/30/21 1531  BP: 131/81 (!) 137/96 130/89 132/82  Pulse: (!) 103 100 98 (!) 110  Resp: 18 20 20 19   Temp: (!) 97.5 F (36.4 C) 97.8 F (36.6 C) 97.6 F (36.4 C) 98.7 F (37.1 C)  TempSrc: Oral  Oral Oral  SpO2: 100% 100% 100% 99%  Weight:      Height:        Intake/Output Summary (Last 24 hours) at 05/30/2021 1928 Last data filed at 05/30/2021 1800 Gross per 24 hour  Intake 274.78 ml  Output --  Net 274.78 ml    Physical Exam: A&Ox3, NAD CV: RRR Pulmonary: CTA Bilaterally Abdomen: Soft, Nontender, Nondistended Vascular:  Right lower extremity: Thigh soft.  Calf soft.  Extremities warm distally unable to palpate pedal pulses Left lower extremity: Thigh soft.  Calf soft.  Extremities warm distally unable to palpate pedal pulses.  Stable wounds.   Laboratory: CBC    Component Value Date/Time   WBC 8.0 05/30/2021 0723   HGB 13.0 05/30/2021 0723   HGB 14.4 03/30/2018 1404   HCT 38.7 (L) 05/30/2021 0723   HCT 42.9 03/30/2018 1404   PLT 258 05/30/2021 0723   PLT 321 03/30/2018 1404   BMET    Component Value Date/Time   NA 135 05/30/2021 0723   NA 140 03/30/2018 1404   K 4.7 05/30/2021 0723   CL 100 05/30/2021 0723   CO2 27 05/30/2021 0723   GLUCOSE 116 (H) 05/30/2021 0723   BUN 17 05/30/2021 0723   BUN 9 03/30/2018 1404   CREATININE 0.61 05/30/2021 0723   CALCIUM 9.5 05/30/2021 0723   GFRNONAA >60 05/30/2021 0723   GFRAA 129 03/30/2018 1404   Assessment/Planning: The patient is  a 40 year old male who presents with bilateral lower extremity rest pain and ulceration to the left foot   1) unable to revascularize the bilateral lower extremity via angiogram due to the severity of his disease 2) plan for an aortobifem on Friday with Dr. Tuesday 3) appreciate assistance of cardiology in clearing the patient for surgery 4) we will add bowel regimen for constipation  Seen and examined with Dr. Angelica Pou Desha Bitner PA-C 05/30/2021 7:28 PM

## 2021-05-31 ENCOUNTER — Other Ambulatory Visit (INDEPENDENT_AMBULATORY_CARE_PROVIDER_SITE_OTHER): Payer: Self-pay | Admitting: Vascular Surgery

## 2021-05-31 DIAGNOSIS — I70299 Other atherosclerosis of native arteries of extremities, unspecified extremity: Secondary | ICD-10-CM | POA: Diagnosis not present

## 2021-05-31 DIAGNOSIS — L97909 Non-pressure chronic ulcer of unspecified part of unspecified lower leg with unspecified severity: Secondary | ICD-10-CM | POA: Diagnosis not present

## 2021-05-31 LAB — NM MYOCAR MULTI W/SPECT W/WALL MOTION / EF
Estimated workload: 1
Exercise duration (min): 1 min
Exercise duration (sec): 4 s
LV dias vol: 38 mL (ref 62–150)
LV sys vol: 13 mL
MPHR: 179 {beats}/min
Nuc Stress EF: 66 %
Peak HR: 130 {beats}/min
Percent HR: 72 %
Rest HR: 115 {beats}/min
Rest Nuclear Isotope Dose: 10 mCi
SDS: 0
SRS: 1
SSS: 1
ST Depression (mm): 0 mm
Stress Nuclear Isotope Dose: 31.9 mCi
TID: 0.97

## 2021-05-31 LAB — CBC
HCT: 41.1 % (ref 39.0–52.0)
Hemoglobin: 13.8 g/dL (ref 13.0–17.0)
MCH: 29.3 pg (ref 26.0–34.0)
MCHC: 33.6 g/dL (ref 30.0–36.0)
MCV: 87.3 fL (ref 80.0–100.0)
Platelets: 259 10*3/uL (ref 150–400)
RBC: 4.71 MIL/uL (ref 4.22–5.81)
RDW: 14.7 % (ref 11.5–15.5)
WBC: 8 10*3/uL (ref 4.0–10.5)
nRBC: 0 % (ref 0.0–0.2)

## 2021-05-31 LAB — HEPARIN LEVEL (UNFRACTIONATED): Heparin Unfractionated: 0.56 IU/mL (ref 0.30–0.70)

## 2021-05-31 MED ORDER — CHLORHEXIDINE GLUCONATE CLOTH 2 % EX PADS
6.0000 | MEDICATED_PAD | Freq: Once | CUTANEOUS | Status: AC
  Administered 2021-06-01: 6 via TOPICAL

## 2021-05-31 MED ORDER — SODIUM CHLORIDE 0.9 % IV SOLN: INTRAVENOUS | Status: DC

## 2021-05-31 MED ORDER — CEFAZOLIN SODIUM-DEXTROSE 2-4 GM/100ML-% IV SOLN
2.0000 g | INTRAVENOUS | Status: AC
  Administered 2021-06-01 (×2): 2 g via INTRAVENOUS
  Filled 2021-05-31: qty 100

## 2021-05-31 NOTE — Progress Notes (Signed)
[facility] Daily Progress Note  05/29/21:             1.  Contrast injection right iliac arteries and common femoral artery             2.  Contrast injection left common femoral and external iliac artery             3.  Ultrasound-guided access to the common femoral arteries bilaterally  Subjective: Patient continues to endorse bilateral lower extremity discomfort.  No acute issues overnight.  Objective: Vitals:   05/30/21 2048 05/31/21 0402 05/31/21 0628 05/31/21 0818  BP: (!) 121/92 (!) 133/108 (!) 151/98 (!) 137/96  Pulse: (!) 109 (!) 113 (!) 109 (!) 116  Resp: 16 20 17 17   Temp: 98 F (36.7 C) 97.6 F (36.4 C)  97.9 F (36.6 C)  TempSrc: Oral Oral  Oral  SpO2: 100% 100% 100% 100%  Weight:      Height:        Intake/Output Summary (Last 24 hours) at 05/31/2021 1301 Last data filed at 05/31/2021 1037 Gross per 24 hour  Intake 366 ml  Output --  Net 366 ml   Physical Exam: A&Ox3, NAD CV: RRR Pulmonary: CTA Bilaterally Abdomen: Soft, Nontender, Nondistended Vascular:             Right lower extremity: Thigh soft.  Calf soft.  Extremities warm distally unable to palpate pedal pulses Left lower extremity: Thigh soft.  Calf soft.  Extremities warm distally unable to palpate pedal pulses.  Stable wounds.   Laboratory: CBC    Component Value Date/Time   WBC 8.0 05/31/2021 0552   HGB 13.8 05/31/2021 0552   HGB 14.4 03/30/2018 1404   HCT 41.1 05/31/2021 0552   HCT 42.9 03/30/2018 1404   PLT 259 05/31/2021 0552   PLT 321 03/30/2018 1404   BMET    Component Value Date/Time   NA 135 05/30/2021 0723   NA 140 03/30/2018 1404   K 4.7 05/30/2021 0723   CL 100 05/30/2021 0723   CO2 27 05/30/2021 0723   GLUCOSE 116 (H) 05/30/2021 0723   BUN 17 05/30/2021 0723   BUN 9 03/30/2018 1404   CREATININE 0.61 05/30/2021 0723   CALCIUM 9.5 05/30/2021 0723   GFRNONAA >60 05/30/2021 0723   GFRAA 129 03/30/2018 1404   Assessment/Planning: The patient is  a 40 year old male who presents with bilateral lower extremity rest pain and ulceration to the left foot    1) unable to revascularize the bilateral lower extremity via angiogram due to the severity of his disease 2) plan for an aortobifem on Friday with Dr. Tuesday 3) appreciate assistance of cardiology in clearing the patient for surgery   Discussed with Dr. Angelica Pou Janaisa Birkland PA-C 05/31/2021 1:01 PM

## 2021-05-31 NOTE — Progress Notes (Addendum)
[facility] Encounter Note  Patient: Jeffrey Grant. / Admit Date: 05/29/2021 / Date of Encounter: 05/31/2021, 9:52 AM   Subjective: Patient states he is doing well this morning and is in good spirits. He endorses discomfort in his lower extremities but denies chest pain.  Seen sitting upright in recliner with family member present.  Discussed results of echocardiogram and Lexiscan which did not show any evidence of structural, functional, or valvular abnormalities. Surgery with Dr. Murlean Iba scheduled for tomorrow morning.   Review of Systems: Positive for: Bilateral lower extremity pain, left foot ulceration and blister Negative for: Vision change, hearing change, syncope, dizziness, nausea, vomiting,diarrhea, bloody stool, stomach pain, cough, congestion, diaphoresis, urinary frequency, urinary pain,skin lesions, skin rashes Others previously listed  Objective: Physical Exam: Blood pressure (!) 137/96, pulse (!) 116, temperature 97.9 F (36.6 C), temperature source Oral, resp. rate 17, height 5\' 8"  (1.727 m), weight 72.6 kg, SpO2 100 %. Body mass index is 24.33 kg/m. General: Pleasant black male, well developed, well nourished, in no acute distress. Examined sitting upright in recliner. Head eyes ears nose throat: Normocephalic, atraumatic, sclera non-icteric, no xanthomas. No apparent thyromegaly and/or mass      Lungs: Normal respiratory effort.  no wheezes, no rales, no rhonchi.  Heart: RRR with normal S1 S2. no murmur gallop, no rub Abdomen: non-distended. No apparent hepatomegaly. No obvious abdominal masses.  Extremities: Warm distal extremities. Left leg with trace edema.  Left foot quarter sized blister.  Right leg without significant edema. No cyanosis. Peripheral : 2+ bilateral upper extremity pulses, absent bilateral dorsal pedal pulses  Neuro: Alert and oriented. No facial asymmetry. No focal deficit. Moves all extremities  spontaneously. Musculoskeletal: Normal muscle tone without kyphosis Psych:  Responds to questions appropriately with a normal affect.  Intake/Output Summary (Last 24 hours) at 05/31/2021 0952 Last data filed at 05/30/2021 2111 Gross per 24 hour  Intake 366 ml  Output --  Net 366 ml    Inpatient Medications:   amLODipine  5 mg Oral q AM   docusate sodium  100 mg Oral BID   gabapentin  600 mg Oral BID   lisinopril  20 mg Oral Daily   nicotine  21 mg Transdermal Daily   pregabalin  100 mg Oral TID   sodium chloride flush  3 mL Intravenous Q12H   Infusions:   heparin 1,000 Units/hr (05/30/21 1415)    Labs: Recent Labs    05/29/21 0714 05/30/21 0723  NA  --  135  K  --  4.7  CL  --  100  CO2  --  27  GLUCOSE  --  116*  BUN 27* 17  CREATININE 0.83 0.61  CALCIUM  --  9.5   No results for input(s): AST, ALT, ALKPHOS, BILITOT, PROT, ALBUMIN in the last 72 hours. Recent Labs    05/30/21 0723 05/31/21 0552  WBC 8.0 8.0  HGB 13.0 13.8  HCT 38.7* 41.1  MCV 88.4 87.3  PLT 258 259   No results for input(s): CKTOTAL, CKMB, TROPONINI in the last 72 hours. Invalid input(s): POCBNP No results for input(s): HGBA1C in the last 72 hours.   Weights: Filed Weights   05/29/21 0728  Weight: 72.6 kg     Radiology/Studies:  CT ANGIO AO+BIFEM W & OR WO CONTRAST  Result Date: 05/29/2021 CLINICAL DATA:  Intermittent lower extremity pain. Evaluate for peripheral arterial disease. EXAM: CT ANGIOGRAPHY OF ABDOMINAL AORTA WITH ILIOFEMORAL RUNOFF TECHNIQUE: Multidetector CT imaging of the abdomen,  pelvis and lower extremities was performed using the standard protocol during bolus administration of intravenous contrast. Multiplanar CT image reconstructions and MIPs were obtained to evaluate the vascular anatomy. CONTRAST:  13mL OMNIPAQUE IOHEXOL 350 MG/ML SOLN COMPARISON:  CT abdomen/pelvis 04/14/2018 FINDINGS: VASCULAR Aorta: Heterogeneous atherosclerotic plaque, predominantly fibrofatty  visualized in the supra visceral abdominal aorta. Heavy fibrofatty atherosclerotic plaque versus mural thrombus in the infrarenal aorta resulting in complete occlusion. There is likely an element of thrombus extending from the inferior aspect of the occlusion into the left common iliac artery. Celiac: Patent without evidence of aneurysm, dissection, vasculitis or significant stenosis. SMA: Patent without evidence of aneurysm, dissection, vasculitis or significant stenosis. Renals: Both renal arteries are patent without evidence of aneurysm, dissection, vasculitis, fibromuscular dysplasia or significant stenosis. IMA: Occluded at the origin. The distal aspect reconstitutes via collateral flow from the SMA. RIGHT Lower Extremity Inflow: Plaque extends into the common iliac artery resulting in approximately 50% narrowing in the proximal and mid segment. The internal iliac artery is occluded at the origin. The external iliac artery is also diffusely diseased with limited flow. Likely high-grade stenosis distally just proximal to the inguinal ligament. Outflow: Small caliber common femoral artery secondary to proximal occlusive disease. The profunda and superficial femoral arteries remain patent. Mild atherosclerotic plaque results in mild stenosis at Hunter's canal. The popliteal artery is mildly diseased but patent. Runoff: Multifocal runoff disease with proximal occlusion of the anterior tibial artery. Probable 2 vessel runoff to the ankle. LEFT Lower Extremity Inflow: Atherosclerotic plaque and thrombus result in high-grade stenosis of the left common iliac artery. Chronic occlusion of the internal iliac artery. Focal occlusion of the proximal external iliac artery. Outflow: Small caliber common femoral artery secondary to proximal occlusive disease. The profunda and superficial femoral arteries are patent. Scattered plaque but difficult to assess for significant stenosis given the overall small caliber of the  vessel. Suspect high-grade stenosis as the vessel exits the Hunter's canal. The popliteal artery is diseased but patent. Runoff: Multifocal runoff disease. Proximal occlusion of the anterior tibial artery. Patent 2 vessel runoff to the ankle. Veins: No focal venous abnormality. Review of the MIP images confirms the above findings. NON-VASCULAR Lower chest: Dependent atelectasis.  No acute abnormality. Hepatobiliary: No focal liver abnormality is seen. No gallstones, gallbladder wall thickening, or biliary dilatation. Pancreas: Unremarkable. No pancreatic ductal dilatation or surrounding inflammatory changes. Spleen: Normal in size without focal abnormality. Adrenals/Urinary Tract: Normal adrenal glands. No hydronephrosis or nephrolithiasis. Circumscribed 1.8 cm simple cyst in the upper pole of the left kidney. No enhancing renal mass. Stomach/Bowel: No evidence of obstruction or focal bowel wall thickening. Normal appendix in the right lower quadrant. The terminal ileum is unremarkable. Lymphatic: No suspicious lymphadenopathy. Reproductive: Prostate is unremarkable. Other: No abdominal wall hernia or abnormality. No abdominopelvic ascites. Musculoskeletal: No acute or significant osseous findings. Surgical changes of prior right hip arthroplasty. IMPRESSION: VASCULAR 1. 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 2. Focal high-grade stenosis of the left superficial femoral artery in the region of Hunter's canal. 3. Multifocal runoff disease bilaterally with chronic occlusion of the anterior tibial arteries. 4. No evidence of aneurysm or dissection. NON-VASCULAR 1. No acute abnormality within the abdomen or pelvis. 2. Ancillary findings as above. Aortic Atherosclerosis (ICD10-I70.0). Electronically Signed   By: Jacqulynn Cadet M.D.   On: 05/29/2021 10:38   PERIPHERAL VASCULAR  CATHETERIZATION  Result Date: 05/29/2021 See surgical note  for result.  ECHOCARDIOGRAM COMPLETE  Result Date: 05/30/2021    ECHOCARDIOGRAM REPORT   Patient Name:   Gemini Blanchett. Date of Exam: 05/30/2021 Medical Rec #:  [id number]         Height:       68.0 in Accession #:    [id number]        Weight:       160.0 lb Date of Birth:  01-27-1981         BSA:          1.859 m Patient Age:    75 years          BP:           130/89 mmHg Patient Gender: M                 HR:           98 bpm. Exam Location:  [facility] Procedure: 2D Echo, Cardiac Doppler and Color Doppler Indications:     Abnormal ECG R94.31  History:         Patient has no prior history of Echocardiogram examinations.                  Signs/Symptoms:Dyspnea. Anxiety, PVD.  Sonographer:     Sherrie Sport Referring Phys:  [id number] [place] Diagnosing Phys: Serafina Royals MD  Sonographer Comments: Suboptimal apical window. IMPRESSIONS  1. Left ventricular ejection fraction, by estimation, is 60 to 65%. The left ventricle has normal function. The left ventricle has no regional wall motion abnormalities. Left ventricular diastolic parameters were normal.  2. Right ventricular systolic function is normal. The right ventricular size is normal.  3. The mitral valve is normal in structure. Trivial mitral valve regurgitation.  4. The aortic valve is normal in structure. Aortic valve regurgitation is not visualized. FINDINGS  Left Ventricle: Left ventricular ejection fraction, by estimation, is 60 to 65%. The left ventricle has normal function. The left ventricle has no regional wall motion abnormalities. The left ventricular internal cavity size was normal in size. There is  no left ventricular hypertrophy. Left ventricular diastolic parameters were normal. Right Ventricle: The right ventricular size is normal. No increase in right ventricular wall thickness. Right ventricular systolic function is normal. Left Atrium: Left atrial size was normal in size. Right  Atrium: Right atrial size was normal in size. Pericardium: There is no evidence of pericardial effusion. Mitral Valve: The mitral valve is normal in structure. Trivial mitral valve regurgitation. MV peak gradient, 3.3 mmHg. The mean mitral valve gradient is 2.0 mmHg. Tricuspid Valve: The tricuspid valve is normal in structure. Tricuspid valve regurgitation is trivial. Aortic Valve: The aortic valve is normal in structure. Aortic valve regurgitation is not visualized. Aortic valve mean gradient measures 2.0 mmHg. Aortic valve peak gradient measures 3.5 mmHg. Aortic valve area, by VTI measures 3.74 cm. Pulmonic Valve: The pulmonic valve was normal in structure. Pulmonic valve regurgitation is not visualized. Aorta: The aortic root and ascending aorta are structurally normal, with no evidence of dilitation. IAS/Shunts: No atrial level shunt detected by color flow Doppler.  LEFT VENTRICLE PLAX 2D LVIDd:         3.50 cm   Diastology LVIDs:         2.30 cm   LV e' medial:    7.07 cm/s LV PW:         1.20 cm   LV E/e' medial:  7.5 LV IVS:  0.67 cm   LV e' lateral:   9.25 cm/s LVOT diam:     2.00 cm   LV E/e' lateral: 5.7 LV SV:         43 LV SV Index:   23 LVOT Area:     3.14 cm  RIGHT VENTRICLE RV Basal diam:  3.20 cm RV S prime:     14.00 cm/s TAPSE (M-mode): 3.9 cm LEFT ATRIUM           Index        RIGHT ATRIUM           Index LA diam:      2.50 cm 1.34 cm/m   RA Area:     12.30 cm LA Vol (A2C): 24.3 ml 13.07 ml/m  RA Volume:   29.80 ml  16.03 ml/m LA Vol (A4C): 17.3 ml 9.31 ml/m  AORTIC VALVE                    PULMONIC VALVE AV Area (Vmax):    2.90 cm     PV Vmax:        0.96 m/s AV Area (Vmean):   2.92 cm     PV Vmean:       67.300 cm/s AV Area (VTI):     3.74 cm     PV VTI:         0.149 m AV Vmax:           93.30 cm/s   PV Peak grad:   3.7 mmHg AV Vmean:          64.800 cm/s  PV Mean grad:   2.0 mmHg AV VTI:            0.116 m      RVOT Peak grad: 2 mmHg AV Peak Grad:      3.5 mmHg AV Mean Grad:       2.0 mmHg LVOT Vmax:         86.20 cm/s LVOT Vmean:        60.200 cm/s LVOT VTI:          0.138 m LVOT/AV VTI ratio: 1.19  AORTA Ao Root diam: 3.00 cm MITRAL VALVE MV Area (PHT): 6.07 cm    SHUNTS MV Area VTI:   3.52 cm    Systemic VTI:  0.14 m MV Peak grad:  3.3 mmHg    Systemic Diam: 2.00 cm MV Mean grad:  2.0 mmHg    Pulmonic VTI:  0.118 m MV Vmax:       0.90 m/s MV Vmean:      64.0 cm/s MV Decel Time: 125 msec MV E velocity: 52.70 cm/s MV A velocity: 58.30 cm/s MV E/A ratio:  0.90 Serafina Royals MD Electronically signed by Serafina Royals MD Signature Date/Time: 05/30/2021/12:08:37 PM    Final    VAS [place] LOWER EXTREMITY ARTERIAL DUPLEX  Result Date: 05/10/2021 LOWER EXTREMITY ARTERIAL DUPLEX STUDY Patient Name:  Reaford Mioduszewski.  Date of Exam:   05/10/2021 Medical Rec #: [id number]          Accession #:    [id number] Date of Birth: Sep 12, 1980          Patient Gender: M Patient Age:   72 years Exam Location:  [facility] Procedure:      VAS [place] LOWER EXTREMITY ARTERIAL DUPLEX Referring Phys: Serafina Royals --------------------------------------------------------------------------------  Indications: Rest pain.  Current ABI: Not Obtained Performing Technologist: Almira Coaster RVS  Examination Guidelines: A complete evaluation includes B-mode imaging, spectral Doppler, color Doppler, and power Doppler as needed of all accessible portions of each vessel. Bilateral testing is considered an integral part of a complete examination. Limited examinations for reoccurring indications may be performed as noted.   +-----------+--------+-----+--------+-------------+--------+ LEFT       PSV cm/sRatioStenosisWaveform     Comments +-----------+--------+-----+--------+-------------+--------+ CFA Distal 49                   monophasic            +-----------+--------+-----+--------+-------------+--------+ DFA        35                   monophasic             +-----------+--------+-----+--------+-------------+--------+ SFA Prox   54                   monophasic            +-----------+--------+-----+--------+-------------+--------+ SFA Mid    39                   monophasic            +-----------+--------+-----+--------+-------------+--------+ SFA Distal 81                   monophasic            +-----------+--------+-----+--------+-------------+--------+ POP Distal 33                   monophasic            +-----------+--------+-----+--------+-------------+--------+ ATA Distal 0                    Absent                +-----------+--------+-----+--------+-------------+--------+ PTA Distal 11                   Non Pulsatile         +-----------+--------+-----+--------+-------------+--------+ PERO Distal9                    Non pulsatile         +-----------+--------+-----+--------+-------------+--------+  Summary: Left: Imaging and Waveforms obtained throughout in the Left Lower Extremity. Monophasic Waveforms obtained throughout in Arteries in the Left Lower Extremity. No flow seen in the Left ATA, Non pulsatile flow seen in the ATA/Peroneal Artery.  See table(s) above for measurements and observations. Electronically signed by [name] MD on 05/10/2021 at 4:38:40 PM.    Final    VAS [place] LOWER EXTREMITY VENOUS REFLUX  Result Date: 05/10/2021  Lower Venous Reflux Study Patient Name:  Jeremmy Blaesing.  Date of Exam:   05/10/2021 Medical Rec #: [id number]          Accession #:    [id number] Date of Birth: 1980-10-19          Patient Gender: M Patient Age:   67 years Exam Location:  [facility] Procedure:      VAS [place] LOWER EXTREMITY VENOUS REFLUX Referring Phys: Serafina Royals --------------------------------------------------------------------------------  Indications: Pain, and Swelling.  Performing Technologist: Almira Coaster RVS  Examination Guidelines: A complete evaluation includes B-mode  imaging, spectral Doppler, color Doppler, and power Doppler as needed of all accessible portions of each vessel. Bilateral testing is considered an integral part of a complete examination. Limited examinations for reoccurring indications may be performed as noted. The reflux  portion of the exam is performed with the patient in reverse Trendelenburg. Significant venous reflux is defined as >500 ms in the superficial venous system, and >1 second in the deep venous system.  Venous Reflux Times +--------------+---------+------+-----------+------------+--------+ RIGHT         Reflux NoRefluxReflux TimeDiameter cmsComments                         Yes                                  +--------------+---------+------+-----------+------------+--------+ CFV           no                                             +--------------+---------+------+-----------+------------+--------+ FV prox       no                                             +--------------+---------+------+-----------+------------+--------+ FV mid        no                                             +--------------+---------+------+-----------+------------+--------+ FV dist       no                                             +--------------+---------+------+-----------+------------+--------+ Popliteal     no                                             +--------------+---------+------+-----------+------------+--------+ GSV at SFJ    no                            .47              +--------------+---------+------+-----------+------------+--------+ GSV prox thigh          yes    3073 ms      .40              +--------------+---------+------+-----------+------------+--------+ GSV mid thigh no                            .40              +--------------+---------+------+-----------+------------+--------+ GSV dist thighno                            .35               +--------------+---------+------+-----------+------------+--------+ GSV at knee   no                            .36              +--------------+---------+------+-----------+------------+--------+  GSV prox calf no                            .33              +--------------+---------+------+-----------+------------+--------+ SSV Pop Fossa no                            .29              +--------------+---------+------+-----------+------------+--------+  +--------------+---------+------+-----------+------------+--------+ LEFT          Reflux NoRefluxReflux TimeDiameter cmsComments                         Yes                                  +--------------+---------+------+-----------+------------+--------+ CFV           no                                             +--------------+---------+------+-----------+------------+--------+ FV prox       no                                             +--------------+---------+------+-----------+------------+--------+ FV mid        no                                             +--------------+---------+------+-----------+------------+--------+ FV dist       no                                             +--------------+---------+------+-----------+------------+--------+ Popliteal     no                                             +--------------+---------+------+-----------+------------+--------+ GSV at SFJ    no                            .36              +--------------+---------+------+-----------+------------+--------+ GSV prox thighno                            .54              +--------------+---------+------+-----------+------------+--------+ GSV mid thigh no                            .36              +--------------+---------+------+-----------+------------+--------+ GSV dist thighno                            .  45               +--------------+---------+------+-----------+------------+--------+ GSV at knee   no                            .43              +--------------+---------+------+-----------+------------+--------+ GSV prox calf no                            .38              +--------------+---------+------+-----------+------------+--------+   Summary: Bilateral: - No evidence of deep vein thrombosis seen in the lower extremities, bilaterally, from the common femoral through the popliteal veins. - No evidence of superficial venous thrombosis in the lower extremities, bilaterally. - No evidence of deep venous insufficiency seen bilaterally in the lower extremity. - No evidence of superficial venous reflux seen in the short saphenous veins bilaterally.  Right: - Venous reflux is noted in the right greater saphenous vein in the thigh.  - Incidental finding: The Right CFA displays Monophasic Flow.  Left: - No evidence of superficial venous reflux seen in the left greater saphenous vein.  *See table(s) above for measurements and observations. Electronically signed by [name] MD on 05/10/2021 at 4:38:28 PM.    Final      Assessment and Recommendation  40 y.o. male with significant tobacco abuse and peripheral vascular disease with occluded aorta and ulceration of lower extremities and no current evidence of cardiovascular symptoms needing further risk stratification.   #medical optimization prior to aortobifemoral bypass  #bilateral lower extremity claudication at rest and left foot ulceration -Lexiscan infusion Myoview and echocardiogram for risk stratification revealed no structural, functional, valvular abnormalities. Patient is considered medically optimized from a cardiac standpoint for surgery with Dr. Murlean Iba 06/01/2021.  #Hypertension  -Continue lisinopril and amlodipine  #Hyperlipidemia -continue to recommend statin therapy, patient declined at previous office visit.   #tobacco  abuse -encouraged cessation  Patient seen and examined with Dr. Serafina Royals.  Signed, Orinda Kenner, PA-C  The patient has been interviewed and examined. I agree with assessment and plan above. Serafina Royals MD [facility]

## 2021-05-31 NOTE — Consult Note (Signed)
ANTICOAGULATION CONSULT NOTE  Pharmacy Consult for heparin Indication: PAD  No Known Allergies  Patient Measurements: Height: 5\' 8"  (172.7 cm) Weight: 72.6 kg (160 lb) IBW/kg (Calculated) : 68.4  Vital Signs: Temp: 97.6 F (36.4 C) (12/08 0402) Temp Source: Oral (12/08 0402) BP: 151/98 (12/08 0628) Pulse Rate: 109 (12/08 0628)  Labs: Recent Labs     0000 05/29/21 0714 05/29/21 1607 05/29/21 2351 05/30/21 0723 05/30/21 1358 05/31/21 0552  HGB   < >  --  13.1  --  13.0  --  13.8  HCT  --   --  40.5  --  38.7*  --  41.1  PLT  --   --  292  --  258  --  259  APTT  --   --  37*  --   --   --   --   LABPROT  --   --  12.7  --   --   --   --   INR  --   --  1.0  --   --   --   --   HEPARINUNFRC  --   --   --    < > 0.69 0.42 0.56  CREATININE  --  0.83  --   --  0.61  --   --    < > = values in this interval not displayed.     Estimated Creatinine Clearance: 117.6 mL/min (by C-G formula based on SCr of 0.61 mg/dL).   Medical History: Past Medical History:  Diagnosis Date   Anxiety    Atherosclerotic PVD with intermittent claudication (HCC)    Back pain    Collagen vascular disease (HCC)    Dyspnea    Dysrhythmia    Elevated lipids    Hypertension    Nausea    Seropositive rheumatoid arthritis (HCC)     Medications:  No PTA anticoagulation or antiplatelet therapy   Assessment: 40 y.o. male with a past medical history of bilateral PVD, HTN, HLD, and tobacco use who presented with chief complaint of leg pain. CT showed severe aortoiliac occlusive disease, s/p angiography of the lower extremities. Pharmacy has been consulted for heparin dosing for PAD. H&H, platelets are wnl and stable  Goal of Therapy:  Heparin level 0.3-0.7 units/ml Monitor platelets by anticoagulation protocol: Yes   Plan:  heparin level remains therapeutic Continue heparin infusion at 1000 units/hr and recheck next heparin level in am 06/01/21 Daily CBC while on IV heparin  14/09/22, PharmD 05/31/2021,7:30 AM

## 2021-05-31 NOTE — H&P (View-Only) (Signed)
[facility] Daily Progress Note  05/29/21:             1.  Contrast injection right iliac arteries and common femoral artery             2.  Contrast injection left common femoral and external iliac artery             3.  Ultrasound-guided access to the common femoral arteries bilaterally  Subjective: Patient continues to endorse bilateral lower extremity discomfort.  No acute issues overnight.  Objective: Vitals:   05/30/21 2048 05/31/21 0402 05/31/21 0628 05/31/21 0818  BP: (!) 121/92 (!) 133/108 (!) 151/98 (!) 137/96  Pulse: (!) 109 (!) 113 (!) 109 (!) 116  Resp: 16 20 17 17   Temp: 98 F (36.7 C) 97.6 F (36.4 C)  97.9 F (36.6 C)  TempSrc: Oral Oral  Oral  SpO2: 100% 100% 100% 100%  Weight:      Height:        Intake/Output Summary (Last 24 hours) at 05/31/2021 1301 Last data filed at 05/31/2021 1037 Gross per 24 hour  Intake 366 ml  Output --  Net 366 ml   Physical Exam: A&Ox3, NAD CV: RRR Pulmonary: CTA Bilaterally Abdomen: Soft, Nontender, Nondistended Vascular:             Right lower extremity: Thigh soft.  Calf soft.  Extremities warm distally unable to palpate pedal pulses Left lower extremity: Thigh soft.  Calf soft.  Extremities warm distally unable to palpate pedal pulses.  Stable wounds.   Laboratory: CBC    Component Value Date/Time   WBC 8.0 05/31/2021 0552   HGB 13.8 05/31/2021 0552   HGB 14.4 03/30/2018 1404   HCT 41.1 05/31/2021 0552   HCT 42.9 03/30/2018 1404   PLT 259 05/31/2021 0552   PLT 321 03/30/2018 1404   BMET    Component Value Date/Time   NA 135 05/30/2021 0723   NA 140 03/30/2018 1404   K 4.7 05/30/2021 0723   CL 100 05/30/2021 0723   CO2 27 05/30/2021 0723   GLUCOSE 116 (H) 05/30/2021 0723   BUN 17 05/30/2021 0723   BUN 9 03/30/2018 1404   CREATININE 0.61 05/30/2021 0723   CALCIUM 9.5 05/30/2021 0723   GFRNONAA >60 05/30/2021 0723   GFRAA 129 03/30/2018 1404   Assessment/Planning: The patient is  a 40 year old male who presents with bilateral lower extremity rest pain and ulceration to the left foot    1) unable to revascularize the bilateral lower extremity via angiogram due to the severity of his disease 2) plan for an aortobifem on Friday with Dr. Tuesday 3) appreciate assistance of cardiology in clearing the patient for surgery   Discussed with Dr. Angelica Pou Berneita Sanagustin PA-C 05/31/2021 1:01 PM

## 2021-05-31 NOTE — Progress Notes (Signed)
   05/31/21 1400  Clinical Encounter Type  Visited With Patient  Visit Type Initial;Spiritual support  Referral From Nurse  Consult/Referral To Chaplain  Spiritual Encounters  Spiritual Needs Prayer;Emotional  Chaplain Rosalind Guido provided spiritual and emotional support to Mr. Jeffrey Grant. [name] is scheduled for a major surgery on tomorrow morning and was desiring prayer.

## 2021-05-31 NOTE — Progress Notes (Signed)
No s/s of distress noted. Pain medication given. Pain 8/10. Will continue to monitor pain and VS.   05/31/21 0402  Assess: MEWS Score  Temp 97.6 F (36.4 C)  BP (!) 133/108  Pulse Rate (!) 113  Resp 20  SpO2 100 %  O2 Device Room Air  Assess: MEWS Score  MEWS Temp 0  MEWS Systolic 0  MEWS Pulse 2  MEWS RR 0  MEWS LOC 0  MEWS Score 2  MEWS Score Color Yellow  Assess: if the MEWS score is Yellow or Red  Were vital signs taken at a resting state? Yes  Focused Assessment No change from prior assessment  Does the patient meet 2 or more of the SIRS criteria? No  MEWS guidelines implemented *See Row Information* No, previously yellow, continue vital signs every 4 hours  Treat  MEWS Interventions Administered prn meds/treatments (Cont VS every 4hours)  Pain Scale 0-10  Pain Score 8  Pain Type Chronic pain  Pain Location Foot  Pain Orientation Right;Left  Pain Intervention(s) Pain med given for lower pain score than stated, per patient request  Multiple Pain Sites No  Take Vital Signs  Increase Vital Sign Frequency   (no cont q4h VS)  Document  Patient Outcome Other (Comment) (Patient stable and shows no s/s of distress)  Progress note created (see row info) Yes  Assess: SIRS CRITERIA  SIRS Temperature  0  SIRS Pulse 1  SIRS Respirations  0  SIRS WBC 0  SIRS Score Sum  1

## 2021-06-01 ENCOUNTER — Encounter: Payer: Self-pay | Admitting: Vascular Surgery

## 2021-06-01 ENCOUNTER — Encounter
Admission: RE | Disposition: A | Discharge status: Home Health | Payer: Self-pay | Source: Home / Self Care | Attending: Vascular Surgery

## 2021-06-01 ENCOUNTER — Inpatient Hospital Stay: Payer: Medicaid Other | Admitting: Anesthesiology

## 2021-06-01 DIAGNOSIS — L97909 Non-pressure chronic ulcer of unspecified part of unspecified lower leg with unspecified severity: Secondary | ICD-10-CM

## 2021-06-01 DIAGNOSIS — I70299 Other atherosclerosis of native arteries of extremities, unspecified extremity: Secondary | ICD-10-CM | POA: Diagnosis not present

## 2021-06-01 DIAGNOSIS — I70239 Atherosclerosis of native arteries of right leg with ulceration of unspecified site: Secondary | ICD-10-CM

## 2021-06-01 DIAGNOSIS — I70249 Atherosclerosis of native arteries of left leg with ulceration of unspecified site: Secondary | ICD-10-CM

## 2021-06-01 HISTORY — PX: AORTA - BILATERAL FEMORAL ARTERY BYPASS GRAFT: SHX1175

## 2021-06-01 LAB — CBC
HCT: 41 % (ref 39.0–52.0)
HCT: 32.7 % — ABNORMAL LOW (ref 39.0–52.0)
Hemoglobin: 13.6 g/dL (ref 13.0–17.0)
Hemoglobin: 11 g/dL — ABNORMAL LOW (ref 13.0–17.0)
MCH: 28.9 pg (ref 26.0–34.0)
MCH: 29.5 pg (ref 26.0–34.0)
MCHC: 33.2 g/dL (ref 30.0–36.0)
MCHC: 33.6 g/dL (ref 30.0–36.0)
MCV: 87.2 fL (ref 80.0–100.0)
MCV: 87.7 fL (ref 80.0–100.0)
Platelets: 274 10*3/uL (ref 150–400)
Platelets: 194 10*3/uL (ref 150–400)
RBC: 4.7 MIL/uL (ref 4.22–5.81)
RBC: 3.73 MIL/uL — ABNORMAL LOW (ref 4.22–5.81)
RDW: 14.8 % (ref 11.5–15.5)
RDW: 14.8 % (ref 11.5–15.5)
WBC: 8.5 10*3/uL (ref 4.0–10.5)
WBC: 12.1 10*3/uL — ABNORMAL HIGH (ref 4.0–10.5)
nRBC: 0 % (ref 0.0–0.2)
nRBC: 0 % (ref 0.0–0.2)

## 2021-06-01 LAB — BLOOD GAS, ARTERIAL
Acid-Base Excess: 1.7 mmol/L (ref 0.0–2.0)
Acid-base deficit: 1.2 mmol/L (ref 0.0–2.0)
Acid-base deficit: 3.9 mmol/L — ABNORMAL HIGH (ref 0.0–2.0)
Bicarbonate: 25.8 mmol/L (ref 20.0–28.0)
Bicarbonate: 23.6 mmol/L (ref 20.0–28.0)
Bicarbonate: 22.2 mmol/L (ref 20.0–28.0)
FIO2: 0.4
FIO2: 0.4
MECHVT: 500 mL
MECHVT: 500 mL
Mechanical Rate: 10
O2 Saturation: 99.7 %
O2 Saturation: 99.6 %
O2 Saturation: 99.5 %
PEEP: 7 cmH2O
PEEP: 7 cmH2O
Patient temperature: 37
Patient temperature: 35.8
Patient temperature: 35.9
RATE: 13 resp/min
pCO2 arterial: 38 mmHg (ref 32.0–48.0)
pCO2 arterial: 37 mmHg (ref 32.0–48.0)
pCO2 arterial: 42 mmHg (ref 32.0–48.0)
pH, Arterial: 7.44 (ref 7.350–7.450)
pH, Arterial: 7.41 (ref 7.350–7.450)
pH, Arterial: 7.33 — ABNORMAL LOW (ref 7.350–7.450)
pO2, Arterial: 197 mmHg — ABNORMAL HIGH (ref 83.0–108.0)
pO2, Arterial: 175 mmHg — ABNORMAL HIGH (ref 83.0–108.0)
pO2, Arterial: 169 mmHg — ABNORMAL HIGH (ref 83.0–108.0)

## 2021-06-01 LAB — GLUCOSE, CAPILLARY
Glucose-Capillary: 171 mg/dL — ABNORMAL HIGH (ref 70–99)
Glucose-Capillary: 171 mg/dL — ABNORMAL HIGH (ref 70–99)

## 2021-06-01 LAB — BASIC METABOLIC PANEL
Anion gap: 9 (ref 5–15)
Anion gap: 8 (ref 5–15)
BUN: 20 mg/dL (ref 6–20)
BUN: 16 mg/dL (ref 6–20)
CO2: 28 mmol/L (ref 22–32)
CO2: 24 mmol/L (ref 22–32)
Calcium: 9.6 mg/dL (ref 8.9–10.3)
Calcium: 8.6 mg/dL — ABNORMAL LOW (ref 8.9–10.3)
Chloride: 100 mmol/L (ref 98–111)
Chloride: 100 mmol/L (ref 98–111)
Creatinine, Ser: 0.76 mg/dL (ref 0.61–1.24)
Creatinine, Ser: 0.71 mg/dL (ref 0.61–1.24)
GFR, Estimated: >60 mL/min (ref >60)
GFR, Estimated: >60 mL/min (ref >60)
Glucose, Bld: 134 mg/dL — ABNORMAL HIGH (ref 70–99)
Glucose, Bld: 162 mg/dL — ABNORMAL HIGH (ref 70–99)
Potassium: 4.5 mmol/L (ref 3.5–5.1)
Potassium: 4.7 mmol/L (ref 3.5–5.1)
Sodium: 137 mmol/L (ref 135–145)
Sodium: 132 mmol/L — ABNORMAL LOW (ref 135–145)

## 2021-06-01 LAB — CREATININE, SERUM
Creatinine, Ser: 0.75 mg/dL (ref 0.61–1.24)
GFR, Estimated: >60 mL/min (ref >60)

## 2021-06-01 LAB — PROTIME-INR
INR: 0.9 (ref 0.8–1.2)
Prothrombin Time: 11.9 seconds (ref 11.4–15.2)

## 2021-06-01 LAB — MAGNESIUM: Magnesium: 2.5 mg/dL — ABNORMAL HIGH (ref 1.7–2.4)

## 2021-06-01 LAB — MRSA NEXT GEN BY PCR, NASAL: MRSA by PCR Next Gen: NOT DETECTED

## 2021-06-01 SURGERY — CREATION, BYPASS, ARTERIAL, AORTA TO FEMORAL, BILATERAL, USING GRAFT
Anesthesia: General

## 2021-06-01 MED ORDER — GUAIFENESIN-DM 100-10 MG/5ML PO SYRP: 15.0000 mL | ORAL_SOLUTION | ORAL | Status: DC | PRN

## 2021-06-01 MED ORDER — SODIUM CHLORIDE 0.9 % IV SOLN: INTRAVENOUS | Status: DC | PRN

## 2021-06-01 MED ORDER — ONDANSETRON HCL 4 MG/2ML IJ SOLN
4.0000 mg | Freq: Four times a day (QID) | INTRAMUSCULAR | Status: DC | PRN
  Administered 2021-06-01: 4 mg via INTRAVENOUS
  Filled 2021-06-01: qty 2

## 2021-06-01 MED ORDER — HYDROMORPHONE HCL 1 MG/ML IJ SOLN
1.0000 mg | Freq: Once | INTRAMUSCULAR | Status: AC
  Administered 2021-06-01: 1 mg via INTRAVENOUS

## 2021-06-01 MED ORDER — DEXMEDETOMIDINE HCL IN NACL 200 MCG/50ML IV SOLN
INTRAVENOUS | Status: AC
  Filled 2021-06-01: qty 50

## 2021-06-01 MED ORDER — FAMOTIDINE IN NACL 20-0.9 MG/50ML-% IV SOLN
20.0000 mg | Freq: Two times a day (BID) | INTRAVENOUS | Status: DC
  Administered 2021-06-01 – 2021-06-05 (×9): 20 mg via INTRAVENOUS
  Filled 2021-06-01 (×10): qty 50

## 2021-06-01 MED ORDER — LABETALOL HCL 5 MG/ML IV SOLN
10.0000 mg | INTRAVENOUS | Status: DC | PRN
  Administered 2021-06-01 (×2): 10 mg via INTRAVENOUS
  Filled 2021-06-01: qty 4

## 2021-06-01 MED ORDER — LABETALOL HCL 5 MG/ML IV SOLN
INTRAVENOUS | Status: AC
  Administered 2021-06-01: 10 mg via INTRAVENOUS
  Filled 2021-06-01: qty 4

## 2021-06-01 MED ORDER — ALUM & MAG HYDROXIDE-SIMETH 200-200-20 MG/5ML PO SUSP: 15.0000 mL | ORAL | Status: DC | PRN

## 2021-06-01 MED ORDER — CHLORHEXIDINE GLUCONATE CLOTH 2 % EX PADS
6.0000 | MEDICATED_PAD | Freq: Every day | CUTANEOUS | Status: DC
  Administered 2021-06-02 – 2021-06-04 (×3): 6 via TOPICAL

## 2021-06-01 MED ORDER — PROPOFOL 10 MG/ML IV BOLUS
INTRAVENOUS | Status: AC
  Filled 2021-06-01: qty 40

## 2021-06-01 MED ORDER — CLEVIDIPINE BUTYRATE 0.5 MG/ML IV EMUL
0.0000 mg/h | INTRAVENOUS | Status: DC
  Administered 2021-06-01: 6 mg/h via INTRAVENOUS
  Administered 2021-06-01: 8 mg/h via INTRAVENOUS
  Administered 2021-06-01: 5 mg/h via INTRAVENOUS
  Administered 2021-06-02: 10 mg/h via INTRAVENOUS
  Administered 2021-06-02 (×2): 11 mg/h via INTRAVENOUS
  Administered 2021-06-02: 2.5 mg/h via INTRAVENOUS
  Administered 2021-06-02 (×2): 11 mg/h via INTRAVENOUS
  Administered 2021-06-03: 2.5 mg/h via INTRAVENOUS
  Filled 2021-06-01 (×11): qty 50

## 2021-06-01 MED ORDER — DEXMEDETOMIDINE HCL IN NACL 200 MCG/50ML IV SOLN
INTRAVENOUS | Status: DC | PRN
  Administered 2021-06-01 (×2): 8 ug via INTRAVENOUS
  Administered 2021-06-01: 4 ug via INTRAVENOUS
  Administered 2021-06-01: 20 ug via INTRAVENOUS

## 2021-06-01 MED ORDER — MAGNESIUM SULFATE 2 GM/50ML IV SOLN
2.0000 g | Freq: Every day | INTRAVENOUS | Status: DC | PRN
  Filled 2021-06-01: qty 50

## 2021-06-01 MED ORDER — POTASSIUM CHLORIDE CRYS ER 20 MEQ PO TBCR
20.0000 meq | EXTENDED_RELEASE_TABLET | Freq: Every day | ORAL | Status: DC | PRN
Start: 2021-06-01 — End: 2021-06-02

## 2021-06-01 MED ORDER — PHENYLEPHRINE HCL (PRESSORS) 10 MG/ML IV SOLN
INTRAVENOUS | Status: DC | PRN
Start: 2021-06-01 — End: 2021-06-01
  Administered 2021-06-01 (×2): 80 ug via INTRAVENOUS

## 2021-06-01 MED ORDER — LISINOPRIL 20 MG PO TABS
20.0000 mg | ORAL_TABLET | Freq: Every day | ORAL | Status: DC
  Administered 2021-06-02: 20 mg via ORAL
  Filled 2021-06-01: qty 1

## 2021-06-01 MED ORDER — DOCUSATE SODIUM 100 MG PO CAPS
100.0000 mg | ORAL_CAPSULE | Freq: Every day | ORAL | Status: DC
  Administered 2021-06-02 – 2021-06-05 (×4): 100 mg via ORAL
  Filled 2021-06-01 (×4): qty 1

## 2021-06-01 MED ORDER — EPINEPHRINE PF 1 MG/ML IJ SOLN
INTRAMUSCULAR | Status: AC
  Filled 2021-06-01: qty 2

## 2021-06-01 MED ORDER — DOPAMINE-DEXTROSE 3.2-5 MG/ML-% IV SOLN: 3.0000 ug/kg/min | INTRAVENOUS | Status: DC

## 2021-06-01 MED ORDER — PROPOFOL 10 MG/ML IV BOLUS
INTRAVENOUS | Status: DC | PRN
  Administered 2021-06-01: 200 mg via INTRAVENOUS

## 2021-06-01 MED ORDER — DEXAMETHASONE SODIUM PHOSPHATE 10 MG/ML IJ SOLN
INTRAMUSCULAR | Status: DC | PRN
  Administered 2021-06-01: 4 mg via INTRAVENOUS

## 2021-06-01 MED ORDER — FENTANYL CITRATE (PF) 100 MCG/2ML IJ SOLN
INTRAMUSCULAR | Status: AC
  Filled 2021-06-01: qty 2

## 2021-06-01 MED ORDER — KETAMINE HCL 50 MG/5ML IJ SOSY
PREFILLED_SYRINGE | INTRAMUSCULAR | Status: AC
  Filled 2021-06-01: qty 5

## 2021-06-01 MED ORDER — CEFAZOLIN SODIUM-DEXTROSE 2-4 GM/100ML-% IV SOLN
2.0000 g | Freq: Three times a day (TID) | INTRAVENOUS | Status: AC
  Administered 2021-06-01 – 2021-06-02 (×2): 2 g via INTRAVENOUS
  Filled 2021-06-01 (×2): qty 100

## 2021-06-01 MED ORDER — FENTANYL CITRATE (PF) 100 MCG/2ML IJ SOLN
25.0000 ug | INTRAMUSCULAR | Status: DC | PRN
  Administered 2021-06-01: 50 ug via INTRAVENOUS

## 2021-06-01 MED ORDER — SODIUM CHLORIDE 0.9 % IV SOLN: 500.0000 mL | Freq: Once | INTRAVENOUS | Status: DC | PRN

## 2021-06-01 MED ORDER — ONDANSETRON HCL 4 MG/2ML IJ SOLN
INTRAMUSCULAR | Status: AC
  Filled 2021-06-01: qty 2

## 2021-06-01 MED ORDER — MORPHINE SULFATE (PF) 2 MG/ML IV SOLN
INTRAVENOUS | Status: AC
  Filled 2021-06-01: qty 1

## 2021-06-01 MED ORDER — ACETAMINOPHEN 325 MG PO TABS: 325.0000 mg | ORAL_TABLET | ORAL | Status: DC | PRN

## 2021-06-01 MED ORDER — LIDOCAINE HCL (CARDIAC) PF 100 MG/5ML IV SOSY
PREFILLED_SYRINGE | INTRAVENOUS | Status: DC | PRN
  Administered 2021-06-01: 80 mg via INTRAVENOUS

## 2021-06-01 MED ORDER — KETAMINE HCL 10 MG/ML IJ SOLN
INTRAMUSCULAR | Status: DC | PRN
  Administered 2021-06-01: 20 mg via INTRAVENOUS
  Administered 2021-06-01: 30 mg via INTRAVENOUS

## 2021-06-01 MED ORDER — MORPHINE SULFATE (PF) 2 MG/ML IV SOLN
2.0000 mg | Freq: Once | INTRAVENOUS | Status: AC
  Administered 2021-06-01: 2 mg via INTRAVENOUS

## 2021-06-01 MED ORDER — ONDANSETRON HCL 4 MG/2ML IJ SOLN
INTRAMUSCULAR | Status: DC | PRN
  Administered 2021-06-01: 4 mg via INTRAVENOUS

## 2021-06-01 MED ORDER — HYDROMORPHONE HCL 1 MG/ML IJ SOLN
0.2500 mg | INTRAMUSCULAR | Status: DC | PRN
  Administered 2021-06-01: 0.5 mg via INTRAVENOUS

## 2021-06-01 MED ORDER — VISTASEAL 10 ML SINGLE DOSE KIT
PACK | CUTANEOUS | Status: AC
  Filled 2021-06-01: qty 20

## 2021-06-01 MED ORDER — FENTANYL CITRATE (PF) 100 MCG/2ML IJ SOLN
INTRAMUSCULAR | Status: DC | PRN
  Administered 2021-06-01 (×2): 50 ug via INTRAVENOUS

## 2021-06-01 MED ORDER — PHENYLEPHRINE HCL-NACL 20-0.9 MG/250ML-% IV SOLN
INTRAVENOUS | Status: AC
  Filled 2021-06-01: qty 250

## 2021-06-01 MED ORDER — NOREPINEPHRINE 4 MG/250ML-% IV SOLN
INTRAVENOUS | Status: AC
  Filled 2021-06-01: qty 250

## 2021-06-01 MED ORDER — 0.9 % SODIUM CHLORIDE (POUR BTL) OPTIME
TOPICAL | Status: DC | PRN
  Administered 2021-06-01: 500 mL

## 2021-06-01 MED ORDER — HYDROMORPHONE HCL 1 MG/ML IJ SOLN
INTRAMUSCULAR | Status: AC
  Filled 2021-06-01: qty 1

## 2021-06-01 MED ORDER — DEXAMETHASONE SODIUM PHOSPHATE 10 MG/ML IJ SOLN
INTRAMUSCULAR | Status: AC
  Filled 2021-06-01: qty 1

## 2021-06-01 MED ORDER — FENTANYL CITRATE (PF) 100 MCG/2ML IJ SOLN
INTRAMUSCULAR | Status: AC
  Administered 2021-06-01: 50 ug via INTRAVENOUS
  Filled 2021-06-01: qty 2

## 2021-06-01 MED ORDER — ENOXAPARIN SODIUM 40 MG/0.4ML IJ SOSY
40.0000 mg | PREFILLED_SYRINGE | INTRAMUSCULAR | Status: DC
  Administered 2021-06-03 – 2021-06-05 (×3): 40 mg via SUBCUTANEOUS
  Filled 2021-06-01 (×3): qty 0.4

## 2021-06-01 MED ORDER — HYDROMORPHONE HCL 1 MG/ML IJ SOLN
INTRAMUSCULAR | Status: DC | PRN
  Administered 2021-06-01: 1 mg via INTRAVENOUS

## 2021-06-01 MED ORDER — HYDROMORPHONE HCL 1 MG/ML IJ SOLN
0.5000 mg | INTRAMUSCULAR | Status: DC | PRN
  Administered 2021-06-01 – 2021-06-02 (×6): 1 mg via INTRAVENOUS
  Filled 2021-06-01 (×8): qty 1

## 2021-06-01 MED ORDER — OXYCODONE-ACETAMINOPHEN 5-325 MG PO TABS
1.0000 | ORAL_TABLET | ORAL | Status: DC | PRN
  Filled 2021-06-01: qty 2

## 2021-06-01 MED ORDER — PHENYLEPHRINE HCL (PRESSORS) 10 MG/ML IV SOLN
INTRAVENOUS | Status: AC
  Filled 2021-06-01: qty 1

## 2021-06-01 MED ORDER — HEPARIN SODIUM (PORCINE) 1000 UNIT/ML IJ SOLN
INTRAMUSCULAR | Status: AC
  Filled 2021-06-01: qty 20

## 2021-06-01 MED ORDER — ACETAMINOPHEN 10 MG/ML IV SOLN
INTRAVENOUS | Status: DC | PRN
Start: 2021-06-01 — End: 2021-06-01
  Administered 2021-06-01: 1000 mg via INTRAVENOUS

## 2021-06-01 MED ORDER — SORBITOL 70 % SOLN
30.0000 mL | Freq: Every day | Status: DC | PRN
  Filled 2021-06-01: qty 30

## 2021-06-01 MED ORDER — SUGAMMADEX SODIUM 200 MG/2ML IV SOLN
INTRAVENOUS | Status: DC | PRN
Start: 2021-06-01 — End: 2021-06-01
  Administered 2021-06-01: 200 mg via INTRAVENOUS

## 2021-06-01 MED ORDER — ESMOLOL HCL 100 MG/10ML IV SOLN
INTRAVENOUS | Status: DC | PRN
  Administered 2021-06-01: 20 mg via INTRAVENOUS

## 2021-06-01 MED ORDER — LACTATED RINGERS IV SOLN: INTRAVENOUS | Status: DC

## 2021-06-01 MED ORDER — VISTASEAL 10 ML SINGLE DOSE KIT
PACK | CUTANEOUS | Status: DC | PRN
  Administered 2021-06-01: 20 mL via TOPICAL

## 2021-06-01 MED ORDER — SENNOSIDES-DOCUSATE SODIUM 8.6-50 MG PO TABS
1.0000 | ORAL_TABLET | Freq: Every evening | ORAL | Status: DC | PRN
  Administered 2021-06-02: 1 via ORAL
  Filled 2021-06-01: qty 1

## 2021-06-01 MED ORDER — PHENYLEPHRINE 40 MCG/ML (10ML) SYRINGE FOR IV PUSH (FOR BLOOD PRESSURE SUPPORT)
PREFILLED_SYRINGE | INTRAVENOUS | Status: DC | PRN
  Administered 2021-06-01: 200 ug via INTRAVENOUS
  Administered 2021-06-01: 300 ug via INTRAVENOUS
  Administered 2021-06-01: 160 ug via INTRAVENOUS
  Administered 2021-06-01 (×2): 200 ug via INTRAVENOUS

## 2021-06-01 MED ORDER — HEPARIN SODIUM (PORCINE) 5000 UNIT/ML IJ SOLN
INTRAMUSCULAR | Status: AC
  Filled 2021-06-01: qty 1

## 2021-06-01 MED ORDER — ACETAMINOPHEN 650 MG RE SUPP
325.0000 mg | RECTAL | Status: DC | PRN
  Filled 2021-06-01: qty 1

## 2021-06-01 MED ORDER — MIDAZOLAM HCL 2 MG/2ML IJ SOLN
INTRAMUSCULAR | Status: DC | PRN
Start: 2021-06-01 — End: 2021-06-01
  Administered 2021-06-01: 2 mg via INTRAVENOUS

## 2021-06-01 MED ORDER — ONDANSETRON HCL 4 MG/2ML IJ SOLN: 4.0000 mg | Freq: Once | INTRAMUSCULAR | Status: DC | PRN

## 2021-06-01 MED ORDER — HEPARIN 30,000 UNITS/1000 ML (OHS) CELLSAVER SOLUTION
Status: AC
  Filled 2021-06-01: qty 1000

## 2021-06-01 MED ORDER — NOREPINEPHRINE BITARTRATE 1 MG/ML IV SOLN
INTRAVENOUS | Status: DC | PRN
  Administered 2021-06-01 (×6): 2 mL via INTRAVENOUS

## 2021-06-01 MED ORDER — NITROGLYCERIN IN D5W 200-5 MCG/ML-% IV SOLN: 5.0000 ug/min | INTRAVENOUS | Status: DC

## 2021-06-01 MED ORDER — PROPOFOL 10 MG/ML IV BOLUS
INTRAVENOUS | Status: AC
  Filled 2021-06-01: qty 20

## 2021-06-01 MED ORDER — NOREPINEPHRINE 4 MG/250ML-% IV SOLN
INTRAVENOUS | Status: DC | PRN
  Administered 2021-06-01: 2 ug/min via INTRAVENOUS

## 2021-06-01 MED ORDER — AMLODIPINE BESYLATE 5 MG PO TABS: 5.0000 mg | ORAL_TABLET | Freq: Every day | ORAL | Status: DC

## 2021-06-01 MED ORDER — HEMOSTATIC AGENTS (NO CHARGE) OPTIME
TOPICAL | Status: DC | PRN
  Administered 2021-06-01: 2 via TOPICAL

## 2021-06-01 MED ORDER — HYDRALAZINE HCL 20 MG/ML IJ SOLN
5.0000 mg | INTRAMUSCULAR | Status: DC | PRN
  Administered 2021-06-02: 5 mg via INTRAVENOUS
  Filled 2021-06-01: qty 1

## 2021-06-01 MED ORDER — SODIUM CHLORIDE FLUSH 0.9 % IV SOLN
INTRAVENOUS | Status: AC
  Filled 2021-06-01: qty 30

## 2021-06-01 MED ORDER — ACETAMINOPHEN 10 MG/ML IV SOLN
INTRAVENOUS | Status: AC
  Filled 2021-06-01: qty 100

## 2021-06-01 MED ORDER — VASOPRESSIN 20 UNIT/ML IV SOLN
INTRAVENOUS | Status: DC | PRN
  Administered 2021-06-01: 1 [IU] via INTRAVENOUS
  Administered 2021-06-01: 2 [IU] via INTRAVENOUS
  Administered 2021-06-01 (×2): 1 [IU] via INTRAVENOUS
  Administered 2021-06-01: 2 [IU] via INTRAVENOUS
  Administered 2021-06-01 (×3): 1 [IU] via INTRAVENOUS

## 2021-06-01 MED ORDER — CEFAZOLIN SODIUM 1 G IJ SOLR
INTRAMUSCULAR | Status: AC
  Filled 2021-06-01: qty 20

## 2021-06-01 MED ORDER — ROCURONIUM BROMIDE 100 MG/10ML IV SOLN
INTRAVENOUS | Status: DC | PRN
  Administered 2021-06-01: 20 mg via INTRAVENOUS
  Administered 2021-06-01: 50 mg via INTRAVENOUS
  Administered 2021-06-01: 30 mg via INTRAVENOUS
  Administered 2021-06-01: 50 mg via INTRAVENOUS
  Administered 2021-06-01: 30 mg via INTRAVENOUS
  Administered 2021-06-01: 10 mg via INTRAVENOUS

## 2021-06-01 MED ORDER — PHENOL 1.4 % MT LIQD
1.0000 | OROMUCOSAL | Status: DC | PRN
  Filled 2021-06-01: qty 177

## 2021-06-01 MED ORDER — HYDROMORPHONE HCL 1 MG/ML IJ SOLN
INTRAMUSCULAR | Status: AC
  Administered 2021-06-01: 0.5 mg via INTRAVENOUS
  Filled 2021-06-01: qty 1

## 2021-06-01 MED ORDER — MIDAZOLAM HCL 2 MG/2ML IJ SOLN
INTRAMUSCULAR | Status: AC
  Filled 2021-06-01: qty 2

## 2021-06-01 MED ORDER — ENOXAPARIN SODIUM 30 MG/0.3ML IJ SOSY: 30.0000 mg | PREFILLED_SYRINGE | INTRAMUSCULAR | Status: DC

## 2021-06-01 MED ORDER — METOPROLOL TARTRATE 5 MG/5ML IV SOLN
2.0000 mg | INTRAVENOUS | Status: DC | PRN
  Filled 2021-06-01: qty 5

## 2021-06-01 MED ORDER — SODIUM CHLORIDE 0.9 % IV SOLN: INTRAVENOUS | Status: DC

## 2021-06-01 MED ORDER — HEPARIN SODIUM (PORCINE) 1000 UNIT/ML IJ SOLN
INTRAMUSCULAR | Status: DC | PRN
  Administered 2021-06-01: 6000 [IU] via INTRAVENOUS

## 2021-06-01 SURGICAL SUPPLY — 80 items
"PENCIL ELECTRO HAND CTR " (MISCELLANEOUS) ×2 IMPLANT
BAG COUNTER SPONGE SURGICOUNT (BAG) ×3 IMPLANT
BAG DECANTER FOR FLEXI CONT (MISCELLANEOUS) ×3 IMPLANT
BLADE SURG 15 STRL LF DISP TIS (BLADE) ×2 IMPLANT
BLADE SURG 15 STRL SS (BLADE) ×1
BLADE SURG SZ11 CARB STEEL (BLADE) ×3 IMPLANT
BOOT SUTURE AID YELLOW STND (SUTURE) ×6 IMPLANT
BRUSH SCRUB EZ  4% CHG (MISCELLANEOUS) ×1
BRUSH SCRUB EZ 4% CHG (MISCELLANEOUS) ×2 IMPLANT
CHLORAPREP W/TINT 26 (MISCELLANEOUS) ×6 IMPLANT
COVER PROBE FLX POLY STRL (MISCELLANEOUS) ×3 IMPLANT
DERMABOND ADVANCED (GAUZE/BANDAGES/DRESSINGS) ×2
DERMABOND ADVANCED .7 DNX12 (GAUZE/BANDAGES/DRESSINGS) ×4 IMPLANT
DRAPE INCISE IOBAN 66X60 STRL (DRAPES) ×5 IMPLANT
DRESSING SURGICEL FIBRLLR 1X2 (HEMOSTASIS) ×4 IMPLANT
DRSG OPSITE POSTOP 4X10 (GAUZE/BANDAGES/DRESSINGS) ×3 IMPLANT
DRSG OPSITE POSTOP 4X12 (GAUZE/BANDAGES/DRESSINGS) ×3 IMPLANT
DRSG OPSITE POSTOP 4X6 (GAUZE/BANDAGES/DRESSINGS) ×6 IMPLANT
DRSG SURGICEL FIBRILLAR 1X2 (HEMOSTASIS) ×6
ELECT BLADE 6.5 EXT (BLADE) ×3 IMPLANT
ELECT CAUTERY BLADE 6.4 (BLADE) ×6 IMPLANT
ELECT REM PT RETURN 9FT ADLT (ELECTROSURGICAL) ×6
ELECTRODE REM PT RTRN 9FT ADLT (ELECTROSURGICAL) ×4 IMPLANT
GAUZE 4X4 16PLY ~~LOC~~+RFID DBL (SPONGE) ×3 IMPLANT
GLOVE SURG ENC MOIS LTX SZ7 (GLOVE) ×6 IMPLANT
GLOVE SURG SYN 8.0 (GLOVE) ×6 IMPLANT
GLOVE SURG SYN 8.0 PF PI (GLOVE) ×4 IMPLANT
GLOVE SURG UNDER LTX SZ7.5 (GLOVE) ×3 IMPLANT
GOWN STRL REUS W/ TWL LRG LVL3 (GOWN DISPOSABLE) ×4 IMPLANT
GOWN STRL REUS W/ TWL XL LVL3 (GOWN DISPOSABLE) ×8 IMPLANT
GOWN STRL REUS W/TWL LRG LVL3 (GOWN DISPOSABLE) ×2
GOWN STRL REUS W/TWL XL LVL3 (GOWN DISPOSABLE) ×4
GRAFT VASC HEMAGARD 12X6 BIF (Vascular Products) ×1 IMPLANT
HANDLE YANKAUER SUCT BULB TIP (MISCELLANEOUS) ×3 IMPLANT
IV CONNECTOR ONE LINK NDLESS (IV SETS) ×9 IMPLANT
KIT CV MULTILUMEN 7FR 20 (SET/KITS/TRAYS/PACK) ×3
KIT CV MULTILUMEN 7FR 20 SUB (SET/KITS/TRAYS/PACK) ×2 IMPLANT
KIT TURNOVER KIT A (KITS) ×3 IMPLANT
LABEL OR SOLS (LABEL) ×3 IMPLANT
LOOP RED MAXI  1X406MM (MISCELLANEOUS) ×7
LOOP VESSEL MAXI 1X406 RED (MISCELLANEOUS) ×8 IMPLANT
LOOP VESSEL MINI 0.8X406 BLUE (MISCELLANEOUS) ×4 IMPLANT
LOOPS BLUE MINI 0.8X406MM (MISCELLANEOUS) ×3
MANIFOLD NEPTUNE II (INSTRUMENTS) ×3 IMPLANT
NDL FILTER BLUNT 18X1 1/2 (NEEDLE) ×2 IMPLANT
NEEDLE FILTER BLUNT 18X 1/2SAF (NEEDLE) ×1
NEEDLE FILTER BLUNT 18X1 1/2 (NEEDLE) ×2 IMPLANT
NS IRRIG 1000ML POUR BTL (IV SOLUTION) ×1 IMPLANT
PACK BASIN MAJOR ARMC (MISCELLANEOUS) ×3 IMPLANT
PACK UNIVERSAL (MISCELLANEOUS) ×3 IMPLANT
PENCIL ELECTRO HAND CTR (MISCELLANEOUS) ×3 IMPLANT
RETAINER VISCERA MED (MISCELLANEOUS) ×3 IMPLANT
SPONGE T-LAP 18X18 ~~LOC~~+RFID (SPONGE) ×15 IMPLANT
SPONGE T-LAP 18X36 ~~LOC~~+RFID STR (SPONGE) ×3 IMPLANT
STAPLER SKIN PROX 35W (STAPLE) ×7 IMPLANT
SUT ETHIBOND 3 0 SH 1 (SUTURE) ×12 IMPLANT
SUT MNCRL+ 5-0 UNDYED PC-3 (SUTURE) ×4 IMPLANT
SUT MONOCRYL 5-0 (SUTURE) ×2
SUT PDS AB 1 TP1 96 (SUTURE) ×6 IMPLANT
SUT PROLENE 3 0 SH DA (SUTURE) ×12 IMPLANT
SUT PROLENE 4 0 SH DA (SUTURE) ×12 IMPLANT
SUT PROLENE 5 0 RB 1 DA (SUTURE) ×7 IMPLANT
SUT PROLENE 6 0 BV (SUTURE) ×22 IMPLANT
SUT PROLENE 7 0 BV 1 (SUTURE) ×5 IMPLANT
SUT SILK 0 (SUTURE) ×1
SUT SILK 0 30XBRD TIE 6 (SUTURE) ×2 IMPLANT
SUT SILK 2 0 (SUTURE) ×2
SUT SILK 2 0 SH (SUTURE) ×3 IMPLANT
SUT SILK 2-0 18XBRD TIE 12 (SUTURE) ×2 IMPLANT
SUT SILK 3 0 (SUTURE) ×2
SUT SILK 3-0 18XBRD TIE 12 (SUTURE) ×2 IMPLANT
SUT VIC AB 2-0 CT1 (SUTURE) ×13 IMPLANT
SUT VICRYL+ 3-0 36IN CT-1 (SUTURE) ×12 IMPLANT
SYR 20ML LL LF (SYRINGE) ×3 IMPLANT
SYR 3ML LL SCALE MARK (SYRINGE) ×3 IMPLANT
SYR BULB IRRIG 60ML STRL (SYRINGE) ×3 IMPLANT
TAPE UMBILICAL 1/8 X36 TWILL (MISCELLANEOUS) ×3 IMPLANT
TOWEL OR 17X26 4PK STRL BLUE (TOWEL DISPOSABLE) ×3 IMPLANT
TRAY FOLEY MTR SLVR 16FR STAT (SET/KITS/TRAYS/PACK) ×3 IMPLANT
WATER STERILE IRR 500ML POUR (IV SOLUTION) ×3 IMPLANT

## 2021-06-01 NOTE — Interval H&P Note (Signed)
History and Physical Interval Note:  06/01/2021 7:26 AM  Jeffrey Grant.  has presented today for surgery, with the diagnosis of Athroscopic with gangreen left foot.  The various methods of treatment have been discussed with the patient and family. After consideration of risks, benefits and other options for treatment, the patient has consented to  Procedure(s): AORTA BIFEMORAL BYPASS GRAFT (Bilateral) APPLICATION OF CELL SAVER (N/A) as a surgical intervention.  The patient's history has been reviewed, patient examined, no change in status, stable for surgery.  I have reviewed the patient's chart and labs.  Questions were answered to the patient's satisfaction.     [name]

## 2021-06-01 NOTE — Op Note (Signed)
OPERATIVE NOTE   PROCEDURE:    1.  Aortobifemoral bypass with 12 mm diameter proximal 6 mm diameter distal bifurcated Dacron graft 2.   Left common femoral and superficial femoral artery endarterectomies 3.   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 Aortic atherosclerosis 3.  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  Aortoiliac disease, significant plaque in bilateral common femoral and superficial femoral arteries  SPECIMEN(S):  Bilateral common femoral and superficial femoral artery plaque. Aortic plaque and thrombus  INDICATIONS:    Patient presents with rest pain and then progression to ulceration with known aortoiliac occlusion.  Aortobifemoral bypass is planned for revascularization for limb salvage.  The risks and benefits as well as alternative therapies including intervention were reviewed in detail all questions were answered the patient agrees to proceed with surgery. Co-surgeons are used to expedite the procedure and reduce operative time as bilateral work needs to be done.  DESCRIPTION: After obtaining full informed written consent, the patient was brought back to the operating room and placed supine upon the operating table.  The patient received IV antibiotics prior to induction.  After obtaining adequate anesthesia, the patient was prepped and draped in the standard fashion appropriate time out is called.  Initially a central line is placed and this will be dictated as a separate note.  With myself working on the left and Dr. Gilda Crease working on the right we began by dissecting out the femoral arteries on each side. Vertical  incisions were created overlying both femoral arteries. The common femoral artery proximally, and superficial femoral artery, and primary profunda femoris artery branches were encircled with vessel loops and prepared for control. Both femoral arteries were found to have significant plaque from the common femoral artery into the profunda and superficial femoral arteries.  The midline incision is then created and the dissection carried down to expose the fascia. The peritoneal cavity is entered without difficulty just below the xiphoid process. As the incision is extended to the supraumbilical area. . The omentum was then reflected superiorly and the small intestine is swept into the right gutter. Small intestine was then packed away with a large laparotomy pad which is moistened with saline.  The Omni-Tract retractor was then used to help facilitate our exposure.  The retroperitoneum is then opened along the midline overlying the pulse which is palpable at the level of the duodenum. The inferior mesenteric vein is identified and skeletonized so that it could be dissected out superiorly laterally out of the field. The renal vein is then identified. At this point the Omni-Tract is positioned to allow full exposure of the aorta.  The IMA was seen and was ligated and divided between silk ties.  The retroperitoneal dissection is then carried inferiorly past the palpable aortic bifurcation. Moving back superiorly the aorta is dissected circumferentially at the level of the renal vein it is soft at this level and acceptable for clamp. It is then dissected along its right side taking care not to injure the vena cava. The dissection is then carried down to the aortic bifurcation and right common iliac artery . Attention is then turned to the left common iliac which is dissected out and prepared for control.  At this point we proceeded with the tunneling maneuvers taking care to stay below the ureters which were  identified and protected from harm. Blunt dissection was used from the femoral incision up to the retroperitoneum and an umbilical tape was brought through the tunnel on each side and would later be exchanged for the graft.   6 units of heparin was given and allowed circulate for 5 minutes. An 12 mm x 6 mm  Dacron bifurcated graft is then selected and rehydrated on the back table. With myself working on the left side and Dr. Gilda Crease working on the right side the graft will be implanted.  The aorta was clamped just below the level of the right renal and an aortotomy is made with 11 blade and extended with Potts scissors the aortic contents are then removed under direct visualization. There was some significant thick aortic plaque that was removed. All loose contents were removed. The aorta was forward flushed. The Dacron graft was then beveled and applied in an end graft to side aortic anastomosis using running 3-0 Prolene. Flushing maneuvers were performed and flow was established back to the distal aorta the graft is clamped just above the suture line.    Then, using the umbilical tape as a guide, the left limb of the graft was tunneled in the retroperitoneal  to the left femoral incision. Care was taken to keep orientation with the line on the Dacron graft upward.  Attention is then turned to the left femoral artery.  An arteriotomy is made with 11 blade and extended with Potts scissors in the common femoral artery and carried down onto the first 3-4 cm of the left superficial femoral artery. An endarterectomy was then performed. The [facility] was used to create a plane. The proximal endpoint was cut flush with tenotomy scissors. This was in the mid common femoral artery.  A short segment eversion endarterectomy was then performed of the proximal left profunda femoris artery.  Good backbleeding was then seen. The distal endpoint of the superficial femoral endarterectomy was created with gentle  traction and the distal endpoint was somewhat tedious and down several centimeters due to the thick plaque.  This had to be tacked down with a pair of 7-0 Prolene sutures.  The left limb of the graft is then approximated to the arteriotomy, trimmed to an appropriate length and beveled and an end graft to side femoral artery anastomosis is fashioned including the graft over the origin of the professional femoral artery over its first 3-4 cm.  6-0 Prolene is used to sew the anastomosis. Flushing maneuvers were performed and flow was established first to the profunda femoris artery and then to the superficial femoral artery. Easily palpable pulses are noted well beyond the anastomosis and both arteries.  The right femoral artery is then addressed. Arteriotomy is made in the common femoral artery and extended down into the first 3 to 4 cm of the right superficial femoral artery. Similarly, an endarterectomy was performed with the [facility]. The proximal endpoint was cut flush with tenotomy scissors in the mid common femoral artery.  The profunda femoris artery was addressed and treated with an eversion endarterectomy and this was performed with a hemostat and gentle traction. The arteriotomy was carried down onto the proximal superficial femoral artery and the endarterectomy was continued to this point.  The plaque was fairly dense and extensive endarterectomy was required and the endpoint was cut flush with tenotomy scissors and the distal endpoint was tacked  down with a pair of 7-0 Prolene sutures. The graft was then cut and beveled to match the arteriotomy after tunneling this through the retroperitoneum. The anastomosis created in an end to side fashion with a 6-0 Prolene suture . Flushing maneuvers were performed and flow was reestablished to the femoral vessels. Excellent pulses noted in the righ superficial femoral and profunda femoris artery below the femoral anastomosis.  The retroperitoneal tissues  are then irrigated with sterile saline and inspected for hemostasis Evicel with Surgicel is placed and the retroperitoneal tissues are reapproximated using running 0 Vicryl suture. The viscera was returned to its anatomic location and subsequently the fascia is closed with looped #1 PDS skin is closed with staples.  Fibrillar and Vistacel topical hemostatic agents were placed in the femoral incisions and hemostasis was complete. The femoral incisions were then closed in a layered fashion with 2 layers of 2-0 Vicryl, 2 layers of 3-0 Vicryl, and staples for the skin closure. Dermabond and sterile dressing were then placed over all incisions.  The patient was then awakened from anesthesia and taken to the recovery room in stable condition having tolerated the procedure well.  COMPLICATIONS: None  CONDITION: Stable   This note was created with Dragon Medical transcription system. Any errors in dictation are purely unintentional.

## 2021-06-01 NOTE — Op Note (Signed)
OPERATIVE NOTE   PROCEDURE: Aortobifemoral bypass grafting with 12 x 6 bifurcated dacryon graft 2.   Right common femoral endarterectomy Right superficial femoral endarterectomy Left common femoral endarterectomy Left superficial femoral endarterectomy   PRE-OPERATIVE DIAGNOSIS: 1.Atherosclerotic occlusive disease bilateral lower extremities with bilateral rest pain and ulcerations of the left foot 2. Aortic and iliac occlusions  POST-OPERATIVE DIAGNOSIS: Same  SURGEON: Ladaisha Portillo, Latina Craver CO-surgeon:  Festus Barren, M.D.   ANESTHESIA:  general  ESTIMATED BLOOD LOSS: 250 cc  FINDING(S): 1.  Severe atherosclerotic changes to the aorta bilateral common and external iliac arteries as well as the bilateral common femoral pand superficial femoral arteries.   SPECIMEN(S):  Plaque bilateral femoral artery systems  INDICATIONS:     Jeffrey Grant. is a 40 y.o. y.o. male who presents with ischemic rest pain as well as blisters and ulceration of the left foot.  Angiography has been performed and his situation is not amenable to interventional techniques therefore aortobifemoral bypass grafting has been recommended.  The risks and benefits as well as alternative therapies including intervention were reviewed in detail all questions were answered the patient agrees to proceed with surgery.  DESCRIPTION: After obtaining full informed written consent, the patient was brought back to the operating room and placed supine upon the operating table.  The patient received IV antibiotics prior to induction.  After obtaining adequate anesthesia, the patient was prepped and draped in the standard fashion appropriate time out is called.  Initially a central line is placed and this will be dictated as a separate note.  Co-surgeons are required because this is a complex bilateral procedure with work being performed simultaneously from both the patient's right and left sides.  This also expedites the procedure  making a shorter operative time reducing complications and improving patient safety.  Working with myself on the right side with the patient and Dr. Wyn Quaker on the patient's left side, vertical incisions are made in both groins and the dissection is carried down to expose the femoral sheath. Femoral sheath is entered and the common femoral arteries identified. Dissection is then carried proximally to a level 1-2 cm above the ileo-inguinal ligament. The proximal common femoral artery is then looped with a Silastic vessel loop. The superficial femoral artery is then exposed for a distance of approximately 3-4 cm and looped with a Silastic vessel loop. The profunda femoris artery is then exposed and looped with Silastic vessel loop. This is accomplished on both right and left sides. Blunt dissection is then used along the anterior wall of the artery to fashion a tunnel for the bypass graft. Attention is then turned to the abdomen  The midline incision is then created and the dissection carried down to expose the fascia. The peritoneal cavity is entered without difficulty just below the xiphoid process. As the incision is extended to the pubis. The omentum was then reflected superiorly and the small intestine is swept into the right gutter. Small intestine was then packed away with a large laparotomy pad which is moistened with saline.  The Omni-Tract retractor was then used to help facilitate our exposure.  The retroperitoneum is then opened along the midline overlying the pulse which is palpable at the level of the duodenum. The inferior mesenteric vein is identified and skeletonized so that he is mobilized and able to be retracted superiorly and left laterally out of the field. The renal vein is then identified. At this point the Omni-Tract is positioned to allow full exposure of the  aorta.  The retroperitoneal dissection is then carried inferiorly past the palpable aortic bifurcation. Moving back superiorly the  aorta is dissected circumferentially at the level of the renal vein it is soft at this level and acceptable for clamp. It is then dissected along its right side taking care not to injure the vena cava. The dissection is then carried down to the right common iliac. Attention is then turned to the left common iliac which is dissected. Tunnels were then fashioned using blunt dissection along the anterior margin of the iliac arteries extending out to the groin incisions. And subsequently umbilical tapes are placed both right and left side.  6 000 units of heparin was given and allowed circulate for 5 minutes. An 12 x 6 Dacron bifurcated graft is then selected and rehydrated on the back table. With myself working on the right side and Dr. Wyn Quaker working on the left side the graft will be implanted.  The aorta was clamped just below the level of the right renal and an aortotomy is made with 11 blade and extended with Potts scissors the aortic contents are then removed under direct visualization. The aorta was forward flushed. The Dacron graft was then beveled and applied in an end graft to side aortic anastomosis using running 4-0 Prolene. Flushing maneuvers were performed and flow was established back to the distal aorta the graft is clamped just above the suture line.   Working on the left side first the right limb of the graft is pulled through the tunnel. It is then reflected superiorly out of the way.  Attention is then turned to the left common femoral artery.  An arteriotomy is made with 11 blade and extended with Potts scissors onto the superficial femoral artery. Endarterectomy is then performed with a Therapist, nutritional under direct visualization extending from the common femoral down into the superficial femoral artery. The left profunda femoris artery is then treated with endarterectomy using the eversion technique.  Interrupted 7-0 Prolene sutures were used to tack the intimal edge in the superficial femoral  artery.  The left limb of the graft is then approximated to the arteriotomy trimmed to an appropriate bevel and an end graft to side common and superficial femoral artery anastomosis is fashioned. 5-0 Prolene is used to sew the anastomosis. Flushing maneuvers were performed and flow was established first to the common femoral in a retrograde fashion and then the profunda femoris and superficial femoral. Easily palpable pulses are noted in the profunda femoris distal to the anastomosis as well as in the superficial femoral artery distal to the anastomosis. The graft itself has an excellent pulse.  Attention is then turned to the right common femoral artery.  An arteriotomy is made with 11 blade and extended with Potts scissors onto the superficial femoral artery. Endarterectomy is then performed with a Therapist, nutritional under direct visualization extending from the common femoral down into the superficial femoral artery. 7-0 Prolene sutures are used to tack the distal intimal edge in an interrupted fashion.  The right limb of the graft is then approximated to the arteriotomy trimmed to an appropriate bevel and an end graft to side common femoral and superficial femoral artery anastomosis is fashioned. 5-0 Prolene is used to sew the anastomosis. Flushing maneuvers were performed and flow was established first to the common femoral in a retrograde fashion and then the profunda femoris and superficial femoral arteries. Easily palpable pulses are noted in the profunda femoris distal to the anastomosis as well as  in the superficial femoral artery several centimeters distal to the anastomosis. The graft itself has an excellent pulse.  The retroperitoneal tissues are then irrigated with sterile saline and inspected for hemostasis Evicel with Surgicel is placed and the retroperitoneal tissues are reapproximated using running 0 Vicryl suture. The viscera was returned to its anatomic location and subsequently the fascia is  closed with looped #1 PDS.  Attention is then turned to both groin incisions which were irrigated AdvaSeal and Surgicel are placed and the groins are reapproximated using a total of 4 layers of Vicryl with 2 layers of 2-0 Vicryl in a running fashion followed by 2 layers of 3-0 Vicryl in a running fashion followed by 4-0 Monocryl subcuticular. The abdominal incision is closed with 4-0 Monocryl subcuticular.   COMPLICATIONS: None  CONDITION: Jeffrey Grant 12/28/2014 4:18 PM

## 2021-06-01 NOTE — Anesthesia Preprocedure Evaluation (Addendum)
Anesthesia Evaluation  Patient identified by MRN, date of birth, ID band Patient awake    Reviewed: Allergy & Precautions, H&P , NPO status , Patient's Chart, lab work & pertinent test results  History of Anesthesia Complications Negative for: history of anesthetic complications  Airway Mallampati: II  TM Distance: >3 FB Neck ROM: limited    Dental no notable dental hx.    Pulmonary shortness of breath, sleep apnea , neg COPD, Current Smoker and Patient abstained from smoking.,    breath sounds clear to auscultation       Cardiovascular hypertension, (-) angina+ Peripheral Vascular Disease  (-) Past MI and (-) Cardiac Stents (-) dysrhythmias  Rhythm:regular Rate:Normal  Bilateral lower extremity rest pain and ulceration to the left foot.  Unable to revascularize the bilateral lower extremity via angiogram due to the severity of his disease  Posted for an aortobifem with Dr. Angelica Pou  Cardiology note:  "Lexiscan infusion Myoview and echocardiogram for risk stratification revealed no structural, functional, valvular abnormalities. Patient is considered medically optimized from a cardiac standpoint for surgery"   Neuro/Psych PSYCHIATRIC DISORDERS Anxiety Depression Schizophrenia Cervical radiculopathy    GI/Hepatic negative GI ROS, (+)     substance abuse  marijuana use,   Endo/Other  negative endocrine ROS  Renal/GU      Musculoskeletal  (+) Arthritis ,   Abdominal   Peds  Hematology negative hematology ROS (+)   Anesthesia Other Findings Past Medical History: No date: Anxiety No date: Atherosclerotic PVD with intermittent claudication (HCC) No date: Back pain No date: Collagen vascular disease (HCC) No date: Dyspnea No date: Dysrhythmia No date: Elevated lipids No date: Hypertension No date: Nausea No date: Seropositive rheumatoid arthritis (HCC)  Past Surgical History: 04/21/2018: COLONOSCOPY WITH  PROPOFOL; N/A     Comment:  Procedure: COLONOSCOPY WITH PROPOFOL;  Surgeon: [name], MD;  Location: [facility] ENDOSCOPY;  Service:               Gastroenterology;  Laterality: N/A; 04/21/2018: ESOPHAGOGASTRODUODENOSCOPY (EGD) WITH PROPOFOL; N/A     Comment:  Procedure: ESOPHAGOGASTRODUODENOSCOPY (EGD) WITH               PROPOFOL;  Surgeon: [name], MD;  Location: [facility]               ENDOSCOPY;  Service: Gastroenterology;  Laterality: N/A; No date: HIP PINNING; Right 05/29/2021: LOWER EXTREMITY ANGIOGRAPHY; N/A     Comment:  Procedure: LOWER EXTREMITY ANGIOGRAPHY;  Surgeon:               Renford Dills, MD;  Location: [facility] INVASIVE CV LAB;               Service: Cardiovascular;  Laterality: N/A; 10/22/2016: TOTAL HIP ARTHROPLASTY; Right     Comment:  Procedure: TOTAL HIP ARTHROPLASTY ANTERIOR APPROACH;                Surgeon: Kennedy Bucker, MD;  Location: [facility] ORS;  Service:              Orthopedics;  Laterality: Right;  BMI    Body Mass Index: 24.48 kg/m      Reproductive/Obstetrics negative OB ROS                            Anesthesia Physical Anesthesia Plan  ASA: 3  Anesthesia Plan: General ETT  Post-op Pain Management:    Induction: Intravenous  PONV Risk Score and Plan: Ondansetron, Dexamethasone, Midazolam and Treatment may vary due to age or medical condition  Airway Management Planned: Oral ETT  Additional Equipment: Arterial line  Intra-op Plan:   Post-operative Plan:   Informed Consent: I have reviewed the patients History and Physical, chart, labs and discussed the procedure including the risks, benefits and alternatives for the proposed anesthesia with the patient or authorized representative who has indicated his/her understanding and acceptance.     Dental Advisory Given  Plan Discussed with: Anesthesiologist, CRNA and Surgeon  Anesthesia Plan Comments: (Discussed risks and benefits of anesthesia.  All  questions answered and patient provides consent to proceed.)        Anesthesia Quick Evaluation

## 2021-06-01 NOTE — Anesthesia Procedure Notes (Signed)
Arterial Line Insertion Start/End12/02/2021 8:05 AM, 06/01/2021 8:08 AM Performed by: Karleen Hampshire, MD, anesthesiologist  Patient location: Pre-op. Preanesthetic checklist: patient identified, IV checked, site marked, risks and benefits discussed, surgical consent, monitors and equipment checked, pre-op evaluation, timeout performed and anesthesia consent Patient sedated Right, radial was placed Catheter size: 20 G Hand hygiene performed   Attempts: 1 Procedure performed using ultrasound guided technique. Ultrasound Notes:anatomy identified Following insertion, dressing applied. Post procedure assessment: normal and unchanged  Patient tolerated the procedure well with no immediate complications.

## 2021-06-01 NOTE — Anesthesia Procedure Notes (Signed)
Procedure Name: Intubation Date/Time: 06/01/2021 8:03 AM Performed by: Henrietta Hoover, CRNA Pre-anesthesia Checklist: Patient identified, Emergency Drugs available, Suction available and Patient being monitored Patient Re-evaluated:Patient Re-evaluated prior to induction Oxygen Delivery Method: Circle system utilized Preoxygenation: Pre-oxygenation with 100% oxygen Induction Type: IV induction Ventilation: Mask ventilation without difficulty Laryngoscope Size: McGraph and 4 Grade View: Grade I Tube type: Oral Tube size: 7.5 mm Number of attempts: 1 Airway Equipment and Method: Stylet and Video-laryngoscopy Placement Confirmation: ETT inserted through vocal cords under direct vision, positive ETCO2 and breath sounds checked- equal and bilateral Secured at: 22 cm Tube secured with: Tape Dental Injury: Teeth and Oropharynx as per pre-operative assessment

## 2021-06-01 NOTE — Transfer of Care (Signed)
Immediate Anesthesia Transfer of Care Note  Patient: Jeffrey Grant.  Procedure(s) Performed: AORTA BIFEMORAL BYPASS GRAFT (Bilateral) APPLICATION OF CELL SAVER  Patient Location: PACU  Anesthesia Type:General  Level of Consciousness: awake, drowsy and patient cooperative  Airway & Oxygen Therapy: Patient Spontanous Breathing  Post-op Assessment: Report given to RN and Post -op Vital signs reviewed and stable  Post vital signs: Reviewed and stable  Last Vitals:  Vitals Value Taken Time  BP 143/89 06/01/21 1315  Temp    Pulse 115 06/01/21 1318  Resp 20 06/01/21 1318  SpO2 100 % 06/01/21 1318  Vitals shown include unvalidated device data.  Last Pain:  Vitals:   06/01/21 0656  TempSrc: Temporal  PainSc: 10-Worst pain ever      Patients Stated Pain Goal: 0 (06/01/21 0656)  Complications: No notable events documented.

## 2021-06-01 NOTE — Anesthesia Postprocedure Evaluation (Signed)
Anesthesia Post Note  Patient: Jeffrey Grant.  Procedure(s) Performed: AORTA BIFEMORAL BYPASS GRAFT (Bilateral) APPLICATION OF CELL SAVER  Patient location during evaluation: PACU Anesthesia Type: General Level of consciousness: awake and alert Pain management: pain level controlled Vital Signs Assessment: post-procedure vital signs reviewed and stable Respiratory status: spontaneous breathing, nonlabored ventilation, respiratory function stable and patient connected to nasal cannula oxygen Cardiovascular status: blood pressure returned to baseline and stable Postop Assessment: no apparent nausea or vomiting Anesthetic complications: no   No notable events documented.   Last Vitals:  Vitals:   06/01/21 1415 06/01/21 1437  BP:  (!) 149/105  Pulse: 88 88  Resp: 10 14  Temp:  (!) 36.4 C  SpO2: 99% 99%    Last Pain:  Vitals:   06/01/21 1437  TempSrc: Oral  PainSc: 10-Worst pain ever                 Karleen Hampshire

## 2021-06-01 NOTE — Op Note (Signed)
OPERATIVE NOTE   PROCEDURE: Insertion of non-tunneled dialysis catheter right IJ approach same venous access.  PRE-OPERATIVE DIAGNOSIS: Atherosclerotic occlusive disease bilateral lower extremities with rest pain and ulceration of the left foot  POST-OPERATIVE DIAGNOSIS: Same  SURGEON: Hesper Venturella, Latina Craver  ESTIMATED BLOOD LOSS: Minimal cc  CONTRAST USED:  None  INDICATIONS:   [name]. 40 y.o. male who presents with atherosclerotic occlusive disease bilateral lower extremities with ulceration left foot.  He is undergoing an aortobifemoral bypass graft and therefore needs appropriate parenteral access.  Risks and benefits for central line placement are reviewed all questions were answered patient agrees to proceed.  DESCRIPTION: After obtaining full informed written consent, the patient was positioned supine. The right neck and chest wall was prepped and draped in a sterile fashion.  Ultrasound is placed in a sterile sleeve.  Ultrasound is utilized to identify the internal jugular vein which is echolucent and compressible indicating patency.  Images recorded for the permanent record.  Under direct ultrasound visualization Seldinger needle is inserted into the jugular vein.  J-wire was advanced.  Dilator is passed over the wire.  Triple-lumen catheter was then fed over the wire without difficulty.  All lumens aspirate and flush well.  Catheter secured to the skin of the right neck with 2-0 silk. A sterile dressing is applied with a Biopatch.  COMPLICATIONS: None  CONDITION: Velna Hatchet  [facility] Office:  [phone]  06/01/2021, 8:53 AM

## 2021-06-01 NOTE — Consult Note (Signed)
NAME:  Jeffrey Grant, Jeffrey Grant MRN:  [id number], DOB:  Oct 25, 1980, LOS: 3 ADMISSION DATE:  05/29/2021, CONSULTATION DATE:  06/01/2021 REFERRING MD:  Dr. Gilda Crease, CHIEF COMPLAINT:  Bilateral LE rest pain   Brief Pt Description / Synopsis:  40 y.o Male admitted with Severe Bilateral Peripheral Artery Disease with rest pain and ulceration of the left foot, s/p Aortobifemoral bypass Graft, and Bilateral femoral artery endarterectomies on 06/01/21.  History of Present Illness:  Jeffrey Grant is a 40 year old male with a past medical history significant for hypertension, hyperlipidemia, severe peripheral vascular disease, back pain, and rheumatoid arthritis who presented to [facility] on 05/29/2021 for elective bilateral lower extremity angiogram with Vascular Surgery.  Patient follows with [facility], and was found to have bilateral extremity rest pain along with ulceration of the left foot.  Angiogram was unsuccessful, unable to revascularize due to the severity of disease.  Tentative plan was for aortobifemoral bypass on Friday.  Cardiology was consulted for cardiac clearance prior to surgery.  On 06/01/2021 he underwent the aortobifemoral bypass graft along with bilateral femoral endarterectomies.  He is transferred to ICU post procedure.  Noted to have significant pain and severe hypertension.  PCCM is consulted for medical management and pain management while in ICU.  Pertinent  Medical History  Hypertension Hyperlipidemia PVD Back pain Rheumatoid Arthritis  Micro Data:  05/29/2021: SARS-CoV-2 & Influenza PCR >> negative  Antimicrobials:  Cefazolin 12/9 (surgical prophylaxis, ordered for 2 doses)  Significant Hospital Events: Including procedures, antibiotic start and stop dates in addition to other pertinent events   12/6: Underwent elective Angiogram, unable to revascularize due to severity of disease.  Plan for Aortobifemoral bypass on Friday. 12/7: Cardiology  consulted for Cardiac clearance for procedure 12/9: Underwent aortobifemoral bypass graft, returns to ICU post procedure, PCCM consulted for medical and pain management while in ICU  Interim History / Subjective:  -Underwent aortobifemoral bypass And bilateral femoral artery endarterectomies by vascular surgery -Transferred to ICU postprocedure -Hypertensive with systolic blood pressure in the 190s, complaining of severe abdominal pain and nausea -Awake and alert, on room air -PCCM consulted for medical and pain management while in ICU  Objective   Blood pressure (!) 149/105, pulse 88, temperature (!) 97.5 F (36.4 C), temperature source Oral, resp. rate 14, height 5\' 8"  (1.727 m), weight 73 kg, SpO2 99 %.        Intake/Output Summary (Last 24 hours) at 06/01/2021 1522 Last data filed at 06/01/2021 1424 Gross per 24 hour  Intake 4014.2 ml  Output 1115 ml  Net 2899.2 ml   Filed Weights   05/29/21 0728 06/01/21 0656  Weight: 72.6 kg 73 kg    Examination: General: Acutely ill-appearing male, laying in bed, on room air, in no acute distress HENT: Atraumatic, normocephalic, neck supple, no JVD Lungs: Clear breath sounds bilaterally, no wheezing or rales noted, even, nonlabored Cardiovascular: Regular rate and rhythm, S1-S2, no murmurs, rubs, gallops Abdomen: Tender, soft, nondistended, no guarding rebound tenderness, hypoactive bowel sounds Extremities: Normal bulk and tone, no deformities, no edema Neuro: Awake and alert, oriented x4, follows commands, no focal deficits GU: Foley catheter in place draining yellow urine  Resolved Hospital Problem list     Assessment & Plan:   Severe Bilateral Peripheral Artery Disease with rest pain and ulceration of the left foot -Vascular Surgery following, appreciate input -S/p Aortobifemoral bypass Graft, and Bilateral femoral artery endarterectomies -Post-op management/assessment as per Vascular -Pain control with Prn Dilaudid and  Percocet ~  if uncontrolled may need to consider PCA  Hypertension -Continuous cardiac monitoring -Maintain SBP <160 -Prn Hydralazine -Clevidipine gtt if needed -Can resume home Norvasc and Lisinopril tomorrow  Acute Blood Loss Anemia (EBL 250 cc intra-op) -Monitor for S/Sx of bleeding -Trend CBC -SCD's for VTE Prophylaxis (Lovenox to begin 12/10)  -Transfuse for Hgb <8  Mild Leukocytosis, suspect reactive -Monitor fever curve -Trend WBC's       Best Practice (right click and "Reselect all SmartList Selections" daily)   Diet/type: NPO DVT prophylaxis: LMWH (to resume in am 12/10) GI prophylaxis: H2B Lines: Central line and yes and it is still needed Foley:  Yes, and it is still needed Code Status:  full code Last date of multidisciplinary goals of care discussion [N/A]  Labs   CBC: Recent Labs  Lab 05/29/21 1607 05/30/21 0723 05/31/21 0552 06/01/21 0204  WBC 7.6 8.0 8.0 8.5  HGB 13.1 13.0 13.8 13.6  HCT 40.5 38.7* 41.1 41.0  MCV 88.8 88.4 87.3 87.2  PLT 292 258 [id number]    Basic Metabolic Panel: Recent Labs  Lab 05/29/21 0714 05/30/21 0723 06/01/21 0204  NA  --  135 137  K  --  4.7 4.5  CL  --  100 100  CO2  --  27 28  GLUCOSE  --  116* 134*  BUN 27* 17 20  CREATININE 0.83 0.61 0.76  CALCIUM  --  9.5 9.6  MG  --   --  2.5*   GFR: Estimated Creatinine Clearance: 117.6 mL/min (by C-G formula based on SCr of 0.76 mg/dL). Recent Labs  Lab 05/29/21 1607 05/30/21 0723 05/31/21 0552 06/01/21 0204  WBC 7.6 8.0 8.0 8.5    Liver Function Tests: No results for input(s): AST, ALT, ALKPHOS, BILITOT, PROT, ALBUMIN in the last 168 hours. No results for input(s): LIPASE, AMYLASE in the last 168 hours. No results for input(s): AMMONIA in the last 168 hours.  ABG    Component Value Date/Time   PHART 7.33 (L) 06/01/2021 1059   PCO2ART 42 06/01/2021 1059   PO2ART 169 (H) 06/01/2021 1059   HCO3 22.2 06/01/2021 1059   ACIDBASEDEF 3.9 (H) 06/01/2021 1059    O2SAT 99.5 06/01/2021 1059     Coagulation Profile: Recent Labs  Lab 05/29/21 1607 06/01/21 0204  INR 1.0 0.9    Cardiac Enzymes: No results for input(s): CKTOTAL, CKMB, CKMBINDEX, TROPONINI in the last 168 hours.  HbA1C: No results found for: HGBA1C  CBG: Recent Labs  Lab 06/01/21 1313 06/01/21 1433  GLUCAP 171* 171*    Review of Systems:   Positives in BOLD: Gen: Denies fever, chills, weight change, fatigue, night sweats HEENT: Denies blurred vision, double vision, hearing loss, tinnitus, sinus congestion, rhinorrhea, sore throat, neck stiffness, dysphagia PULM: Denies shortness of breath, cough, sputum production, hemoptysis, wheezing CV: Denies chest pain, edema, orthopnea, paroxysmal nocturnal dyspnea, palpitations GI: Denies abdominal pain, nausea, vomiting, diarrhea, hematochezia, melena, constipation, change in bowel habits GU: Denies dysuria, hematuria, polyuria, oliguria, urethral discharge Endocrine: Denies hot or cold intolerance, polyuria, polyphagia or appetite change Derm: Denies rash, dry skin, scaling or peeling skin change Heme: Denies easy bruising, bleeding, bleeding gums Neuro: Denies headache, numbness, weakness, slurred speech, loss of memory or consciousness   Past Medical History:  He,  has a past medical history of Anxiety, Atherosclerotic PVD with intermittent claudication ([place]), Back pain, Collagen vascular disease ([place]), Dyspnea, Dysrhythmia, Elevated lipids, Hypertension, Nausea, and Seropositive rheumatoid arthritis ([place]).   Surgical History:   Past  Surgical History:  Procedure Laterality Date   COLONOSCOPY WITH PROPOFOL N/A 04/21/2018   Procedure: COLONOSCOPY WITH PROPOFOL;  Surgeon: Jonathon Bellows, MD;  Location: [facility] ENDOSCOPY;  Service: Gastroenterology;  Laterality: N/A;   ESOPHAGOGASTRODUODENOSCOPY (EGD) WITH PROPOFOL N/A 04/21/2018   Procedure: ESOPHAGOGASTRODUODENOSCOPY (EGD) WITH PROPOFOL;  Surgeon: Jonathon Bellows, MD;  Location:  [facility] ENDOSCOPY;  Service: Gastroenterology;  Laterality: N/A;   HIP PINNING Right    LOWER EXTREMITY ANGIOGRAPHY N/A 05/29/2021   Procedure: LOWER EXTREMITY ANGIOGRAPHY;  Surgeon: Katha Cabal, MD;  Location: [place] CV LAB;  Service: Cardiovascular;  Laterality: N/A;   TOTAL HIP ARTHROPLASTY Right 10/22/2016   Procedure: TOTAL HIP ARTHROPLASTY ANTERIOR APPROACH;  Surgeon: Hessie Knows, MD;  Location: [facility] ORS;  Service: Orthopedics;  Laterality: Right;     Social History:   reports that he has been smoking cigarettes. He has a 4.00 pack-year smoking history. He has never used smokeless tobacco. He reports that he does not currently use drugs. He reports that he does not drink alcohol.   Family History:  His family history is not on file.   Allergies No Known Allergies   Home Medications  Prior to Admission medications   Medication Sig Start Date End Date Taking? Authorizing Provider  acetaminophen (TYLENOL) 500 MG tablet Take 1,000 mg by mouth every 6 (six) hours as needed (pain.).   Yes [provider]  amLODipine (NORVASC) 5 MG tablet Take 5 mg by mouth in the morning.   Yes [provider]  cyclobenzaprine (FLEXERIL) 10 MG tablet Take 10 mg by mouth every 8 (eight) hours as needed for muscle spasms. 05/24/21  Yes [provider]  HYDROcodone-acetaminophen (NORCO/VICODIN) 5-325 MG tablet Take 1 tablet by mouth every 6 (six) hours as needed (pain.). 05/21/21  Yes [provider]  lisinopril (ZESTRIL) 20 MG tablet Take 20 mg by mouth daily.   Yes [provider]  LYRICA 100 MG capsule Take 100 mg by mouth 3 (three) times daily. 05/21/21  Yes [provider]  meloxicam (MOBIC) 15 MG tablet Take 15 mg by mouth in the morning.   Yes [provider]  NEURONTIN 300 MG capsule Take 600 mg by mouth 2 (two) times daily. 04/05/21  Yes [provider]  nicotine (NICODERM CQ - DOSED IN MG/24 HOURS) 21 mg/24hr patch  Place 21 mg onto the skin daily.   Yes [provider]  nicotine polacrilex (NICORETTE) 4 MG gum Take 4 mg by mouth as needed for smoking cessation.   Yes [provider]  Vitamin D, Ergocalciferol, (DRISDOL) 50000 units CAPS capsule Take 50,000 Units by mouth every Thursday.   Yes [provider]     Care time: 40 minutes     Darel Hong, AGACNP-BC [facility] epic messenger for cross cover needs If after hours, please call E-link

## 2021-06-01 NOTE — Progress Notes (Signed)
   06/01/21 0600  Clinical Encounter Type  Visited With Patient and family together  Visit Type Follow-up;Spiritual support  Referral From Chaplain  Consult/Referral To Chaplain  Spiritual Encounters  Spiritual Needs Prayer;Emotional  Chaplain Oleta Mouse did a follow up visit with Jeffrey Grant for prayer and emotional support. Chaplain provided prayer and encouraging words with he and his wife who was by his bedside.

## 2021-06-02 ENCOUNTER — Encounter
Admission: RE | Disposition: A | Discharge status: Home Health | Payer: Self-pay | Source: Home / Self Care | Attending: Vascular Surgery

## 2021-06-02 ENCOUNTER — Inpatient Hospital Stay: Payer: Medicaid Other

## 2021-06-02 ENCOUNTER — Encounter: Payer: Self-pay | Admitting: Vascular Surgery

## 2021-06-02 ENCOUNTER — Inpatient Hospital Stay: Payer: Medicaid Other | Admitting: Anesthesiology

## 2021-06-02 DIAGNOSIS — I70219 Atherosclerosis of native arteries of extremities with intermittent claudication, unspecified extremity: Secondary | ICD-10-CM

## 2021-06-02 DIAGNOSIS — I70299 Other atherosclerosis of native arteries of extremities, unspecified extremity: Secondary | ICD-10-CM | POA: Diagnosis not present

## 2021-06-02 DIAGNOSIS — L97909 Non-pressure chronic ulcer of unspecified part of unspecified lower leg with unspecified severity: Secondary | ICD-10-CM | POA: Diagnosis not present

## 2021-06-02 HISTORY — PX: ENDARTERECTOMY POPLITEAL: SHX5806

## 2021-06-02 LAB — MAGNESIUM: Magnesium: 2 mg/dL (ref 1.7–2.4)

## 2021-06-02 LAB — CBC
HCT: 32.3 % — ABNORMAL LOW (ref 39.0–52.0)
Hemoglobin: 11 g/dL — ABNORMAL LOW (ref 13.0–17.0)
MCH: 29.1 pg (ref 26.0–34.0)
MCHC: 34.1 g/dL (ref 30.0–36.0)
MCV: 85.4 fL (ref 80.0–100.0)
Platelets: 195 10*3/uL (ref 150–400)
RBC: 3.78 MIL/uL — ABNORMAL LOW (ref 4.22–5.81)
RDW: 14.7 % (ref 11.5–15.5)
WBC: 14.5 10*3/uL — ABNORMAL HIGH (ref 4.0–10.5)
nRBC: 0 % (ref 0.0–0.2)

## 2021-06-02 LAB — BASIC METABOLIC PANEL
Anion gap: 7 (ref 5–15)
BUN: 15 mg/dL (ref 6–20)
CO2: 26 mmol/L (ref 22–32)
Calcium: 8.5 mg/dL — ABNORMAL LOW (ref 8.9–10.3)
Chloride: 101 mmol/L (ref 98–111)
Creatinine, Ser: 0.75 mg/dL (ref 0.61–1.24)
GFR, Estimated: >60 mL/min (ref >60)
Glucose, Bld: 148 mg/dL — ABNORMAL HIGH (ref 70–99)
Potassium: 4.1 mmol/L (ref 3.5–5.1)
Sodium: 134 mmol/L — ABNORMAL LOW (ref 135–145)

## 2021-06-02 LAB — PHOSPHORUS: Phosphorus: 3.8 mg/dL (ref 2.5–4.6)

## 2021-06-02 IMAGING — CT CT ANGIO AOBIFEM WO/W CM
2 of 10 series · 11 of 46 positions shown, 15 images · IV contrast (APPLIED)
Comparison: [DATE]

CLINICAL DATA: Claudication

Loss of pulse in right leg
Recent femoral bypass
EXAM:
CT ANGIOGRAPHY OF ABDOMINAL AORTA WITH ILIOFEMORAL RUNOFF
TECHNIQUE: Multidetector CT imaging of the abdomen, pelvis and lower
extremities was performed using the standard protocol during bolus
administration of intravenous contrast. Multiplanar CT image
reconstructions and MIPs were obtained to evaluate the vascular
anatomy.
CONTRAST:  100mL OMNIPAQUE IOHEXOL 350 MG/ML SOLN

[Series 6: axial arterial lower · axial · arterial · 0.69mm/px · z∈[+132,+1092]mm · 10 of 374 slices shown, 13 images]
[im 36/374  soft-tissue]
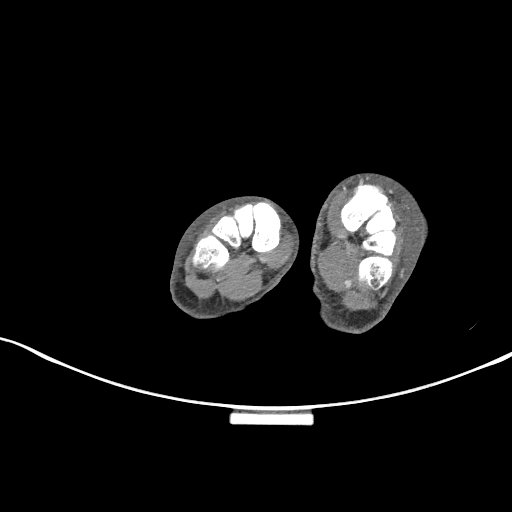
[im 36/374  bone]
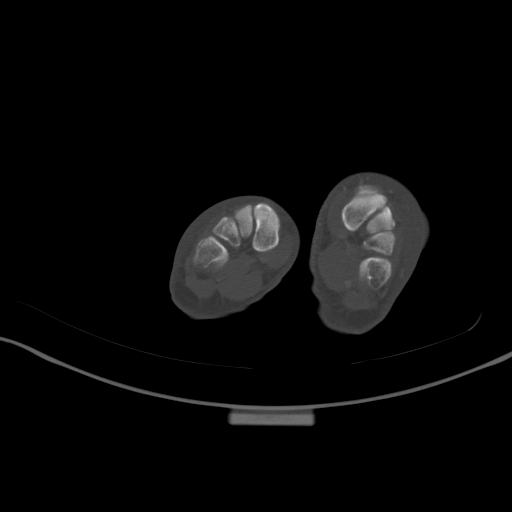
[im 72/374  soft-tissue]
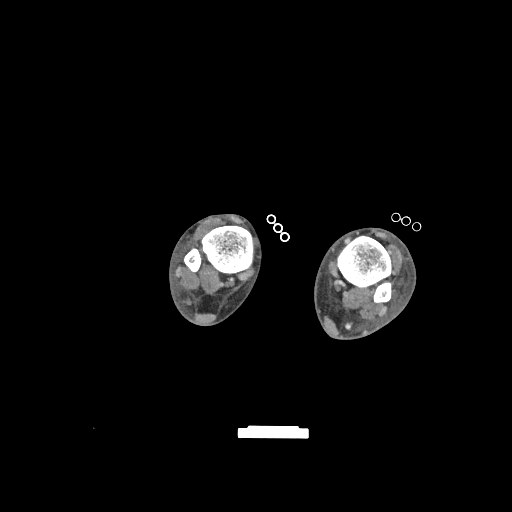
[im 125/374  soft-tissue]
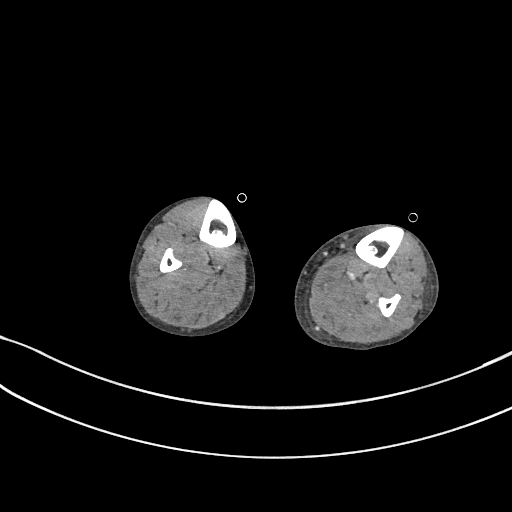
[im 160/374  soft-tissue]
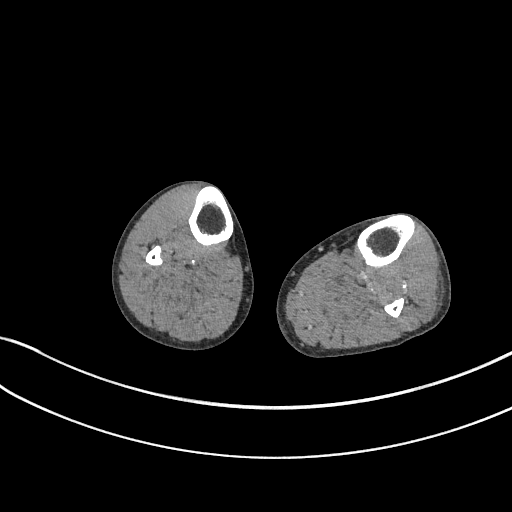
[im 214/374  soft-tissue]
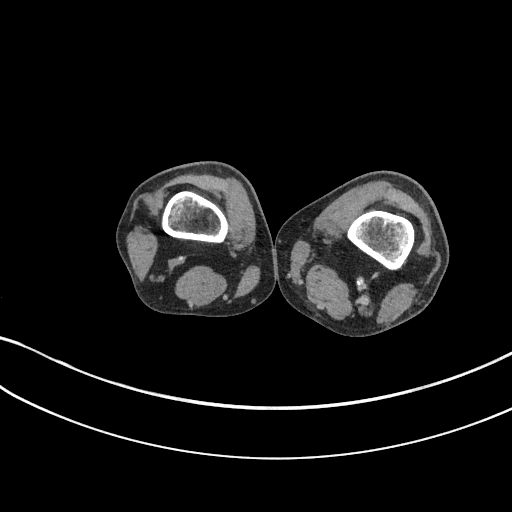
[im 249/374  soft-tissue]
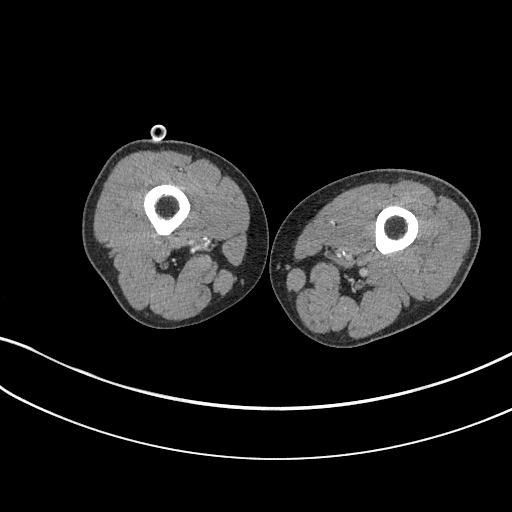
[im 302/374  soft-tissue]
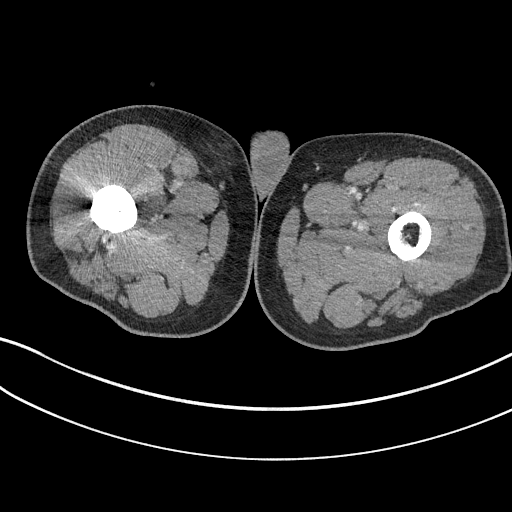
[im 302/374  lung]
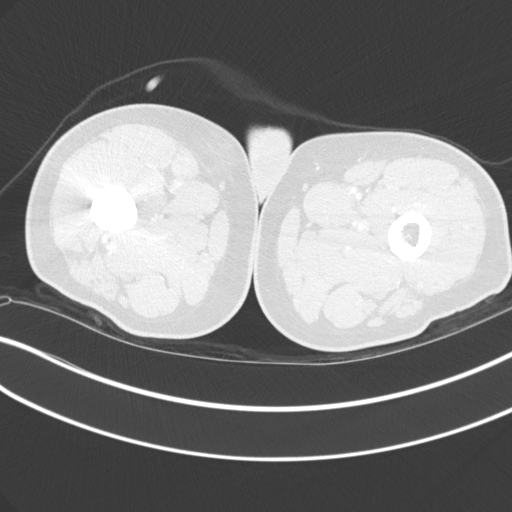
[im 320/374  lung]
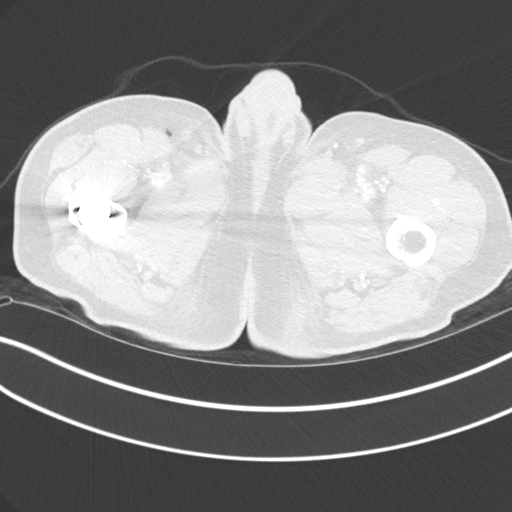
[im 338/374  soft-tissue]
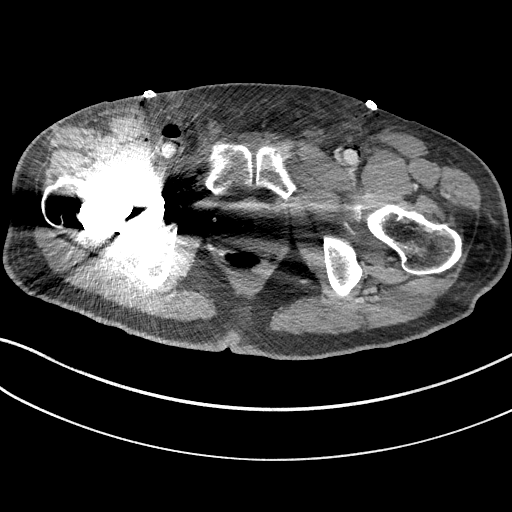
[im 338/374  lung]
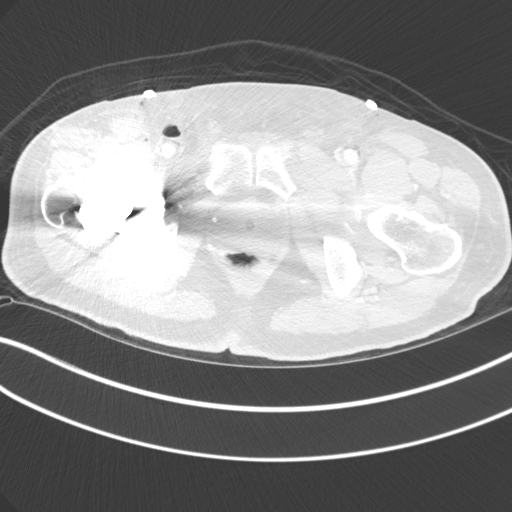
[im 356/374  lung]
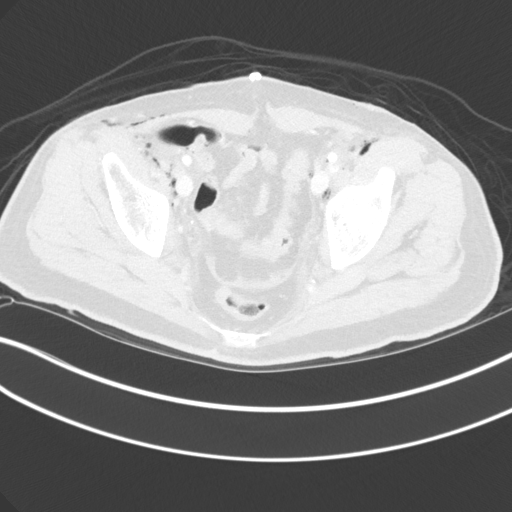

[Series 9: coronal upper · coronal · 0.77mm/px · 1 of 122 slices shown, 2 images]
[im 61/122  soft-tissue]
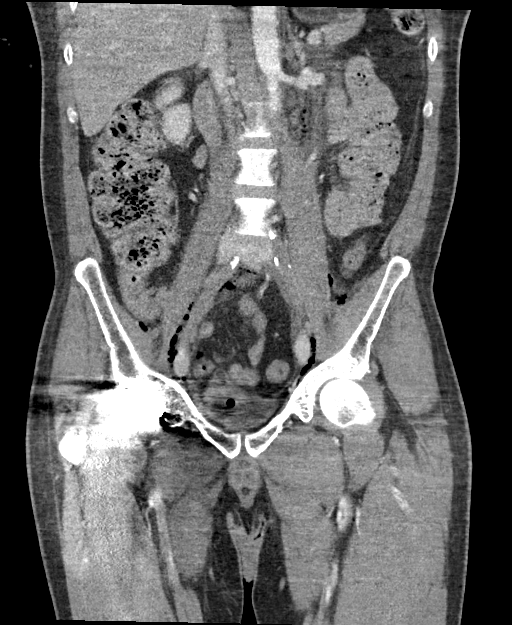
[im 61/122  bone]
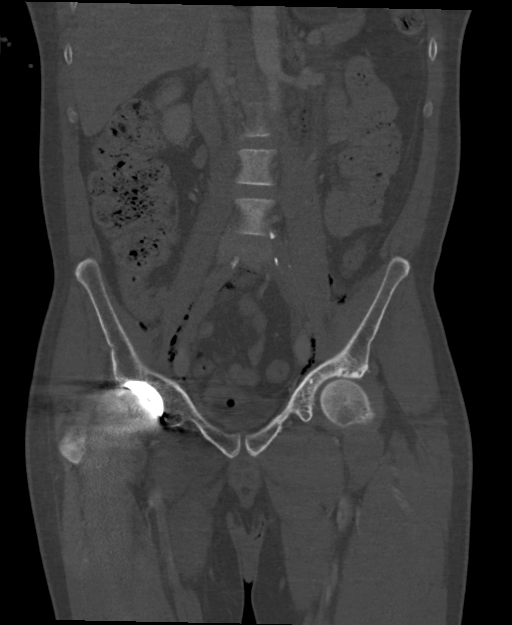

[11 of 46 positions shown; findings below may reference images not displayed]

FINDINGS: VASCULAR

Aorta: Occluded infrarenal abdominal aorta again seen. Interval
placement of aorto bi femoral bypass graft which is patent without
significant stenosis.

Celiac: Patent without evidence of aneurysm, dissection, vasculitis
or significant stenosis.

SMA: Patent without evidence of aneurysm, dissection, vasculitis or
significant stenosis.

Renals: Both renal arteries are patent without evidence of aneurysm,
dissection, vasculitis, fibromuscular dysplasia or significant
stenosis.

IMA: Occluded proximally. Flow within the distal branches consistent
with retrograde collateral flow.

RIGHT Lower Extremity

Inflow: Native right common iliac and external iliac arteries are
occluded. Proximal internal iliac artery is occluded. Retrograde
flow is present within distal branches.

Outflow: Flow reconstitutes within the right common femoral artery
through bypass. The Profunda femoris artery is patent. Complete to
near complete occlusion of the proximal SFA over course of
approximately 5 cm, best seen on the sagittal series. There is
reconstitution of flow within the right SFA beyond the occluded
segment. The distal right SFA is diminutive and diffusely diseased
with calcified on noncalcified plaque. Right popliteal artery is
diminutive but patent.

Runoff: Atherosclerotic calcifications seen in the tibioperoneal
trunk which remains patent. Peroneal and posterior tibial arteries
are patent to the ankle. No significant opacification of the
anterior tibial artery.

LEFT Lower Extremity

Inflow: Native left common and external iliac arteries are occluded.
Proximal internal iliac artery is occluded. Opacification of the
distal branches consistent with retrograde collateral flow.

Outflow: Flow reconstitutes within the common femoral artery through
bypass limb. Profunda femoris artery is patent. The superficial
femoral artery is the diffusely narrowed and diminutive but patent.
Popliteal artery is diminutive but patent.

Runoff: 2 peroneal trunk is patent. There is 2 vessel runoff to the
left ankle through the peroneal and posterior tibial arteries. No
significant opacification of the anterior tibial artery, similar to
prior study.

Veins: No obvious venous abnormality within the limitations of this
arterial phase study.

Review of the MIP images confirms the above findings.

NON-VASCULAR

Lower chest: Not included in the study.

Hepatobiliary: Visualized portions the liver are normal. No signal
abnormality of the gallbladder.

Pancreas: No significant abnormality.

Spleen: Visualized portions are unremarkable.

Adrenals/Urinary Tract: Adrenal glands are unremarkable. Bilateral
simple renal cysts. Kidneys and ureters otherwise unremarkable.
Evaluation of the bladder is limited due to streak artifact from
right total hip prosthesis. Air within the bladder lumen likely
iatrogenic given presence of Foley.

Stomach/Bowel: NG tube terminates within the gastric lumen.
Visualized portion of the stomach are otherwise unremarkable. No
dilated loops of bowel to indicate ileus or obstruction. Appendix is
normal.

Lymphatic: No enlarged abdominal or pelvic lymph nodes.

Reproductive: Evaluation limited due to streak artifact from right
total hip prosthesis. The prostate appears at least mildly enlarged.

Other: Anterior midline abdominal pelvic skin staples are noted.
Subcutaneous emphysema consistent with recent surgical intervention.
Mild subcutaneous edema of the ankles and feet. Ovoid cutaneous
lesion of the left dorsal midfoot measuring 3.8 x 1.4 cm. This is
similar in size to prior exam.

Musculoskeletal: Right total hip prosthesis is present. No acute
osseous abnormality.
IMPRESSION: VASCULAR

1. Newly placed aortobifemoral bypass graft is patent. Occluded
infrarenal abdominal aorta and bilateral common and external iliac
arteries again seen.
2. Interval complete to near complete occlusion of the proximal
right SFA involving the first 5 cm. There is reconstitution of flow
within the right SFA distal to this segment. The mid and distal
right SFA is diffusely narrowed similar to prior exam.

NON-VASCULAR

1. No acute nonvascular abnormality of the abdomen or pelvis.
[DATE] x 1.4 cm cutaneous lesion again seen in the dorsal midfoot.
This may be a blister. Please correlate with physical exam.

## 2021-06-02 IMAGING — DX DG ABDOMEN 1V
1 series · 1 of 1 positions shown · non-contrast
Comparison: None.

CLINICAL DATA: Orogastric tube placement.

EXAM:
ABDOMEN - 1 VIEW

[abdomen supine]
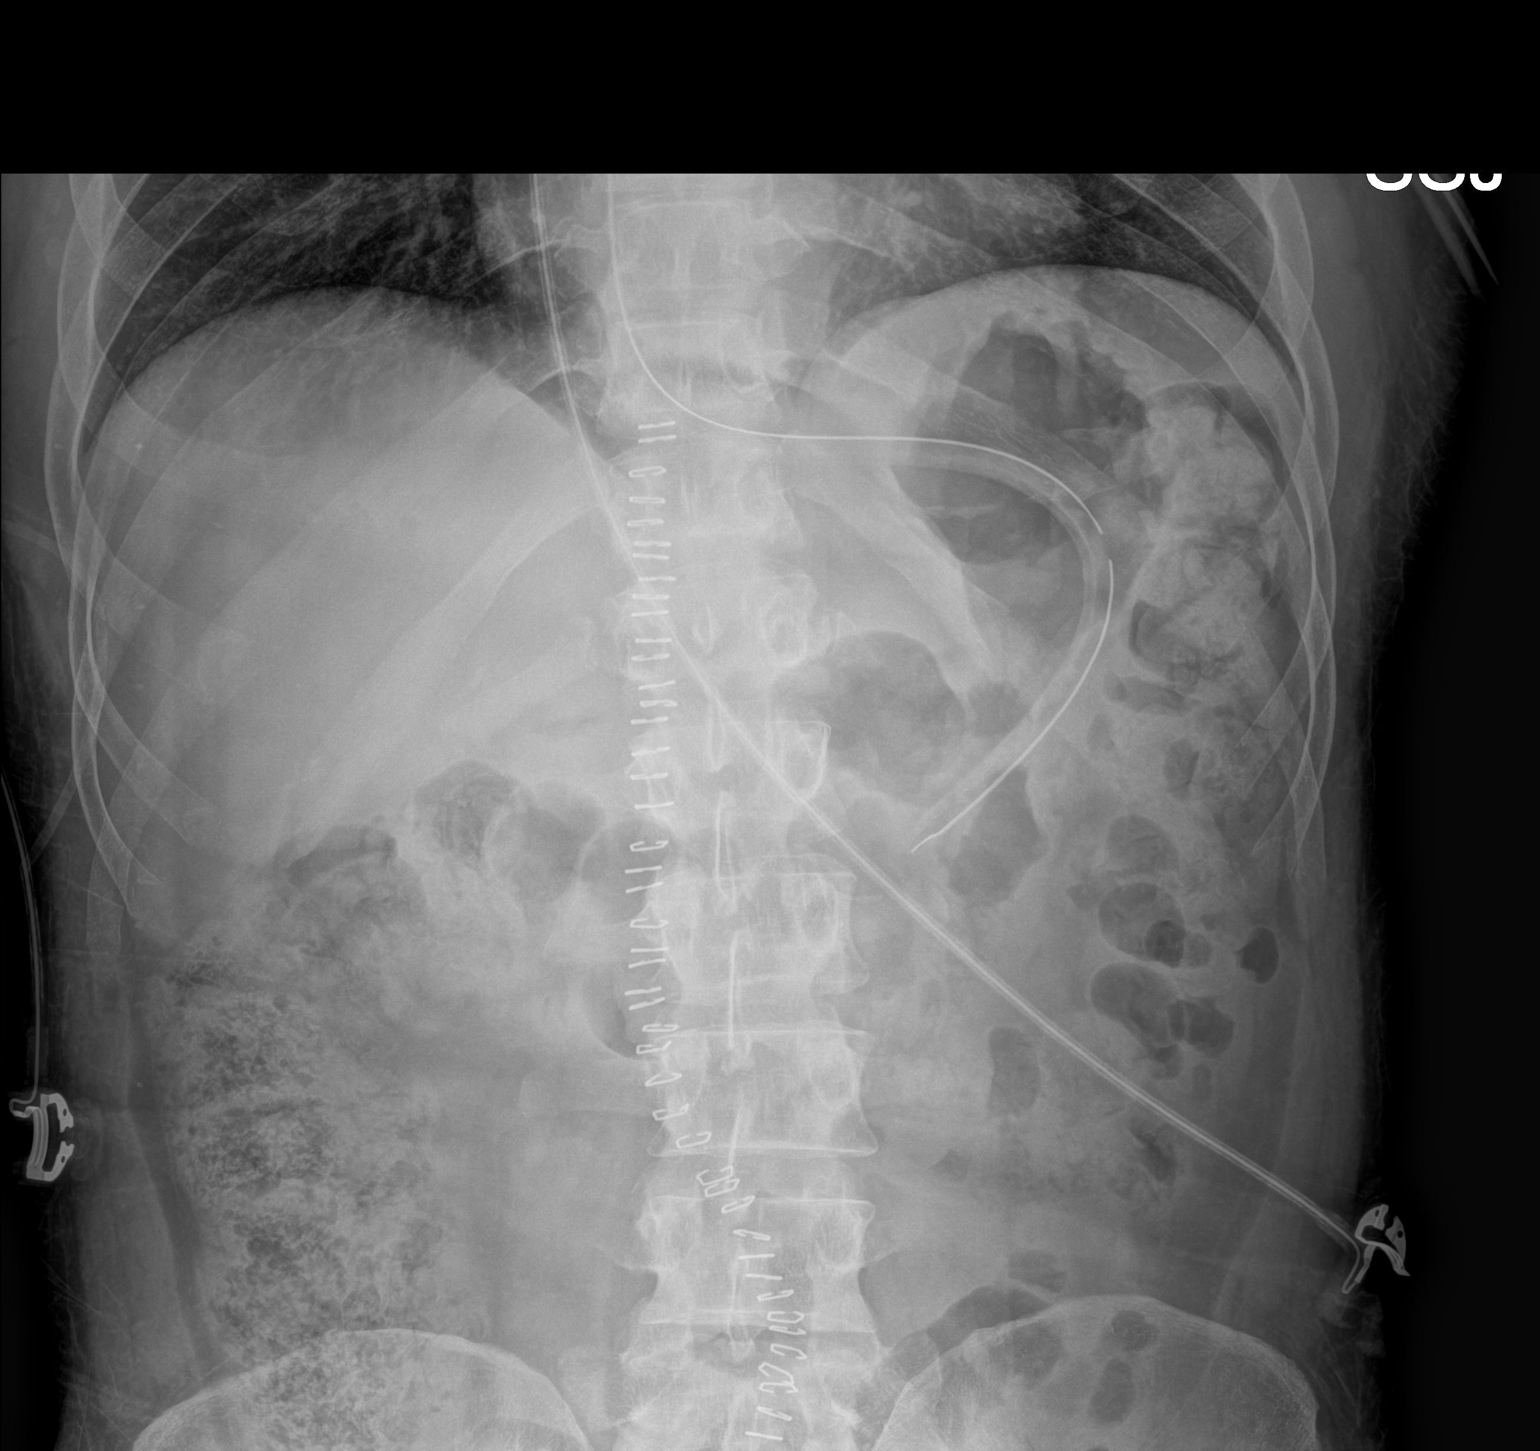

[1 of 1 positions shown; findings below may reference images not displayed]

FINDINGS: The bowel gas pattern is normal. Distal tip of nasogastric tube is
seen in the stomach. No radio-opaque calculi or other significant
radiographic abnormality are seen.
IMPRESSION: Distal tip of nasogastric tube seen in the stomach.

## 2021-06-02 SURGERY — ENDARTERECTOMY, POPLITEAL
Anesthesia: General | Site: Leg Upper | Laterality: Right

## 2021-06-02 MED ORDER — LIDOCAINE HCL (CARDIAC) PF 100 MG/5ML IV SOSY
PREFILLED_SYRINGE | INTRAVENOUS | Status: DC | PRN
  Administered 2021-06-02: 80 mg via INTRAVENOUS

## 2021-06-02 MED ORDER — SUGAMMADEX SODIUM 200 MG/2ML IV SOLN
INTRAVENOUS | Status: DC | PRN
  Administered 2021-06-02: 200 mg via INTRAVENOUS

## 2021-06-02 MED ORDER — DEXMEDETOMIDINE HCL IN NACL 200 MCG/50ML IV SOLN
INTRAVENOUS | Status: DC | PRN
  Administered 2021-06-02: 12 ug via INTRAVENOUS
  Administered 2021-06-02: 8 ug via INTRAVENOUS

## 2021-06-02 MED ORDER — LACTATED RINGERS IV SOLN
INTRAVENOUS | Status: DC | PRN
Start: 2021-06-02 — End: 2021-06-02

## 2021-06-02 MED ORDER — FENTANYL CITRATE (PF) 100 MCG/2ML IJ SOLN
INTRAMUSCULAR | Status: DC | PRN
  Administered 2021-06-02 (×7): 50 ug via INTRAVENOUS

## 2021-06-02 MED ORDER — SUCCINYLCHOLINE CHLORIDE 200 MG/10ML IV SOSY
PREFILLED_SYRINGE | INTRAVENOUS | Status: DC | PRN
  Administered 2021-06-02: 100 mg via INTRAVENOUS

## 2021-06-02 MED ORDER — SODIUM CHLORIDE 0.9 % IV SOLN
INTRAVENOUS | Status: DC | PRN
  Administered 2021-06-02: 300 mL

## 2021-06-02 MED ORDER — SUGAMMADEX SODIUM 500 MG/5ML IV SOLN
INTRAVENOUS | Status: AC
  Filled 2021-06-02: qty 5

## 2021-06-02 MED ORDER — MIDAZOLAM HCL 2 MG/2ML IJ SOLN
INTRAMUSCULAR | Status: AC
  Filled 2021-06-02: qty 2

## 2021-06-02 MED ORDER — PROTAMINE SULFATE 10 MG/ML IV SOLN
INTRAVENOUS | Status: DC | PRN
  Administered 2021-06-02: 45 mg via INTRAVENOUS
  Administered 2021-06-02: 5 mg via INTRAVENOUS

## 2021-06-02 MED ORDER — METOPROLOL TARTRATE 5 MG/5ML IV SOLN
INTRAVENOUS | Status: DC | PRN
  Administered 2021-06-02 (×2): 2.5 mg via INTRAVENOUS

## 2021-06-02 MED ORDER — CEFAZOLIN SODIUM-DEXTROSE 2-3 GM-%(50ML) IV SOLR
INTRAVENOUS | Status: DC | PRN
  Administered 2021-06-02: 2 g via INTRAVENOUS

## 2021-06-02 MED ORDER — HYDROMORPHONE HCL 1 MG/ML IJ SOLN
0.5000 mg | Freq: Once | INTRAMUSCULAR | Status: AC
  Administered 2021-06-02: 0.5 mg via INTRAVENOUS

## 2021-06-02 MED ORDER — ROCURONIUM BROMIDE 100 MG/10ML IV SOLN
INTRAVENOUS | Status: DC | PRN
  Administered 2021-06-02: 20 mg via INTRAVENOUS
  Administered 2021-06-02: 10 mg via INTRAVENOUS
  Administered 2021-06-02: 50 mg via INTRAVENOUS
  Administered 2021-06-02: 10 mg via INTRAVENOUS
  Administered 2021-06-02: 20 mg via INTRAVENOUS

## 2021-06-02 MED ORDER — HYDROMORPHONE HCL 1 MG/ML IJ SOLN
0.5000 mg | INTRAMUSCULAR | Status: DC | PRN
  Administered 2021-06-02 (×2): 0.5 mg via INTRAVENOUS
  Filled 2021-06-02 (×2): qty 1

## 2021-06-02 MED ORDER — ROCURONIUM BROMIDE 10 MG/ML (PF) SYRINGE
PREFILLED_SYRINGE | INTRAVENOUS | Status: AC
  Filled 2021-06-02: qty 10

## 2021-06-02 MED ORDER — SURGIFLO WITH THROMBIN (HEMOSTATIC MATRIX KIT) OPTIME
TOPICAL | Status: DC | PRN
  Administered 2021-06-02: 1 via TOPICAL

## 2021-06-02 MED ORDER — PROTAMINE SULFATE 10 MG/ML IV SOLN
INTRAVENOUS | Status: AC
  Filled 2021-06-02: qty 5

## 2021-06-02 MED ORDER — IOHEXOL 350 MG/ML SOLN
100.0000 mL | Freq: Once | INTRAVENOUS | Status: AC | PRN
  Administered 2021-06-02: 100 mL via INTRAVENOUS

## 2021-06-02 MED ORDER — ONDANSETRON HCL 4 MG/2ML IJ SOLN
INTRAMUSCULAR | Status: DC | PRN
  Administered 2021-06-02: 4 mg via INTRAVENOUS

## 2021-06-02 MED ORDER — HEPARIN SODIUM (PORCINE) 1000 UNIT/ML IJ SOLN
INTRAMUSCULAR | Status: DC | PRN
  Administered 2021-06-02: 7000 [IU] via INTRAVENOUS
  Administered 2021-06-02: 2000 [IU] via INTRAVENOUS

## 2021-06-02 MED ORDER — FENTANYL CITRATE (PF) 250 MCG/5ML IJ SOLN
INTRAMUSCULAR | Status: AC
  Filled 2021-06-02: qty 5

## 2021-06-02 MED ORDER — SUCCINYLCHOLINE CHLORIDE 200 MG/10ML IV SOSY
PREFILLED_SYRINGE | INTRAVENOUS | Status: AC
  Filled 2021-06-02: qty 10

## 2021-06-02 MED ORDER — ONDANSETRON HCL 4 MG/2ML IJ SOLN
INTRAMUSCULAR | Status: AC
  Filled 2021-06-02: qty 4

## 2021-06-02 MED ORDER — PHENYLEPHRINE HCL (PRESSORS) 10 MG/ML IV SOLN
INTRAVENOUS | Status: AC
  Filled 2021-06-02: qty 1

## 2021-06-02 MED ORDER — AMLODIPINE BESYLATE 10 MG PO TABS
10.0000 mg | ORAL_TABLET | Freq: Every day | ORAL | Status: DC
  Administered 2021-06-02 – 2021-06-05 (×4): 10 mg
  Filled 2021-06-02 (×4): qty 1

## 2021-06-02 MED ORDER — PROPOFOL 10 MG/ML IV BOLUS
INTRAVENOUS | Status: DC | PRN
  Administered 2021-06-02: 150 mg via INTRAVENOUS

## 2021-06-02 MED ORDER — HYDROMORPHONE HCL 1 MG/ML IJ SOLN
0.5000 mg | INTRAMUSCULAR | Status: DC | PRN
  Administered 2021-06-02 – 2021-06-03 (×3): 0.5 mg via INTRAVENOUS
  Filled 2021-06-02 (×3): qty 1

## 2021-06-02 MED ORDER — FENTANYL CITRATE (PF) 100 MCG/2ML IJ SOLN
INTRAMUSCULAR | Status: AC
  Filled 2021-06-02: qty 2

## 2021-06-02 MED ORDER — MIDAZOLAM HCL 2 MG/2ML IJ SOLN
INTRAMUSCULAR | Status: DC | PRN
  Administered 2021-06-02: 2 mg via INTRAVENOUS

## 2021-06-02 MED ORDER — HYDROMORPHONE HCL 1 MG/ML IJ SOLN
1.0000 mg | Freq: Once | INTRAMUSCULAR | Status: AC
  Administered 2021-06-02: 1 mg via INTRAVENOUS

## 2021-06-02 MED ORDER — OXYCODONE-ACETAMINOPHEN 5-325 MG PO TABS
1.0000 | ORAL_TABLET | ORAL | Status: DC | PRN
  Administered 2021-06-02 – 2021-06-04 (×7): 2 via ORAL
  Filled 2021-06-02 (×7): qty 2

## 2021-06-02 MED ORDER — DEXAMETHASONE SODIUM PHOSPHATE 10 MG/ML IJ SOLN
INTRAMUSCULAR | Status: AC
  Filled 2021-06-02: qty 1

## 2021-06-02 MED ORDER — DEXAMETHASONE SODIUM PHOSPHATE 10 MG/ML IJ SOLN
INTRAMUSCULAR | Status: DC | PRN
  Administered 2021-06-02: 10 mg via INTRAVENOUS

## 2021-06-02 MED ORDER — HYDROMORPHONE HCL 1 MG/ML IJ SOLN
INTRAMUSCULAR | Status: AC
  Administered 2021-06-02: 1 mg via INTRAVENOUS
  Filled 2021-06-02: qty 1

## 2021-06-02 MED ORDER — HEPARIN SODIUM (PORCINE) 5000 UNIT/ML IJ SOLN
INTRAMUSCULAR | Status: AC
  Filled 2021-06-02: qty 1

## 2021-06-02 SURGICAL SUPPLY — 46 items
AGENT HMST KT MTR STRL THRMB (HEMOSTASIS) ×1
BAG DECANTER FOR FLEXI CONT (MISCELLANEOUS) ×2 IMPLANT
BAG ISL LRG 20X20 DRWSTRG (DRAPES) ×1
BAG ISOLATATION DRAPE 20X20 ST (DRAPES) ×1 IMPLANT
BLADE SURG 15 STRL LF DISP TIS (BLADE) ×2 IMPLANT
BLADE SURG 15 STRL SS (BLADE) ×4
BLADE SURG SZ11 CARB STEEL (BLADE) ×2 IMPLANT
BOOT SUTURE AID YELLOW STND (SUTURE) ×2 IMPLANT
CATH EMBOLECTOMY 3X80 (CATHETERS) ×2 IMPLANT
DRAPE INCISE IOBAN 66X45 STRL (DRAPES) ×2 IMPLANT
DRAPE ISOLATE BAG 20X20 STRL (DRAPES) ×1
DRSG OPSITE POSTOP 4X12 (GAUZE/BANDAGES/DRESSINGS) ×2 IMPLANT
DRSG OPSITE POSTOP 4X14 (GAUZE/BANDAGES/DRESSINGS) ×2 IMPLANT
DURAPREP 26ML APPLICATOR (WOUND CARE) ×4 IMPLANT
ELECT CAUTERY BLADE 6.4 (BLADE) ×2 IMPLANT
GAUZE 4X4 16PLY ~~LOC~~+RFID DBL (SPONGE) ×2 IMPLANT
GOWN STRL REUS W/ TWL LRG LVL3 (GOWN DISPOSABLE) ×1 IMPLANT
GOWN STRL REUS W/ TWL XL LVL3 (GOWN DISPOSABLE) ×1 IMPLANT
GOWN STRL REUS W/TWL LRG LVL3 (GOWN DISPOSABLE) ×2
GOWN STRL REUS W/TWL XL LVL3 (GOWN DISPOSABLE) ×2
GRAFT PROPATEN W/RING 6X80X60 (Vascular Products) IMPLANT
IV NS 500ML (IV SOLUTION) ×2
IV NS 500ML BAXH (IV SOLUTION) ×1 IMPLANT
KIT TURNOVER KIT A (KITS) ×2 IMPLANT
LOOP VESSEL MINI 0.8X406 BLUE (MISCELLANEOUS) ×1 IMPLANT
LOOP VESSEL MINI RED (MISCELLANEOUS) ×1 IMPLANT
LOOPS BLUE MINI 0.8X406MM (MISCELLANEOUS) ×1
MANIFOLD NEPTUNE II (INSTRUMENTS) ×2 IMPLANT
PACK BASIN MAJOR ARMC (MISCELLANEOUS) ×2 IMPLANT
PATCH CAROTID ECM VASC 1X10 (Prosthesis & Implant Heart) ×2 IMPLANT
SPONGE T-LAP 18X18 ~~LOC~~+RFID (SPONGE) ×2 IMPLANT
STAPLER SKIN PROX 35W (STAPLE) ×2 IMPLANT
SURGIFLO W/THROMBIN 8M KIT (HEMOSTASIS) ×2 IMPLANT
SUT PROLENE 5 0 RB 1 DA (SUTURE) ×6 IMPLANT
SUT PROLENE 6 0 BV (SUTURE) ×16 IMPLANT
SUT SILK 2 0 (SUTURE) ×2
SUT SILK 2-0 18XBRD TIE 12 (SUTURE) ×1 IMPLANT
SUT SILK 3 0 (SUTURE) ×2
SUT SILK 3-0 18XBRD TIE 12 (SUTURE) ×1 IMPLANT
SUT SILK 4 0 (SUTURE) ×2
SUT SILK 4-0 18XBRD TIE 12 (SUTURE) ×1 IMPLANT
SUT VICRYL 2-0 SH 8X27 (SUTURE) ×2 IMPLANT
SUT VICRYL 3-0 (SUTURE) ×2 IMPLANT
SYR 3ML LL SCALE MARK (SYRINGE) ×2 IMPLANT
SYR BULB IRRIG 60ML STRL (SYRINGE) ×2 IMPLANT
VESSEL LOOPS MINI RED (MISCELLANEOUS) ×2

## 2021-06-02 NOTE — Op Note (Signed)
Patient name: Jeffrey Grant. MRN: [id number] DOB: 1981-01-27 Sex: male  06/02/2021 Pre-operative Diagnosis: Right foot ischemia/wrist pain Post-operative diagnosis:  Same Surgeon:  Durene Cal Procedure:   Right superficial femoral endarterectomy with CorMatrix patch angioplasty Anesthesia:  General Blood Loss:  50 cc Specimens:  none  Findings: Intimal flap at the distal anastomosis with dissection and acute thrombus.  Endarterectomy of the superficial femoral artery was performed.  I opened the superficial femoral artery for approximately 3 cm.  He had plaque that continued down the artery.  I transected the plaque at the end of the arteriotomy and tacked this down with 6-0 Prolene sutures.  A CorMatrix patch was placed on the superficial femoral artery extending up to the toe of the aortobifemoral graft.  He had a multiphasic posterior tibial Doppler signal after the procedure.  Indications: This is a 41 year old gentleman who is 1 day status post aortobifemoral bypass.  He has had minimal signals in his right foot and is complaining of rest pain.  I obtained a CT scan that showed a 5 cm segment occlusion of the proximal superficial femoral artery.  Because of the patient's symptoms we discussed going the operating room for endarterectomy versus femoral-popliteal bypass.  Consent was obtained from the patient and his family.  Procedure:  The patient was identified in the holding area and taken to [facility] OR ROOM 09  The patient was then placed supine on the table. general anesthesia was administered.  The patient was prepped and draped in the usual sterile fashion.  A time out was called and antibiotics were administered.  The patient's previous longitudinal right groin incision was opened by removing the staples and the sutures.  I exposed the right limb of the aortobifemoral graft and encircled this with a vessel loop.  I also dissected out the proximal common femoral artery and  profundofemoral artery.  I then identified the superficial femoral artery.  There was a pulse within the graft but not the proximal superficial femoral artery.  There was a calcified plaque just distal to the graft.  Interestingly, there was a Doppler signal within the artery at this level.  In order to get better exposure, I extended the longitudinal groin incision.  This enabled me to dissect out the more distal superficial femoral artery for an additional 5 cm.  Once I had adequate exposure, the patient was fully heparinized.  I opened the hood of the graft by making a longitudinal arteriotomy.  There was no backbleeding from the superficial femoral artery.  I then used Potts scissors to open the anterior surface of the superficial femoral artery for approximately 3 cm.  There was a intimal flap with acute thrombus at this level, where the plaque had previously been tacked down.  I removed the Prolene tacking sutures and proceeded to perform endarterectomy down the superficial femoral artery.  The plaque continued down the artery and so I transected this and tacked it down with multiple 6-0 Prolene sutures.  I then selected a CorMatrix patch and performed patch angioplasty of the superficial femoral artery and extended this up onto the toe of the dacryon graft.  Prior to completion the appropriate flushing maneuvers were performed, and the anastomosis was completed.  Blood flow was then reestablished to the leg.  There was a brisk Doppler signal in the superficial femoral artery as well as a palpable pulse.  He now has a brisk posterior tibial Doppler signal at the ankle.  The wound was then  irrigated and hemostasis was achieved.  I reversed the heparin with 50 mg of protamine.  Surgi-Flo was used to aid with hemostasis.  Once I was satisfied with this, the femoral sheath was reapproximated with 2-0 Vicryl.  The subcutaneous tissue was then closed with additional layers of 2-0 and 3-0 Vicryl followed by staple  closure of the skin.  Sterile dressings were applied.  The patient was successfully extubated taken recovery in stable condition.   Disposition: To PACU stable.   Juleen China, M.D., [facility] [place] Office: [phone] Pager:  [phone]

## 2021-06-02 NOTE — Progress Notes (Addendum)
OT Cancellation Note  Patient Details Name: Kharson Rasmusson. MRN: [id number] DOB: 1980/08/15   Cancelled Treatment:    Reason Eval/Treat Not Completed: Patient at procedure or test/ unavailable Pt being prepared for revascularization today. OT will require new or continue @ transfer orders to continue following after procedure should pt remain appropriate.   Rejeana Brock, MS, OTR/L ascom [phone] 06/02/21, 1:26 PM

## 2021-06-02 NOTE — Progress Notes (Signed)
OT Cancellation Note  Patient Details Name: Markez Dowland. MRN: [id number] DOB: 1981-01-09   Cancelled Treatment:    Reason Eval/Treat Not Completed: Other (comment) OT consult received and chart reviewed. Upon speaking to primary RN, she reports pt is about to received medication and then head to CT. Will f/u at later date/time as able/pt appropriate. Thank you.  Rejeana Brock, MS, OTR/L ascom [phone] 06/02/21, 9:25 AM

## 2021-06-02 NOTE — Transfer of Care (Signed)
Immediate Anesthesia Transfer of Care Note  Patient: Jeffrey Grant.  Procedure(s) Performed: POSSIBLE POPLITEAL BIPASS, POSSIBLE ENDARTERECTOMY POPLITEAL (Right: Leg Upper)  Patient Location: PACU  Anesthesia Type:General  Level of Consciousness: awake  Airway & Oxygen Therapy: Patient Spontanous Breathing  Post-op Assessment: Report given to RN  Post vital signs: stable  Last Vitals:  Vitals Value Taken Time  BP    Temp    Pulse 112 06/02/21 1729  Resp 16 06/02/21 1729  SpO2 100 % 06/02/21 1729  Vitals shown include unvalidated device data.  Last Pain:  Vitals:   06/02/21 1420  TempSrc:   PainSc: 6       Patients Stated Pain Goal: 2 (06/02/21 1420)  Complications: No notable events documented.

## 2021-06-02 NOTE — Progress Notes (Signed)
    Subjective  - POD #1, status post aorto by  Complaining of abdominal pain as well as pain on the bottom of his right foot which is constant.  His left leg feels much better.   Physical Exam:  Faint monophasic right posterior tibial Doppler signal.  Brisk left posterior tibial Doppler signal.  Diminished sensation on the right foot especially on the bottom.  He is able to move his toes on the right as well as left       Assessment/Plan:  POD #1  The patient continues to have pain in his right foot following surgery.  In addition, he has barely audible Doppler signals.  I ordered a CT angiogram.  This confirms that his aortobifemoral bypass graft is patent however there is a 5 cm occlusion of the proximal right superficial femoral artery, which is likely the explanation for his right foot pain.  I discussed with the patient and his family that we should go back to the operating room for further revascularization.  This will either the a right superficial femoral endarterectomy or possibly a right femoral-popliteal bypass graft.  Risks and benefits were discussed.  They all wish to proceed.  Wells Herb Beltre 06/02/2021 1:30 PM --  Vitals:   06/02/21 0855 06/02/21 1000  BP: (!) 163/67 (!) 163/67  Pulse:    Resp:    Temp:    SpO2:      Intake/Output Summary (Last 24 hours) at 06/02/2021 1330 Last data filed at 06/02/2021 0604 Gross per 24 hour  Intake 2034.68 ml  Output 2875 ml  Net -840.32 ml     Laboratory CBC    Component Value Date/Time   WBC 14.5 (H) 06/02/2021 0513   HGB 11.0 (L) 06/02/2021 0513   HGB 14.4 03/30/2018 1404   HCT 32.3 (L) 06/02/2021 0513   HCT 42.9 03/30/2018 1404   PLT 195 06/02/2021 0513   PLT 321 03/30/2018 1404    BMET    Component Value Date/Time   NA 134 (L) 06/02/2021 0513   NA 140 03/30/2018 1404   K 4.1 06/02/2021 0513   CL 101 06/02/2021 0513   CO2 26 06/02/2021 0513   GLUCOSE 148 (H) 06/02/2021 0513   BUN 15 06/02/2021 0513    BUN 9 03/30/2018 1404   CREATININE 0.75 06/02/2021 0513   CALCIUM 8.5 (L) 06/02/2021 0513   GFRNONAA >60 06/02/2021 0513   GFRAA 129 03/30/2018 1404    COAG Lab Results  Component Value Date   INR 0.9 06/01/2021   INR 1.0 05/29/2021   INR 0.91 10/09/2016   No results found for: PTT  Antibiotics Anti-infectives (From admission, onward)    Start     Dose/Rate Route Frequency Ordered Stop   06/01/21 2100  ceFAZolin (ANCEF) IVPB 2g/100 mL premix        2 g 200 mL/hr over 30 Minutes Intravenous Every 8 hours 06/01/21 1443 06/02/21 0659   06/01/21 0600  ceFAZolin (ANCEF) IVPB 2g/100 mL premix        2 g 200 mL/hr over 30 Minutes Intravenous On call to O.R. 05/31/21 2238 06/01/21 1245   05/29/21 0645  ceFAZolin (ANCEF) IVPB 2g/100 mL premix        2 g 200 mL/hr over 30 Minutes Intravenous  Once 05/29/21 0636 05/29/21 2109        V. Charlena Cross, M.D., [facility] [place] Office: [phone] Pager:  [phone]

## 2021-06-02 NOTE — Progress Notes (Signed)
PT Cancellation Note  Patient Details Name: Jeffrey Grant. MRN: [id number] DOB: November 30, 1980   Cancelled Treatment:    Reason Eval/Treat Not Completed: Patient at procedure or test/unavailable PT orders received, chart reviewed. In AM pt with elevated HR in 120s at rest, in PM pt going off floor to OR for revascularization. Will f/u as able.  Aleda Grana, PT, DPT 06/02/21, 1:21 PM    Sandi Mariscal 06/02/2021, 1:20 PM

## 2021-06-02 NOTE — Anesthesia Postprocedure Evaluation (Signed)
Anesthesia Post Note  Patient: Jeffrey Grant.  Procedure(s) Performed: Right Femoral Endarterectomy and Patching Endoplasty (Right: Leg Upper)  Patient location during evaluation: PACU Anesthesia Type: General Level of consciousness: combative Pain management: pain level controlled Vital Signs Assessment: post-procedure vital signs reviewed and stable Respiratory status: spontaneous breathing, nonlabored ventilation and respiratory function stable Cardiovascular status: blood pressure returned to baseline and stable (cleviprex gtt slowing being weaned down) Postop Assessment: no apparent nausea or vomiting Anesthetic complications: no   No notable events documented.   Last Vitals:  Vitals:   06/02/21 1745 06/02/21 1751  BP:  134/85  Pulse:    Resp: 15 16  Temp:    SpO2:  99%    Last Pain:  Vitals:   06/02/21 1743  TempSrc:   PainSc: 0-No pain                 Foye Deer

## 2021-06-02 NOTE — Anesthesia Procedure Notes (Signed)
Procedure Name: Intubation Date/Time: 06/02/2021 2:23 PM Performed by: Pearla Dubonnet, CRNA Pre-anesthesia Checklist: Patient identified, Emergency Drugs available, Suction available and Patient being monitored Patient Re-evaluated:Patient Re-evaluated prior to induction Oxygen Delivery Method: Circle system utilized Preoxygenation: Pre-oxygenation with 100% oxygen Induction Type: IV induction Ventilation: Mask ventilation without difficulty Laryngoscope Size: McGraph and 3 Grade View: Grade I Tube type: Oral Tube size: 7.5 mm Number of attempts: 1 Airway Equipment and Method: Stylet Placement Confirmation: ETT inserted through vocal cords under direct vision, positive ETCO2 and breath sounds checked- equal and bilateral Secured at: 23 cm Tube secured with: Tape Dental Injury: Teeth and Oropharynx as per pre-operative assessment

## 2021-06-02 NOTE — Progress Notes (Signed)
NAME:  Jeffrey Grant, Jeffrey Grant MRN:  [id number], DOB:  [birth year], LOS: 4 ADMISSION DATE:  05/29/2021, CONSULTATION DATE:  06/01/2021 REFERRING MD:  Dr. Gilda Crease, CHIEF COMPLAINT:  Bilateral LE rest pain   Brief Pt Description / Synopsis:  40 y.o Male admitted with Severe Bilateral Peripheral Artery Disease with rest pain and ulceration of the left foot, s/p Aortobifemoral bypass Graft, and Bilateral femoral artery endarterectomies on 06/01/21.  History of Present Illness:  Jeffrey Grant is a 40 year old male with a past medical history significant for hypertension, hyperlipidemia, severe peripheral vascular disease, back pain, and rheumatoid arthritis who presented to [facility] on 05/29/2021 for elective bilateral lower extremity angiogram with Vascular Surgery.  Patient follows with [place] [facility] outpatient, and was found to have bilateral extremity rest pain along with ulceration of the left foot.  Angiogram was unsuccessful, unable to revascularize due to the severity of disease.  Tentative plan was for aortobifemoral bypass on Friday.  Cardiology was consulted for cardiac clearance prior to surgery.  On 06/01/2021 he underwent the aortobifemoral bypass graft along with bilateral femoral endarterectomies.  He is transferred to ICU post procedure.  Noted to have significant pain and severe hypertension.  PCCM is consulted for medical management and pain management while in ICU.  Pertinent  Medical History  Hypertension Hyperlipidemia PVD Back pain Rheumatoid Arthritis  Micro Data:  05/29/2021: SARS-CoV-2 & Influenza PCR >> negative  Antimicrobials:  Cefazolin 12/9 (surgical prophylaxis, ordered for 2 doses)  Significant Hospital Events: Including procedures, antibiotic start and stop dates in addition to other pertinent events   12/6: Underwent elective Angiogram, unable to revascularize due to severity of disease.  Plan for Aortobifemoral bypass on Friday. 12/7: Cardiology  consulted for Cardiac clearance for procedure 12/9: Underwent aortobifemoral bypass graft, returns to ICU post procedure, PCCM consulted for medical and pain management while in ICU  Interim History / Subjective:  On clevidipine drip overnight. Received IV Dilaudid multiple times but no Percocet. Complains of abdominal pain this AM. Left foot is warm and well perfused, however, right foot remains somewhat cool with weak Doppler pulses.   Objective   Blood pressure (!) 149/80, pulse (!) 120, temperature 98.8 F (37.1 C), temperature source Oral, resp. rate 17, height 5\' 8"  (1.727 m), weight 73 kg, SpO2 100 %.        Intake/Output Summary (Last 24 hours) at 06/02/2021 0810 Last data filed at 06/02/2021 0604 Gross per 24 hour  Intake 5234.68 ml  Output 3475 ml  Net 1759.68 ml   Filed Weights   05/29/21 0728 06/01/21 0656  Weight: 72.6 kg 73 kg    Examination: General: African American male in NAD Lungs: Clear breath sounds bilaterally, no wheezing or rales noted, even, nonlabored Cardiovascular: Regular rate and rhythm, S1-S2, no murmurs, rubs, gallops Abdomen: Tender, soft, nondistended, hypoactive bowel sounds Extremities: Normal bulk and tone, no edema Neuro: Awake and alert GU: Foley catheter in place draining yellow urine  Resolved Hospital Problem list     Assessment & Plan:   Severe PAD of bilateral lower extremities s/p aortofemoral bypass -Vascular Surgery following, appreciate input -Post-op management/assessment as per Vascular -Pain control with PRN Dilaudid and Percocet  Hypertension -Continuous cardiac monitoring -Maintain SBP <160 -Wean clevidipine gtt as able -Resume home Norvasc and Lisinopril today  Acute Blood Loss Anemia (EBL 250 cc intra-op) -Monitor for S/Sx of bleeding -Trend CBC -SCD's for VTE Prophylaxis (Lovenox to begin 12/10)  -Transfuse for Hgb <8  Mild Leukocytosis, suspect reactive -Monitor  fever curve -Trend WBC's   Best Practice  (right click and "Reselect all SmartList Selections" daily)   Diet/type: NPO DVT prophylaxis: LMWH GI prophylaxis: H2B Lines: Central line and yes and it is still needed Foley:  Yes, and it is still needed Code Status:  full code Last date of multidisciplinary goals of care discussion [N/A]  Labs   CBC: Recent Labs  Lab 05/30/21 0723 05/31/21 0552 06/01/21 0204 06/01/21 1517 06/02/21 0513  WBC 8.0 8.0 8.5 12.1* 14.5*  HGB 13.0 13.8 13.6 11.0* 11.0*  HCT 38.7* 41.1 41.0 32.7* 32.3*  MCV 88.4 87.3 87.2 87.7 85.4  PLT 258 259 274 194 195    Basic Metabolic Panel: Recent Labs  Lab 05/29/21 0714 05/30/21 0723 06/01/21 0204 06/01/21 1517 06/01/21 1702 06/02/21 0513  NA  --  135 137  --  132* 134*  K  --  4.7 4.5  --  4.7 4.1  CL  --  100 100  --  100 101  CO2  --  27 28  --  24 26  GLUCOSE  --  116* 134*  --  162* 148*  BUN 27* 17 20  --  16 15  CREATININE 0.83 0.61 0.76 0.75 0.71 0.75  CALCIUM  --  9.5 9.6  --  8.6* 8.5*  MG  --   --  2.5*  --   --  2.0  PHOS  --   --   --   --   --  3.8   GFR: Estimated Creatinine Clearance: 117.6 mL/min (by C-G formula based on SCr of 0.75 mg/dL). Recent Labs  Lab 05/31/21 0552 06/01/21 0204 06/01/21 1517 06/02/21 0513  WBC 8.0 8.5 12.1* 14.5*    Liver Function Tests: No results for input(s): AST, ALT, ALKPHOS, BILITOT, PROT, ALBUMIN in the last 168 hours. No results for input(s): LIPASE, AMYLASE in the last 168 hours. No results for input(s): AMMONIA in the last 168 hours.  ABG    Component Value Date/Time   PHART 7.33 (L) 06/01/2021 1059   PCO2ART 42 06/01/2021 1059   PO2ART 169 (H) 06/01/2021 1059   HCO3 22.2 06/01/2021 1059   ACIDBASEDEF 3.9 (H) 06/01/2021 1059   O2SAT 99.5 06/01/2021 1059     Coagulation Profile: Recent Labs  Lab 05/29/21 1607 06/01/21 0204  INR 1.0 0.9    Cardiac Enzymes: No results for input(s): CKTOTAL, CKMB, CKMBINDEX, TROPONINI in the last 168 hours.  HbA1C: No results found  for: HGBA1C  CBG: Recent Labs  Lab 06/01/21 1313 06/01/21 1433  GLUCAP 171* 171*    Review of Systems:   Pertinent findings noted in HPI and Interval History. All other systems reviewed and negative unless otherwise documented.  Past Medical History:  He,  has a past medical history of Anxiety, Atherosclerotic PVD with intermittent claudication (HCC), Back pain, Collagen vascular disease (HCC), Dyspnea, Dysrhythmia, Elevated lipids, Hypertension, Nausea, and Seropositive rheumatoid arthritis (HCC).   Surgical History:   Past Surgical History:  Procedure Laterality Date   COLONOSCOPY WITH PROPOFOL N/A 04/21/2018   Procedure: COLONOSCOPY WITH PROPOFOL;  Surgeon: [name], MD;  Location: [facility] ENDOSCOPY;  Service: Gastroenterology;  Laterality: N/A;   ESOPHAGOGASTRODUODENOSCOPY (EGD) WITH PROPOFOL N/A 04/21/2018   Procedure: ESOPHAGOGASTRODUODENOSCOPY (EGD) WITH PROPOFOL;  Surgeon: [name], MD;  Location: [facility] ENDOSCOPY;  Service: Gastroenterology;  Laterality: N/A;   HIP PINNING Right    LOWER EXTREMITY ANGIOGRAPHY N/A 05/29/2021   Procedure: LOWER EXTREMITY ANGIOGRAPHY;  Surgeon: Renford Dills, MD;  Location: [facility] INVASIVE CV LAB;  Service: Cardiovascular;  Laterality: N/A;   TOTAL HIP ARTHROPLASTY Right 10/22/2016   Procedure: TOTAL HIP ARTHROPLASTY ANTERIOR APPROACH;  Surgeon: Kennedy Bucker, MD;  Location: [facility] ORS;  Service: Orthopedics;  Laterality: Right;     Social History:   reports that he has been smoking cigarettes. He has a 4.00 pack-year smoking history. He has never used smokeless tobacco. He reports that he does not currently use drugs. He reports that he does not drink alcohol.   Family History:  His family history is not on file.   Allergies No Known Allergies   Home Medications  Prior to Admission medications   Medication Sig Start Date End Date Taking? Authorizing Provider  acetaminophen (TYLENOL) 500 MG tablet Take 1,000 mg by mouth every 6 (six)  hours as needed (pain.).   Yes [provider]  amLODipine (NORVASC) 5 MG tablet Take 5 mg by mouth in the morning.   Yes [provider]  cyclobenzaprine (FLEXERIL) 10 MG tablet Take 10 mg by mouth every 8 (eight) hours as needed for muscle spasms. 05/24/21  Yes [provider]  HYDROcodone-acetaminophen (NORCO/VICODIN) 5-325 MG tablet Take 1 tablet by mouth every 6 (six) hours as needed (pain.). 05/21/21  Yes [provider]  lisinopril (ZESTRIL) 20 MG tablet Take 20 mg by mouth daily.   Yes [provider]  LYRICA 100 MG capsule Take 100 mg by mouth 3 (three) times daily. 05/21/21  Yes [provider]  meloxicam (MOBIC) 15 MG tablet Take 15 mg by mouth in the morning.   Yes [provider]  NEURONTIN 300 MG capsule Take 600 mg by mouth 2 (two) times daily. 04/05/21  Yes [provider]  nicotine (NICODERM CQ - DOSED IN MG/24 HOURS) 21 mg/24hr patch Place 21 mg onto the skin daily.   Yes [provider]  nicotine polacrilex (NICORETTE) 4 MG gum Take 4 mg by mouth as needed for smoking cessation.   Yes [provider]  Vitamin D, Ergocalciferol, (DRISDOL) 50000 units CAPS capsule Take 50,000 Units by mouth every Thursday.   Yes [provider]     Marcelo Baldy, MD 06/02/21 8:11 AM   [facility] If after hours, please call E-link

## 2021-06-02 NOTE — Anesthesia Preprocedure Evaluation (Addendum)
Anesthesia Evaluation  Patient identified by MRN, date of birth, ID band Patient awake    Reviewed: Allergy & Precautions, H&P , NPO status , Patient's Chart, lab work & pertinent test results  History of Anesthesia Complications Negative for: history of anesthetic complications  Airway Mallampati: III  TM Distance: >3 FB Neck ROM: limited    Dental no notable dental hx.    Pulmonary shortness of breath, sleep apnea , neg COPD, Current Smoker and Patient abstained from smoking.,    breath sounds clear to auscultation       Cardiovascular hypertension, (-) angina+ Peripheral Vascular Disease  (-) Past MI and (-) Cardiac Stents + dysrhythmias (sinus tachycardia)  Rhythm:regular Rate:Normal  Bilateral lower extremity rest pain and ulceration to the left foot.  Unable to revascularize the bilateral lower extremity via angiogram due to the severity of his disease  S/p aortobifem 12/9 now with cold Right leg  Cardiology note:  "Lexiscan infusion Myoview and echocardiogram for risk stratification revealed no structural, functional, valvular abnormalities. Patient is considered medically optimized from a cardiac standpoint for surgery"   Neuro/Psych PSYCHIATRIC DISORDERS Anxiety Depression Schizophrenia Cervical radiculopathy    GI/Hepatic negative GI ROS, (+)     substance abuse  marijuana use,   Endo/Other  negative endocrine ROS  Renal/GU      Musculoskeletal  (+) Arthritis ,   Abdominal   Peds  Hematology  (+) anemia ,   Anesthesia Other Findings Cold R leg with numbness to knee s/p aortobifem bybass  Aline and NGtube in place. Pt with abdominal pain. Denies nausea/vomiting. Alert and Oriented x 4.   Past Medical History: No date: Anxiety No date: Atherosclerotic PVD with intermittent claudication (HCC) No date: Back pain No date: Collagen vascular disease (HCC) No date: Dyspnea No date: Dysrhythmia No date:  Elevated lipids No date: Hypertension No date: Nausea No date: Seropositive rheumatoid arthritis (HCC)  Past Surgical History: 04/21/2018: COLONOSCOPY WITH PROPOFOL; N/A     Comment:  Procedure: COLONOSCOPY WITH PROPOFOL;  Surgeon: [name], MD;  Location: [facility] ENDOSCOPY;  Service:               Gastroenterology;  Laterality: N/A; 04/21/2018: ESOPHAGOGASTRODUODENOSCOPY (EGD) WITH PROPOFOL; N/A     Comment:  Procedure: ESOPHAGOGASTRODUODENOSCOPY (EGD) WITH               PROPOFOL;  Surgeon: [name], MD;  Location: [facility]               ENDOSCOPY;  Service: Gastroenterology;  Laterality: N/A; No date: HIP PINNING; Right 05/29/2021: LOWER EXTREMITY ANGIOGRAPHY; N/A     Comment:  Procedure: LOWER EXTREMITY ANGIOGRAPHY;  Surgeon:               Renford Dills, MD;  Location: [facility] INVASIVE CV LAB;               Service: Cardiovascular;  Laterality: N/A; 10/22/2016: TOTAL HIP ARTHROPLASTY; Right     Comment:  Procedure: TOTAL HIP ARTHROPLASTY ANTERIOR APPROACH;                Surgeon: Kennedy Bucker, MD;  Location: [facility] ORS;  Service:              Orthopedics;  Laterality: Right;  BMI    Body Mass Index: 24.48 kg/m      Reproductive/Obstetrics negative OB ROS  Anesthesia Physical  Anesthesia Plan  ASA: 3  Anesthesia Plan: General ETT   Post-op Pain Management:    Induction: Intravenous  PONV Risk Score and Plan: Ondansetron, Dexamethasone, Midazolam and Treatment may vary due to age or medical condition  Airway Management Planned: Oral ETT  Additional Equipment: Arterial line  Intra-op Plan:   Post-operative Plan:   Informed Consent: I have reviewed the patients History and Physical, chart, labs and discussed the procedure including the risks, benefits and alternatives for the proposed anesthesia with the patient or authorized representative who has indicated his/her understanding and acceptance.      Dental Advisory Given  Plan Discussed with: Anesthesiologist, CRNA and Surgeon  Anesthesia Plan Comments: (Discussed risks and benefits of anesthesia.  All questions answered and patient provides consent to proceed.)        Anesthesia Quick Evaluation

## 2021-06-03 DIAGNOSIS — L97909 Non-pressure chronic ulcer of unspecified part of unspecified lower leg with unspecified severity: Secondary | ICD-10-CM | POA: Diagnosis not present

## 2021-06-03 DIAGNOSIS — I70299 Other atherosclerosis of native arteries of extremities, unspecified extremity: Secondary | ICD-10-CM | POA: Diagnosis not present

## 2021-06-03 LAB — MAGNESIUM: Magnesium: 2.1 mg/dL (ref 1.7–2.4)

## 2021-06-03 LAB — BASIC METABOLIC PANEL
Anion gap: 7 (ref 5–15)
BUN: 15 mg/dL (ref 6–20)
CO2: 27 mmol/L (ref 22–32)
Calcium: 8.4 mg/dL — ABNORMAL LOW (ref 8.9–10.3)
Chloride: 101 mmol/L (ref 98–111)
Creatinine, Ser: 0.76 mg/dL (ref 0.61–1.24)
GFR, Estimated: >60 mL/min (ref >60)
Glucose, Bld: 122 mg/dL — ABNORMAL HIGH (ref 70–99)
Potassium: 4 mmol/L (ref 3.5–5.1)
Sodium: 135 mmol/L (ref 135–145)

## 2021-06-03 LAB — CBC
HCT: 26.2 % — ABNORMAL LOW (ref 39.0–52.0)
Hemoglobin: 9 g/dL — ABNORMAL LOW (ref 13.0–17.0)
MCH: 29.9 pg (ref 26.0–34.0)
MCHC: 34.4 g/dL (ref 30.0–36.0)
MCV: 87 fL (ref 80.0–100.0)
Platelets: 141 10*3/uL — ABNORMAL LOW (ref 150–400)
RBC: 3.01 MIL/uL — ABNORMAL LOW (ref 4.22–5.81)
RDW: 15.1 % (ref 11.5–15.5)
WBC: 14.2 10*3/uL — ABNORMAL HIGH (ref 4.0–10.5)
nRBC: 0 % (ref 0.0–0.2)

## 2021-06-03 LAB — PHOSPHORUS: Phosphorus: 2.9 mg/dL (ref 2.5–4.6)

## 2021-06-03 MED ORDER — GABAPENTIN 300 MG PO CAPS
600.0000 mg | ORAL_CAPSULE | Freq: Two times a day (BID) | ORAL | Status: DC
  Administered 2021-06-03 – 2021-06-04 (×3): 600 mg via ORAL
  Filled 2021-06-03 (×3): qty 2

## 2021-06-03 MED ORDER — HYDROMORPHONE HCL 1 MG/ML IJ SOLN
0.5000 mg | INTRAMUSCULAR | Status: DC | PRN
  Administered 2021-06-03 (×2): 0.5 mg via INTRAVENOUS
  Filled 2021-06-03: qty 0.5
  Filled 2021-06-03: qty 1

## 2021-06-03 MED ORDER — LISINOPRIL 20 MG PO TABS
20.0000 mg | ORAL_TABLET | Freq: Every day | ORAL | Status: DC
  Administered 2021-06-03 – 2021-06-05 (×3): 20 mg via ORAL
  Filled 2021-06-03 (×3): qty 1

## 2021-06-03 NOTE — Progress Notes (Signed)
NAME:  Jeffrey Grant, Jeffrey Grant MRN:  [id number], DOB:  11-19-1980, LOS: 5 ADMISSION DATE:  05/29/2021, CONSULTATION DATE:  06/01/2021 REFERRING MD:  Dr. Gilda Crease, CHIEF COMPLAINT:  Bilateral LE rest pain   Brief Pt Description / Synopsis:  40 y.o. male admitted with Severe Bilateral Peripheral Artery Disease with rest pain and ulceration of the left foot, s/p Aortobifemoral bypass Graft, and Bilateral femoral artery endarterectomies on 06/01/21.  History of Present Illness:  Jeffrey Grant is a 40 year old male with a past medical history significant for hypertension, hyperlipidemia, severe peripheral vascular disease, back pain, and rheumatoid arthritis who presented to [facility] on 05/29/2021 for elective bilateral lower extremity angiogram with Vascular Surgery.  Patient follows with [place] [facility], and was found to have bilateral extremity rest pain along with ulceration of the left foot.  Angiogram was unsuccessful, unable to revascularize due to the severity of disease.  Tentative plan was for aortobifemoral bypass on Friday.  Cardiology was consulted for cardiac clearance prior to surgery.  On 06/01/2021 he underwent the aortobifemoral bypass graft along with bilateral femoral endarterectomies.  He is transferred to ICU post procedure.  Noted to have significant pain and severe hypertension.  PCCM is consulted for medical management and pain management while in ICU.  Pertinent  Medical History  Hypertension Hyperlipidemia PVD Back pain Rheumatoid Arthritis  Micro Data:  05/29/2021: SARS-CoV-2 & Influenza PCR >> negative  Antimicrobials:  Cefazolin 12/9 (surgical prophylaxis, ordered for 2 doses)  Significant Hospital Events: Including procedures, antibiotic start and stop dates in addition to other pertinent events   12/6: Underwent elective Angiogram, unable to revascularize due to severity of disease.  Plan for Aortobifemoral bypass on Friday. 12/7: Cardiology  consulted for Cardiac clearance for procedure 12/9: Underwent aortobifemoral bypass graft, returns to ICU post procedure, PCCM consulted for medical and pain management while in ICU 12/10: returned to OR for Right superficial femoral endarterectomy  Interim History / Subjective:  Returned to OR yesterday for right superficial femoral endarterectomy given occlusion noted on CTA yesterday and cool right foot with weak pulses. Pain control is adequate this AM. He remains on clevidipine drip this AM which is nearly completely weaned.  Objective   Blood pressure 118/80, pulse (!) 107, temperature 98 F (36.7 C), temperature source Oral, resp. rate 16, height 5\' 8"  (1.727 m), weight 73 kg, SpO2 100 %.        Intake/Output Summary (Last 24 hours) at 06/03/2021 0732 Last data filed at 06/03/2021 0630 Gross per 24 hour  Intake 3627.22 ml  Output 3050 ml  Net 577.22 ml   Filed Weights   05/29/21 0728 06/01/21 0656  Weight: 72.6 kg 73 kg    Examination: General: African American male in NAD Lungs: Clear breath sounds bilaterally, no wheezing or rales noted, even, nonlabored Cardiovascular: Regular rate and rhythm, S1-S2, no murmurs, rubs, gallops Abdomen: Tender, soft, nondistended, hypoactive bowel sounds Neuro: Awake and alert GU: Foley catheter in place draining yellow urine  Resolved Hospital Problem list     Assessment & Plan:   Severe PAD of bilateral lower extremities s/p aortofemoral bypass and right superficial femoral endarterectomy -Vascular Surgery following, appreciate input -Post-op management/assessment as per Vascular -Pain control with PRN Dilaudid and Percocet  Hypertension -Continuous cardiac monitoring -Maintain SBP <160 -Wean clevidipine gtt off this morning -Resume home Norvasc and Lisinopril today  Acute Blood Loss Anemia (EBL 250 cc intra-op) -Monitor for S/Sx of bleeding -Trend CBC -SCD's for VTE Prophylaxis (Lovenox to begin 12/10)  -  Transfuse for  Hgb <8  Mild Leukocytosis, suspect reactive post-op -Monitor fever curve -Trend WBC's   Best Practice (right click and "Reselect all SmartList Selections" daily)   Diet/type: NPO, advancement per Surgery DVT prophylaxis: LMWH GI prophylaxis: H2B Lines: Central line and No longer needed.  Order written to d/c  Foley:  Yes, and it is still needed Code Status:  full code Last date of multidisciplinary goals of care discussion [N/A]  Labs   CBC: Recent Labs  Lab 05/31/21 0552 06/01/21 0204 06/01/21 1517 06/02/21 0513 06/03/21 0338  WBC 8.0 8.5 12.1* 14.5* 14.2*  HGB 13.8 13.6 11.0* 11.0* 9.0*  HCT 41.1 41.0 32.7* 32.3* 26.2*  MCV 87.3 87.2 87.7 85.4 87.0  PLT 259 274 194 195 141*    Basic Metabolic Panel: Recent Labs  Lab 05/30/21 0723 06/01/21 0204 06/01/21 1517 06/01/21 1702 06/02/21 0513 06/03/21 0338  NA 135 137  --  132* 134* 135  K 4.7 4.5  --  4.7 4.1 4.0  CL 100 100  --  100 101 101  CO2 27 28  --  24 26 27   GLUCOSE 116* 134*  --  162* 148* 122*  BUN 17 20  --  16 15 15   CREATININE 0.61 0.76 0.75 0.71 0.75 0.76  CALCIUM 9.5 9.6  --  8.6* 8.5* 8.4*  MG  --  2.5*  --   --  2.0 2.1  PHOS  --   --   --   --  3.8 2.9   GFR: Estimated Creatinine Clearance: 117.6 mL/min (by C-G formula based on SCr of 0.76 mg/dL). Recent Labs  Lab 06/01/21 0204 06/01/21 1517 06/02/21 0513 06/03/21 0338  WBC 8.5 12.1* 14.5* 14.2*    Liver Function Tests: No results for input(s): AST, ALT, ALKPHOS, BILITOT, PROT, ALBUMIN in the last 168 hours. No results for input(s): LIPASE, AMYLASE in the last 168 hours. No results for input(s): AMMONIA in the last 168 hours.  ABG    Component Value Date/Time   PHART 7.33 (L) 06/01/2021 1059   PCO2ART 42 06/01/2021 1059   PO2ART 169 (H) 06/01/2021 1059   HCO3 22.2 06/01/2021 1059   ACIDBASEDEF 3.9 (H) 06/01/2021 1059   O2SAT 99.5 06/01/2021 1059     Coagulation Profile: Recent Labs  Lab 05/29/21 1607 06/01/21 0204  INR  1.0 0.9    Cardiac Enzymes: No results for input(s): CKTOTAL, CKMB, CKMBINDEX, TROPONINI in the last 168 hours.  HbA1C: No results found for: HGBA1C  CBG: Recent Labs  Lab 06/01/21 1313 06/01/21 1433  GLUCAP 171* 171*    Review of Systems:   Pertinent findings noted in HPI and Interval History. All other systems reviewed and negative unless otherwise documented.  Past Medical History:  He,  has a past medical history of Anxiety, Atherosclerotic PVD with intermittent claudication (HCC), Back pain, Collagen vascular disease (HCC), Dyspnea, Dysrhythmia, Elevated lipids, Hypertension, Nausea, and Seropositive rheumatoid arthritis (HCC).   Surgical History:   Past Surgical History:  Procedure Laterality Date   COLONOSCOPY WITH PROPOFOL N/A 04/21/2018   Procedure: COLONOSCOPY WITH PROPOFOL;  Surgeon: 14/09/22, MD;  Location: [facility] ENDOSCOPY;  Service: Gastroenterology;  Laterality: N/A;   ESOPHAGOGASTRODUODENOSCOPY (EGD) WITH PROPOFOL N/A 04/21/2018   Procedure: ESOPHAGOGASTRODUODENOSCOPY (EGD) WITH PROPOFOL;  Surgeon: [facility], MD;  Location: [facility] ENDOSCOPY;  Service: Gastroenterology;  Laterality: N/A;   HIP PINNING Right    LOWER EXTREMITY ANGIOGRAPHY N/A 05/29/2021   Procedure: LOWER EXTREMITY ANGIOGRAPHY;  Surgeon: [facility], MD;  Location: [facility] INVASIVE CV LAB;  Service: Cardiovascular;  Laterality: N/A;   TOTAL HIP ARTHROPLASTY Right 10/22/2016   Procedure: TOTAL HIP ARTHROPLASTY ANTERIOR APPROACH;  Surgeon: Kennedy Bucker, MD;  Location: [facility] ORS;  Service: Orthopedics;  Laterality: Right;     Social History:   reports that he has been smoking cigarettes. He has a 4.00 pack-year smoking history. He has never used smokeless tobacco. He reports that he does not currently use drugs. He reports that he does not drink alcohol.   Family History:  His family history is not on file.   Allergies No Known Allergies   Home Medications  Prior to Admission medications    Medication Sig Start Date End Date Taking? Authorizing Provider  acetaminophen (TYLENOL) 500 MG tablet Take 1,000 mg by mouth every 6 (six) hours as needed (pain.).   Yes [provider]  amLODipine (NORVASC) 5 MG tablet Take 5 mg by mouth in the morning.   Yes [provider]  cyclobenzaprine (FLEXERIL) 10 MG tablet Take 10 mg by mouth every 8 (eight) hours as needed for muscle spasms. 05/24/21  Yes [provider]  HYDROcodone-acetaminophen (NORCO/VICODIN) 5-325 MG tablet Take 1 tablet by mouth every 6 (six) hours as needed (pain.). 05/21/21  Yes [provider]  lisinopril (ZESTRIL) 20 MG tablet Take 20 mg by mouth daily.   Yes [provider]  LYRICA 100 MG capsule Take 100 mg by mouth 3 (three) times daily. 05/21/21  Yes [provider]  meloxicam (MOBIC) 15 MG tablet Take 15 mg by mouth in the morning.   Yes [provider]  NEURONTIN 300 MG capsule Take 600 mg by mouth 2 (two) times daily. 04/05/21  Yes [provider]  nicotine (NICODERM CQ - DOSED IN MG/24 HOURS) 21 mg/24hr patch Place 21 mg onto the skin daily.   Yes [provider]  nicotine polacrilex (NICORETTE) 4 MG gum Take 4 mg by mouth as needed for smoking cessation.   Yes [provider]  Vitamin D, Ergocalciferol, (DRISDOL) 50000 units CAPS capsule Take 50,000 Units by mouth every Thursday.   Yes [provider]     Marcelo Baldy, MD 06/03/21 7:32 AM   [facility] If after hours, please call E-link

## 2021-06-03 NOTE — Progress Notes (Signed)
    Subjective  - POD #1, status post right femoral endarterectomy with patch angioplasty.  Postop day #3, status post aortobifemoral bypass graft  Right leg feels much better today.  Feels like he needs to pass flatus but has not   Physical Exam:  Midline and groin dressings are clean dry and intact. Brisk posterior tibial Doppler signals bilaterally       Assessment/Plan:  POD #3,1  GI: He has not passed gas yet.  I will let him have ice chips today Renal: Good urine output with normal creatinine Prophylaxis: On Lovenox and Protonix ID: No active issues Needs to get out of bed and ambulate Hemoglobin remained stable  Jeffrey Grant 06/03/2021 3:19 PM --  Vitals:   06/03/21 1400 06/03/21 1445  BP:  125/72  Pulse: (!) 111 (!) 118  Resp: 17 (!) 22  Temp:    SpO2: 99% 100%    Intake/Output Summary (Last 24 hours) at 06/03/2021 1519 Last data filed at 06/03/2021 1400 Gross per 24 hour  Intake 3627.22 ml  Output 1850 ml  Net 1777.22 ml     Laboratory CBC    Component Value Date/Time   WBC 14.2 (H) 06/03/2021 0338   HGB 9.0 (L) 06/03/2021 0338   HGB 14.4 03/30/2018 1404   HCT 26.2 (L) 06/03/2021 0338   HCT 42.9 03/30/2018 1404   PLT 141 (L) 06/03/2021 0338   PLT 321 03/30/2018 1404    BMET    Component Value Date/Time   NA 135 06/03/2021 0338   NA 140 03/30/2018 1404   K 4.0 06/03/2021 0338   CL 101 06/03/2021 0338   CO2 27 06/03/2021 0338   GLUCOSE 122 (H) 06/03/2021 0338   BUN 15 06/03/2021 0338   BUN 9 03/30/2018 1404   CREATININE 0.76 06/03/2021 0338   CALCIUM 8.4 (L) 06/03/2021 0338   GFRNONAA >60 06/03/2021 0338   GFRAA 129 03/30/2018 1404    COAG Lab Results  Component Value Date   INR 0.9 06/01/2021   INR 1.0 05/29/2021   INR 0.91 10/09/2016   No results found for: PTT  Antibiotics Anti-infectives (From admission, onward)    Start     Dose/Rate Route Frequency Ordered Stop   06/01/21 2100  ceFAZolin (ANCEF) IVPB 2g/100 mL  premix        2 g 200 mL/hr over 30 Minutes Intravenous Every 8 hours 06/01/21 1443 06/02/21 0659   06/01/21 0600  ceFAZolin (ANCEF) IVPB 2g/100 mL premix        2 g 200 mL/hr over 30 Minutes Intravenous On call to O.R. 05/31/21 2238 06/01/21 1245   05/29/21 0645  ceFAZolin (ANCEF) IVPB 2g/100 mL premix        2 g 200 mL/hr over 30 Minutes Intravenous  Once 05/29/21 0636 05/29/21 2109        V. Charlena Cross, M.D., [facility] [place] Office: [phone] Pager:  [phone]

## 2021-06-03 NOTE — Evaluation (Signed)
Occupational Therapy Evaluation Patient Details Name: Jeffrey Grant. MRN: [id number] DOB: 08/01/1980 Today's Date: 06/03/2021   History of Present Illness Pt is a 40 y/o M admitted on 06/01/21 wih c/c of BLE pain at rest. Pt is being treated for Severe Bilateral Peripheral Artery Disease with rest pain and ulceration of the left foot, s/p Aortobifemoral bypass Graft, and Bilateral femoral artery endarterectomies on 06/01/21. Pt returned to OR for Right superficial femoral endarterectomy on 06/02/21. PMH: anxiety, back pain, HTN, seropositive RA   Clinical Impression   Pt seen for OT evaluation this date. Upon arrival to room, pt seated upright in recliner following session with PT. Pt reporting 6/10 abdominal pain (increasing to 8/10 at end of session), however motivated to participate in OT eval/tx. Prior to admission, pt was independent in all ADLs and functional mobility, living in a 1-story home with wife and children. Pt drives and enjoys playing music. Pt currently presents with increased pain, decreased balance, decreased strength, and decreased activity tolerance. Due to these functional impairments, pt requires MIN A for seated LB dressing, MIN GUARD for sit<>stand transfers, MIN GUARD for standing grooming tasks, and MIN GUARD for standing UB bathing. Pt would benefit from additional skilled OT services to improve activity tolerance/strength and maximize return to PLOF. Upon discharge, recommend HHOT services.    Recommendations for follow up therapy are one component of a multi-disciplinary discharge planning process, led by the attending physician.  Recommendations may be updated based on patient status, additional functional criteria and insurance authorization.   Follow Up Recommendations  Home health OT    Assistance Recommended at Discharge Intermittent Supervision/Assistance  Functional Status Assessment  Patient has had a recent decline in their functional status and  demonstrates the ability to make significant improvements in function in a reasonable and predictable amount of time.  Equipment Recommendations  BSC/3in1       Precautions / Restrictions Precautions Precautions: Fall Precaution Comments: log roll for comfort 2/2 significant abdominal incisions Restrictions Weight Bearing Restrictions: No      Mobility Bed Mobility Overal bed mobility: Needs Assistance Bed Mobility: Rolling;Sidelying to Sit Rolling: Min guard Sidelying to sit: Min guard       General bed mobility comments: not assessed, pt in recliner at beginning/end of session    Transfers Overall transfer level: Needs assistance Equipment used: None Transfers: Sit to/from Stand;Bed to chair/wheelchair/BSC Sit to Stand: Min guard Stand pivot transfers: Min assist (slow, guarded, follows cuing for technique & turning to sit in recliner)         General transfer comment: Some unsteadiness upon initial standing however able to regain balance after holding on to table      Balance Overall balance assessment: Needs assistance Sitting-balance support: Feet supported;Bilateral upper extremity supported Sitting balance-Leahy Scale: Fair Sitting balance - Comments: Requires supervision for reachin within BOS during LB dressing   Standing balance support: No upper extremity supported;During functional activity Standing balance-Leahy Scale: Fair Standing balance comment: Requires MIN GUARD while engaging in standing grooming tasks                           ADL either performed or assessed with clinical judgement   ADL Overall ADL's : Needs assistance/impaired     Grooming: Wash/dry hands;Wash/dry face;Applying deodorant;Min guard;Standing Grooming Details (indicate cue type and reason): Requires MIN GUARD d/t decreased balance Upper Body Bathing: Min guard;Standing           Lower  Body Dressing: Minimal assistance;Sitting/lateral leans Lower Body  Dressing Details (indicate cue type and reason): To don/doff socks via figure-4 position. Requires MIN A to thread over L toes d/t abdominal pain when bending over             Functional mobility during ADLs: Min guard       Vision Baseline Vision/History: 0 No visual deficits Ability to See in Adequate Light: 0 Adequate Patient Visual Report: No change from baseline              Pertinent Vitals/Pain Pain Assessment: 0-10 Pain Score: 8  Faces Pain Scale: Hurts worst Pain Location: abdominal incision Pain Descriptors / Indicators: Grimacing;Guarding Pain Intervention(s): Limited activity within patient's tolerance;Monitored during session;Premedicated before session;Repositioned        Extremity/Trunk Assessment Upper Extremity Assessment Upper Extremity Assessment: Generalized weakness   Lower Extremity Assessment Lower Extremity Assessment: Generalized weakness LLE Deficits / Details: large blister noted to dorsal aspect of L foot - nurse aware       Communication Communication Communication: No difficulties   Cognition Arousal/Alertness: Awake/alert Behavior During Therapy: WFL for tasks assessed/performed Overall Cognitive Status: Within Functional Limits for tasks assessed                                 General Comments: Motivated to participate                Home Living Family/patient expects to be discharged to:: Private residence Living Arrangements: Spouse/significant other;Children (adult children) Available Help at Discharge: Family;Available 24 hours/day Type of Home: House Home Access: Stairs to enter [employer] of Steps: 2 Entrance Stairs-Rails: Right Home Layout: One level     Bathroom Shower/Tub: Tub/shower unit         Home Equipment: Shower seat          Prior Functioning/Environment Prior Level of Function : Independent/Modified Independent             Mobility Comments: Pt reports he was  independent without AD for functional mobility ADLs Comments: Pt reports he was independent with ADLs, driving, and playing music        OT Problem List: Decreased strength;Decreased activity tolerance;Impaired balance (sitting and/or standing);Pain      OT Treatment/Interventions:      OT Goals(Current goals can be found in the care plan section) Acute Rehab OT Goals Patient Stated Goal: to get back home OT Goal Formulation: With patient Time For Goal Achievement: 06/17/21 Potential to Achieve Goals: Good ADL Goals Pt Will Perform Grooming: with supervision;standing (will complete x4 grooming tasks) Pt Will Transfer to Toilet: with supervision;ambulating;bedside commode Pt Will Perform Toileting - Clothing Manipulation and hygiene: with supervision;sitting/lateral leans  OT Frequency: Min 2X/week    AM-PAC OT "6 Clicks" Daily Activity     Outcome Measure Help from another person eating meals?: None Help from another person taking care of personal grooming?: A Little Help from another person toileting, which includes using toliet, bedpan, or urinal?: A Lot Help from another person bathing (including washing, rinsing, drying)?: A Lot Help from another person to put on and taking off regular upper body clothing?: A Little Help from another person to put on and taking off regular lower body clothing?: A Little 6 Click Score: 17   End of Session Nurse Communication: Mobility status  Activity Tolerance: Patient tolerated treatment well Patient left: in chair;with call bell/phone within reach;with family/visitor present  OT Visit Diagnosis: Muscle weakness (generalized) (M62.81)                Time: 6073-7106 OT Time Calculation (min): 18 min Charges:  OT General Charges $OT Visit: 1 Visit OT Evaluation $OT Eval Moderate Complexity: 1 Mod OT Treatments $Self Care/Home Management : 8-22 mins  Matthew Folks, OTR/L [facility] [phone]

## 2021-06-03 NOTE — Progress Notes (Signed)
Patient arrived to unit from ICU. Pain medications given and IV fluids hung for maintenance. Patient in bed resting comfortable with telemetry in place.

## 2021-06-03 NOTE — Evaluation (Signed)
Physical Therapy Evaluation Patient Details Name: Jeffrey Grant. MRN: [id number] DOB: 09/22/1980 Today's Date: 06/03/2021  History of Present Illness  Pt is a 40 y/o M admitted on 06/01/21 wih c/c of BLE pain at rest. Pt is being treated for Severe Bilateral Peripheral Artery Disease with rest pain and ulceration of the left foot, s/p Aortobifemoral bypass Graft, and Bilateral femoral artery endarterectomies on 06/01/21. Pt returned to OR for Right superficial femoral endarterectomy on 06/02/21. PMH: anxiety, back pain, HTN, seropositive RA  Clinical Impression  MD cleared pt for participation in session. Pt received in bed with wife present for session, nurse administering pain medication. Pt motivated to participate but clearly in a lot of visible pain throughout session. PT educates pt on log rolling & pt able to perform with use of bed rails & extra time. Pt is able to sit EOB ~3 minutes without physical assistance, sit>stand & stand pivot with UE HHA & min assist. Pt declines gait on this date 2/2 significant abdominal pain. Encouraged pt to sit in recliner as much as possible & educated pt & wife on f/u therapy & DME recommendations. Will continue to follow pt acutely to progress mobility as able.     Recommendations for follow up therapy are one component of a multi-disciplinary discharge planning process, led by the attending physician.  Recommendations may be updated based on patient status, additional functional criteria and insurance authorization.  Follow Up Recommendations Home health PT    Assistance Recommended at Discharge Frequent or constant Supervision/Assistance  Functional Status Assessment Patient has had a recent decline in their functional status and demonstrates the ability to make significant improvements in function in a reasonable and predictable amount of time.  Equipment Recommendations  Rolling walker (2 wheels);BSC/3in1    Recommendations for Other Services        Precautions / Restrictions Precautions Precautions: Fall Precaution Comments: log roll for comfort 2/2 significant abdominal incisions Restrictions Weight Bearing Restrictions: No      Mobility  Bed Mobility Overal bed mobility: Needs Assistance Bed Mobility: Rolling;Sidelying to Sit Rolling: Min guard Sidelying to sit: Min guard       General bed mobility comments: cuing for log rolling & use of bed rails, extra time    Transfers Overall transfer level: Needs assistance Equipment used: 1 person hand held assist Transfers: Sit to/from Stand;Bed to chair/wheelchair/BSC Sit to Stand: Min assist Stand pivot transfers: Min assist (slow, guarded, follows cuing for technique & turning to sit in recliner)              Ambulation/Gait Ambulation/Gait assistance:  (pt declines 2/2 pain)                Stairs            Wheelchair Mobility    Modified Rankin (Stroke Patients Only)       Balance Overall balance assessment: Needs assistance Sitting-balance support: Feet supported;Bilateral upper extremity supported Sitting balance-Leahy Scale: Fair Sitting balance - Comments: supervision static sitting   Standing balance support: Single extremity supported;During functional activity Standing balance-Leahy Scale: Poor                               Pertinent Vitals/Pain Pain Assessment: Faces Faces Pain Scale: Hurts worst Pain Location: abdominal incision Pain Descriptors / Indicators: Grimacing;Guarding Pain Intervention(s): Limited activity within patient's tolerance;RN gave pain meds during session;Monitored during session;Repositioned    Home Living Family/patient  expects to be discharged to:: Private residence Living Arrangements: Spouse/significant other;Children (adult children) Available Help at Discharge: Family;Available 24 hours/day Type of Home: House Home Access: Stairs to enter Entrance Stairs-Rails: Right Entrance  Stairs-Number of Steps: 2   Home Layout: One level        Prior Function Prior Level of Function : Independent/Modified Independent             Mobility Comments: Pt reports he was independent without AD, driving, not working.       Hand Dominance        Extremity/Trunk Assessment   Upper Extremity Assessment Upper Extremity Assessment: Generalized weakness    Lower Extremity Assessment Lower Extremity Assessment: LLE deficits/detail (2+/5 knee extension in sitting but potentially limited by pain, no buckling noted in standing) LLE Deficits / Details: large blister noted to dorsal aspect of L foot - nurse aware       Communication   Communication: No difficulties  Cognition Arousal/Alertness: Awake/alert Behavior During Therapy: WFL for tasks assessed/performed Overall Cognitive Status: Within Functional Limits for tasks assessed                                 General Comments: Motivated to participate, voices understanding of all education/info        General Comments General comments (skin integrity, edema, etc.): BP 121/81 mmHg MAP 94 sitting EOB, pt denies dizziness    Exercises     Assessment/Plan    PT Assessment Patient needs continued PT services  PT Problem List Decreased strength;Decreased mobility;Decreased activity tolerance;Decreased skin integrity;Decreased balance;Decreased knowledge of use of DME;Pain       PT Treatment Interventions DME instruction;Therapeutic exercise;Gait training;Balance training;Stair training;Neuromuscular re-education;Functional mobility training;Therapeutic activities;Patient/family education;Modalities    PT Goals (Current goals can be found in the Care Plan section)  Acute Rehab PT Goals Patient Stated Goal: decreased pain PT Goal Formulation: With patient Time For Goal Achievement: 06/17/21 Potential to Achieve Goals: Good    Frequency Min 2X/week   Barriers to discharge         Co-evaluation               AM-PAC PT "6 Clicks" Mobility  Outcome Measure Help needed turning from your back to your side while in a flat bed without using bedrails?: A Little Help needed moving from lying on your back to sitting on the side of a flat bed without using bedrails?: A Little Help needed moving to and from a bed to a chair (including a wheelchair)?: A Little Help needed standing up from a chair using your arms (e.g., wheelchair or bedside chair)?: A Little Help needed to walk in hospital room?: A Lot Help needed climbing 3-5 steps with a railing? : A Lot 6 Click Score: 16    End of Session   Activity Tolerance: Patient limited by pain Patient left: in chair;with family/visitor present Nurse Communication: Mobility status (pain) PT Visit Diagnosis: Unsteadiness on feet (R26.81);Difficulty in walking, not elsewhere classified (R26.2);Muscle weakness (generalized) (M62.81);Pain Pain - part of body:  (abdomen)    Time: 1829-9371 PT Time Calculation (min) (ACUTE ONLY): 23 min   Charges:   PT Evaluation $PT Eval High Complexity: 1 High          Aleda Grana, PT, DPT 06/03/21, 9:28 AM   Sandi Mariscal 06/03/2021, 9:26 AM

## 2021-06-04 ENCOUNTER — Encounter: Payer: Self-pay | Admitting: Surgery

## 2021-06-04 DIAGNOSIS — I70299 Other atherosclerosis of native arteries of extremities, unspecified extremity: Secondary | ICD-10-CM | POA: Diagnosis not present

## 2021-06-04 DIAGNOSIS — L97909 Non-pressure chronic ulcer of unspecified part of unspecified lower leg with unspecified severity: Secondary | ICD-10-CM | POA: Diagnosis not present

## 2021-06-04 LAB — TYPE AND SCREEN
ABO/RH(D): O POS
Antibody Screen: NEGATIVE
Unit division: 0
Unit division: 0

## 2021-06-04 LAB — BPAM RBC
Blood Product Expiration Date: 202301092359
Blood Product Expiration Date: 202301092359
Unit Type and Rh: 5100
Unit Type and Rh: 5100

## 2021-06-04 LAB — BASIC METABOLIC PANEL
Anion gap: 8 (ref 5–15)
BUN: 18 mg/dL (ref 6–20)
CO2: 23 mmol/L (ref 22–32)
Calcium: 8.3 mg/dL — ABNORMAL LOW (ref 8.9–10.3)
Chloride: 106 mmol/L (ref 98–111)
Creatinine, Ser: 0.75 mg/dL (ref 0.61–1.24)
GFR, Estimated: >60 mL/min (ref >60)
Glucose, Bld: 105 mg/dL — ABNORMAL HIGH (ref 70–99)
Potassium: 3.6 mmol/L (ref 3.5–5.1)
Sodium: 137 mmol/L (ref 135–145)

## 2021-06-04 LAB — CBC
HCT: 24.7 % — ABNORMAL LOW (ref 39.0–52.0)
Hemoglobin: 8.4 g/dL — ABNORMAL LOW (ref 13.0–17.0)
MCH: 29.7 pg (ref 26.0–34.0)
MCHC: 34 g/dL (ref 30.0–36.0)
MCV: 87.3 fL (ref 80.0–100.0)
Platelets: 139 10*3/uL — ABNORMAL LOW (ref 150–400)
RBC: 2.83 MIL/uL — ABNORMAL LOW (ref 4.22–5.81)
RDW: 15.4 % (ref 11.5–15.5)
WBC: 10.9 10*3/uL — ABNORMAL HIGH (ref 4.0–10.5)
nRBC: 0 % (ref 0.0–0.2)

## 2021-06-04 LAB — PREPARE RBC (CROSSMATCH)

## 2021-06-04 LAB — SURGICAL PATHOLOGY

## 2021-06-04 MED ORDER — ASPIRIN EC 81 MG PO TBEC
81.0000 mg | DELAYED_RELEASE_TABLET | Freq: Every day | ORAL | Status: DC
  Administered 2021-06-04 – 2021-06-05 (×2): 81 mg via ORAL
  Filled 2021-06-04 (×2): qty 1

## 2021-06-04 MED ORDER — ATORVASTATIN CALCIUM 20 MG PO TABS
80.0000 mg | ORAL_TABLET | Freq: Every day | ORAL | Status: DC
  Administered 2021-06-04 – 2021-06-05 (×2): 80 mg via ORAL
  Filled 2021-06-04 (×2): qty 4

## 2021-06-04 MED ORDER — POLYETHYLENE GLYCOL 3350 17 G PO PACK
17.0000 g | PACK | Freq: Every day | ORAL | Status: DC
  Administered 2021-06-04: 17 g via ORAL
  Filled 2021-06-04 (×2): qty 1

## 2021-06-04 MED ORDER — GABAPENTIN 100 MG PO CAPS
200.0000 mg | ORAL_CAPSULE | Freq: Two times a day (BID) | ORAL | Status: DC
  Administered 2021-06-04 – 2021-06-05 (×2): 200 mg via ORAL
  Filled 2021-06-04 (×2): qty 2

## 2021-06-04 MED ORDER — CLOPIDOGREL BISULFATE 75 MG PO TABS
75.0000 mg | ORAL_TABLET | Freq: Every day | ORAL | Status: DC
  Administered 2021-06-04 – 2021-06-05 (×2): 75 mg via ORAL
  Filled 2021-06-04 (×2): qty 1

## 2021-06-04 MED ORDER — SENNA 8.6 MG PO TABS
1.0000 | ORAL_TABLET | Freq: Every day | ORAL | Status: DC
  Administered 2021-06-04 – 2021-06-05 (×2): 8.6 mg via ORAL
  Filled 2021-06-04 (×2): qty 1

## 2021-06-04 NOTE — Progress Notes (Signed)
Physical Therapy Treatment Patient Details Name: Jeffrey Grant. MRN: [id number] DOB: 1980-11-05 Today's Date: 06/04/2021   History of Present Illness Pt is a 40 y/o M admitted on 06/01/21 wih c/c of BLE pain at rest. Pt is being treated for Severe Bilateral Peripheral Artery Disease with rest pain and ulceration of the left foot, s/p Aortobifemoral bypass Graft, and Bilateral femoral artery endarterectomies on 06/01/21. Pt returned to OR for Right superficial femoral endarterectomy on 06/02/21. PMH: anxiety, back pain, HTN, seropositive RA    PT Comments    Pt was pleasant and motivated to participate during the session and put forth good effort throughout. Pt taken through graded therex and then graded ambulation on room air with no adverse symptoms noted other than 5/10 abdominal pain that did not worsen with activity.  Pt's gait was unsteady with slow cadence and short B step length during initial ambulation without an AD.  Pt then utilized a RW for the last two longer walks with significant improvements in stability, cadence, and step length noted.  Pt's SpO2 and HR were measured frequently during the session and both were WNL throughout.  Pt will benefit from HHPT upon discharge to safely address deficits listed in patient problem list for decreased caregiver assistance and eventual return to PLOF.     Recommendations for follow up therapy are one component of a multi-disciplinary discharge planning process, led by the attending physician.  Recommendations may be updated based on patient status, additional functional criteria and insurance authorization.  Follow Up Recommendations  Home health PT     Assistance Recommended at Discharge Frequent or constant Supervision/Assistance  Equipment Recommendations  Rolling walker (2 wheels);BSC/3in1    Recommendations for Other Services       Precautions / Restrictions Precautions Precautions: Fall Precaution Comments: log roll for comfort  2/2 significant abdominal incisions Restrictions Weight Bearing Restrictions: No     Mobility  Bed Mobility               General bed mobility comments: NT, pt in recliner    Transfers Overall transfer level: Needs assistance Equipment used: None Transfers: Sit to/from Stand Sit to Stand: Supervision           General transfer comment: Fair to good eccentric control and fair concentric control both with use of BUEs for support    Ambulation/Gait Ambulation/Gait assistance: Min guard Gait Distance (Feet): 12 Feet x 1, 80 feet x 1, 150 feet x 1 Assistive device: Rolling walker (2 wheels);None Gait Pattern/deviations: Step-through pattern;Decreased step length - right;Decreased step length - left;Steppage Gait velocity: decreased     General Gait Details: Min instability that the pt was able to self-correct during amb without an AD with pt often reaching out with UE's for support; improved stability and gait quality with use of a RW including increased step length and cadence   Stairs             Wheelchair Mobility    Modified Rankin (Stroke Patients Only)       Balance Overall balance assessment: Needs assistance Sitting-balance support: Feet supported;Bilateral upper extremity supported Sitting balance-Leahy Scale: Good     Standing balance support: Bilateral upper extremity supported;No upper extremity supported;During functional activity Standing balance-Leahy Scale: Fair                              Cognition Arousal/Alertness: Awake/alert Behavior During Therapy: WFL for tasks assessed/performed Overall Cognitive  Status: Within Functional Limits for tasks assessed                                          Exercises Total Joint Exercises Ankle Circles/Pumps: AROM;Strengthening;Both;10 reps;15 reps (minimal AROM on the LLE, improved from recent baseline per patient) Long Arc Quad: AROM;Strengthening;Both;10  reps;5 reps Knee Flexion: 10 reps;5 reps;Both;Strengthening;AROM Marching in Standing: AROM;Strengthening;Both;10 reps;Seated Other Exercises Other Exercises: HEP education for BLE APs, QS, and LAQs x 10 every 1-2 hours daily    General Comments        Pertinent Vitals/Pain Pain Assessment: 0-10 Pain Score: 5  Pain Location: abdominal incision Pain Descriptors / Indicators: Sore Pain Intervention(s): Repositioned;Premedicated before session;Monitored during session    Home Living                          Prior Function            PT Goals (current goals can now be found in the care plan section) Progress towards PT goals: Progressing toward goals    Frequency    Min 2X/week      PT Plan Current plan remains appropriate    Co-evaluation              AM-PAC PT "6 Clicks" Mobility   Outcome Measure  Help needed turning from your back to your side while in a flat bed without using bedrails?: A Little Help needed moving from lying on your back to sitting on the side of a flat bed without using bedrails?: A Little Help needed moving to and from a bed to a chair (including a wheelchair)?: A Little Help needed standing up from a chair using your arms (e.g., wheelchair or bedside chair)?: A Little Help needed to walk in hospital room?: A Little Help needed climbing 3-5 steps with a railing? : A Little 6 Click Score: 18    End of Session Equipment Utilized During Treatment: Gait belt Activity Tolerance: Patient tolerated treatment well Patient left: in chair;with nursing/sitter in room;with call bell/phone within reach Nurse Communication: Mobility status PT Visit Diagnosis: Unsteadiness on feet (R26.81);Difficulty in walking, not elsewhere classified (R26.2);Muscle weakness (generalized) (M62.81);Pain     Time: 6384-5364 PT Time Calculation (min) (ACUTE ONLY): 23 min  Charges:  $Gait Training: 8-22 mins $Therapeutic Exercise: 8-22 mins                      D. Scott Kareem Aul PT, DPT 06/04/21, 4:02 PM

## 2021-06-04 NOTE — Plan of Care (Signed)

## 2021-06-04 NOTE — TOC Initial Note (Addendum)
Transition of Care (TOC) - Initial/Assessment Note    Patient Details  Name: Jeffrey Grant. MRN: [id number] Date of Birth: 09-29-1980  Transition of Care [facility]) CM/SW Contact:    Chapman Fitch, RN Phone Number: 06/04/2021, 10:34 AM  Clinical Narrative:                  Admitted [id number] with severe symptomatic b/l lower extremity PAD. S/p two procedures with vascular surgery Admitted from: Home with wife and children TDD:[facility] Current home health/prior home health/DME: no home health or DME  PT and OT recommending home health.  Patient agreeable.  States he does not have a preference of home health agency. Patient aware that it may be a barrier securing services due to his insurance.  Barbara Cower with [facility] review.  Referral made to [facility] with adapt for RW and [facility], to be delivered to room prior to discharge   Expected Discharge Plan: Home w Home Health Services Barriers to Discharge: Continued Medical Work up   Patient Goals and CMS Choice     Choice offered to / list presented to : Patient  Expected Discharge Plan and Services Expected Discharge Plan: Home w Home Health Services   Discharge Planning Services: CM Consult Post Acute Care Choice: Home Health Living arrangements for the past 2 months: Single Family Home                 DME Arranged: Walker rolling, 3-N-1                    Prior Living Arrangements/Services Living arrangements for the past 2 months: Single Family Home Lives with:: Adult Children, Spouse Patient language and need for interpreter reviewed:: Yes Do you feel safe going back to the place where you live?: Yes      Need for Family Participation in Patient Care: Yes (Comment) Care giver support system in place?: Yes (comment)   Criminal Activity/Legal Involvement Pertinent to Current Situation/Hospitalization: No - Comment as needed  Activities of Daily Living Home  Assistive Devices/Equipment: None ADL Screening (condition at time of admission) Patient's cognitive ability adequate to safely complete daily activities?: Yes Is the patient deaf or have difficulty hearing?: No Does the patient have difficulty seeing, even when wearing glasses/contacts?: No Does the patient have difficulty concentrating, remembering, or making decisions?: No Patient able to express need for assistance with ADLs?: Yes Does the patient have difficulty dressing or bathing?: No Independently performs ADLs?: Yes (appropriate for developmental age) Does the patient have difficulty walking or climbing stairs?: No Weakness of Legs: Both Weakness of Arms/Hands: None  Permission Sought/Granted                  Emotional Assessment       Orientation: : Oriented to Self, Oriented to Place, Oriented to  Time, Oriented to Situation Alcohol / Substance Use: Not Applicable Psych Involvement: No (comment)  Admission diagnosis:  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HCC) [K27.062, L97.909] Patient Active Problem List   Diagnosis Date Noted   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HCC) 05/29/2021   Inflammatory pain 03/28/2021   Low back pain 03/28/2021   Cervical radiculopathy 03/28/2021   Rheumatoid arthritis (HCC) 03/28/2021   High risk medications (not anticoagulants) long-term use 10/19/2019   Rash and nonspecific skin eruption 10/19/2019   Sjogren's disease (HCC) 10/19/2019   Depression 04/07/2018   Enlarged prostate 04/07/2018  Schizophrenia (HCC) 04/07/2018   Simple renal cyst 04/07/2018   Sleep apnea 04/07/2018   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   Leg pain 10/14/2016   Atherosclerotic peripheral vascular disease with intermittent claudication (HCC) 09/18/2015   Abnormal electrocardiogram 09/01/2015   Joint pain 09/19/2014   Nummular eczema 09/19/2014   Positive ANA (antinuclear antibody) 09/19/2014   Impetigo  01/31/2012   Hyperlipidemia 03/14/2010   Current smoker 03/28/2008   Anxiety disorder 05/01/2006   Benign essential hypertension 05/01/2006   Insomnia 05/01/2006   PCP:  [facility] Pharmacy:   [facility] [place], [place] - [address] [place] [address] Phone: [phone] Fax: [phone]     Social Determinants of Health (SDOH) Interventions    Readmission Risk Interventions No flowsheet data found.

## 2021-06-04 NOTE — Progress Notes (Signed)
[facility] Daily Progress Note  06/02/21: Right superficial femoral endarterectomy with CorMatrix patch angioplasty  06/01/21: Aortobifemoral bypass grafting with 12 x 6 bifurcated dacryon graft 2.   Right common femoral endarterectomy Right superficial femoral endarterectomy Left common femoral endarterectomy Left superficial femoral endarterectomy  Subjective: Patient without complaint this PM.  No acute issues overnight.  Denies any discomfort to the bilateral legs.  Objective: Vitals:   06/03/21 1800 06/03/21 2027 06/04/21 0438 06/04/21 0808  BP:  (!) 124/59 129/75 138/70  Pulse: (!) 104 (!) 107 (!) 107 (!) 105  Resp:  16 16 16   Temp:  98.5 F (36.9 C) 98.5 F (36.9 C) 98 F (36.7 C)  TempSrc:  Oral Oral Oral  SpO2:  99% 99% 100%  Weight:      Height:        Intake/Output Summary (Last 24 hours) at 06/04/2021 1245 Last data filed at 06/04/2021 14/05/2021 Gross per 24 hour  Intake 795.65 ml  Output 1100 ml  Net -304.35 ml   Physical Exam: A&Ox3, NAD CV: RRR Pulmonary: CTA Bilaterally Abdomen:  - Incision: Dressing is clean dry and intact - Soft, Nontender, Nondistended Right groin: - Incision: Dressing is clean dry and intact Left groin: - Incision: Dressing is clean dry and intact Vascular: Right lower extremity: -Thigh soft.  Calf soft.  Extremities warm distally to toes.  Foot is warm.  Present sensory is intact. Left lower extremity: Thigh soft.  Calf soft.  Extremities warm distally to toes.  Foot is warm.  Present sensory is intact.   Laboratory: CBC    Component Value Date/Time   WBC 10.9 (H) 06/04/2021 0642   HGB 8.4 (L) 06/04/2021 0642   HGB 14.4 03/30/2018 1404   HCT 24.7 (L) 06/04/2021 0642   HCT 42.9 03/30/2018 1404   PLT 139 (L) 06/04/2021 0642   PLT 321 03/30/2018 1404   BMET    Component Value Date/Time   NA 137 06/04/2021 0642   NA 140 03/30/2018 1404   K 3.6 06/04/2021 0642   CL 106 06/04/2021 0642   CO2 23  06/04/2021 0642   GLUCOSE 105 (H) 06/04/2021 0642   BUN 18 06/04/2021 0642   BUN 9 03/30/2018 1404   CREATININE 0.75 06/04/2021 0642   CALCIUM 8.3 (L) 06/04/2021 0642   GFRNONAA >60 06/04/2021 0642   GFRAA 129 03/30/2018 1404   Assessment/Planning: The patient is a 40 year old male who presented with ischemic rest pain as well as ulcerations to the left foot status post aortobifemoral bypass (12/9) with return to the operating room the following day and underwent a right superficial femoral endarterectomy with CorMatrix patch due to a 5 cm segment occlusion of the proximal superficial femoral artery (12/10)   1) Overall the patient is doing well.  He has been sitting in a chair for most of the day with minimal discomfort. 2) Less than a gram drop in hemoglobin.  Asymptomatic.  AM CBC 3) Creatinine is normal 4) Incisions are clean dry and intact.  Will remove dressings tomorrow. 5) PT and OT are recommending home health services 6) On aspirin, statin and Plavix for medical management 7) Would suspect that the patient should be discharging home within the next day or two  Discussed with Dr. 10-04-1992 [facility] PA-C 06/04/2021 12:45 PM

## 2021-06-04 NOTE — Progress Notes (Signed)
Patient ambulating in the hall with PT.

## 2021-06-04 NOTE — Progress Notes (Addendum)
PROGRESS NOTE    Jeffrey Grant.  WUJ:[id number] DOB: 1981-06-12 DOA: 05/29/2021 PCP: [facility]  Outpatient Specialists: rheumatology, cardiology, neurology    Brief Narrative:  Admitted with severe symptomatic b/l lower extremity PAD. S/p two procedures with vascular surgery. Treated initially in the ICU for severe htn. Transferred to hospitalist service 12/12.   Assessment & Plan:   Principal Problem:   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HCC)  # PAD Severe, symptomatic. S/p aorto-bifemoral bypass graft on 12/9 and right femoral endarterectomy w/ patch angioplasty on 12/10. Healing well. Leg pain resolved. Feet are warm. - tx per vascular - advancing diet - start aspirin/plavix and atorvastatin - pain control and bowel regimen - decrease gabapentin from 600 to 200 bid, was prescribed for neuropathic pain but in retrospect appears pain was mainly claudication - PT/OT advising home health, will need to query patient if he wants this at d/c  # HTN Hypertensive urgency in ICU treated with clonidine now weaned off. Bp wnl currently - cont amlodipine 10 (from home 5) - cont home lisinopril 20  # Rheumatoid arthritis Followed by [facility] as outpt. Not currently on meds - outpt f/u  # Tobacco abuse Declines patch. Motivated to quit     DVT prophylaxis: lovenox Code Status: full Family Communication: none @ bedside, declines my offer to call wife  Level of care: Med-Surg Status is: Inpatient  Remains inpatient appropriate because: inadequate PO        Consultants:  Vascular surgery  Procedures: See above  Antimicrobials:  none    Subjective: No pain, passing flatus, no vomiting  Objective: Vitals:   06/03/21 1800 06/03/21 2027 06/04/21 0438 06/04/21 0808  BP:  (!) 124/59 129/75 138/70  Pulse: (!) 104 (!) 107 (!) 107 (!) 105  Resp:  16 16 16   Temp:  98.5 F (36.9 C) 98.5 F (36.9 C) 98 F (36.7 C)   TempSrc:  Oral Oral Oral  SpO2:  99% 99% 100%  Weight:      Height:        Intake/Output Summary (Last 24 hours) at 06/04/2021 1007 Last data filed at 06/04/2021 0828 Gross per 24 hour  Intake 795.65 ml  Output 1100 ml  Net -304.35 ml   Filed Weights   05/29/21 0728 06/01/21 0656  Weight: 72.6 kg 73 kg    Examination:  General exam: Appears calm and comfortable  Respiratory system: Clear to auscultation. Respiratory effort normal. Cardiovascular system: S1 & S2 heard, RRR. No JVD, murmurs, rubs, gallops or clicks. No pedal edema. Gastrointestinal system: Abdomen is nondistended, soft and mildly tender. Dressed midline incision Central nervous system: Alert and oriented. No focal neurological deficits. Extremities: Symmetric 5 x 5 power. warm Skin: dressed ulcer on foot Psychiatry: Judgement and insight appear normal. Mood & affect appropriate.     Data Reviewed: I have personally reviewed following labs and imaging studies  CBC: Recent Labs  Lab 06/01/21 0204 06/01/21 1517 06/02/21 0513 06/03/21 0338 06/04/21 0642  WBC 8.5 12.1* 14.5* 14.2* 10.9*  HGB 13.6 11.0* 11.0* 9.0* 8.4*  HCT 41.0 32.7* 32.3* 26.2* 24.7*  MCV 87.2 87.7 85.4 87.0 87.3  PLT 274 194 195 141* 139*   Basic Metabolic Panel: Recent Labs  Lab 06/01/21 0204 06/01/21 1517 06/01/21 1702 06/02/21 0513 06/03/21 0338 06/04/21 0642  NA 137  --  132* 134* 135 137  K 4.5  --  4.7 4.1 4.0 3.6  CL 100  --  100 101  101 106  CO2 28  --  24 26 27 23   GLUCOSE 134*  --  162* 148* 122* 105*  BUN 20  --  16 15 15 18   CREATININE 0.76 0.75 0.71 0.75 0.76 0.75  CALCIUM 9.6  --  8.6* 8.5* 8.4* 8.3*  MG 2.5*  --   --  2.0 2.1  --   PHOS  --   --   --  3.8 2.9  --    GFR: Estimated Creatinine Clearance: 117.6 mL/min (by C-G formula based on SCr of 0.75 mg/dL). Liver Function Tests: No results for input(s): AST, ALT, ALKPHOS, BILITOT, PROT, ALBUMIN in the last 168 hours. No results for input(s): LIPASE,  AMYLASE in the last 168 hours. No results for input(s): AMMONIA in the last 168 hours. Coagulation Profile: Recent Labs  Lab 05/29/21 1607 06/01/21 0204  INR 1.0 0.9   Cardiac Enzymes: No results for input(s): CKTOTAL, CKMB, CKMBINDEX, TROPONINI in the last 168 hours. BNP (last 3 results) No results for input(s): PROBNP in the last 8760 hours. HbA1C: No results for input(s): HGBA1C in the last 72 hours. CBG: Recent Labs  Lab 06/01/21 1313 06/01/21 1433  GLUCAP 171* 171*   Lipid Profile: No results for input(s): CHOL, HDL, LDLCALC, TRIG, CHOLHDL, LDLDIRECT in the last 72 hours. Thyroid Function Tests: No results for input(s): TSH, T4TOTAL, FREET4, T3FREE, THYROIDAB in the last 72 hours. Anemia Panel: No results for input(s): VITAMINB12, FOLATE, FERRITIN, TIBC, IRON, RETICCTPCT in the last 72 hours. Urine analysis:    Component Value Date/Time   COLORURINE YELLOW (A) 04/09/2021 0734   APPEARANCEUR HAZY (A) 04/09/2021 0734   LABSPEC 1.018 04/09/2021 0734   PHURINE 5.0 04/09/2021 0734   GLUCOSEU NEGATIVE 04/09/2021 0734   HGBUR SMALL (A) 04/09/2021 0734   BILIRUBINUR NEGATIVE 04/09/2021 0734   KETONESUR NEGATIVE 04/09/2021 0734   PROTEINUR NEGATIVE 04/09/2021 0734   NITRITE NEGATIVE 04/09/2021 0734   LEUKOCYTESUR NEGATIVE 04/09/2021 0734   Sepsis Labs: @LABRCNTIP (procalcitonin:4,lacticidven:4)  ) Recent Results (from the past 240 hour(s))  Resp Panel by RT-PCR (Flu A&B, Covid) Nasopharyngeal Swab     Status: None   Collection Time: 05/29/21 10:59 PM   Specimen: Nasopharyngeal Swab; Nasopharyngeal(NP) swabs in vial transport medium  Result Value Ref Range Status   SARS Coronavirus 2 by RT PCR NEGATIVE NEGATIVE Final    Comment: (NOTE) SARS-CoV-2 target nucleic acids are NOT DETECTED.  The SARS-CoV-2 RNA is generally detectable in upper respiratory specimens during the acute phase of infection. The lowest concentration of SARS-CoV-2 viral copies this assay can  detect is 138 copies/mL. A negative result does not preclude SARS-Cov-2 infection and should not be used as the sole basis for treatment or other patient management decisions. A negative result may occur with  improper specimen collection/handling, submission of specimen other than nasopharyngeal swab, presence of viral mutation(s) within the areas targeted by this assay, and inadequate number of viral copies(<138 copies/mL). A negative result must be combined with clinical observations, patient history, and epidemiological information. The expected result is Negative.  Fact Sheet for Patients:  04/11/2021  Fact Sheet for Healthcare Providers:   This test is no t yet approved or cleared by the 14/06/22 FDA and  has been authorized for detection and/or diagnosis of SARS-CoV-2 by FDA under an Emergency Use Authorization (EUA). This EUA will remain  in effect (meaning this test can be used) for the duration of the COVID-19 declaration under Section 564(b)(1) of the Act, 21 U.S.C.section  360bbb-3(b)(1), unless the authorization is terminated  or revoked sooner.       Influenza A by PCR NEGATIVE NEGATIVE Final   Influenza B by PCR NEGATIVE NEGATIVE Final    Comment: (NOTE) The Xpert Xpress SARS-CoV-2/FLU/RSV plus assay is intended as an aid in the diagnosis of influenza from Nasopharyngeal swab specimens and should not be used as a sole basis for treatment. Nasal washings and aspirates are unacceptable for Xpert Xpress SARS-CoV-2/FLU/RSV testing.  Fact Sheet for Patients: BloggerCourse.com  Fact Sheet for Healthcare Providers: SeriousBroker.it  This test is not yet approved or cleared by the [place] FDA and has been authorized for detection and/or diagnosis of SARS-CoV-2 by FDA under an Emergency Use Authorization (EUA). This EUA will remain in  effect (meaning this test can be used) for the duration of the COVID-19 declaration under Section 564(b)(1) of the Act, 21 U.S.C. section 360bbb-3(b)(1), unless the authorization is terminated or revoked.  Performed at [facility], [address] [place], [place] [address]   MRSA Next Gen by PCR, Nasal     Status: None   Collection Time: 06/01/21  2:37 PM   Specimen: Nasal Mucosa; Nasal Swab  Result Value Ref Range Status   MRSA by PCR Next Gen NOT DETECTED NOT DETECTED Final    Comment: (NOTE) The GeneXpert MRSA Assay (FDA approved for NASAL specimens only), is one component of a comprehensive MRSA colonization surveillance program. It is not intended to diagnose MRSA infection nor to guide or monitor treatment for MRSA infections. Test performance is not FDA approved in patients less than 67 years old. Performed at [facility], [address] [place], [place] [address]          Radiology Studies: No results found.      Scheduled Meds:  amLODipine  10 mg Per Tube Daily   Chlorhexidine Gluconate Cloth  6 each Topical Daily   docusate sodium  100 mg Oral Daily   enoxaparin (LOVENOX) injection  40 mg Subcutaneous Q24H   gabapentin  600 mg Oral BID   lisinopril  20 mg Oral Daily   polyethylene glycol  17 g Oral Daily   senna  1 tablet Oral Daily   Continuous Infusions:  sodium chloride 100 mL/hr at 06/04/21 0138   sodium chloride     famotidine (PEPCID) IV 20 mg (06/04/21 0908)   magnesium sulfate bolus IVPB       LOS: 6 days    Time spent: 45 min    Silvano Bilis, MD [facility]   If 7PM-7AM, please contact night-coverage [email or web address] Password [email or web address] 06/04/2021, 10:07 AM

## 2021-06-05 LAB — BASIC METABOLIC PANEL WITH GFR
Anion gap: 3 — ABNORMAL LOW (ref 5–15)
BUN: 17 mg/dL (ref 6–20)
CO2: 29 mmol/L (ref 22–32)
Calcium: 8.5 mg/dL — ABNORMAL LOW (ref 8.9–10.3)
Chloride: 107 mmol/L (ref 98–111)
Creatinine, Ser: 0.64 mg/dL (ref 0.61–1.24)
GFR, Estimated: >60 mL/min
Glucose, Bld: 124 mg/dL — ABNORMAL HIGH (ref 70–99)
Potassium: 3.5 mmol/L (ref 3.5–5.1)
Sodium: 139 mmol/L (ref 135–145)

## 2021-06-05 LAB — CBC
HCT: 24.3 % — ABNORMAL LOW (ref 39.0–52.0)
Hemoglobin: 8.4 g/dL — ABNORMAL LOW (ref 13.0–17.0)
MCH: 30.3 pg (ref 26.0–34.0)
MCHC: 34.6 g/dL (ref 30.0–36.0)
MCV: 87.7 fL (ref 80.0–100.0)
Platelets: 153 10*3/uL (ref 150–400)
RBC: 2.77 MIL/uL — ABNORMAL LOW (ref 4.22–5.81)
RDW: 15.2 % (ref 11.5–15.5)
WBC: 9.5 10*3/uL (ref 4.0–10.5)
nRBC: 0.2 % (ref 0.0–0.2)

## 2021-06-05 MED ORDER — ATORVASTATIN CALCIUM 80 MG PO TABS: 80.0000 mg | ORAL_TABLET | Freq: Every day | ORAL | 1 refills | Status: AC

## 2021-06-05 MED ORDER — NEURONTIN 300 MG PO CAPS
300.0000 mg | ORAL_CAPSULE | Freq: Two times a day (BID) | ORAL | Status: DC
Start: 2021-06-05 — End: 2021-07-04

## 2021-06-05 MED ORDER — ASPIRIN 81 MG PO TBEC: 81.0000 mg | DELAYED_RELEASE_TABLET | Freq: Every day | ORAL | 11 refills | Status: AC

## 2021-06-05 MED ORDER — AMLODIPINE BESYLATE 10 MG PO TABS: 5.0000 mg | ORAL_TABLET | Freq: Every morning | ORAL | 0 refills | Status: DC

## 2021-06-05 MED ORDER — CLOPIDOGREL BISULFATE 75 MG PO TABS: 75.0000 mg | ORAL_TABLET | Freq: Every day | ORAL | 1 refills | Status: AC

## 2021-06-05 NOTE — Discharge Summary (Signed)
Jeffrey Grant. MBW:[id number] DOB: 1980-12-08 DOA: 05/29/2021  PCP: [facility]  Admit date: 05/29/2021 Discharge date: 06/05/2021  Time spent: 40 minutes  Recommendations for Outpatient Follow-up:  Vascular surgery f/u 10-14 days     Discharge Diagnoses:  Principal Problem:   Atherosclerosis of artery of extremity with ulceration [facility])   Discharge Condition: stable  Diet recommendation: heart healthy  Filed Weights   05/29/21 0728 06/01/21 0656  Weight: 72.6 kg 73 kg    History of present illness:  From dr. Marijean Heath h and p The patient is seen for evaluation of painful lower extremities. Patient notes the pain is variable and not always associated with activity.  The pain is somewhat consistent day to day occurring on most days. The patient notes the pain also occurs with standing and routinely seems worse as the day wears on. The pain has been progressive over the past several years. The patient states these symptoms are causing  a profound negative impact on quality of life and daily activities.   The patient denies rest pain or dangling of an extremity off the side of the bed during the night for relief. No open wounds or sores at this time. No history of DVT or phlebitis. No prior interventions or surgeries.   There is a  history of back problems and DJD of the lumbar and sacral spine.     Venous duplex shows a patent deep venous system and mild reflux.   Arterial duplex shows monophasic signals at the bilateral femoral level suggesting severe aorta iliac disease.    Hospital Course:  # PAD Severe, symptomatic. S/p aorto-bifemoral bypass graft on 12/9 and right femoral endarterectomy w/ patch angioplasty on 12/10. Healing well. Leg pain resolved. Feet are warm. Tolerating diet - start aspirin/plavix and atorvastatin - pain control and bowel regimen - HH PT ordered - home w/ rolling walker and bedside control - taper and then discontinue  gabapentin   # HTN Hypertensive urgency in ICU treated with clonidine now weaned off. Bp wnl currently - cont amlodipine 10 (from home 5) - cont home lisinopril 20   # Rheumatoid arthritis Followed by [facility] as outpt. Not currently on meds - outpt f/u   # Tobacco abuse Declines patch. Motivated to quit  Procedures: See above   Consultations: Vascular surgery  Discharge Exam: Vitals:   06/05/21 0541 06/05/21 0816  BP: 126/72 124/80  Pulse: 99 (!) 106  Resp: 18 16  Temp: 98.3 F (36.8 C) 98.6 F (37 C)  SpO2: 100% 100%    General exam: Appears calm and comfortable  Respiratory system: Clear to auscultation. Respiratory effort normal. Cardiovascular system: S1 & S2 heard, RRR. No JVD, murmurs, rubs, gallops or clicks. No pedal edema. Gastrointestinal system: Abdomen is nondistended, soft and mildly tender. Dressed midline incision Central nervous system: Alert and oriented. No focal neurological deficits. Extremities: Symmetric 5 x 5 power. warm Skin: dressed ulcer on foot Psychiatry: Judgement and insight appear normal. Mood & affect appropriate.   Discharge Instructions   Discharge Instructions     DME Bedside commode   Complete by: As directed    Patient needs a bedside commode to treat with the following condition: PAD (peripheral artery disease) (HCC)   Diet - low sodium heart healthy   Complete by: As directed    Diet - low sodium heart healthy   Complete by: As directed    Increase activity slowly   Complete by: As directed  Increase activity slowly   Complete by: As directed    Leave dressing on - Keep it clean, dry, and intact until clinic visit   Complete by: As directed    No wound care   Complete by: As directed       Allergies as of 06/05/2021   No Known Allergies      Medication List     STOP taking these medications    Lyrica 100 MG capsule Generic drug: pregabalin   meloxicam 15 MG tablet Commonly known as: MOBIC        TAKE these medications    acetaminophen 500 MG tablet Commonly known as: TYLENOL Take 1,000 mg by mouth every 6 (six) hours as needed (pain.).   amLODipine 10 MG tablet Commonly known as: NORVASC Take 0.5 tablets (5 mg total) by mouth in the morning. What changed: medication strength   aspirin 81 MG EC tablet Take 1 tablet (81 mg total) by mouth daily. Swallow whole. Start taking on: June 06, 2021   atorvastatin 80 MG tablet Commonly known as: LIPITOR Take 1 tablet (80 mg total) by mouth daily. Start taking on: June 06, 2021   clopidogrel 75 MG tablet Commonly known as: PLAVIX Take 1 tablet (75 mg total) by mouth daily. Start taking on: June 06, 2021   cyclobenzaprine 10 MG tablet Commonly known as: FLEXERIL Take 10 mg by mouth every 8 (eight) hours as needed for muscle spasms.   HYDROcodone-acetaminophen 5-325 MG tablet Commonly known as: NORCO/VICODIN Take 1 tablet by mouth every 6 (six) hours as needed (pain.).   lisinopril 20 MG tablet Commonly known as: ZESTRIL Take 20 mg by mouth daily.   Neurontin 300 MG capsule Generic drug: gabapentin Take 1 capsule (300 mg total) by mouth 2 (two) times daily. What changed: how much to take   nicotine 21 mg/24hr patch Commonly known as: NICODERM CQ - dosed in mg/24 hours Place 21 mg onto the skin daily.   nicotine polacrilex 4 MG gum Commonly known as: NICORETTE Take 4 mg by mouth as needed for smoking cessation.   Vitamin D (Ergocalciferol) 1.25 MG (50000 UNIT) Caps capsule Commonly known as: DRISDOL Take 50,000 Units by mouth every Thursday.               Durable Medical Equipment  (From admission, onward)           Start     Ordered   06/04/21 1037  For home use only DME Walker rolling  Once       Question Answer Comment  Walker: With [place] Wheels   Patient needs a walker to treat with the following condition Weakness      06/04/21 1037   06/04/21 1034  DME Walker  Once        Question Answer Comment  Walker: With [place] Wheels   Patient needs a walker to treat with the following condition PAD (peripheral artery disease) ([place])      06/04/21 1034   06/04/21 0000  DME Bedside commode       Question:  Patient needs a bedside commode to treat with the following condition  Answer:  PAD (peripheral artery disease) ([place])   06/04/21 1034              Discharge Care Instructions  (From admission, onward)           Start     Ordered   06/05/21 0000  Leave dressing on - Keep  it clean, dry, and intact until clinic visit        06/05/21 1304           No Known Allergies  Follow-up Information     Kris Hartmann, NP Follow up in 2 week(s).   Specialty: Vascular Surgery Why: First post-op check. Staples removal. No studies. Contact information: [place] [address] [phone]                  The results of significant diagnostics from this hospitalization (including imaging, microbiology, ancillary and laboratory) are listed below for reference.    Significant Diagnostic Studies: DG Abd 1 View  Result Date: 06/02/2021 CLINICAL DATA:  Orogastric tube placement. EXAM: ABDOMEN - 1 VIEW COMPARISON:  None. FINDINGS: The bowel gas pattern is normal. Distal tip of nasogastric tube is seen in the stomach. No radio-opaque calculi or other significant radiographic abnormality are seen. IMPRESSION: Distal tip of nasogastric tube seen in the stomach. Electronically Signed   By: Marijo Conception M.D.   On: 06/02/2021 08:30   CT ANGIO AO+BIFEM W & OR WO CONTRAST  Result Date: 06/02/2021 CLINICAL DATA:  Claudication Loss of pulse in right leg Recent femoral bypass EXAM: CT ANGIOGRAPHY OF ABDOMINAL AORTA WITH ILIOFEMORAL RUNOFF TECHNIQUE: Multidetector CT imaging of the abdomen, pelvis and lower extremities was performed using the standard protocol during bolus administration of intravenous contrast. Multiplanar CT image reconstructions  and MIPs were obtained to evaluate the vascular anatomy. 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 Interval placement of aorto bi femoral bypass graft which is patent without significant stenosis. Celiac: Patent without evidence of aneurysm, dissection, vasculitis or significant stenosis. SMA: Patent without evidence of aneurysm, dissection, vasculitis or significant stenosis. Renals: Both renal arteries are patent without evidence of aneurysm, dissection, vasculitis, fibromuscular dysplasia or significant stenosis. IMA: Occluded proximally. Flow within the distal branches consistent with retrograde collateral flow. RIGHT Lower Extremity Inflow: Native right common iliac and external iliac arteries are occluded. Proximal internal iliac artery is occluded. Retrograde flow is present within distal branches. Outflow: Flow reconstitutes within the right common femoral artery through bypass. The Profunda femoris artery is patent. Complete to near complete occlusion of the proximal SFA over course of approximately 5 cm, best seen on the sagittal series. There is reconstitution of flow within the right SFA beyond the occluded segment. The distal right SFA is diminutive and diffusely diseased with calcified on noncalcified plaque. Right popliteal artery is diminutive but patent. Runoff: Atherosclerotic calcifications seen in the tibioperoneal trunk which remains patent. Peroneal and posterior tibial arteries are patent to the ankle. No significant opacification of the anterior tibial artery. LEFT Lower Extremity Inflow: Native left common and external iliac arteries are occluded. Proximal internal iliac artery is occluded. Opacification of the distal branches consistent with retrograde collateral flow. Outflow: Flow reconstitutes within the common femoral artery through bypass limb. Profunda femoris artery is patent.  The superficial femoral artery is the diffusely narrowed and diminutive but patent. Popliteal artery is diminutive but patent. Runoff: 2 peroneal trunk is patent. There is 2 vessel runoff to the left ankle through the peroneal and posterior tibial arteries. No significant opacification of the anterior tibial artery, similar to prior study. Veins: No obvious venous abnormality within the limitations of this arterial phase study. Review of the MIP images confirms the above findings. NON-VASCULAR Lower chest: Not included in the study. Hepatobiliary:  Visualized portions the liver are normal. No signal abnormality of the gallbladder. Pancreas: No significant abnormality. Spleen: Visualized portions are unremarkable. Adrenals/Urinary Tract: Adrenal glands are unremarkable. Bilateral simple renal cysts. Kidneys and ureters otherwise unremarkable. Evaluation of the bladder is limited due to streak artifact from right total hip prosthesis. Air within the bladder lumen likely iatrogenic given presence of Foley. Stomach/Bowel: NG tube terminates within the gastric lumen. Visualized portion of the stomach are otherwise unremarkable. No dilated loops of bowel to indicate ileus or obstruction. Appendix is normal. Lymphatic: No enlarged abdominal or pelvic lymph nodes. Reproductive: Evaluation limited due to streak artifact from right total hip prosthesis. The prostate appears at least mildly enlarged. Other: Anterior midline abdominal pelvic skin staples are noted. Subcutaneous emphysema consistent with recent surgical intervention. Mild subcutaneous edema of the ankles and feet. Ovoid cutaneous lesion of the left dorsal midfoot measuring 3.8 x 1.4 cm. This is similar in size to prior exam. Musculoskeletal: Right total hip prosthesis is present. No acute osseous abnormality. IMPRESSION: VASCULAR 1. Newly placed aortobifemoral bypass graft is patent. 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 2. Interval complete to near complete occlusion of the proximal right SFA involving the first 5 cm. There is reconstitution of flow within the right SFA distal to this segment. The mid and distal right SFA is diffusely narrowed similar to prior exam. NON-VASCULAR 1. No acute nonvascular abnormality of the abdomen or pelvis. 2. 3.8 x 1.4 cm cutaneous lesion again seen in the dorsal midfoot. This may be a blister. Please correlate with physical exam. Electronically Signed   By: Miachel Roux M.D.   On: 06/02/2021 10:41   CT ANGIO AO+BIFEM W & OR WO CONTRAST  Result Date: 05/29/2021 CLINICAL DATA:  Intermittent lower extremity pain. Evaluate for peripheral arterial disease. EXAM: CT ANGIOGRAPHY OF ABDOMINAL AORTA WITH ILIOFEMORAL RUNOFF TECHNIQUE: Multidetector CT imaging of the abdomen, pelvis and lower extremities was performed using the standard protocol during bolus administration of intravenous contrast. Multiplanar CT image reconstructions and MIPs were obtained to evaluate the vascular anatomy. CONTRAST:  176mL OMNIPAQUE IOHEXOL 350 MG/ML SOLN COMPARISON:  CT abdomen/pelvis 04/14/2018 FINDINGS: VASCULAR Aorta: Heterogeneous atherosclerotic plaque, predominantly fibrofatty visualized in the supra visceral abdominal aorta. Heavy fibrofatty atherosclerotic plaque versus mural thrombus in the infrarenal aorta resulting in complete occlusion. There is likely an element of thrombus extending from the inferior aspect of the occlusion into the left common iliac artery. Celiac: Patent without evidence of aneurysm, dissection, vasculitis or significant stenosis. SMA: Patent without evidence of aneurysm, dissection, vasculitis or significant stenosis. Renals: Both renal arteries are patent without evidence of aneurysm, dissection, vasculitis, fibromuscular dysplasia or significant stenosis. IMA: Occluded at the origin. The distal aspect reconstitutes via collateral flow from the SMA. RIGHT Lower  Extremity Inflow: Plaque extends into the common iliac artery resulting in approximately 50% narrowing in the proximal and mid segment. The internal iliac artery is occluded at the origin. The external iliac artery is also diffusely diseased with limited flow. Likely high-grade stenosis distally just proximal to the inguinal ligament. Outflow: Small caliber common femoral artery secondary to proximal occlusive disease. The profunda and superficial femoral arteries remain patent. Mild atherosclerotic plaque results in mild stenosis at Hunter's canal. The popliteal artery is mildly diseased but patent. Runoff: Multifocal runoff disease with proximal occlusion of the anterior tibial artery. Probable 2 vessel runoff to the ankle. LEFT Lower Extremity Inflow: Atherosclerotic plaque and thrombus result in high-grade stenosis of  the left common iliac artery. Chronic occlusion of the internal iliac artery. Focal occlusion of the proximal external iliac artery. Outflow: Small caliber common femoral artery secondary to proximal occlusive disease. The profunda and superficial femoral arteries are patent. Scattered plaque but difficult to assess for significant stenosis given the overall small caliber of the vessel. Suspect high-grade stenosis as the vessel exits the Hunter's canal. The popliteal artery is diseased but patent. Runoff: Multifocal runoff disease. Proximal occlusion of the anterior tibial artery. Patent 2 vessel runoff to the ankle. Veins: No focal venous abnormality. Review of the MIP images confirms the above findings. NON-VASCULAR Lower chest: Dependent atelectasis.  No acute abnormality. Hepatobiliary: No focal liver abnormality is seen. No gallstones, gallbladder wall thickening, or biliary dilatation. Pancreas: Unremarkable. No pancreatic ductal dilatation or surrounding inflammatory changes. Spleen: Normal in size without focal abnormality. Adrenals/Urinary Tract: Normal adrenal glands. No hydronephrosis or  nephrolithiasis. Circumscribed 1.8 cm simple cyst in the upper pole of the left kidney. No enhancing renal mass. Stomach/Bowel: No evidence of obstruction or focal bowel wall thickening. Normal appendix in the right lower quadrant. The terminal ileum is unremarkable. Lymphatic: No suspicious lymphadenopathy. Reproductive: Prostate is unremarkable. Other: No abdominal wall hernia or abnormality. No abdominopelvic ascites. Musculoskeletal: No acute or significant osseous findings. Surgical changes of prior right hip arthroplasty. IMPRESSION: VASCULAR 1. 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 2. Focal high-grade stenosis of the left superficial femoral artery in the region of Hunter's canal. 3. Multifocal runoff disease bilaterally with chronic occlusion of the anterior tibial arteries. 4. No evidence of aneurysm or dissection. NON-VASCULAR 1. No acute abnormality within the abdomen or pelvis. 2. Ancillary findings as above. Aortic Atherosclerosis (ICD10-I70.0). Electronically Signed   By: Malachy Moan M.D.   On: 05/29/2021 10:38   PERIPHERAL VASCULAR CATHETERIZATION  Result Date: 05/29/2021 See surgical note for result.  NM Myocar Multi W/Spect W/Wall Motion / EF  Result Date: 05/31/2021   No ST deviation was noted.   LV perfusion is normal.   Left ventricular function is normal.   Prior study not available for comparison.   ECHOCARDIOGRAM COMPLETE  Result Date: 05/30/2021    ECHOCARDIOGRAM REPORT   Patient Name:   Lim Janet. Date of Exam: 05/30/2021 Medical Rec #:  [id number]         Height:       68.0 in Accession #:    [id number]        Weight:       160.0 lb Date of Birth:  08/27/1980         BSA:          1.859 m Patient Age:    40 years          BP:           130/89 mmHg Patient Gender: M                 HR:           98 bpm. Exam Location:  [facility] Procedure: 2D Echo,  Cardiac Doppler and Color Doppler Indications:     Abnormal ECG R94.31  History:         Patient has no prior history of Echocardiogram examinations.                  Signs/Symptoms:Dyspnea. Anxiety, PVD.  Sonographer:     Cristela Blue Referring Phys:  [phone]  Dolores Lory SCHNIER Diagnosing Phys: Serafina Royals MD  Sonographer Comments: Suboptimal apical window. IMPRESSIONS  1. Left ventricular ejection fraction, by estimation, is 60 to 65%. The left ventricle has normal function. The left ventricle has no regional wall motion abnormalities. Left ventricular diastolic parameters were normal.  2. Right ventricular systolic function is normal. The right ventricular size is normal.  3. The mitral valve is normal in structure. Trivial mitral valve regurgitation.  4. The aortic valve is normal in structure. Aortic valve regurgitation is not visualized. FINDINGS  Left Ventricle: Left ventricular ejection fraction, by estimation, is 60 to 65%. The left ventricle has normal function. The left ventricle has no regional wall motion abnormalities. The left ventricular internal cavity size was normal in size. There is  no left ventricular hypertrophy. Left ventricular diastolic parameters were normal. Right Ventricle: The right ventricular size is normal. No increase in right ventricular wall thickness. Right ventricular systolic function is normal. Left Atrium: Left atrial size was normal in size. Right Atrium: Right atrial size was normal in size. Pericardium: There is no evidence of pericardial effusion. Mitral Valve: The mitral valve is normal in structure. Trivial mitral valve regurgitation. MV peak gradient, 3.3 mmHg. The mean mitral valve gradient is 2.0 mmHg. Tricuspid Valve: The tricuspid valve is normal in structure. Tricuspid valve regurgitation is trivial. Aortic Valve: The aortic valve is normal in structure. Aortic valve regurgitation is not visualized. Aortic valve mean gradient measures 2.0 mmHg. Aortic valve peak  gradient measures 3.5 mmHg. Aortic valve area, by VTI measures 3.74 cm. Pulmonic Valve: The pulmonic valve was normal in structure. Pulmonic valve regurgitation is not visualized. Aorta: The aortic root and ascending aorta are structurally normal, with no evidence of dilitation. IAS/Shunts: No atrial level shunt detected by color flow Doppler.  LEFT VENTRICLE PLAX 2D LVIDd:         3.50 cm   Diastology LVIDs:         2.30 cm   LV e' medial:    7.07 cm/s LV PW:         1.20 cm   LV E/e' medial:  7.5 LV IVS:        0.67 cm   LV e' lateral:   9.25 cm/s LVOT diam:     2.00 cm   LV E/e' lateral: 5.7 LV SV:         43 LV SV Index:   23 LVOT Area:     3.14 cm  RIGHT VENTRICLE RV Basal diam:  3.20 cm RV S prime:     14.00 cm/s TAPSE (M-mode): 3.9 cm LEFT ATRIUM           Index        RIGHT ATRIUM           Index LA diam:      2.50 cm 1.34 cm/m   RA Area:     12.30 cm LA Vol (A2C): 24.3 ml 13.07 ml/m  RA Volume:   29.80 ml  16.03 ml/m LA Vol (A4C): 17.3 ml 9.31 ml/m  AORTIC VALVE                    PULMONIC VALVE AV Area (Vmax):    2.90 cm     PV Vmax:        0.96 m/s AV Area (Vmean):   2.92 cm     PV Vmean:       67.300 cm/s AV Area (VTI):  3.74 cm     PV VTI:         0.149 m AV Vmax:           93.30 cm/s   PV Peak grad:   3.7 mmHg AV Vmean:          64.800 cm/s  PV Mean grad:   2.0 mmHg AV VTI:            0.116 m      RVOT Peak grad: 2 mmHg AV Peak Grad:      3.5 mmHg AV Mean Grad:      2.0 mmHg LVOT Vmax:         86.20 cm/s LVOT Vmean:        60.200 cm/s LVOT VTI:          0.138 m LVOT/AV VTI ratio: 1.19  AORTA Ao Root diam: 3.00 cm MITRAL VALVE MV Area (PHT): 6.07 cm    SHUNTS MV Area VTI:   3.52 cm    Systemic VTI:  0.14 m MV Peak grad:  3.3 mmHg    Systemic Diam: 2.00 cm MV Mean grad:  2.0 mmHg    Pulmonic VTI:  0.118 m MV Vmax:       0.90 m/s MV Vmean:      64.0 cm/s MV Decel Time: 125 msec MV E velocity: 52.70 cm/s MV A velocity: 58.30 cm/s MV E/A ratio:  0.90 Serafina Royals MD Electronically signed by  Serafina Royals MD Signature Date/Time: 05/30/2021/12:08:37 PM    Final    VAS [place] LOWER EXTREMITY ARTERIAL DUPLEX  Result Date: 05/10/2021 LOWER EXTREMITY ARTERIAL DUPLEX STUDY Patient Name:  Oland Malanga.  Date of Exam:   05/10/2021 Medical Rec #: [id number]          Accession #:    [id number] Date of Birth: April 15, 1981          Patient Gender: M Patient Age:   67 years Exam Location:  [facility] Procedure:      VAS [place] LOWER EXTREMITY ARTERIAL DUPLEX Referring Phys: Serafina Royals --------------------------------------------------------------------------------  Indications: Rest pain.  Current ABI: Not Obtained Performing Technologist: Almira Coaster RVS  Examination Guidelines: A complete evaluation includes B-mode imaging, spectral Doppler, color Doppler, and power Doppler as needed of all accessible portions of each vessel. Bilateral testing is considered an integral part of a complete examination. Limited examinations for reoccurring indications may be performed as noted.   +-----------+--------+-----+--------+-------------+--------+  LEFT        PSV cm/s Ratio Stenosis Waveform      Comments  +-----------+--------+-----+--------+-------------+--------+  CFA Distal  49                      monophasic              +-----------+--------+-----+--------+-------------+--------+  DFA         35                      monophasic              +-----------+--------+-----+--------+-------------+--------+  SFA Prox    54                      monophasic              +-----------+--------+-----+--------+-------------+--------+  SFA Mid     39  monophasic              +-----------+--------+-----+--------+-------------+--------+  SFA Distal  81                      monophasic              +-----------+--------+-----+--------+-------------+--------+  POP Distal  33                      monophasic              +-----------+--------+-----+--------+-------------+--------+  ATA Distal  0                        Absent                  +-----------+--------+-----+--------+-------------+--------+  PTA Distal  11                      Non Pulsatile           +-----------+--------+-----+--------+-------------+--------+  PERO Distal 9                       Non pulsatile           +-----------+--------+-----+--------+-------------+--------+  Summary: Left: Imaging and Waveforms obtained throughout in the Left Lower Extremity. Monophasic Waveforms obtained throughout in Arteries in the Left Lower Extremity. No flow seen in the Left ATA, Non pulsatile flow seen in the ATA/Peroneal Artery.  See table(s) above for measurements and observations. Electronically signed by [name] MD on 05/10/2021 at 4:38:40 PM.    Final    VAS [place] LOWER EXTREMITY VENOUS REFLUX  Result Date: 05/10/2021  Lower Venous Reflux Study Patient Name:  Reuben [place].  Date of Exam:   05/10/2021 Medical Rec #: [id number]          Accession #:    [id number] Date of Birth: 29-Jan-1981          Patient Gender: M Patient Age:   22 years Exam Location:  [facility] Procedure:      VAS [place] LOWER EXTREMITY VENOUS REFLUX Referring Phys: Serafina Royals --------------------------------------------------------------------------------  Indications: Pain, and Swelling.  Performing Technologist: Almira Coaster RVS  Examination Guidelines: A complete evaluation includes B-mode imaging, spectral Doppler, color Doppler, and power Doppler as needed of all accessible portions of each vessel. Bilateral testing is considered an integral part of a complete examination. Limited examinations for reoccurring indications may be performed as noted. The reflux portion of the exam is performed with the patient in reverse Trendelenburg. Significant venous reflux is defined as >500 ms in the superficial venous system, and >1 second in the deep venous system.  Venous Reflux Times +--------------+---------+------+-----------+------------+--------+  RIGHT           Reflux No Reflux Reflux Time Diameter cms Comments                             Yes                                      +--------------+---------+------+-----------+------------+--------+  CFV            no                                                  +--------------+---------+------+-----------+------------+--------+  FV prox        no                                                  +--------------+---------+------+-----------+------------+--------+  FV mid         no                                                  +--------------+---------+------+-----------+------------+--------+  FV dist        no                                                  +--------------+---------+------+-----------+------------+--------+  Popliteal      no                                                  +--------------+---------+------+-----------+------------+--------+  GSV at SFJ     no                               .47                +--------------+---------+------+-----------+------------+--------+  GSV prox thigh            yes     3073 ms       .40                +--------------+---------+------+-----------+------------+--------+  GSV mid thigh  no                               .40                +--------------+---------+------+-----------+------------+--------+  GSV dist thigh no                               .35                +--------------+---------+------+-----------+------------+--------+  GSV at knee    no                               .36                +--------------+---------+------+-----------+------------+--------+  GSV prox calf  no                               .33                +--------------+---------+------+-----------+------------+--------+  SSV Pop Fossa  no                               .29                +--------------+---------+------+-----------+------------+--------+  +--------------+---------+------+-----------+------------+--------+  LEFT           Reflux No Reflux Reflux Time Diameter  cms Comments                             Yes                                      +--------------+---------+------+-----------+------------+--------+  CFV            no                                                  +--------------+---------+------+-----------+------------+--------+  FV prox        no                                                  +--------------+---------+------+-----------+------------+--------+  FV mid         no                                                  +--------------+---------+------+-----------+------------+--------+  FV dist        no                                                  +--------------+---------+------+-----------+------------+--------+  Popliteal      no                                                  +--------------+---------+------+-----------+------------+--------+  GSV at SFJ     no                               .36                +--------------+---------+------+-----------+------------+--------+  GSV prox thigh no                               .54                +--------------+---------+------+-----------+------------+--------+  GSV mid thigh  no                               .36                +--------------+---------+------+-----------+------------+--------+  GSV dist thigh no                               .45                +--------------+---------+------+-----------+------------+--------+  GSV at knee    no                               .43                +--------------+---------+------+-----------+------------+--------+  GSV prox calf  no                               .38                +--------------+---------+------+-----------+------------+--------+   Summary: Bilateral: - No evidence of deep vein thrombosis seen in the lower extremities, bilaterally, from the common femoral through the popliteal veins. - No evidence of superficial venous thrombosis in the lower extremities, bilaterally. - No evidence of deep venous insufficiency seen bilaterally  in the lower extremity. - No evidence of superficial venous reflux seen in the short saphenous veins bilaterally.  Right: - Venous reflux is noted in the right greater saphenous vein in the thigh.  - Incidental finding: The Right CFA displays Monophasic Flow.  Left: - No evidence of superficial venous reflux seen in the left greater saphenous vein.  *See table(s) above for measurements and observations. Electronically signed by [name] MD on 05/10/2021 at 4:38:28 PM.    Final     Microbiology: Recent Results (from the past 240 hour(s))  Resp Panel by RT-PCR (Flu A&B, Covid) Nasopharyngeal Swab     Status: None   Collection Time: 05/29/21 10:59 PM   Specimen: Nasopharyngeal Swab; Nasopharyngeal(NP) swabs in vial transport medium  Result Value Ref Range Status   SARS Coronavirus 2 by RT PCR NEGATIVE NEGATIVE Final    Comment: (NOTE) SARS-CoV-2 target nucleic acids are NOT DETECTED.  The SARS-CoV-2 RNA is generally detectable in upper respiratory specimens during the acute phase of infection. The lowest concentration of SARS-CoV-2 viral copies this assay can detect is 138 copies/mL. A negative result does not preclude SARS-Cov-2 infection and should not be used as the sole basis for treatment or other patient management decisions. A negative result may occur with  improper specimen collection/handling, submission of specimen other than nasopharyngeal swab, presence of viral mutation(s) within the areas targeted by this assay, and inadequate number of viral copies(<138 copies/mL). A negative result must be combined with clinical observations, patient history, and epidemiological information. The expected result is Negative.  Fact Sheet for Patients:  EntrepreneurPulse.com.au  Fact Sheet for Healthcare Providers:  IncredibleEmployment.be  This test is no t yet approved or cleared by the [place] FDA and  has been authorized for detection  and/or diagnosis of SARS-CoV-2 by FDA under an Emergency Use Authorization (EUA). This EUA will remain  in effect (meaning this test can be used) for the duration of the COVID-19 declaration under Section 564(b)(1) of the Act, 21 U.S.C.section 360bbb-3(b)(1), unless the authorization is terminated  or revoked sooner.       Influenza A by PCR NEGATIVE NEGATIVE Final   Influenza B by PCR NEGATIVE NEGATIVE Final    Comment: (NOTE) The Xpert Xpress SARS-CoV-2/FLU/RSV plus assay is intended as an aid in the diagnosis of influenza from Nasopharyngeal swab specimens and should not be used as a sole basis for treatment. Nasal washings and aspirates are unacceptable for Xpert Xpress SARS-CoV-2/FLU/RSV testing.  Fact Sheet for Patients: EntrepreneurPulse.com.au  Fact Sheet for Healthcare Providers: IncredibleEmployment.be  This test is not yet approved or cleared  by the [place] and has been authorized for detection and/or diagnosis of SARS-CoV-2 by FDA under an Emergency Use Authorization (EUA). This EUA will remain in effect (meaning this test can be used) for the duration of the COVID-19 declaration under Section 564(b)(1) of the Act, 21 U.S.C. section 360bbb-3(b)(1), unless the authorization is terminated or revoked.  Performed at [facility], [address] [place], [place] [address]   MRSA Next Gen by PCR, Nasal     Status: None   Collection Time: 06/01/21  2:37 PM   Specimen: Nasal Mucosa; Nasal Swab  Result Value Ref Range Status   MRSA by PCR Next Gen NOT DETECTED NOT DETECTED Final    Comment: (NOTE) The GeneXpert MRSA Assay (FDA approved for NASAL specimens only), is one component of a comprehensive MRSA colonization surveillance program. It is not intended to diagnose MRSA infection nor to guide or monitor treatment for MRSA infections. Test performance is not FDA approved in patients less than 7  years old. Performed at [facility], [address] [place], [place] [address]      Labs: Basic Metabolic Panel: Recent Labs  Lab 06/01/21 0204 06/01/21 1517 06/01/21 1702 06/02/21 0513 06/03/21 0338 06/04/21 0642 06/05/21 0213  NA 137  --  132* 134* 135 137 139  K 4.5  --  4.7 4.1 4.0 3.6 3.5  CL 100  --  100 101 101 106 107  CO2 28  --  24 26 27 23 29   GLUCOSE 134*  --  162* 148* 122* 105* 124*  BUN 20  --  16 15 15 18 17   CREATININE 0.76   < > 0.71 0.75 0.76 0.75 0.64  CALCIUM 9.6  --  8.6* 8.5* 8.4* 8.3* 8.5*  MG 2.5*  --   --  2.0 2.1  --   --   PHOS  --   --   --  3.8 2.9  --   --    < > = values in this interval not displayed.   Liver Function Tests: No results for input(s): AST, ALT, ALKPHOS, BILITOT, PROT, ALBUMIN in the last 168 hours. No results for input(s): LIPASE, AMYLASE in the last 168 hours. No results for input(s): AMMONIA in the last 168 hours. CBC: Recent Labs  Lab 06/01/21 1517 06/02/21 0513 06/03/21 0338 06/04/21 0642 06/05/21 0213  WBC 12.1* 14.5* 14.2* 10.9* 9.5  HGB 11.0* 11.0* 9.0* 8.4* 8.4*  HCT 32.7* 32.3* 26.2* 24.7* 24.3*  MCV 87.7 85.4 87.0 87.3 87.7  PLT 194 195 141* 139* 153   Cardiac Enzymes: No results for input(s): CKTOTAL, CKMB, CKMBINDEX, TROPONINI in the last 168 hours. BNP: BNP (last 3 results) No results for input(s): BNP in the last 8760 hours.  ProBNP (last 3 results) No results for input(s): PROBNP in the last 8760 hours.  CBG: Recent Labs  Lab 06/01/21 1313 06/01/21 1433  GLUCAP 171* 171*       Signed:  Desma Maxim MD.  [facility] 06/05/2021, 1:12 PM

## 2021-06-05 NOTE — TOC Transition Note (Signed)
Transition of Care [facility]) - CM/SW Discharge Note   Patient Details  Name: Jeffrey Grant. MRN: [id number] Date of Birth: [birth year]  Transition of Care [facility]) CM/SW Contact:  Chapman Fitch, RN Phone Number: 06/05/2021, 1:43 PM   Clinical Narrative:     Patient to discharge today Per Barbara Cower with [facility] they are able to accept the referral with start of care date for next Tuesday.  Patient and MD updated  BSC and RW delivered to room by [employer]   Final next level of care: Home w Home Health Services Barriers to Discharge: Barriers Resolved   Patient Goals and CMS Choice     Choice offered to / list presented to : Patient  Discharge Placement                       Discharge Plan and Services   Discharge Planning Services: CM Consult Post Acute Care Choice: Home Health          DME Arranged: Walker rolling, 3-N-1         HH Arranged: PT HH Agency: [facility] Date HH Agency Contacted: 06/05/21   Representative spoke with at [facility] Agency: Barbara Cower  Social Determinants of Health (SDOH) Interventions     Readmission Risk Interventions No flowsheet data found.

## 2021-06-05 NOTE — Discharge Instructions (Signed)
You may remove your dressing and shower.  Please do not lift greater than 10 pounds until you are cleared at your first post-op follow up. Do not drive for two weeks.

## 2021-06-05 NOTE — Plan of Care (Signed)
Patient discharged to home with home health.  DME- BSC and walker at bedside for home.  Discharge instructions reviewed and given to patient who verbalized understanding.

## 2021-06-20 ENCOUNTER — Encounter (INDEPENDENT_AMBULATORY_CARE_PROVIDER_SITE_OTHER): Payer: Self-pay | Admitting: Nurse Practitioner

## 2021-06-20 ENCOUNTER — Ambulatory Visit (INDEPENDENT_AMBULATORY_CARE_PROVIDER_SITE_OTHER): Payer: Medicaid Other | Admitting: Nurse Practitioner

## 2021-06-20 VITALS — BP 110/74 | HR 137 | Ht 68.0 in | Wt 145.0 lb

## 2021-06-20 DIAGNOSIS — I70299 Other atherosclerosis of native arteries of extremities, unspecified extremity: Secondary | ICD-10-CM

## 2021-06-20 DIAGNOSIS — L97909 Non-pressure chronic ulcer of unspecified part of unspecified lower leg with unspecified severity: Secondary | ICD-10-CM

## 2021-06-26 ENCOUNTER — Encounter (INDEPENDENT_AMBULATORY_CARE_PROVIDER_SITE_OTHER): Payer: Self-pay | Admitting: Nurse Practitioner

## 2021-06-26 NOTE — Progress Notes (Addendum)
Subjective:    Patient ID: Jeffrey Grant., male    DOB: 1981-03-14, 41 y.o.   MRN: [id number] Chief Complaint  Patient presents with   Follow-up    Follow up with [name] Vascular surgery in 2wks  12/28/ first post op check  staple removal     Jeffrey Grant is a 41 year old male that presents today after multiple interventions including:  The first on 06/01/2021 including:  PROCEDURE:    1.  Aortobifemoral bypass with 12 mm diameter proximal 6 mm diameter distal bifurcated Dacron graft 2.   Left common femoral and superficial femoral artery endarterectomies 3.   Right common femoral and superficial femoral artery endarterectomies    He was subsequently returned to the operating room on 06/02/2021 for intervention including:  Right superficial femoral endarterectomy with CorMatrix patch angioplasty  Today the patient is doing well.  He notes that his legs and feet feel warm and his previous wounds on his feet are now essentially healed.  His biggest complaint is continued numbness of his lower extremities.  The wounds are intact.  Overall he is progressing well.     Review of Systems  Skin:  Positive for wound.  Neurological:  Positive for numbness.  All other systems reviewed and are negative.     Objective:   Physical Exam Vitals reviewed.  HENT:     Head: Normocephalic.  Cardiovascular:     Rate and Rhythm: Normal rate.     Pulses: Normal pulses.  Pulmonary:     Effort: Pulmonary effort is normal.  Skin:    General: Skin is warm and dry.  Neurological:     Mental Status: He is alert and oriented to person, place, and time.  Psychiatric:        Mood and Affect: Mood normal.        Behavior: Behavior normal.        Thought Content: Thought content normal.        Judgment: Judgment normal.    BP 110/74    Pulse (!) 137    Ht 5\' 8"  (1.727 m)    Wt 145 lb (65.8 kg)    BMI 22.05 kg/m   Past Medical History:  Diagnosis Date   Anxiety     Atherosclerotic PVD with intermittent claudication (HCC)    Back pain    Collagen vascular disease (HCC)    Dyspnea    Dysrhythmia    Elevated lipids    Hypertension    Nausea    Seropositive rheumatoid arthritis ([place])     Social History   Socioeconomic History   Marital status: Married    Spouse name: Not on file   Number of children: Not on file   Years of education: Not on file   Highest education level: Not on file  Occupational History   Not on file  Tobacco Use   Smoking status: Every Day    Packs/day: 0.25    Years: 16.00    Pack years: 4.00    Types: Cigarettes   Smokeless tobacco: Never  Vaping Use   Vaping Use: Never used  Substance and Sexual Activity   Alcohol use: No   Drug use: Not Currently   Sexual activity: Yes    Partners: Female  Other Topics Concern   Not on file  Social History Narrative   Not on file   Social Determinants of Health   Financial Resource Strain: Not on file  Food Insecurity: Not on  file  Transportation Needs: Not on file  Physical Activity: Not on file  Stress: Not on file  Social Connections: Not on file  Intimate Partner Violence: Not on file    Past Surgical History:  Procedure Laterality Date   AORTA - BILATERAL FEMORAL ARTERY BYPASS GRAFT Bilateral 06/01/2021   Procedure: AORTA BIFEMORAL BYPASS GRAFT;  Surgeon: Katha Cabal, MD;  Location: [facility] ORS;  Service: Vascular;  Laterality: Bilateral;   COLONOSCOPY WITH PROPOFOL N/A 04/21/2018   Procedure: COLONOSCOPY WITH PROPOFOL;  Surgeon: Jonathon Bellows, MD;  Location: [facility] ENDOSCOPY;  Service: Gastroenterology;  Laterality: N/A;   ENDARTERECTOMY POPLITEAL Right 06/02/2021   Procedure: Right Femoral Endarterectomy and Patching Endoplasty;  Surgeon: Serafina Mitchell, MD;  Location: [facility] ORS;  Service: Vascular;  Laterality: Right;   ESOPHAGOGASTRODUODENOSCOPY (EGD) WITH PROPOFOL N/A 04/21/2018   Procedure: ESOPHAGOGASTRODUODENOSCOPY (EGD) WITH PROPOFOL;  Surgeon: Jonathon Bellows, MD;  Location: [facility] ENDOSCOPY;  Service: Gastroenterology;  Laterality: N/A;   HIP PINNING Right    LOWER EXTREMITY ANGIOGRAPHY N/A 05/29/2021   Procedure: LOWER EXTREMITY ANGIOGRAPHY;  Surgeon: Katha Cabal, MD;  Location: [place] [facility];  Service: Cardiovascular;  Laterality: N/A;   TOTAL HIP ARTHROPLASTY Right 10/22/2016   Procedure: TOTAL HIP ARTHROPLASTY ANTERIOR APPROACH;  Surgeon: Hessie Knows, MD;  Location: [facility] ORS;  Service: Orthopedics;  Laterality: Right;    History reviewed. No pertinent family history.  No Known Allergies  CBC Latest Ref Rng & Units 06/05/2021 06/04/2021 06/03/2021  WBC 4.0 - 10.5 K/uL 9.5 10.9(H) 14.2(H)  Hemoglobin 13.0 - 17.0 g/dL 8.4(L) 8.4(L) 9.0(L)  Hematocrit 39.0 - 52.0 % 24.3(L) 24.7(L) 26.2(L)  Platelets 150 - 400 K/uL 153 139(L) 141(L)      CMP     Component Value Date/Time   NA 139 06/05/2021 0213   NA 140 03/30/2018 1404   K 3.5 06/05/2021 0213   CL 107 06/05/2021 0213   CO2 29 06/05/2021 0213   GLUCOSE 124 (H) 06/05/2021 0213   BUN 17 06/05/2021 0213   BUN 9 03/30/2018 1404   CREATININE 0.64 06/05/2021 0213   CALCIUM 8.5 (L) 06/05/2021 0213   PROT 7.3 03/30/2018 1404   ALBUMIN 4.6 03/30/2018 1404   AST 41 (H) 03/30/2018 1404   ALT 37 03/30/2018 1404   ALKPHOS 79 03/30/2018 1404   BILITOT 0.3 03/30/2018 1404   GFRNONAA >60 06/05/2021 0213   GFRAA 129 03/30/2018 1404     No results found.     Assessment & Plan:   1. 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  Previous wounds on feet are now healed.  Patient has warm feet with palpable pulses.  There is some decreased sensation but this may be an ongoing improvement as the patient has lower extremity perfusion.  We will have the patient return in about 2 weeks for noninvasive studies as well as wound check.   Current Outpatient Medications on File Prior to Visit  Medication Sig Dispense  Refill   acetaminophen (TYLENOL) 500 MG tablet Take 1,000 mg by mouth every 6 (six) hours as needed (pain.).     amLODipine (NORVASC) 10 MG tablet Take 0.5 tablets (5 mg total) by mouth in the morning. 90 tablet 0   aspirin EC 81 MG EC tablet Take 1 tablet (81 mg total) by mouth daily. Swallow whole. 30 tablet 11   atorvastatin (LIPITOR) 80 MG tablet Take 1 tablet (80 mg total) by mouth daily. 90 tablet  1   clopidogrel (PLAVIX) 75 MG tablet Take 1 tablet (75 mg total) by mouth daily. 90 tablet 1   cyclobenzaprine (FLEXERIL) 10 MG tablet Take 10 mg by mouth every 8 (eight) hours as needed for muscle spasms.     HYDROcodone-acetaminophen (NORCO/VICODIN) 5-325 MG tablet Take 1 tablet by mouth every 6 (six) hours as needed (pain.).     lisinopril (ZESTRIL) 20 MG tablet Take 20 mg by mouth daily.     NEURONTIN 300 MG capsule Take 1 capsule (300 mg total) by mouth 2 (two) times daily.     nicotine (NICODERM CQ - DOSED IN MG/24 HOURS) 21 mg/24hr patch Place 21 mg onto the skin daily.     nicotine polacrilex (NICORETTE) 4 MG gum Take 4 mg by mouth as needed for smoking cessation.     Vitamin D, Ergocalciferol, (DRISDOL) 50000 units CAPS capsule Take 50,000 Units by mouth every Thursday.     ondansetron (ZOFRAN) 8 MG tablet Take 8 mg by mouth every 8 (eight) hours as needed.     No current facility-administered medications on file prior to visit.    There are no Patient Instructions on file for this visit. No follow-ups on file.   Kris Hartmann, NP

## 2021-06-28 ENCOUNTER — Encounter (INDEPENDENT_AMBULATORY_CARE_PROVIDER_SITE_OTHER): Payer: Self-pay | Admitting: Vascular Surgery

## 2021-06-28 ENCOUNTER — Other Ambulatory Visit (INDEPENDENT_AMBULATORY_CARE_PROVIDER_SITE_OTHER): Payer: Self-pay | Admitting: Nurse Practitioner

## 2021-06-28 MED ORDER — HYDROCODONE-ACETAMINOPHEN 5-325 MG PO TABS: 1.0000 | ORAL_TABLET | Freq: Four times a day (QID) | ORAL | 0 refills | Status: DC | PRN

## 2021-06-28 NOTE — Telephone Encounter (Signed)
Can we verify his pharmacy?

## 2021-06-30 ENCOUNTER — Encounter (INDEPENDENT_AMBULATORY_CARE_PROVIDER_SITE_OTHER): Payer: Self-pay

## 2021-07-03 ENCOUNTER — Other Ambulatory Visit (INDEPENDENT_AMBULATORY_CARE_PROVIDER_SITE_OTHER): Payer: Self-pay | Admitting: Nurse Practitioner

## 2021-07-03 DIAGNOSIS — Z9889 Other specified postprocedural states: Secondary | ICD-10-CM

## 2021-07-03 DIAGNOSIS — I70223 Atherosclerosis of native arteries of extremities with rest pain, bilateral legs: Secondary | ICD-10-CM

## 2021-07-04 ENCOUNTER — Encounter (INDEPENDENT_AMBULATORY_CARE_PROVIDER_SITE_OTHER): Payer: Self-pay | Admitting: Nurse Practitioner

## 2021-07-04 ENCOUNTER — Ambulatory Visit (INDEPENDENT_AMBULATORY_CARE_PROVIDER_SITE_OTHER): Payer: Medicaid Other

## 2021-07-04 ENCOUNTER — Other Ambulatory Visit: Payer: Self-pay

## 2021-07-04 ENCOUNTER — Ambulatory Visit (INDEPENDENT_AMBULATORY_CARE_PROVIDER_SITE_OTHER): Payer: Medicaid Other | Admitting: Nurse Practitioner

## 2021-07-04 VITALS — BP 109/72 | Ht 68.0 in | Wt 149.8 lb

## 2021-07-04 DIAGNOSIS — I70299 Other atherosclerosis of native arteries of extremities, unspecified extremity: Secondary | ICD-10-CM

## 2021-07-04 DIAGNOSIS — E782 Mixed hyperlipidemia: Secondary | ICD-10-CM

## 2021-07-04 DIAGNOSIS — I70223 Atherosclerosis of native arteries of extremities with rest pain, bilateral legs: Secondary | ICD-10-CM

## 2021-07-04 DIAGNOSIS — Z9889 Other specified postprocedural states: Secondary | ICD-10-CM

## 2021-07-04 DIAGNOSIS — F17200 Nicotine dependence, unspecified, uncomplicated: Secondary | ICD-10-CM

## 2021-07-04 DIAGNOSIS — L97909 Non-pressure chronic ulcer of unspecified part of unspecified lower leg with unspecified severity: Secondary | ICD-10-CM

## 2021-07-04 DIAGNOSIS — I10 Essential (primary) hypertension: Secondary | ICD-10-CM

## 2021-07-04 MED ORDER — HYDROCODONE-ACETAMINOPHEN 5-325 MG PO TABS: 1.0000 | ORAL_TABLET | Freq: Four times a day (QID) | ORAL | 0 refills | Status: DC | PRN

## 2021-07-04 MED ORDER — NEURONTIN 300 MG PO CAPS: 300.0000 mg | ORAL_CAPSULE | Freq: Three times a day (TID) | ORAL | Status: DC

## 2021-07-04 NOTE — H&P (View-Only) (Signed)
Subjective:    Patient ID: Jeffrey Grant., male    DOB: 17-May-1981, 41 y.o.   MRN: [id number] Chief Complaint  Patient presents with   Follow-up    Jeffrey Grant is a 41 year old male that presents today for follow-up following multiple interventions including the first on 06/01/2021 which entailed:  PROCEDURE: 1. Aortobifemoral bypass grafting with 12 x 6 bifurcated dacryon graft 2.   Right common femoral endarterectomy 2. Right superficial femoral endarterectomy 3. Left common femoral endarterectomy 4. Left superficial femoral endarterectomy   He had a subsequent intervention on 06/02/2021 including, for ischemia following his intervention:  Procedure:   Right superficial femoral endarterectomy with CorMatrix patch angioplasty  Initially after surgery the patient noted no pain and being able to walk well however recently he began to have severe pain in his feet.  Initially it was to be related to nerve pain and issues however he notes that the pain is worse when he is sleeping and it gets better when he gets up in the morning.  However, recently it has been consistent no matter what position his legs are in.  The patient had wounds on his feet that have healed however there is an ulceration on his left lower extremity that has continued to be slow to heal.  Currently the patient notes that he has no sensation in his right lower extremity but has full motor function.  The left lower extremity has sensation but has decreased motor function.  Initially his pulses were palpable post intervention however today they are not palpable and the toes are cool.  This was also present in his left lower extremity prior to his most recent interventions.  He notes that the pain in his feet is intense with burning and stinging and is almost intolerable for him.  The wounds following his intervention have healed well with no evidence of dehiscence.  Today noninvasive studies show an ABI of 0.98 on the  right and 0.75 on the left.  He has a TBI of 0.64 on the right and 0.45 on the left.  Right lower extremity shows mixed waveforms throughout with monophasic waveforms being near the distal bypass graft and common femoral artery with mixed biphasic/hyperemic waveforms in the right.  The left lower extremity has notable monophasic/hyperemic waveforms throughout with a noted significant 50 to 74% stenosis in the distal SFA.  The toe waveforms bilaterally are severely dampened and nearly flat.   Review of Systems  Musculoskeletal:  Positive for gait problem.  Neurological:  Positive for numbness.  All other systems reviewed and are negative.     Objective:   Physical Exam Vitals reviewed.  HENT:     Head: Normocephalic.  Cardiovascular:     Rate and Rhythm: Normal rate.     Pulses:          Dorsalis pedis pulses are detected w/ Doppler on the right side and detected w/ Doppler on the left side.       Posterior tibial pulses are detected w/ Doppler on the right side and detected w/ Doppler on the left side.  Pulmonary:     Effort: Pulmonary effort is normal.  Skin:    General: Skin is cool and dry.       Neurological:     Mental Status: He is alert and oriented to person, place, and time.     Gait: Gait abnormal.  Psychiatric:        Mood and Affect: Mood normal.  Behavior: Behavior normal.        Thought Content: Thought content normal.        Judgment: Judgment normal.    BP 109/72 (BP Location: Left Arm)    Ht 5\' 8"  (1.727 m)    Wt 149 lb 12.8 oz (67.9 kg)    BMI 22.78 kg/m   Past Medical History:  Diagnosis Date   Anxiety    Atherosclerotic PVD with intermittent claudication (HCC)    Back pain    Collagen vascular disease (HCC)    Dyspnea    Dysrhythmia    Elevated lipids    Hypertension    Nausea    Seropositive rheumatoid arthritis (HCC)     Social History   Socioeconomic History   Marital status: Married    Spouse name: Not on file   Number of  children: Not on file   Years of education: Not on file   Highest education level: Not on file  Occupational History   Not on file  Tobacco Use   Smoking status: Every Day    Packs/day: 0.25    Years: 16.00    Pack years: 4.00    Types: Cigarettes   Smokeless tobacco: Never  Vaping Use   Vaping Use: Never used  Substance and Sexual Activity   Alcohol use: No   Drug use: Not Currently   Sexual activity: Yes    Partners: Female  Other Topics Concern   Not on file  Social History Narrative   Not on file   Social Determinants of Health   Financial Resource Strain: Not on file  Food Insecurity: Not on file  Transportation Needs: Not on file  Physical Activity: Not on file  Stress: Not on file  Social Connections: Not on file  Intimate Partner Violence: Not on file    Past Surgical History:  Procedure Laterality Date   AORTA - BILATERAL FEMORAL ARTERY BYPASS GRAFT Bilateral 06/01/2021   Procedure: AORTA BIFEMORAL BYPASS GRAFT;  Surgeon: Katha Cabal, MD;  Location: [facility];  Service: Vascular;  Laterality: Bilateral;   COLONOSCOPY WITH PROPOFOL N/A 04/21/2018   Procedure: COLONOSCOPY WITH PROPOFOL;  Surgeon: Jonathon Bellows, MD;  Location: [facility] ENDOSCOPY;  Service: Gastroenterology;  Laterality: N/A;   ENDARTERECTOMY POPLITEAL Right 06/02/2021   Procedure: Right Femoral Endarterectomy and Patching Endoplasty;  Surgeon: Serafina Mitchell, MD;  Location: [facility];  Service: Vascular;  Laterality: Right;   ESOPHAGOGASTRODUODENOSCOPY (EGD) WITH PROPOFOL N/A 04/21/2018   Procedure: ESOPHAGOGASTRODUODENOSCOPY (EGD) WITH PROPOFOL;  Surgeon: Jonathon Bellows, MD;  Location: [facility];  Service: Gastroenterology;  Laterality: N/A;   HIP PINNING Right    LOWER EXTREMITY ANGIOGRAPHY N/A 05/29/2021   Procedure: LOWER EXTREMITY ANGIOGRAPHY;  Surgeon: Katha Cabal, MD;  Location: [facility];  Service: Cardiovascular;  Laterality: N/A;   TOTAL HIP ARTHROPLASTY Right  10/22/2016   Procedure: TOTAL HIP ARTHROPLASTY ANTERIOR APPROACH;  Surgeon: Hessie Knows, MD;  Location: [facility];  Service: Orthopedics;  Laterality: Right;    History reviewed. No pertinent family history.  No Known Allergies  CBC Latest Ref Rng & Units 06/05/2021 06/04/2021 06/03/2021  WBC 4.0 - 10.5 K/uL 9.5 10.9(H) 14.2(H)  Hemoglobin 13.0 - 17.0 g/dL 8.4(L) 8.4(L) 9.0(L)  Hematocrit 39.0 - 52.0 % 24.3(L) 24.7(L) 26.2(L)  Platelets 150 - 400 K/uL 153 139(L) 141(L)      CMP     Component Value Date/Time   NA 139 06/05/2021 0213   NA 140 03/30/2018 1404   K  3.5 06/05/2021 0213   CL 107 06/05/2021 0213   CO2 29 06/05/2021 0213   GLUCOSE 124 (H) 06/05/2021 0213   BUN 17 06/05/2021 0213   BUN 9 03/30/2018 1404   CREATININE 0.64 06/05/2021 0213   CALCIUM 8.5 (L) 06/05/2021 0213   PROT 7.3 03/30/2018 1404   ALBUMIN 4.6 03/30/2018 1404   AST 41 (H) 03/30/2018 1404   ALT 37 03/30/2018 1404   ALKPHOS 79 03/30/2018 1404   BILITOT 0.3 03/30/2018 1404   GFRNONAA >60 06/05/2021 0213   GFRAA 129 03/30/2018 1404     No results found.     Assessment & Plan:   1. 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HCC)  Recommend:  The patient has evidence of severe atherosclerotic changes of both lower extremities associated with ulceration and tissue loss of the foot.  This represents a limb threatening ischemia and places the patient at the risk for limb loss.  Patient should undergo angiography of the lower extremities with the hope for intervention for limb salvage.  The risks and benefits as well as the alternative therapies was discussed in detail with the patient.  All questions were answered.  Patient agrees to proceed with angiography.  The patient will follow up with me in the office after the procedure.    2. Benign essential hypertension Continue antihypertensive medications as already ordered, these medications have been reviewed and there are no changes at  this time.   3. Current smoker Smoking cessation was discussed, 3-10 minutes spent on this topic specifically.  Patient also advised how absolutely imperative it is at this time to stop smoking.  4. Mixed hyperlipidemia Continue statin as ordered and reviewed, no changes at this time    Current Outpatient Medications on File Prior to Visit  Medication Sig Dispense Refill   acetaminophen (TYLENOL) 500 MG tablet Take 1,000 mg by mouth every 6 (six) hours as needed (pain.).     amLODipine (NORVASC) 10 MG tablet Take 0.5 tablets (5 mg total) by mouth in the morning. 90 tablet 0   aspirin EC 81 MG EC tablet Take 1 tablet (81 mg total) by mouth daily. Swallow whole. 30 tablet 11   atorvastatin (LIPITOR) 80 MG tablet Take 1 tablet (80 mg total) by mouth daily. 90 tablet 1   clopidogrel (PLAVIX) 75 MG tablet Take 1 tablet (75 mg total) by mouth daily. 90 tablet 1   cyclobenzaprine (FLEXERIL) 10 MG tablet Take 10 mg by mouth every 8 (eight) hours as needed for muscle spasms.     lisinopril (ZESTRIL) 20 MG tablet Take 20 mg by mouth daily.     nicotine (NICODERM CQ - DOSED IN MG/24 HOURS) 21 mg/24hr patch Place 21 mg onto the skin daily.     nicotine polacrilex (NICORETTE) 4 MG gum Take 4 mg by mouth as needed for smoking cessation.     ondansetron (ZOFRAN) 8 MG tablet Take 8 mg by mouth every 8 (eight) hours as needed.     Vitamin D, Ergocalciferol, (DRISDOL) 50000 units CAPS capsule Take 50,000 Units by mouth every Thursday.     No current facility-administered medications on file prior to visit.    There are no Patient Instructions on file for this visit. No follow-ups on file.   Kris Hartmann, NP

## 2021-07-04 NOTE — Progress Notes (Signed)
° °Subjective:  ° ° Patient ID: Jeffrey Teston Jr., male    DOB: 07/27/1980, 40 y.o.   MRN: [id number] °Chief Complaint  °Patient presents with  ° Follow-up  ° ° °Jeffrey Grant is a 40-year-old male that presents today for follow-up following multiple interventions including the first on 06/01/2021 which entailed: ° °PROCEDURE: °1. Aortobifemoral bypass grafting with 12 x 6 bifurcated dacryon graft °2.   Right common femoral endarterectomy °2. Right superficial femoral endarterectomy °3. Left common femoral endarterectomy °4. Left superficial femoral endarterectomy °  °He had a subsequent intervention on 06/02/2021 including, for ischemia following his intervention: ° °Procedure:   Right superficial femoral endarterectomy with CorMatrix patch angioplasty ° °Initially after surgery the patient noted no pain and being able to walk well however recently he began to have severe pain in his feet.  Initially it was to be related to nerve pain and issues however he notes that the pain is worse when he is sleeping and it gets better when he gets up in the morning.  However, recently it has been consistent no matter what position his legs are in.  The patient had wounds on his feet that have healed however there is an ulceration on his left lower extremity that has continued to be slow to heal.  Currently the patient notes that he has no sensation in his right lower extremity but has full motor function.  The left lower extremity has sensation but has decreased motor function.  Initially his pulses were palpable post intervention however today they are not palpable and the toes are cool.  This was also present in his left lower extremity prior to his most recent interventions.  He notes that the pain in his feet is intense with burning and stinging and is almost intolerable for him.  The wounds following his intervention have healed well with no evidence of dehiscence. ° °Today noninvasive studies show an ABI of 0.98 on the  right and 0.75 on the left.  He has a TBI of 0.64 on the right and 0.45 on the left.  Right lower extremity shows mixed waveforms throughout with monophasic waveforms being near the distal bypass graft and common femoral artery with mixed biphasic/hyperemic waveforms in the right.  The left lower extremity has notable monophasic/hyperemic waveforms throughout with a noted significant 50 to 74% stenosis in the distal SFA.  The toe waveforms bilaterally are severely dampened and nearly flat. ° ° °Review of Systems  °Musculoskeletal:  Positive for gait problem.  °Neurological:  Positive for numbness.  °All other systems reviewed and are negative. ° °   °Objective:  ° Physical Exam °Vitals reviewed.  °HENT:  °   Head: Normocephalic.  °Cardiovascular:  °   Rate and Rhythm: Normal rate.  °   Pulses:     °     Dorsalis pedis pulses are detected w/ Doppler on the right side and detected w/ Doppler on the left side.  °     Posterior tibial pulses are detected w/ Doppler on the right side and detected w/ Doppler on the left side.  °Pulmonary:  °   Effort: Pulmonary effort is normal.  °Skin: °   General: Skin is cool and dry.  ° °    °Neurological:  °   Mental Status: He is alert and oriented to person, place, and time.  °   Gait: Gait abnormal.  °Psychiatric:     °   Mood and Affect: Mood normal.     °     Behavior: Behavior normal.        Thought Content: Thought content normal.        Judgment: Judgment normal.    BP 109/72 (BP Location: Left Arm)    Ht 5\' 8"  (1.727 m)    Wt 149 lb 12.8 oz (67.9 kg)    BMI 22.78 kg/m   Past Medical History:  Diagnosis Date   Anxiety    Atherosclerotic PVD with intermittent claudication (HCC)    Back pain    Collagen vascular disease (HCC)    Dyspnea    Dysrhythmia    Elevated lipids    Hypertension    Nausea    Seropositive rheumatoid arthritis (HCC)     Social History   Socioeconomic History   Marital status: Married    Spouse name: Not on file   Number of  children: Not on file   Years of education: Not on file   Highest education level: Not on file  Occupational History   Not on file  Tobacco Use   Smoking status: Every Day    Packs/day: 0.25    Years: 16.00    Pack years: 4.00    Types: Cigarettes   Smokeless tobacco: Never  Vaping Use   Vaping Use: Never used  Substance and Sexual Activity   Alcohol use: No   Drug use: Not Currently   Sexual activity: Yes    Partners: Female  Other Topics Concern   Not on file  Social History Narrative   Not on file   Social Determinants of Health   Financial Resource Strain: Not on file  Food Insecurity: Not on file  Transportation Needs: Not on file  Physical Activity: Not on file  Stress: Not on file  Social Connections: Not on file  Intimate Partner Violence: Not on file    Past Surgical History:  Procedure Laterality Date   AORTA - BILATERAL FEMORAL ARTERY BYPASS GRAFT Bilateral 06/01/2021   Procedure: AORTA BIFEMORAL BYPASS GRAFT;  Surgeon: Katha Cabal, MD;  Location: [facility];  Service: Vascular;  Laterality: Bilateral;   COLONOSCOPY WITH PROPOFOL N/A 04/21/2018   Procedure: COLONOSCOPY WITH PROPOFOL;  Surgeon: Jonathon Bellows, MD;  Location: [facility] ENDOSCOPY;  Service: Gastroenterology;  Laterality: N/A;   ENDARTERECTOMY POPLITEAL Right 06/02/2021   Procedure: Right Femoral Endarterectomy and Patching Endoplasty;  Surgeon: Serafina Mitchell, MD;  Location: [facility];  Service: Vascular;  Laterality: Right;   ESOPHAGOGASTRODUODENOSCOPY (EGD) WITH PROPOFOL N/A 04/21/2018   Procedure: ESOPHAGOGASTRODUODENOSCOPY (EGD) WITH PROPOFOL;  Surgeon: Jonathon Bellows, MD;  Location: [facility] ENDOSCOPY;  Service: Gastroenterology;  Laterality: N/A;   HIP PINNING Right    LOWER EXTREMITY ANGIOGRAPHY N/A 05/29/2021   Procedure: LOWER EXTREMITY ANGIOGRAPHY;  Surgeon: Katha Cabal, MD;  Location: [facility];  Service: Cardiovascular;  Laterality: N/A;   TOTAL HIP ARTHROPLASTY Right  10/22/2016   Procedure: TOTAL HIP ARTHROPLASTY ANTERIOR APPROACH;  Surgeon: Hessie Knows, MD;  Location: [facility];  Service: Orthopedics;  Laterality: Right;    History reviewed. No pertinent family history.  No Known Allergies  CBC Latest Ref Rng & Units 06/05/2021 06/04/2021 06/03/2021  WBC 4.0 - 10.5 K/uL 9.5 10.9(H) 14.2(H)  Hemoglobin 13.0 - 17.0 g/dL 8.4(L) 8.4(L) 9.0(L)  Hematocrit 39.0 - 52.0 % 24.3(L) 24.7(L) 26.2(L)  Platelets 150 - 400 K/uL 153 139(L) 141(L)      CMP     Component Value Date/Time   NA 139 06/05/2021 0213   NA 140 03/30/2018 1404   K  3.5 06/05/2021 0213   CL 107 06/05/2021 0213   CO2 29 06/05/2021 0213   GLUCOSE 124 (H) 06/05/2021 0213   BUN 17 06/05/2021 0213   BUN 9 03/30/2018 1404   CREATININE 0.64 06/05/2021 0213   CALCIUM 8.5 (L) 06/05/2021 0213   PROT 7.3 03/30/2018 1404   ALBUMIN 4.6 03/30/2018 1404   AST 41 (H) 03/30/2018 1404   ALT 37 03/30/2018 1404   ALKPHOS 79 03/30/2018 1404   BILITOT 0.3 03/30/2018 1404   GFRNONAA >60 06/05/2021 0213   GFRAA 129 03/30/2018 1404     No results found.     Assessment & Plan:   1. 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HCC)  Recommend:  The patient has evidence of severe atherosclerotic changes of both lower extremities associated with ulceration and tissue loss of the foot.  This represents a limb threatening ischemia and places the patient at the risk for limb loss.  Patient should undergo angiography of the lower extremities with the hope for intervention for limb salvage.  The risks and benefits as well as the alternative therapies was discussed in detail with the patient.  All questions were answered.  Patient agrees to proceed with angiography.  The patient will follow up with me in the office after the procedure.    2. Benign essential hypertension Continue antihypertensive medications as already ordered, these medications have been reviewed and there are no changes at  this time.   3. Current smoker Smoking cessation was discussed, 3-10 minutes spent on this topic specifically.  Patient also advised how absolutely imperative it is at this time to stop smoking.  4. Mixed hyperlipidemia Continue statin as ordered and reviewed, no changes at this time    Current Outpatient Medications on File Prior to Visit  Medication Sig Dispense Refill   acetaminophen (TYLENOL) 500 MG tablet Take 1,000 mg by mouth every 6 (six) hours as needed (pain.).     amLODipine (NORVASC) 10 MG tablet Take 0.5 tablets (5 mg total) by mouth in the morning. 90 tablet 0   aspirin EC 81 MG EC tablet Take 1 tablet (81 mg total) by mouth daily. Swallow whole. 30 tablet 11   atorvastatin (LIPITOR) 80 MG tablet Take 1 tablet (80 mg total) by mouth daily. 90 tablet 1   clopidogrel (PLAVIX) 75 MG tablet Take 1 tablet (75 mg total) by mouth daily. 90 tablet 1   cyclobenzaprine (FLEXERIL) 10 MG tablet Take 10 mg by mouth every 8 (eight) hours as needed for muscle spasms.     lisinopril (ZESTRIL) 20 MG tablet Take 20 mg by mouth daily.     nicotine (NICODERM CQ - DOSED IN MG/24 HOURS) 21 mg/24hr patch Place 21 mg onto the skin daily.     nicotine polacrilex (NICORETTE) 4 MG gum Take 4 mg by mouth as needed for smoking cessation.     ondansetron (ZOFRAN) 8 MG tablet Take 8 mg by mouth every 8 (eight) hours as needed.     Vitamin D, Ergocalciferol, (DRISDOL) 50000 units CAPS capsule Take 50,000 Units by mouth every Thursday.     No current facility-administered medications on file prior to visit.    There are no Patient Instructions on file for this visit. No follow-ups on file.   Kris Hartmann, NP

## 2021-07-05 ENCOUNTER — Encounter (INDEPENDENT_AMBULATORY_CARE_PROVIDER_SITE_OTHER): Payer: Self-pay | Admitting: Vascular Surgery

## 2021-07-05 ENCOUNTER — Encounter (INDEPENDENT_AMBULATORY_CARE_PROVIDER_SITE_OTHER): Payer: Self-pay

## 2021-07-06 ENCOUNTER — Telehealth (INDEPENDENT_AMBULATORY_CARE_PROVIDER_SITE_OTHER): Payer: Self-pay

## 2021-07-06 NOTE — Telephone Encounter (Signed)
Patient ended up running out of medication and Pharmacy won't fill before tomorrow can we please send in   HYDROcodone-acetaminophen (NORCO/VICODIN) 5-325 MG tablet

## 2021-07-09 ENCOUNTER — Telehealth (INDEPENDENT_AMBULATORY_CARE_PROVIDER_SITE_OTHER): Payer: Self-pay

## 2021-07-09 NOTE — Telephone Encounter (Signed)
He would need to reach out to his PCP for flexeril

## 2021-07-09 NOTE — Telephone Encounter (Signed)
I called and made the pt aware of the NP's instructions. 

## 2021-07-09 NOTE — Telephone Encounter (Signed)
Pt called and left a Vm on the nurses line wanted to know could he have a refill on his flexeril. Please advise.

## 2021-07-09 NOTE — Telephone Encounter (Signed)
error 

## 2021-07-12 ENCOUNTER — Encounter (INDEPENDENT_AMBULATORY_CARE_PROVIDER_SITE_OTHER): Payer: Self-pay | Admitting: Vascular Surgery

## 2021-07-12 ENCOUNTER — Emergency Department: Payer: Medicaid Other

## 2021-07-12 ENCOUNTER — Telehealth (INDEPENDENT_AMBULATORY_CARE_PROVIDER_SITE_OTHER): Payer: Self-pay

## 2021-07-12 ENCOUNTER — Other Ambulatory Visit: Payer: Self-pay

## 2021-07-12 ENCOUNTER — Other Ambulatory Visit (INDEPENDENT_AMBULATORY_CARE_PROVIDER_SITE_OTHER): Payer: Self-pay | Admitting: Nurse Practitioner

## 2021-07-12 ENCOUNTER — Encounter (INDEPENDENT_AMBULATORY_CARE_PROVIDER_SITE_OTHER): Payer: Self-pay

## 2021-07-12 ENCOUNTER — Emergency Department
Admission: EM | Admit: 2021-07-12 | Discharge: 2021-07-12 | Disposition: A | Discharge status: Home / Self Care | Payer: Medicaid Other | Attending: Emergency Medicine | Admitting: Emergency Medicine

## 2021-07-12 DIAGNOSIS — M79605 Pain in left leg: Secondary | ICD-10-CM | POA: Diagnosis not present

## 2021-07-12 DIAGNOSIS — I10 Essential (primary) hypertension: Secondary | ICD-10-CM | POA: Insufficient documentation

## 2021-07-12 DIAGNOSIS — R0789 Other chest pain: Secondary | ICD-10-CM | POA: Insufficient documentation

## 2021-07-12 DIAGNOSIS — M79604 Pain in right leg: Secondary | ICD-10-CM | POA: Diagnosis not present

## 2021-07-12 DIAGNOSIS — Z7902 Long term (current) use of antithrombotics/antiplatelets: Secondary | ICD-10-CM | POA: Diagnosis not present

## 2021-07-12 DIAGNOSIS — G8918 Other acute postprocedural pain: Secondary | ICD-10-CM

## 2021-07-12 LAB — CBC
HCT: 35.1 % — ABNORMAL LOW (ref 39.0–52.0)
Hemoglobin: 11.4 g/dL — ABNORMAL LOW (ref 13.0–17.0)
MCH: 29.5 pg (ref 26.0–34.0)
MCHC: 32.5 g/dL (ref 30.0–36.0)
MCV: 90.9 fL (ref 80.0–100.0)
Platelets: 387 10*3/uL (ref 150–400)
RBC: 3.86 MIL/uL — ABNORMAL LOW (ref 4.22–5.81)
RDW: 15.6 % — ABNORMAL HIGH (ref 11.5–15.5)
WBC: 10.2 10*3/uL (ref 4.0–10.5)
nRBC: 0 % (ref 0.0–0.2)

## 2021-07-12 LAB — BASIC METABOLIC PANEL
Anion gap: 12 (ref 5–15)
BUN: 25 mg/dL — ABNORMAL HIGH (ref 6–20)
CO2: 28 mmol/L (ref 22–32)
Calcium: 10 mg/dL (ref 8.9–10.3)
Chloride: 97 mmol/L — ABNORMAL LOW (ref 98–111)
Creatinine, Ser: 1.05 mg/dL (ref 0.61–1.24)
GFR, Estimated: >60 mL/min (ref >60)
Glucose, Bld: 185 mg/dL — ABNORMAL HIGH (ref 70–99)
Potassium: 4.2 mmol/L (ref 3.5–5.1)
Sodium: 137 mmol/L (ref 135–145)

## 2021-07-12 LAB — TROPONIN I (HIGH SENSITIVITY)
Troponin I (High Sensitivity): 3 ng/L (ref <18)
Troponin I (High Sensitivity): 6 ng/L (ref <18)

## 2021-07-12 IMAGING — CT CT ANGIO AOBIFEM WO/W CM
2 of 10 series · 11 of 46 positions shown, 14 images · IV contrast (APPLIED)
Comparison: [DATE]

CLINICAL DATA: Recent aortobifemoral bypass, now with worsening
pain and tachycardic. Evaluate stenosis or leak; Recent
aortobifemoral bypass, now with worsening chest and abdominal pain,
tachycardic.

EXAM:
CT ANGIOGRAPHY OF ABDOMINAL AORTA WITH ILIOFEMORAL RUNOFF
TECHNIQUE: Multidetector CT imaging of the abdomen, pelvis and lower
extremities was performed using the standard protocol during bolus
administration of intravenous contrast. Multiplanar CT image
reconstructions and MIPs were obtained to evaluate the vascular
anatomy.

[Series 3: axial arterial upper · axial · arterial · 0.74mm/px · z∈[+293,+1007]mm · 10 of 290 slices shown, 13 images]
[im 35/290  soft-tissue]
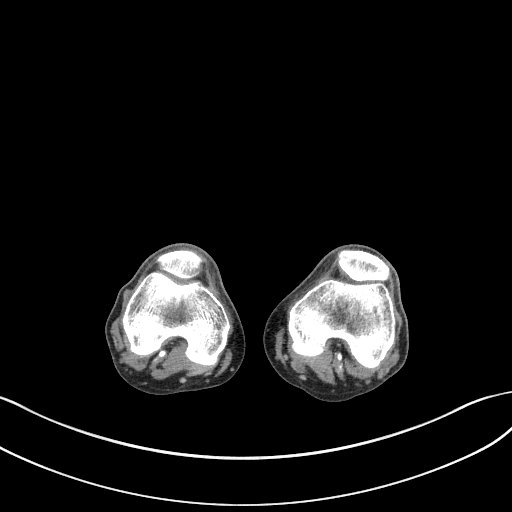
[im 35/290  bone]
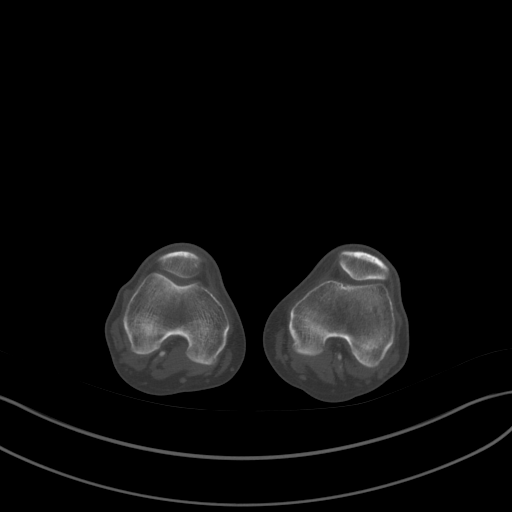
[im 69/290  soft-tissue]
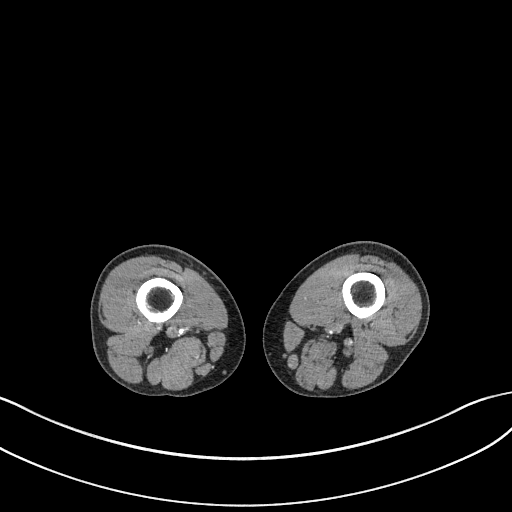
[im 103/290  soft-tissue]
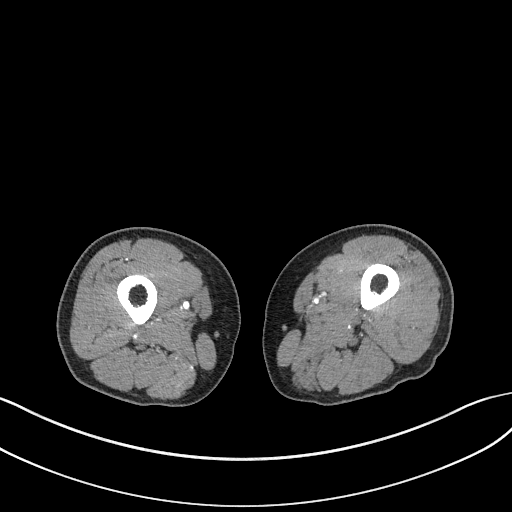
[im 137/290  soft-tissue]
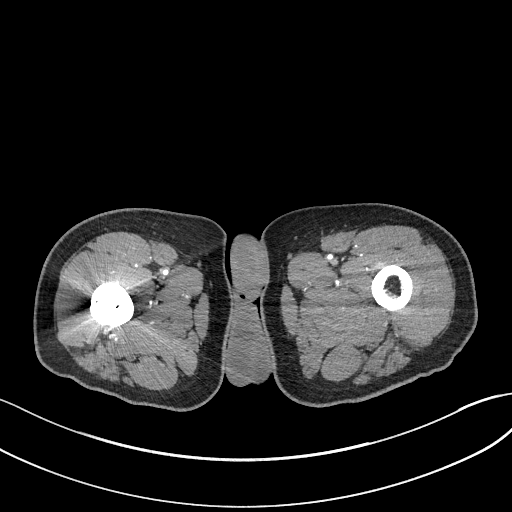
[im 171/290  soft-tissue]
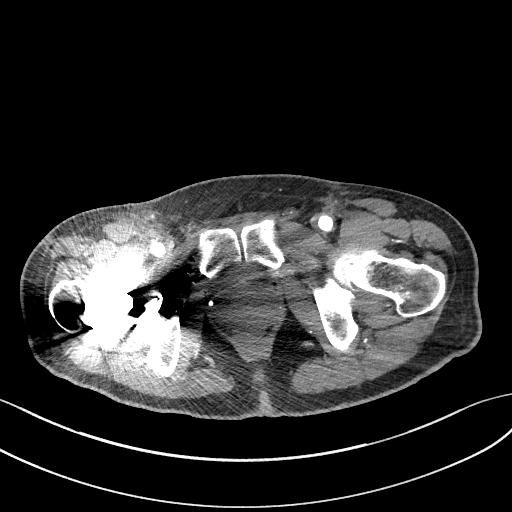
[im 205/290  soft-tissue]
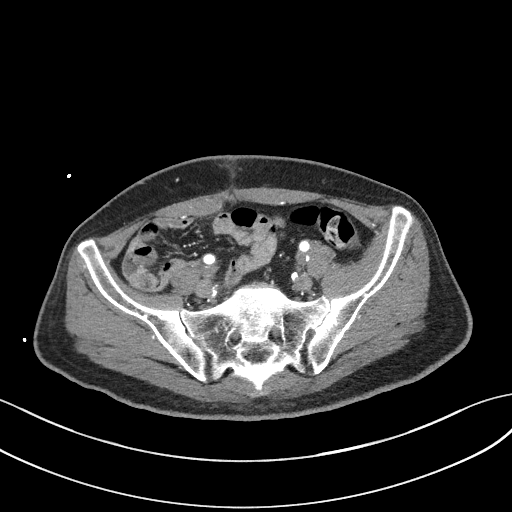
[im 222/290  lung]
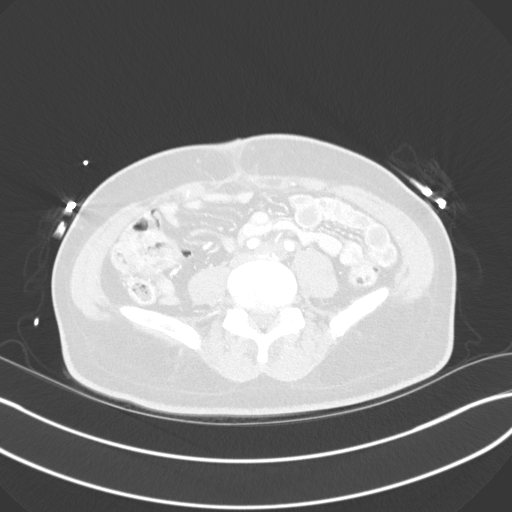
[im 239/290  soft-tissue]
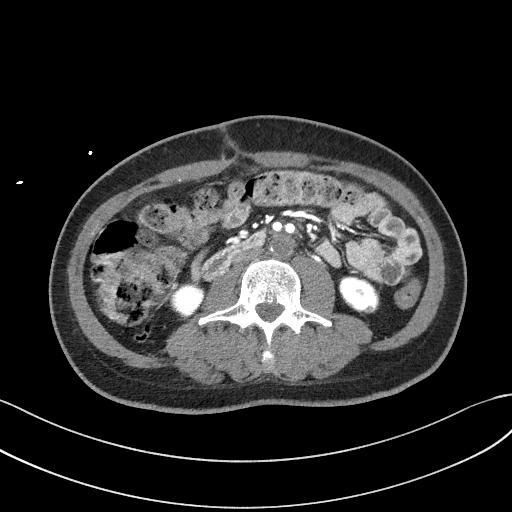
[im 239/290  lung]
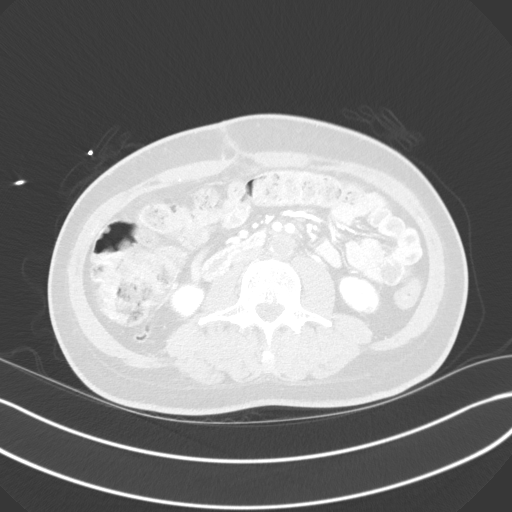
[im 256/290  lung]
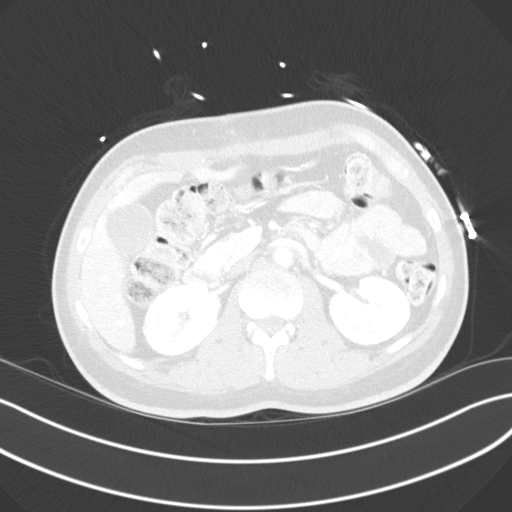
[im 273/290  soft-tissue]
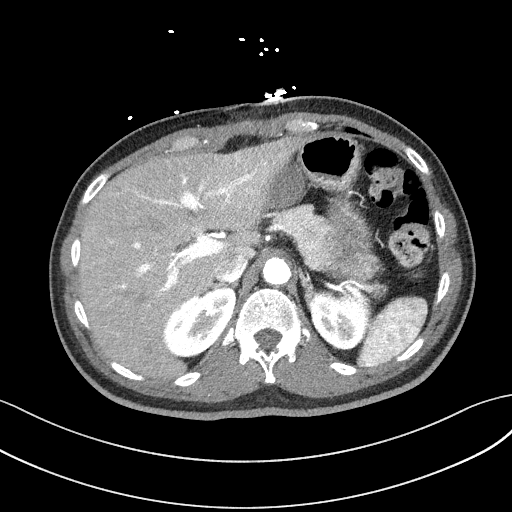
[im 273/290  lung]
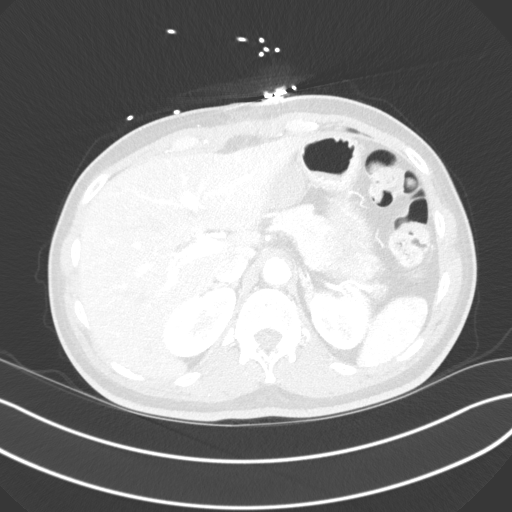

[Series 6: coronal upper · coronal · 0.76mm/px · 1 of 125 slices shown]
[im 63/125  soft-tissue]
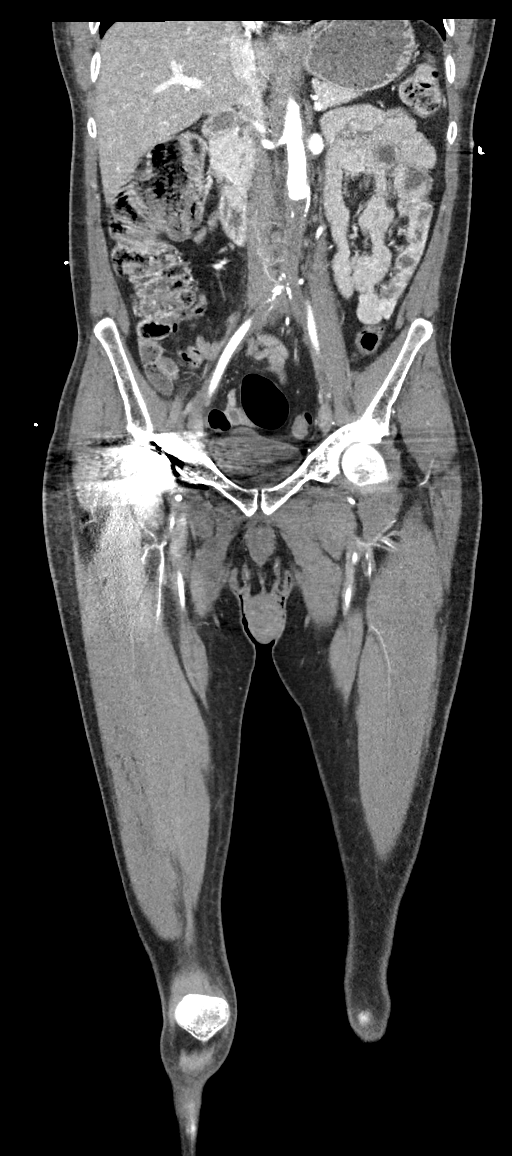

[11 of 46 positions shown; findings below may reference images not displayed]

RADIATION DOSE REDUCTION: This exam was performed according to the
departmental dose-optimization program which includes automated
exposure control, adjustment of the mA and/or kV according to
patient size and/or use of iterative reconstruction technique.

CONTRAST:  150mL OMNIPAQUE IOHEXOL 350 MG/ML SOLN
FINDINGS: CTA chest

Cardiovascular: Preferential opacification of the thoracic aorta. No
evidence of thoracic aortic aneurysm or dissection. Scattered
fibrofatty plaque. The pulmonary arteries are normal in caliber
without central filling defect. Normal heart size. No pericardial
effusion.

Mediastinum/Nodes: No enlarged mediastinal, hilar, or axillary lymph
nodes. The thyroid gland appears normal.

Lungs/Pleura: No pleural effusion. No pneumothorax. No mass or focal
consolidation. No suspicious pulmonary nodules.

CTA abdomen pelvis

VASCULAR

Abdominal Aorta: The patient is status post infrarenal
aortobifemoral bypass graft. The graft material is widely patent
without complicating feature. The infrarenal abdominal aorta and
iliac arteries are completely occluded, with short-segment
retrograde filling of the left distal external iliac artery.

Celiac: Patent without significant stenosis.

SMA: Patent without significant stenosis.

IMA: Occluded proximally.  Reconstitutes distally.

Right Renal artery: Patent without significant stenosis.

Left Renal artery: Patent without significant stenosis.

Right lower extremity

Common iliac artery: Occluded status post bypass graft.

Internal iliac artery: Occluded status post bypass graft. There is
reconstitution of the distal branches.

External iliac artery: Occluded status post bypass graft.

Common femoral artery: Patent distal to the bypass graft
anastomosis.

Profunda femoral artery: Patent without significant stenosis.

Superficial femoral artery: Scattered atherosclerotic disease
results in multifocal up to moderate stenoses, appears most advanced
in the distal SFA near the adductor canal.

Popliteal artery: Atherosclerotic disease results in multifocal
stenoses, appears moderate to severe in the distal popliteal artery.

Tibioperoneal trunk: Atherosclerotic disease results in severe to
nearly occlusive stenoses.

Runoff vessels: The anterior tibial artery is occluded. There is a 2
vessel runoff via the peroneal and posterior tibial arteries.
Suspect atherosclerotic stenoses at the origin of these vessels.

Left lower extremity

Common iliac artery: Occluded status post bypass graft.

Internal iliac artery: Occluded status post bypass graft. There is
reconstitution of the distal branches.

External iliac artery: Occluded status post bypass graft.

Common femoral artery: Patent distal to the bypass graft
anastomosis.

Profunda femoral artery: Patent without significant stenosis.

Superficial femoral artery: Scattered atherosclerotic disease
results in multifocal severe stenosis, most pronounced in the
proximal SFA near its origin and in the distal SFA at the adductor
canal.

Popliteal artery: Patent without significant stenosis or aneurysm.

Tibioperoneal trunk: Patent without significant stenosis.

Runoff vessels: The anterior tibial artery is occluded. There is a 2
vessel runoff via the peroneal and posterior tibial arteries.

NON-VASCULAR

Hepatobiliary: The liver is normal in size without focal
abnormality. No intrahepatic or extrahepatic biliary ductal
dilation. The gallbladder appears normal.

Spleen: Normal in size without focal abnormality.

Pancreas: No pancreatic ductal dilatation or surrounding
inflammatory changes.

Adrenals/Urinary Tract: Adrenal glands are unremarkable. Bilateral
simple renal cysts, stable in appearance. Otherwise normal size and
appearance of the kidneys without hydronephrosis. Bladder is
unremarkable.

Stomach/Bowel: The stomach, small bowel and large bowel are normal
in caliber without abnormal wall thickening or surrounding
inflammatory changes. The appendix is normal.

Reproductive: Prostate is unremarkable.

Lymphatic: No enlarged lymph nodes in the abdomen or pelvis.

Other: No abdominopelvic ascites.

Musculoskeletal: Right hip arthroplasty. Postsurgical changes of the
bilateral groins and midline abdomen, no complicating feature or
fluid collection.
IMPRESSION: VASCULAR

1. Status post aortobifemoral bypass graft without complicating
feature. The bypass graft remains widely patent. No findings of
pseudoaneurysm or dissection.
2. In the right lower extremity, scattered atherosclerotic results
in moderate and severe stenoses of the SFA, popliteal artery and
tibioperoneal trunk as described. There is a 2 vessel runoff via the
posterior tibial and peroneal arteries.
3. In the left lower extremity, scattered atherosclerotic disease
results in multifocal severe stenoses of the SFA as described. There
is a 2 vessel runoff via the posterior tibial and peroneal arteries.

NON-VASCULAR

1. No acute findings in the chest, abdomen or pelvis.

## 2021-07-12 IMAGING — CT CT ANGIO CHEST
4 of 10 series · 15 of 46 positions shown · IV contrast (APPLIED)
Comparison: [DATE]

CLINICAL DATA: Recent aortobifemoral bypass, now with worsening
pain and tachycardic. Evaluate stenosis or leak; Recent
aortobifemoral bypass, now with worsening chest and abdominal pain,
tachycardic.

EXAM:
CT ANGIOGRAPHY OF ABDOMINAL AORTA WITH ILIOFEMORAL RUNOFF
TECHNIQUE: Multidetector CT imaging of the abdomen, pelvis and lower
extremities was performed using the standard protocol during bolus
administration of intravenous contrast. Multiplanar CT image
reconstructions and MIPs were obtained to evaluate the vascular
anatomy.

[Series 6: axial delay · axial · delayed · 0.70mm/px · z∈[+753,+870]mm · 4 of 80 slices shown]
[im 14/80  lung]
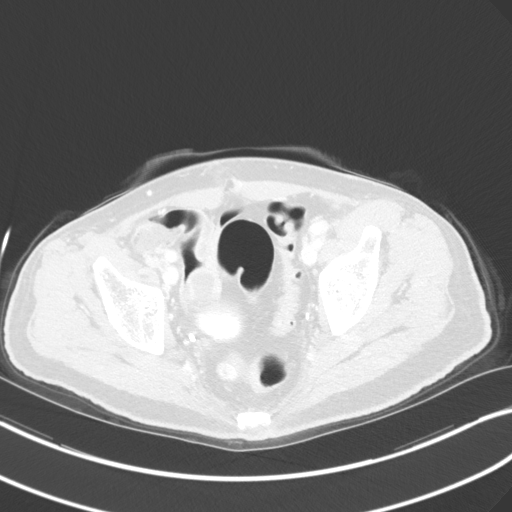
[im 27/80  lung]
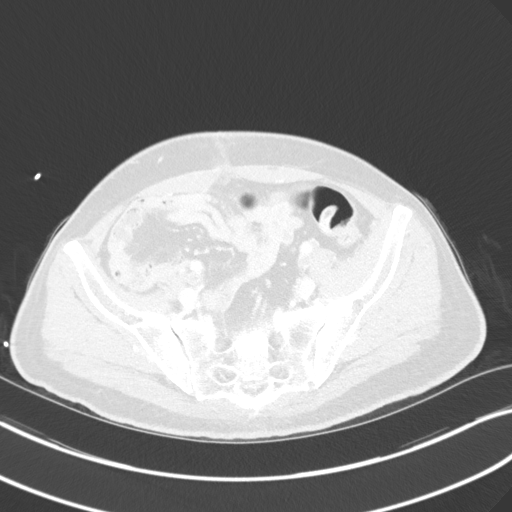
[im 40/80  lung]
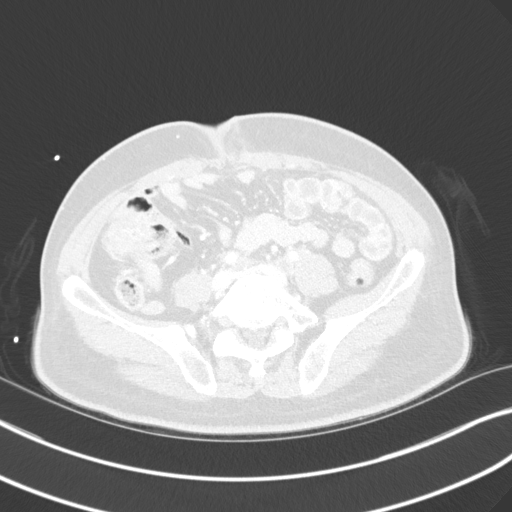
[im 53/80  lung]
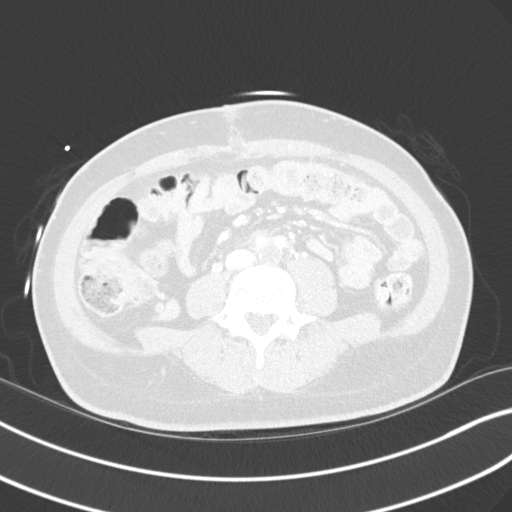

[Series 7: axial arterial · axial · arterial · 0.70mm/px · z∈[+992,+1205]mm · 6 of 101 slices shown]
[im 15/101  lung]
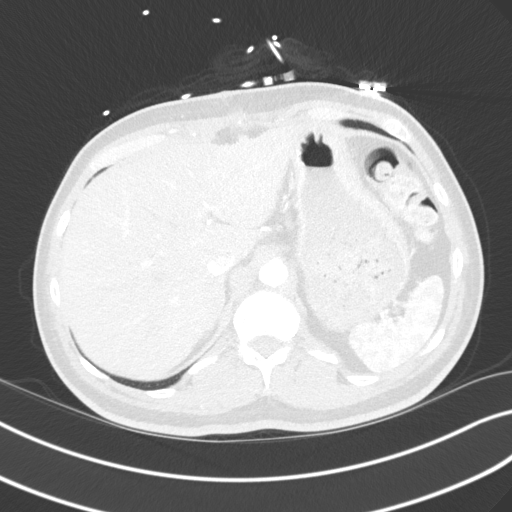
[im 29/101  soft-tissue]
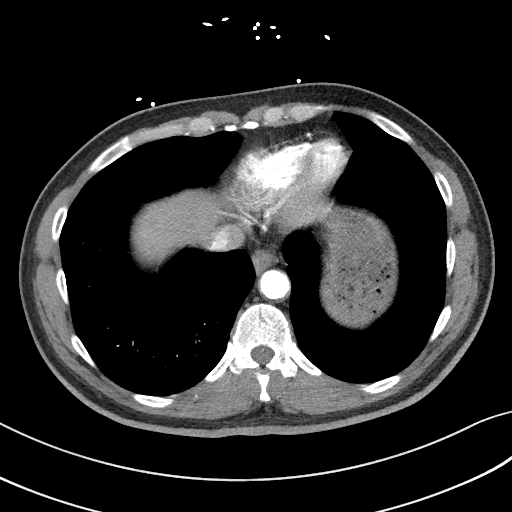
[im 43/101  lung]
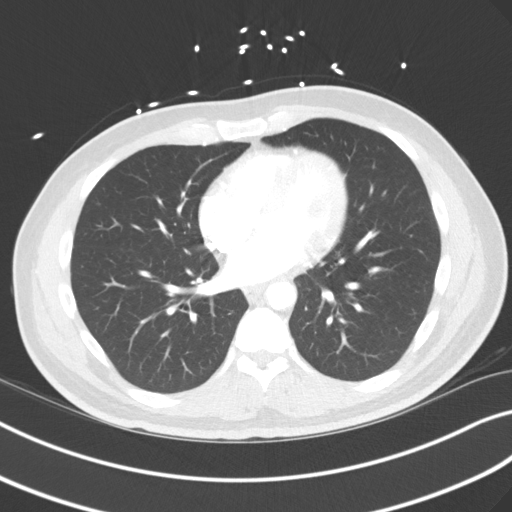
[im 58/101  soft-tissue]
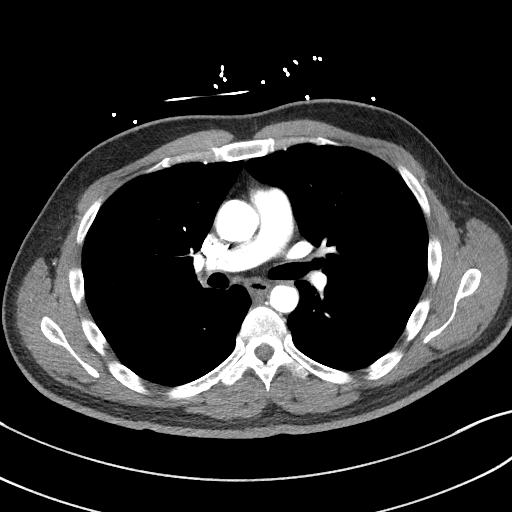
[im 72/101  lung]
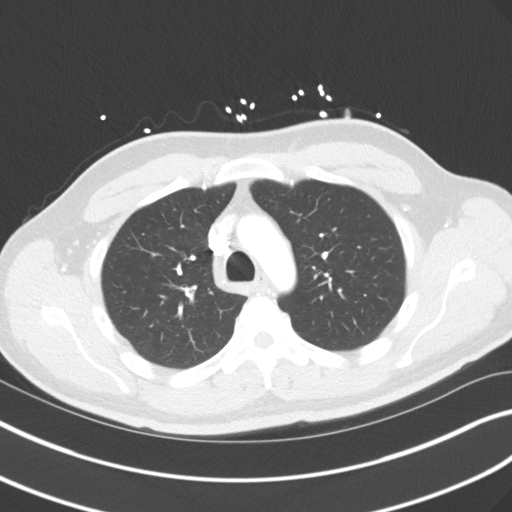
[im 86/101  soft-tissue]
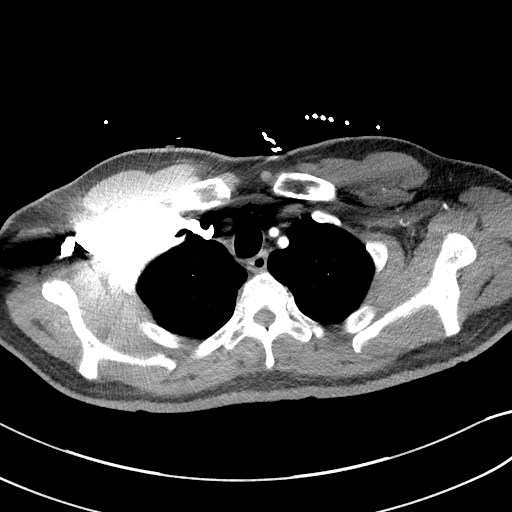

[Series 8: axial pre · axial · non-contrast · 0.70mm/px · z∈[+1025,+1175]mm · 3 of 61 slices shown]
[im 16/61  lung]
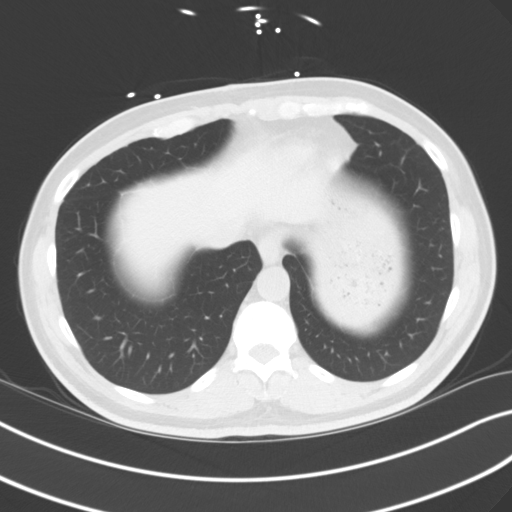
[im 31/61  lung]
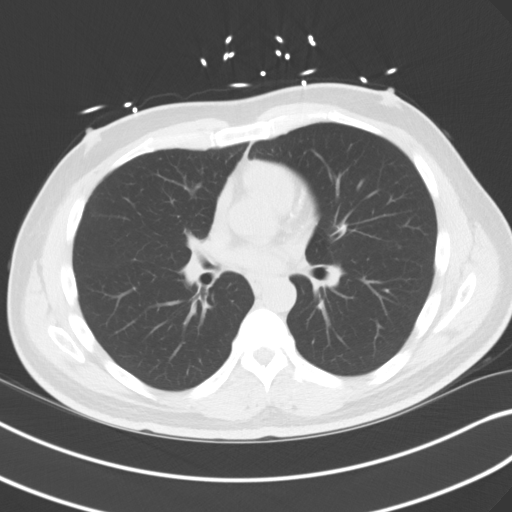
[im 46/61  lung]
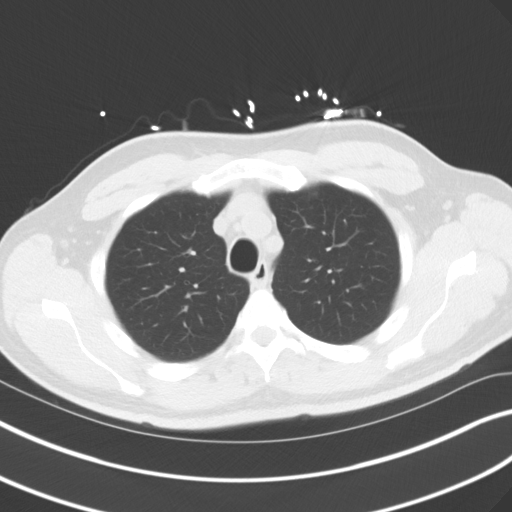

[Series 11: coronals · coronal · 0.59mm/px · 2 of 129 slices shown]
[im 43/129  soft-tissue]
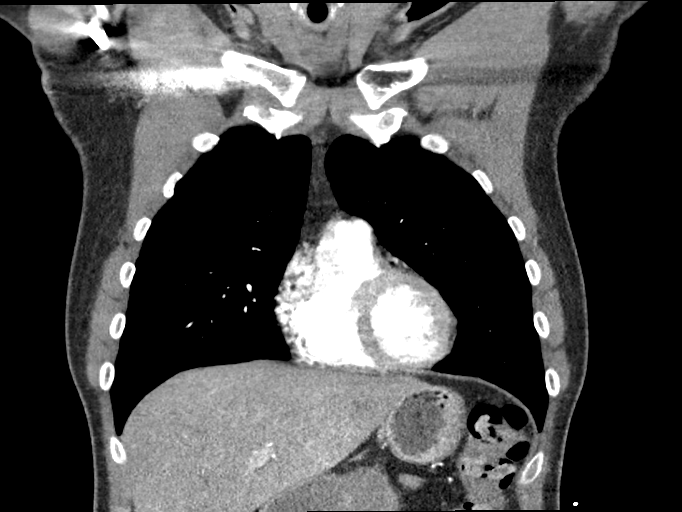
[im 86/129  soft-tissue]
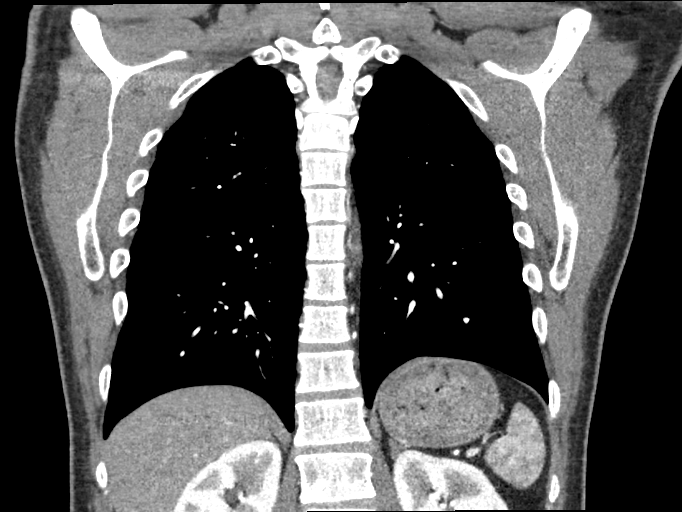

[15 of 46 positions shown; findings below may reference images not displayed]

RADIATION DOSE REDUCTION: This exam was performed according to the
departmental dose-optimization program which includes automated
exposure control, adjustment of the mA and/or kV according to
patient size and/or use of iterative reconstruction technique.

CONTRAST:  150mL OMNIPAQUE IOHEXOL 350 MG/ML SOLN
FINDINGS: CTA chest

Cardiovascular: Preferential opacification of the thoracic aorta. No
evidence of thoracic aortic aneurysm or dissection. Scattered
fibrofatty plaque. The pulmonary arteries are normal in caliber
without central filling defect. Normal heart size. No pericardial
effusion.

Mediastinum/Nodes: No enlarged mediastinal, hilar, or axillary lymph
nodes. The thyroid gland appears normal.

Lungs/Pleura: No pleural effusion. No pneumothorax. No mass or focal
consolidation. No suspicious pulmonary nodules.

CTA abdomen pelvis

VASCULAR

Abdominal Aorta: The patient is status post infrarenal
aortobifemoral bypass graft. The graft material is widely patent
without complicating feature. The infrarenal abdominal aorta and
iliac arteries are completely occluded, with short-segment
retrograde filling of the left distal external iliac artery.

Celiac: Patent without significant stenosis.

SMA: Patent without significant stenosis.

IMA: Occluded proximally.  Reconstitutes distally.

Right Renal artery: Patent without significant stenosis.

Left Renal artery: Patent without significant stenosis.

Right lower extremity

Common iliac artery: Occluded status post bypass graft.

Internal iliac artery: Occluded status post bypass graft. There is
reconstitution of the distal branches.

External iliac artery: Occluded status post bypass graft.

Common femoral artery: Patent distal to the bypass graft
anastomosis.

Profunda femoral artery: Patent without significant stenosis.

Superficial femoral artery: Scattered atherosclerotic disease
results in multifocal up to moderate stenoses, appears most advanced
in the distal SFA near the adductor canal.

Popliteal artery: Atherosclerotic disease results in multifocal
stenoses, appears moderate to severe in the distal popliteal artery.

Tibioperoneal trunk: Atherosclerotic disease results in severe to
nearly occlusive stenoses.

Runoff vessels: The anterior tibial artery is occluded. There is a 2
vessel runoff via the peroneal and posterior tibial arteries.
Suspect atherosclerotic stenoses at the origin of these vessels.

Left lower extremity

Common iliac artery: Occluded status post bypass graft.

Internal iliac artery: Occluded status post bypass graft. There is
reconstitution of the distal branches.

External iliac artery: Occluded status post bypass graft.

Common femoral artery: Patent distal to the bypass graft
anastomosis.

Profunda femoral artery: Patent without significant stenosis.

Superficial femoral artery: Scattered atherosclerotic disease
results in multifocal severe stenosis, most pronounced in the
proximal SFA near its origin and in the distal SFA at the adductor
canal.

Popliteal artery: Patent without significant stenosis or aneurysm.

Tibioperoneal trunk: Patent without significant stenosis.

Runoff vessels: The anterior tibial artery is occluded. There is a 2
vessel runoff via the peroneal and posterior tibial arteries.

NON-VASCULAR

Hepatobiliary: The liver is normal in size without focal
abnormality. No intrahepatic or extrahepatic biliary ductal
dilation. The gallbladder appears normal.

Spleen: Normal in size without focal abnormality.

Pancreas: No pancreatic ductal dilatation or surrounding
inflammatory changes.

Adrenals/Urinary Tract: Adrenal glands are unremarkable. Bilateral
simple renal cysts, stable in appearance. Otherwise normal size and
appearance of the kidneys without hydronephrosis. Bladder is
unremarkable.

Stomach/Bowel: The stomach, small bowel and large bowel are normal
in caliber without abnormal wall thickening or surrounding
inflammatory changes. The appendix is normal.

Reproductive: Prostate is unremarkable.

Lymphatic: No enlarged lymph nodes in the abdomen or pelvis.

Other: No abdominopelvic ascites.

Musculoskeletal: Right hip arthroplasty. Postsurgical changes of the
bilateral groins and midline abdomen, no complicating feature or
fluid collection.
IMPRESSION: VASCULAR

1. Status post aortobifemoral bypass graft without complicating
feature. The bypass graft remains widely patent. No findings of
pseudoaneurysm or dissection.
2. In the right lower extremity, scattered atherosclerotic results
in moderate and severe stenoses of the SFA, popliteal artery and
tibioperoneal trunk as described. There is a 2 vessel runoff via the
posterior tibial and peroneal arteries.
3. In the left lower extremity, scattered atherosclerotic disease
results in multifocal severe stenoses of the SFA as described. There
is a 2 vessel runoff via the posterior tibial and peroneal arteries.

NON-VASCULAR

1. No acute findings in the chest, abdomen or pelvis.

## 2021-07-12 IMAGING — CR DG CHEST 2V
2 series · 2 of 2 positions shown · non-contrast
Comparison: Chest radiograph [DATE]

CLINICAL DATA: Chest pain.

EXAM:
CHEST - 2 VIEW

[chest pa]
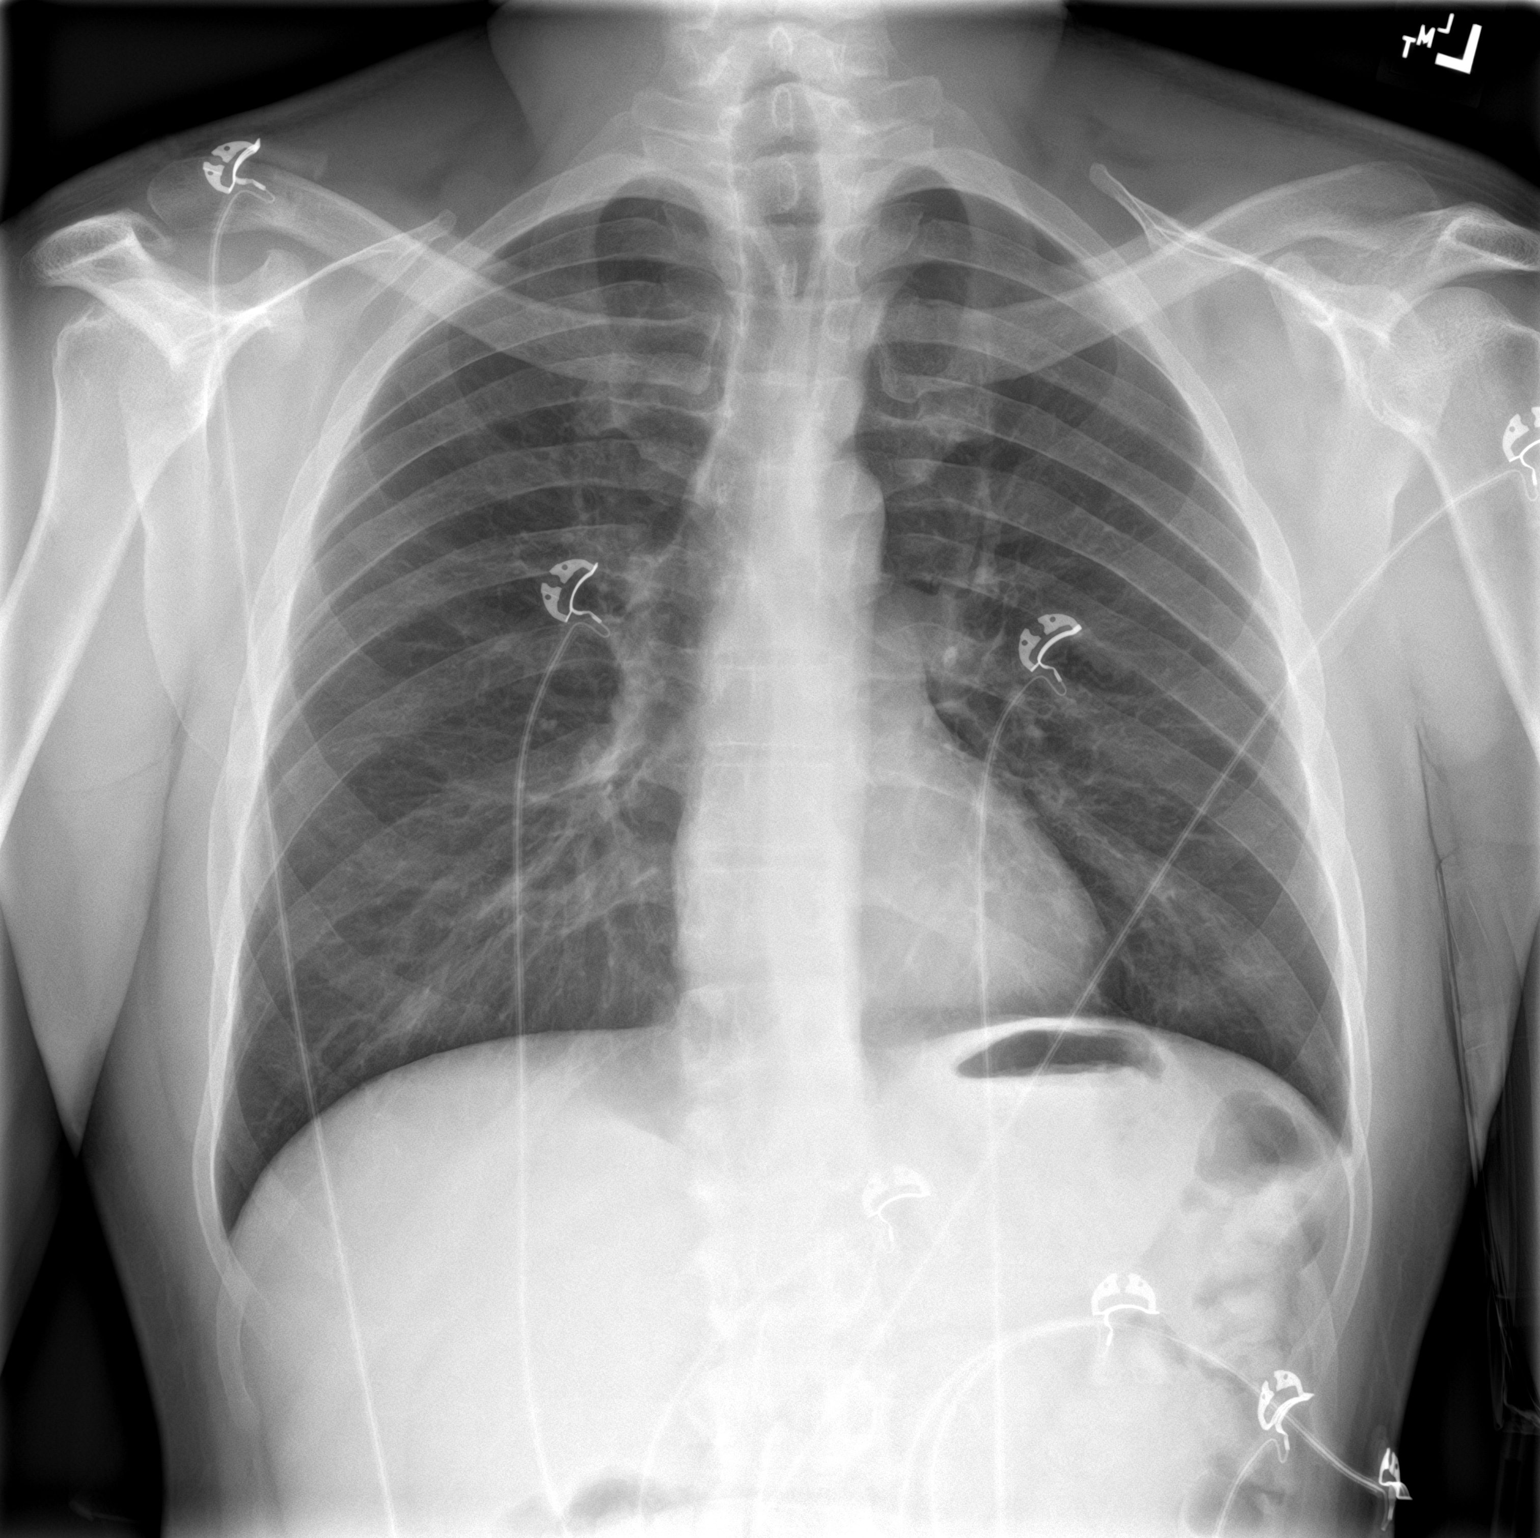

[chest lat]
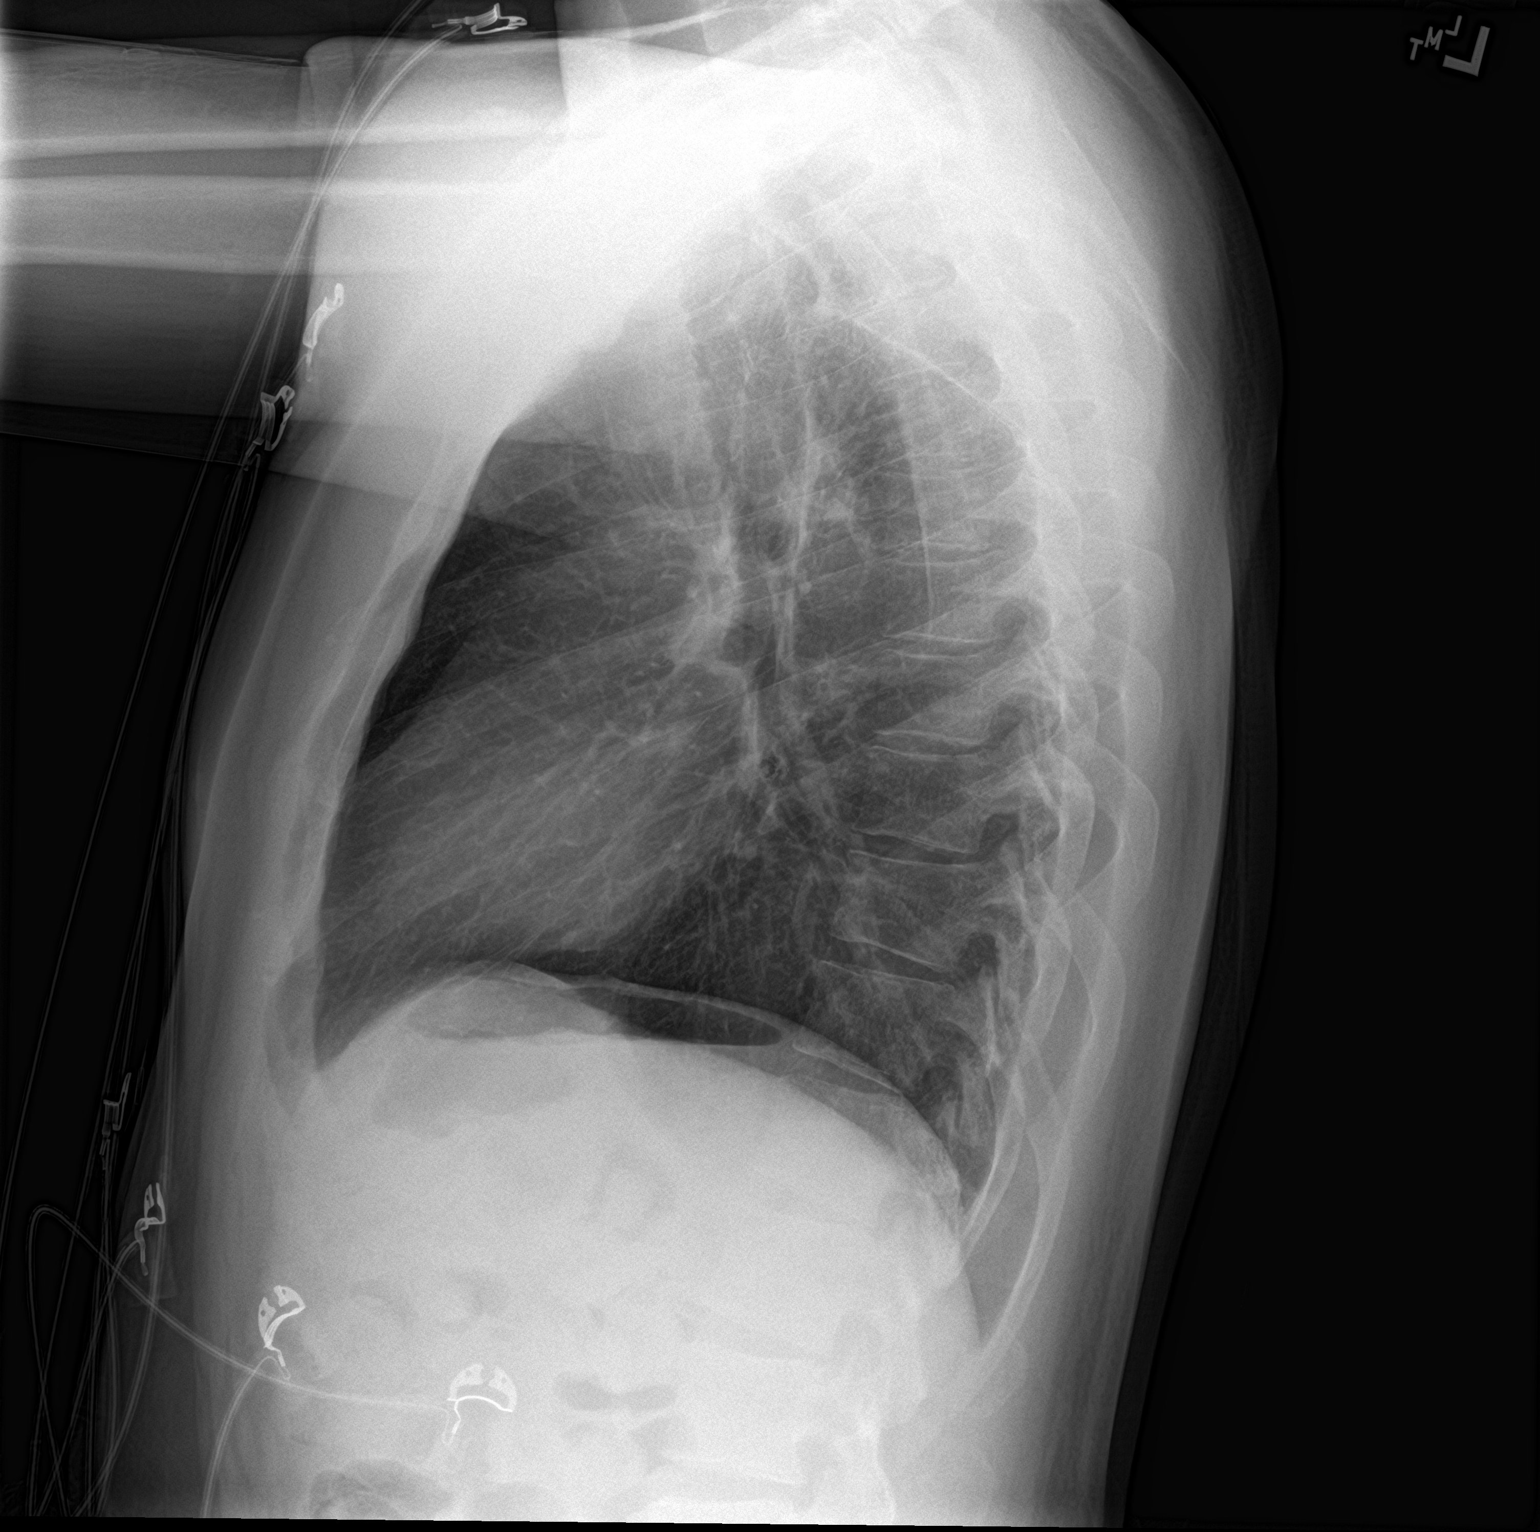

[2 of 2 positions shown; findings below may reference images not displayed]

FINDINGS: The cardiomediastinal silhouette is within normal limits. The lungs
are well inflated. No airspace consolidation, edema, pleural
effusion, pneumothorax is identified. No acute osseous abnormality
is seen.
IMPRESSION: No active cardiopulmonary disease.

## 2021-07-12 MED ORDER — MORPHINE SULFATE (PF) 4 MG/ML IV SOLN
6.0000 mg | Freq: Once | INTRAVENOUS | Status: AC
  Administered 2021-07-12: 6 mg via INTRAVENOUS
  Filled 2021-07-12: qty 2

## 2021-07-12 MED ORDER — HYDROCODONE-ACETAMINOPHEN 5-325 MG PO TABS: 1.0000 | ORAL_TABLET | Freq: Four times a day (QID) | ORAL | 0 refills | Status: DC | PRN

## 2021-07-12 MED ORDER — METHOCARBAMOL 500 MG PO TABS: 500.0000 mg | ORAL_TABLET | Freq: Four times a day (QID) | ORAL | 0 refills | Status: DC | PRN

## 2021-07-12 MED ORDER — IOHEXOL 350 MG/ML SOLN
150.0000 mL | Freq: Once | INTRAVENOUS | Status: AC | PRN
  Administered 2021-07-12: 150 mL via INTRAVENOUS

## 2021-07-12 MED ORDER — LACTATED RINGERS IV BOLUS
1000.0000 mL | Freq: Once | INTRAVENOUS | Status: AC
  Administered 2021-07-12: 1000 mL via INTRAVENOUS

## 2021-07-12 NOTE — Telephone Encounter (Signed)
We actually looked and discussed with the ED before he left.  There isn't any obvious occlusion but it is possible that there is still some small vessel disease that goes past the levels of the scan.  We still plan on doing the procedure to ensure there isn't something the CT scan missed.  We will send in meds

## 2021-07-12 NOTE — Telephone Encounter (Signed)
Pt called and left a VM on the nurses line wanting to make our office aware he was just D/C from the ED where he was seen for BIL LE burning and numbness . A CT angio AO+BI Fem was performed and he would like his treating provider here to take a look at the results . The pt would also like a refill on his pain medication hydrocodone. Please advise.

## 2021-07-12 NOTE — ED Triage Notes (Signed)
Pt c/o numbness to BL LE since last night and having chest pain today pt had recent aorta bypass.

## 2021-07-12 NOTE — ED Notes (Signed)
BP taken in all extremities:  RUE 113/78 LUE 110/97 LLE 92/62 RLE 110/82

## 2021-07-12 NOTE — ED Notes (Signed)
Pt transported to CT ?

## 2021-07-12 NOTE — ED Notes (Signed)
Pt c/o central chest tightness starting last night and increasing numbness and burning in BLE.  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

## 2021-07-12 NOTE — Discharge Instructions (Addendum)
Use Tylenol for pain and fevers.  Up to 1000 mg per dose, up to 4 times per day.  Do not take more than 4000 mg of Tylenol/acetaminophen within 24 hours..  Use Robaxin muscle relaxer on top of the Tylenol and hydrocodone and gabapentin/Neurontin for your pain.  Do not mix muscle relaxers.  Robaxin will probably work a little bit better than Flexeril, and both together will make you very sleepy.  Follow-up with Dr. Gilda CreaseSchnier in the clinic and return to the ED with any severely worsening pain or fevers.

## 2021-07-12 NOTE — ED Provider Notes (Signed)
,  [facility] Provider Note    Event Date/Time   First MD Initiated Contact with Patient 07/12/21 [phone]     (approximate)   History   Chest Pain and Numbness   HPI  Jeffrey Abel. is a 41 y.o. male who presents to the ED for evaluation of Chest Pain and Numbness   I review outpatient vascular surgery visit from 1/11.  In December patient had multiple vascular procedures, including aortobifemoral bypass, femoral endarterectomies bilaterally and follow-up right fem angioplasty.  He is on DAPT with Plavix.  HTN and HLD.  Patient presents to the ED today, accompanied by his wife, for evaluation of progressively worsening numbness and burning sensation to his bilateral lower legs as well as more acute chest pain.  Reports subacute numbness sensation to his lateral lower leg more so on the left than the right.  Reports this is been progressively worsening since his surgery with associated burning pain for which he takes chronic opiates.  Patient further reports developing chest pain over the past 5 to 6 hours.  Reports the chest pain is easing off now and he feels better.  Alongside the chest pain, he denies any coexisting symptoms such as shortness of breath, back pain or abdominal pain that is new.    Denies any syncope, but does report presyncopal dizziness upon standing for the past few days.   Physical Exam   Triage Vital Signs: ED Triage Vitals  Enc Vitals Group     BP 07/12/21 0837 122/77     Pulse Rate 07/12/21 0837 (!) 138     Resp 07/12/21 0837 18     Temp 07/12/21 0837 98.7 F (37.1 C)     Temp Source 07/12/21 0837 Oral     SpO2 07/12/21 0837 100 %     Weight 07/12/21 0838 150 lb (68 kg)     Height 07/12/21 0838  (1.727 m)     Head Circumference --      Peak Flow --      Pain Score 07/12/21 0838 9     Pain Loc --      Pain Edu? --      Excl. in GC? --     Most recent vital signs: Vitals:   07/12/21 1200 07/12/21 1230  BP:  121/78 119/85  Pulse: (!) 110 (!) 121  Resp: 11 (!) 22  Temp:    SpO2: 100% 100%    General: Awake, no distress.  Pleasant and conversational in full sentences. CV:  Good peripheral perfusion.  Tachycardic and regular. Resp:  Normal effort.  Clear lungs Abd:  No distention.  Benign and nontender.  Well-healing midline surgical incision from his bypass procedures last month.  No dehiscence, erythema or purulence. MSK:  No deformity noted.  Chronic wound that is healing to the medial aspect of his left lower leg, no swelling, erythema, induration or purulence. Palpable DP pulses bilaterally with decent cap refill, less than 4 seconds. Neuro:  No focal deficits appreciated. Other:     ED Results / Procedures / Treatments   Labs (all labs ordered are listed, but only abnormal results are displayed) Labs Reviewed  BASIC METABOLIC PANEL - Abnormal; Notable for the following components:      Result Value   Chloride 97 (*)    Glucose, Bld 185 (*)    BUN 25 (*)    All other components within normal limits  CBC - Abnormal; Notable for the following components:  RBC 3.86 (*)    Hemoglobin 11.4 (*)    HCT 35.1 (*)    RDW 15.6 (*)    All other components within normal limits  TROPONIN I (HIGH SENSITIVITY)  TROPONIN I (HIGH SENSITIVITY)    EKG  Sinus tachycardia with a rate of 139 bpm.  Normal axis and intervals.  Nonspecific ST changes inferiorly without STEMI.  RADIOLOGY CXR reviewed by me without evidence of acute cardiopulmonary pathology. CT reviewed by me  Official radiology report(s): DG Chest 2 View  Result Date: 07/12/2021 CLINICAL DATA:  Chest pain. EXAM: CHEST - 2 VIEW COMPARISON:  Chest radiograph 12/25/2005 FINDINGS: The cardiomediastinal silhouette is within normal limits. The lungs are well inflated. No airspace consolidation, edema, pleural effusion, pneumothorax is identified. No acute osseous abnormality is seen. IMPRESSION: No active cardiopulmonary disease.  Electronically Signed   By: Sebastian Ache M.D.   On: 07/12/2021 09:33   CT ANGIO AO+BIFEM W & OR WO CONTRAST  Result Date: 07/12/2021 CLINICAL DATA:  Recent aortobifemoral bypass, now with worsening pain and tachycardic. Evaluate stenosis or leak; Recent aortobifemoral bypass, now with worsening chest and abdominal pain, tachycardic. EXAM: CT ANGIOGRAPHY OF ABDOMINAL AORTA WITH ILIOFEMORAL RUNOFF TECHNIQUE: Multidetector CT imaging of the abdomen, pelvis and lower extremities was performed using the standard protocol during bolus administration of intravenous contrast. Multiplanar CT image reconstructions and MIPs were obtained to evaluate the vascular anatomy. RADIATION DOSE REDUCTION: This exam was performed according to the departmental dose-optimization program which includes automated exposure control, adjustment of the mA and/or kV according to patient size and/or use of iterative reconstruction technique. CONTRAST:  OMNIPAQUE IOHEXOL 350 MG/ML SOLN COMPARISON:  December 2022 FINDINGS: CTA chest Cardiovascular: Preferential opacification of the thoracic aorta. No evidence of thoracic aortic aneurysm or dissection. Scattered fibrofatty plaque. The pulmonary arteries are normal in caliber without central filling defect. Normal heart size. No pericardial effusion. Mediastinum/Nodes: No enlarged mediastinal, hilar, or axillary lymph nodes. The thyroid gland appears normal. Lungs/Pleura: No pleural effusion. No pneumothorax. No mass or focal consolidation. No suspicious pulmonary nodules. CTA abdomen pelvis VASCULAR Abdominal Aorta: The patient is status post infrarenal aortobifemoral bypass graft. The graft material is widely patent without complicating feature. 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 Celiac: Patent without significant stenosis. SMA: Patent without significant stenosis. IMA:  Occluded proximally.  Reconstitutes distally. Right Renal artery: Patent without significant stenosis. Left Renal artery: Patent without significant stenosis. Right lower extremity Common iliac artery: Occluded status post bypass graft. Internal iliac artery: Occluded status post bypass graft. There is reconstitution of the distal branches. External iliac artery: Occluded status post bypass graft. Common femoral artery: Patent distal to the bypass graft anastomosis. Profunda femoral artery: Patent without significant stenosis. Superficial femoral artery: Scattered atherosclerotic disease results in multifocal up to moderate stenoses, appears most advanced in the distal SFA near the adductor canal. Popliteal artery: Atherosclerotic disease results in multifocal stenoses, appears moderate to severe in the distal popliteal artery. Tibioperoneal trunk: Atherosclerotic disease results in severe to nearly occlusive stenoses. Runoff vessels: The anterior tibial artery is occluded. There is a 2 vessel runoff via the peroneal and posterior tibial arteries. Suspect atherosclerotic stenoses at the origin of these vessels. Left lower extremity Common iliac artery: Occluded status post bypass graft. Internal iliac artery: Occluded status post bypass graft. There is reconstitution of the distal branches. External iliac artery: Occluded status post bypass graft. Common femoral  artery: Patent distal to the bypass graft anastomosis. Profunda femoral artery: Patent without significant stenosis. Superficial femoral artery: Scattered atherosclerotic disease results in multifocal severe stenosis, most pronounced in the proximal SFA near its origin and in the distal SFA at the adductor canal. Popliteal artery: Patent without significant stenosis or aneurysm. Tibioperoneal trunk: Patent without significant stenosis. Runoff vessels: The anterior tibial artery is occluded. There is a 2 vessel runoff via the peroneal and posterior tibial  arteries. NON-VASCULAR Hepatobiliary: The liver is normal in size without focal abnormality. No intrahepatic or extrahepatic biliary ductal dilation. The gallbladder appears normal. Spleen: Normal in size without focal abnormality. Pancreas: No pancreatic ductal dilatation or surrounding inflammatory changes. Adrenals/Urinary Tract: Adrenal glands are unremarkable. Bilateral simple renal cysts, stable in appearance. Otherwise normal size and appearance of the kidneys without hydronephrosis. Bladder is unremarkable. Stomach/Bowel: The stomach, small bowel and large bowel are normal in caliber without abnormal wall thickening or surrounding inflammatory changes. The appendix is normal. Reproductive: Prostate is unremarkable. Lymphatic: No enlarged lymph nodes in the abdomen or pelvis. Other: No abdominopelvic ascites. Musculoskeletal: Right hip arthroplasty. Postsurgical changes of the bilateral groins and midline abdomen, no complicating feature or fluid collection. IMPRESSION: VASCULAR 1. Status post aortobifemoral bypass graft without complicating feature. The bypass graft remains widely patent. No findings of pseudoaneurysm or dissection. 2. In the right lower extremity, scattered atherosclerotic results in moderate and severe stenoses of the SFA, popliteal artery and tibioperoneal trunk as described. There is a 2 vessel runoff via the posterior tibial and peroneal arteries. 3. In the left lower extremity, scattered atherosclerotic disease results in multifocal severe stenoses of the SFA as described. There is a 2 vessel runoff via the posterior tibial and peroneal arteries. NON-VASCULAR 1. No acute findings in the chest, abdomen or pelvis. Electronically Signed   By: Olive Bass M.D.   On: 07/12/2021 11:51   CT ANGIO CHEST AORTA W/CM &/OR WO/CM  Result Date: 07/12/2021 CLINICAL DATA:  Recent aortobifemoral bypass, now with worsening pain and tachycardic. Evaluate stenosis or leak; Recent aortobifemoral  bypass, now with worsening chest and abdominal pain, tachycardic. EXAM: CT ANGIOGRAPHY OF ABDOMINAL AORTA WITH ILIOFEMORAL RUNOFF TECHNIQUE: Multidetector CT imaging of the abdomen, pelvis and lower extremities was performed using the standard protocol during bolus administration of intravenous contrast. Multiplanar CT image reconstructions and MIPs were obtained to evaluate the vascular anatomy. RADIATION DOSE REDUCTION: This exam was performed according to the departmental dose-optimization program which includes automated exposure control, adjustment of the mA and/or kV according to patient size and/or use of iterative reconstruction technique. CONTRAST:  OMNIPAQUE IOHEXOL 350 MG/ML SOLN COMPARISON:  December 2022 FINDINGS: CTA chest Cardiovascular: Preferential opacification of the thoracic aorta. No evidence of thoracic aortic aneurysm or dissection. Scattered fibrofatty plaque. The pulmonary arteries are normal in caliber without central filling defect. Normal heart size. No pericardial effusion. Mediastinum/Nodes: No enlarged mediastinal, hilar, or axillary lymph nodes. The thyroid gland appears normal. Lungs/Pleura: No pleural effusion. No pneumothorax. No mass or focal consolidation. No suspicious pulmonary nodules. CTA abdomen pelvis VASCULAR Abdominal Aorta: The patient is status post infrarenal aortobifemoral bypass graft. The graft material is widely patent without complicating feature. 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 Celiac: Patent without significant stenosis. SMA: Patent without significant stenosis. IMA: Occluded proximally.  Reconstitutes distally. Right Renal artery: Patent without significant stenosis. Left Renal artery: Patent without significant stenosis. Right lower extremity Common  iliac artery: Occluded status post bypass graft. Internal iliac artery: Occluded  status post bypass graft. There is reconstitution of the distal branches. External iliac artery: Occluded status post bypass graft. Common femoral artery: Patent distal to the bypass graft anastomosis. Profunda femoral artery: Patent without significant stenosis. Superficial femoral artery: Scattered atherosclerotic disease results in multifocal up to moderate stenoses, appears most advanced in the distal SFA near the adductor canal. Popliteal artery: Atherosclerotic disease results in multifocal stenoses, appears moderate to severe in the distal popliteal artery. Tibioperoneal trunk: Atherosclerotic disease results in severe to nearly occlusive stenoses. Runoff vessels: The anterior tibial artery is occluded. There is a 2 vessel runoff via the peroneal and posterior tibial arteries. Suspect atherosclerotic stenoses at the origin of these vessels. Left lower extremity Common iliac artery: Occluded status post bypass graft. Internal iliac artery: Occluded status post bypass graft. There is reconstitution of the distal branches. External iliac artery: Occluded status post bypass graft. Common femoral artery: Patent distal to the bypass graft anastomosis. Profunda femoral artery: Patent without significant stenosis. Superficial femoral artery: Scattered atherosclerotic disease results in multifocal severe stenosis, most pronounced in the proximal SFA near its origin and in the distal SFA at the adductor canal. Popliteal artery: Patent without significant stenosis or aneurysm. Tibioperoneal trunk: Patent without significant stenosis. Runoff vessels: The anterior tibial artery is occluded. There is a 2 vessel runoff via the peroneal and posterior tibial arteries. NON-VASCULAR Hepatobiliary: The liver is normal in size without focal abnormality. No intrahepatic or extrahepatic biliary ductal dilation. The gallbladder appears normal. Spleen: Normal in size without focal abnormality. Pancreas: No pancreatic ductal  dilatation or surrounding inflammatory changes. Adrenals/Urinary Tract: Adrenal glands are unremarkable. Bilateral simple renal cysts, stable in appearance. Otherwise normal size and appearance of the kidneys without hydronephrosis. Bladder is unremarkable. Stomach/Bowel: The stomach, small bowel and large bowel are normal in caliber without abnormal wall thickening or surrounding inflammatory changes. The appendix is normal. Reproductive: Prostate is unremarkable. Lymphatic: No enlarged lymph nodes in the abdomen or pelvis. Other: No abdominopelvic ascites. Musculoskeletal: Right hip arthroplasty. Postsurgical changes of the bilateral groins and midline abdomen, no complicating feature or fluid collection. IMPRESSION: VASCULAR 1. Status post aortobifemoral bypass graft without complicating feature. The bypass graft remains widely patent. No findings of pseudoaneurysm or dissection. 2. In the right lower extremity, scattered atherosclerotic results in moderate and severe stenoses of the SFA, popliteal artery and tibioperoneal trunk as described. There is a 2 vessel runoff via the posterior tibial and peroneal arteries. 3. In the left lower extremity, scattered atherosclerotic disease results in multifocal severe stenoses of the SFA as described. There is a 2 vessel runoff via the posterior tibial and peroneal arteries. NON-VASCULAR 1. No acute findings in the chest, abdomen or pelvis. Electronically Signed   By: Olive BassYasser  El-Abd M.D.   On: 07/12/2021 11:51    PROCEDURES and INTERVENTIONS:  .1-3 Lead EKG Interpretation Performed by: Delton PrairieSmith, Marice Guidone, MD Authorized by: Delton PrairieSmith, Arias Weinert, MD     Interpretation: abnormal     ECG rate:  104   ECG rate assessment: tachycardic     Rhythm: sinus tachycardia     Ectopy: none     Conduction: normal    Medications  lactated ringers bolus 1,000 mL (0 mLs Intravenous Stopped 07/12/21 1131)  morphine 4 MG/ML injection 6 mg (6 mg Intravenous Given 07/12/21 0914)  iohexol  (OMNIPAQUE) 350 MG/ML injection 150 mL (150 mLs Intravenous Contrast Given 07/12/21 1041)     IMPRESSION / MDM /  ASSESSMENT AND PLAN / ED COURSE  I reviewed the triage vital signs and the nursing notes.  41 year old vasculopath presents to the ED with acute on subacute bilateral leg pain and numbness, as well as atypical chest pains, ultimately without evidence of acute pathology and suitable for outpatient management.  He is tachycardic on arrival, improving with IV fluids and analgesia.  His exam is quite reassuring to me with palpable DP pulses bilaterally, cap refill is present and his ABIs are actually quite good.  His cardiac work-up is reassuring with a nonischemic EKG and 2 negative troponins.  Blood work without acute derangements on metabolic panel or CBC.  Due to his recent angiographic procedures, CTA obtained of the chest, abdomen and pelvis with bilateral lower extremity runoffs.  No signs of pathology in the thorax, while not perfectly time for PE, no evidence of this or dissection in the chest to cause this pain and tachycardia.  Runoff to the lower extremity demonstrates multifocal areas of stenosis and atherosclerosis, and intact bypass grafting without leaking or associated complications such as dissection.  Overall fairly reassuring, particularly with regards to his external examination with ABIs and cap refill.  I further consult with vascular surgery, who agrees that patient would be suitable for outpatient management.  While I considered admission for this high risk patient, he improves nicely with IV fluids and a single dose of IV morphine, and agreeable for outpatient management.  His pain is controlled and we discussed adding methocarbamol to his analgesic regimen and following up with vascular surgery.  He is suitable for outpatient management.  Clinical Course as of 07/12/21 1302  Thu Jul 12, 2021  0908 Discussed plan of care with patient, wife and nurse.  I asked RN to get  ABIs, we discussed cardiac monitoring and testing, as well as the likely eventuality of a CTA. [DS]  0928 ABIs. Right side is 0.97 Left side is 0.84 [DS]  0928 's are consistent with his outpatient vascular surgery visit on 1/11.  ABI on the left has actually improved a fair bit [DS]  1234 Reassessed and discussed benign work-up and CT scans. [DS]  1238 I consult with Dr. Gilda Crease and we discussed his work-up, presentation and my evaluation.  We discussed palpable pulses, reassuring ABIs and CT results with areas of severe stenosis.  He is reassured by this and think outpatient management with clinic follow-up is reasonable, which I agree with [DS]  1254 Updated the patient of this discussion.  We discussed management at home, following up with vascular and return precautions for the ED. [DS]    Clinical Course User Index [DS] Delton Prairie, MD     FINAL CLINICAL IMPRESSION(S) / ED DIAGNOSES   Final diagnoses:  Other chest pain  Bilateral leg pain     Rx / DC Orders   ED Discharge Orders          Ordered    methocarbamol (ROBAXIN) 500 MG tablet  Every 6 hours PRN        07/12/21 1246             Note:  This document was prepared using Dragon voice recognition software and may include unintentional dictation errors.   Delton Prairie, MD 07/12/21 [phone]

## 2021-07-13 ENCOUNTER — Telehealth (INDEPENDENT_AMBULATORY_CARE_PROVIDER_SITE_OTHER): Payer: Self-pay

## 2021-07-13 NOTE — Telephone Encounter (Signed)
I  spoke to pt and made him aware that his pain medication was sent into his pharmacy.

## 2021-07-13 NOTE — Telephone Encounter (Signed)
I called the pt and made him aware his medication for pain was sent into the pharmacy.

## 2021-07-13 NOTE — Telephone Encounter (Signed)
Spoke with the patient and he is scheduled with Dr. Delana Meyer 07/24/21 for a LLE angio with a 6:45 am arrival time to the [facility]. Pre-procedure instructions were discussed and will be mailed.

## 2021-07-13 NOTE — Telephone Encounter (Signed)
I called the pt and made him aware of the Np's instructions. 

## 2021-07-19 ENCOUNTER — Encounter (INDEPENDENT_AMBULATORY_CARE_PROVIDER_SITE_OTHER): Payer: Self-pay

## 2021-07-19 ENCOUNTER — Other Ambulatory Visit (INDEPENDENT_AMBULATORY_CARE_PROVIDER_SITE_OTHER): Payer: Self-pay | Admitting: Nurse Practitioner

## 2021-07-19 ENCOUNTER — Telehealth (INDEPENDENT_AMBULATORY_CARE_PROVIDER_SITE_OTHER): Payer: Self-pay | Admitting: Nurse Practitioner

## 2021-07-19 MED ORDER — OXYCODONE-ACETAMINOPHEN 7.5-325 MG PO TABS
1.0000 | ORAL_TABLET | Freq: Four times a day (QID) | ORAL | 0 refills | Status: DC | PRN
Start: 2021-07-19 — End: 2021-07-25

## 2021-07-19 NOTE — Telephone Encounter (Signed)
I sent in a stronger med.  This should last him until after his procedure and then depending on what we find, we can discuss next steps

## 2021-07-20 ENCOUNTER — Encounter (INDEPENDENT_AMBULATORY_CARE_PROVIDER_SITE_OTHER): Payer: Self-pay

## 2021-07-20 ENCOUNTER — Encounter (INDEPENDENT_AMBULATORY_CARE_PROVIDER_SITE_OTHER): Payer: Self-pay | Admitting: Vascular Surgery

## 2021-07-24 ENCOUNTER — Encounter (INDEPENDENT_AMBULATORY_CARE_PROVIDER_SITE_OTHER): Payer: Self-pay

## 2021-07-24 ENCOUNTER — Encounter: Payer: Self-pay | Admitting: Vascular Surgery

## 2021-07-24 ENCOUNTER — Ambulatory Visit
Admission: RE | Admit: 2021-07-24 | Discharge: 2021-07-24 | Disposition: A | Discharge status: Home / Self Care | Payer: Medicaid Other | Source: Ambulatory Visit | Attending: Vascular Surgery | Admitting: Vascular Surgery

## 2021-07-24 ENCOUNTER — Encounter
Admission: RE | Disposition: A | Discharge status: Home / Self Care | Payer: Self-pay | Source: Ambulatory Visit | Attending: Vascular Surgery

## 2021-07-24 ENCOUNTER — Other Ambulatory Visit: Payer: Self-pay

## 2021-07-24 ENCOUNTER — Other Ambulatory Visit (INDEPENDENT_AMBULATORY_CARE_PROVIDER_SITE_OTHER): Payer: Self-pay | Admitting: Nurse Practitioner

## 2021-07-24 DIAGNOSIS — L97529 Non-pressure chronic ulcer of other part of left foot with unspecified severity: Secondary | ICD-10-CM | POA: Diagnosis not present

## 2021-07-24 DIAGNOSIS — I70229 Atherosclerosis of native arteries of extremities with rest pain, unspecified extremity: Secondary | ICD-10-CM

## 2021-07-24 DIAGNOSIS — F17210 Nicotine dependence, cigarettes, uncomplicated: Secondary | ICD-10-CM | POA: Diagnosis not present

## 2021-07-24 DIAGNOSIS — I70223 Atherosclerosis of native arteries of extremities with rest pain, bilateral legs: Secondary | ICD-10-CM

## 2021-07-24 DIAGNOSIS — E782 Mixed hyperlipidemia: Secondary | ICD-10-CM | POA: Diagnosis not present

## 2021-07-24 DIAGNOSIS — I10 Essential (primary) hypertension: Secondary | ICD-10-CM | POA: Diagnosis not present

## 2021-07-24 DIAGNOSIS — I70245 Atherosclerosis of native arteries of left leg with ulceration of other part of foot: Secondary | ICD-10-CM | POA: Insufficient documentation

## 2021-07-24 HISTORY — PX: LOWER EXTREMITY ANGIOGRAPHY: CATH118251

## 2021-07-24 LAB — BUN: BUN: 19 mg/dL (ref 6–20)

## 2021-07-24 LAB — CREATININE, SERUM
Creatinine, Ser: 1.22 mg/dL (ref 0.61–1.24)
GFR, Estimated: >60 mL/min (ref >60)

## 2021-07-24 SURGERY — LOWER EXTREMITY ANGIOGRAPHY
Anesthesia: Moderate Sedation | Site: Leg Lower | Laterality: Left

## 2021-07-24 MED ORDER — OXYCODONE HCL 5 MG PO TABS
5.0000 mg | ORAL_TABLET | ORAL | Status: DC | PRN
  Administered 2021-07-24: 5 mg via ORAL

## 2021-07-24 MED ORDER — OXYCODONE HCL 5 MG PO TABS
ORAL_TABLET | ORAL | Status: AC
  Filled 2021-07-24: qty 1

## 2021-07-24 MED ORDER — SODIUM CHLORIDE 0.9 % IV SOLN: 250.0000 mL | INTRAVENOUS | Status: DC | PRN

## 2021-07-24 MED ORDER — MIDAZOLAM HCL 2 MG/ML PO SYRP: 8.0000 mg | ORAL_SOLUTION | Freq: Once | ORAL | Status: DC | PRN

## 2021-07-24 MED ORDER — HEPARIN SODIUM (PORCINE) 1000 UNIT/ML IJ SOLN
INTRAMUSCULAR | Status: DC | PRN
  Administered 2021-07-24: 5000 [IU] via INTRAVENOUS

## 2021-07-24 MED ORDER — FENTANYL CITRATE PF 50 MCG/ML IJ SOSY
PREFILLED_SYRINGE | INTRAMUSCULAR | Status: AC
  Filled 2021-07-24: qty 1

## 2021-07-24 MED ORDER — METHYLPREDNISOLONE SODIUM SUCC 125 MG IJ SOLR: 125.0000 mg | Freq: Once | INTRAMUSCULAR | Status: DC | PRN

## 2021-07-24 MED ORDER — SODIUM CHLORIDE 0.9% FLUSH: 3.0000 mL | Freq: Two times a day (BID) | INTRAVENOUS | Status: DC

## 2021-07-24 MED ORDER — FENTANYL CITRATE (PF) 100 MCG/2ML IJ SOLN
INTRAMUSCULAR | Status: DC | PRN
  Administered 2021-07-24 (×4): 50 ug via INTRAVENOUS

## 2021-07-24 MED ORDER — MIDAZOLAM HCL 2 MG/2ML IJ SOLN
INTRAMUSCULAR | Status: DC | PRN
  Administered 2021-07-24: 1 mg via INTRAVENOUS
  Administered 2021-07-24: 2 mg via INTRAVENOUS
  Administered 2021-07-24 (×2): 1 mg via INTRAVENOUS

## 2021-07-24 MED ORDER — HEPARIN SODIUM (PORCINE) 1000 UNIT/ML IJ SOLN
INTRAMUSCULAR | Status: AC
  Filled 2021-07-24: qty 10

## 2021-07-24 MED ORDER — SODIUM CHLORIDE 0.9 % IV SOLN: INTRAVENOUS | Status: DC

## 2021-07-24 MED ORDER — FAMOTIDINE 20 MG PO TABS: 40.0000 mg | ORAL_TABLET | Freq: Once | ORAL | Status: DC | PRN

## 2021-07-24 MED ORDER — ACETAMINOPHEN 325 MG PO TABS: 650.0000 mg | ORAL_TABLET | ORAL | Status: DC | PRN

## 2021-07-24 MED ORDER — DIPHENHYDRAMINE HCL 50 MG/ML IJ SOLN: 50.0000 mg | Freq: Once | INTRAMUSCULAR | Status: DC | PRN

## 2021-07-24 MED ORDER — FENTANYL CITRATE (PF) 100 MCG/2ML IJ SOLN
INTRAMUSCULAR | Status: AC
  Filled 2021-07-24: qty 2

## 2021-07-24 MED ORDER — HYDRALAZINE HCL 20 MG/ML IJ SOLN: 5.0000 mg | INTRAMUSCULAR | Status: DC | PRN

## 2021-07-24 MED ORDER — SODIUM CHLORIDE 0.9% FLUSH: 3.0000 mL | INTRAVENOUS | Status: DC | PRN

## 2021-07-24 MED ORDER — CEFAZOLIN SODIUM-DEXTROSE 2-4 GM/100ML-% IV SOLN: 2.0000 g | Freq: Once | INTRAVENOUS | Status: AC

## 2021-07-24 MED ORDER — HYDROMORPHONE HCL 1 MG/ML IJ SOLN: 1.0000 mg | Freq: Once | INTRAMUSCULAR | Status: DC | PRN

## 2021-07-24 MED ORDER — MIDAZOLAM HCL 2 MG/2ML IJ SOLN
INTRAMUSCULAR | Status: AC
  Filled 2021-07-24: qty 4

## 2021-07-24 MED ORDER — LABETALOL HCL 5 MG/ML IV SOLN: 10.0000 mg | INTRAVENOUS | Status: DC | PRN

## 2021-07-24 MED ORDER — IODIXANOL 320 MG/ML IV SOLN
INTRAVENOUS | Status: DC | PRN
  Administered 2021-07-24: 40 mL via INTRA_ARTERIAL

## 2021-07-24 MED ORDER — ONDANSETRON HCL 4 MG/2ML IJ SOLN: 4.0000 mg | Freq: Four times a day (QID) | INTRAMUSCULAR | Status: DC | PRN

## 2021-07-24 MED ORDER — CEFAZOLIN SODIUM-DEXTROSE 2-4 GM/100ML-% IV SOLN
INTRAVENOUS | Status: AC
  Administered 2021-07-24: 2 g via INTRAVENOUS
  Filled 2021-07-24: qty 100

## 2021-07-24 MED ORDER — MIDAZOLAM HCL 2 MG/2ML IJ SOLN
INTRAMUSCULAR | Status: AC
  Filled 2021-07-24: qty 2

## 2021-07-24 MED ORDER — MORPHINE SULFATE (PF) 4 MG/ML IV SOLN: 2.0000 mg | INTRAVENOUS | Status: DC | PRN

## 2021-07-24 SURGICAL SUPPLY — 23 items
BALLN LUTONIX 4X150X130 (BALLOONS) ×2
BALLN LUTONIX DCB 4X60X130 (BALLOONS) ×2
BALLOON LUTONIX 4X150X130 (BALLOONS) IMPLANT
BALLOON LUTONIX DCB 4X60X130 (BALLOONS) IMPLANT
CANNULA 5F STIFF (CANNULA) ×1 IMPLANT
CATH ANGIO 5F PIGTAIL 65CM (CATHETERS) ×1 IMPLANT
CATH BEACON 5 .035 65 KMP TIP (CATHETERS) ×1 IMPLANT
COVER PROBE U/S 5X48 (MISCELLANEOUS) ×1 IMPLANT
DEVICE STARCLOSE SE CLOSURE (Vascular Products) ×1 IMPLANT
DRAPE BRACHIAL (DRAPES) ×1 IMPLANT
GAUZE SPONGE 4X4 12PLY STRL (GAUZE/BANDAGES/DRESSINGS) ×3 IMPLANT
GLIDESHEATH SLEND SS 6F .021 (SHEATH) ×1 IMPLANT
GLIDEWIRE ADV .035X260CM (WIRE) ×1 IMPLANT
GUIDEWIRE SUPER STIFF .035X180 (WIRE) ×1 IMPLANT
KIT ENCORE 26 ADVANTAGE (KITS) ×1 IMPLANT
PACK ANGIOGRAPHY (CUSTOM PROCEDURE TRAY) ×2 IMPLANT
SHEATH BRITE TIP 5FRX11 (SHEATH) ×2 IMPLANT
STENT LIFESTENT 5F 5X150X135 (Permanent Stent) ×1 IMPLANT
STENT LIFESTENT 5F 5X60X135 (Permanent Stent) ×1 IMPLANT
SYR MEDRAD MARK 7 150ML (SYRINGE) ×1 IMPLANT
TUBING CONTRAST HIGH PRESS 72 (TUBING) ×1 IMPLANT
WIRE GUIDERIGHT .035X150 (WIRE) ×1 IMPLANT
WIRE MAGIC TORQUE 260C (WIRE) ×1 IMPLANT

## 2021-07-24 NOTE — Progress Notes (Signed)
Attempted to make a follow up with vascular, no callback received at time of d/c. PT instructed to call if they have not heard from the office by the morning. PT states he does not need to speak with Dr. Gilda Crease prior to d/c states he will message him with any concerns. Site stable post bed rest. Stent cards and instructions sent with pt's wife.

## 2021-07-24 NOTE — Op Note (Signed)
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  Left lower extremity angiogram third order catheter placement  2.  Ultrasound-guided access to the left common femoral  3.  StarClose left common femoral  4.  Angioplasty and stent placement left SFA and popliteal artery   Anesthesia: Conscious sedation was administered by the interventional radiology RN under my direct supervision. IV Versed plus fentanyl were utilized. Continuous ECG, pulse oximetry and blood pressure was monitored throughout the entire procedure. Conscious sedation was administered for a total of 45 minutes and 53 seconds.  Sheath: 5 French 11 cm Pinnacle left common femoral antegrade  Contrast: 40 cc   Fluoroscopy Time: 5.3 minutes  Indications: Patient is status post successful aortobifemoral bypass.  However he is complaining of severe pain requiring continued narcotic therapy in both lower extremities particularly in the foot.  He is undergoing angiography for the with the hope for intervention for relief of his rest pain.  Procedure:  Jeffrey Grantis a 41 y.o. male who was identified and appropriate procedural time out was performed.  The patient was then placed supine on the table and prepped and draped in the usual sterile fashion.  Ultrasound was used to evaluate the left common femoral artery and the anastomosis with the aortobifemoral bypass.  It was echolucent and pulsatile indicating it is patent .  An ultrasound image was acquired for the permanent record.  A micropuncture needle was used to access the hood of the bypass over the left common femoral artery under direct ultrasound guidance.  The microwire was then advanced under  fluoroscopic guidance without difficulty followed by the micro-sheath.  A 0.035 J wire was advanced without resistance and a 5Fr sheath was placed.    Hand-injection contrast then performed which demonstrates the sheath is actually in the profunda femoris.  Sheath is pulled back under fluoroscopic guidance.  The 5 French sheath is then negotiated into the superficial femoral artery and hand-injection contrast then used to demonstrate the proximal anatomy.  Imaging distally is inadequate and therefore a Kumpe catheter is advanced down into the trifurcation and ultimately into the tibioperoneal trunk representing third order catheter placement.  Hand-injection contrast used to demonstrate distal runoff.  5000 units of heparin was given.  Wire was exchanged for a Magic torque wire.  A 5 mm x 150 mm life stent is then deployed across Hunter's canal with a distal end in the mid popliteal.  An additional 5 mm x 60 mm life stent is also deployed proximally overlapping the initial stent by approximately 1 cm.  Both stents were postdilated with 4 mm Lutonix drug-eluting balloons inflated to 6 atm for approximately 1 minute.  Follow-up imaging demonstrates less than 5% residual stenosis with preservation of the posterior tibial runoff.  LAO view of the groin is then obtained and a Star close device was successfully deployed without incident.  Light pressure is held.  Findings:   Left Lower Extremity: Initial imaging demonstrates the anastomosis is widely patent there is some reflux of contrast into the left external iliac artery.  The origins of the profunda femoris are widely patent and the profunda femoris is widely patent with extensive collaterals.  The origin of the SFA demonstrates a 25 to 30% narrowing this is not hemodynamically significant.  At [facility]  canal there is a 70% narrowing which on follow-up imaging tapers to a subtotal occlusion in the popliteal artery at the level of the femoral condyles.   This lesion has a length of approximately 200 mm.  The trifurcation demonstrates increasing disease with occlusion of the anterior tibial just after its takeoff.  There is moderate disease of the peroneal.  The posterior tibial and tibioperoneal trunk are widely patent and the posterior tibial fills the plantar vessels which fill the pedal arch and the digital vessels are all imaged.  There is reconstitution of the distal anterior tibial and dorsalis pedis but this does not seem to contribute much to the pedal arch.  As noted above a 60 mm life stent and a 150 mm long life stent are deployed and postdilated to 4 mm with excellent result less than 5% residual stenosis.    Disposition: Patient was taken to the recovery room in stable condition having tolerated the procedure well.  Jeffrey Grant 07/24/2021,9:10 AM

## 2021-07-24 NOTE — Interval H&P Note (Signed)
History and Physical Interval Note:  07/24/2021 8:02 AM  Jeffrey Grant.  has presented today for surgery, with the diagnosis of LLE Angio  BARD   ASO w rest pain.  The various methods of treatment have been discussed with the patient and family. After consideration of risks, benefits and other options for treatment, the patient has consented to  Procedure(s): LOWER EXTREMITY ANGIOGRAPHY (Left) as a surgical intervention.  The patient's history has been reviewed, patient examined, no change in status, stable for surgery.  I have reviewed the patient's chart and labs.  Questions were answered to the patient's satisfaction.     [name]

## 2021-07-25 ENCOUNTER — Other Ambulatory Visit (INDEPENDENT_AMBULATORY_CARE_PROVIDER_SITE_OTHER): Payer: Self-pay | Admitting: Nurse Practitioner

## 2021-07-25 MED ORDER — OXYCODONE-ACETAMINOPHEN 7.5-325 MG PO TABS: 1.0000 | ORAL_TABLET | Freq: Four times a day (QID) | ORAL | 0 refills | Status: DC | PRN

## 2021-07-30 NOTE — Telephone Encounter (Signed)
Let's get him scheduled for a RLE angio with GS

## 2021-07-31 ENCOUNTER — Telehealth (INDEPENDENT_AMBULATORY_CARE_PROVIDER_SITE_OTHER): Payer: Self-pay

## 2021-07-31 NOTE — Telephone Encounter (Signed)
Spoke with the patient and he is scheduled with Dr. Gilda Crease for a RLE angio on 08/07/21 with a 11:15 am arrival time to the [facility]. Pre-procedure instructions were discussed and will be mailed.

## 2021-08-05 ENCOUNTER — Encounter (INDEPENDENT_AMBULATORY_CARE_PROVIDER_SITE_OTHER): Payer: Self-pay

## 2021-08-06 ENCOUNTER — Other Ambulatory Visit (INDEPENDENT_AMBULATORY_CARE_PROVIDER_SITE_OTHER): Payer: Self-pay | Admitting: Nurse Practitioner

## 2021-08-06 ENCOUNTER — Encounter (INDEPENDENT_AMBULATORY_CARE_PROVIDER_SITE_OTHER): Payer: Self-pay

## 2021-08-06 MED ORDER — OXYCODONE-ACETAMINOPHEN 7.5-325 MG PO TABS: 1.0000 | ORAL_TABLET | Freq: Four times a day (QID) | ORAL | 0 refills | Status: DC | PRN

## 2021-08-06 NOTE — Telephone Encounter (Signed)
Pt called and needs  refill on oxycodone . They also mentioned some  having some pain in the LLE I called the pt and triaged him over the phone and per the call the pt denied having any discoloration, wounds, rest pain, or coldness  in the LLE the pain is in the bottom of the foot he described it as a stabbing pain. The pt said that he has  Drop foot.pt does not meet the above criteria to come or schedule a Test so most likely the pain is  from the Drop foot  Please advise.

## 2021-08-06 NOTE — Telephone Encounter (Signed)
The patient has already seen neurology.  They did the nerve test

## 2021-08-07 ENCOUNTER — Other Ambulatory Visit (INDEPENDENT_AMBULATORY_CARE_PROVIDER_SITE_OTHER): Payer: Self-pay | Admitting: Nurse Practitioner

## 2021-08-07 ENCOUNTER — Encounter
Admission: RE | Disposition: A | Discharge status: Home / Self Care | Payer: Self-pay | Source: Home / Self Care | Attending: Vascular Surgery

## 2021-08-07 ENCOUNTER — Other Ambulatory Visit: Payer: Self-pay

## 2021-08-07 ENCOUNTER — Encounter: Payer: Self-pay | Admitting: Vascular Surgery

## 2021-08-07 ENCOUNTER — Ambulatory Visit
Admission: RE | Admit: 2021-08-07 | Discharge: 2021-08-07 | Disposition: A | Discharge status: Home / Self Care | Payer: Medicaid Other | Attending: Vascular Surgery | Admitting: Vascular Surgery

## 2021-08-07 DIAGNOSIS — L97909 Non-pressure chronic ulcer of unspecified part of unspecified lower leg with unspecified severity: Secondary | ICD-10-CM

## 2021-08-07 DIAGNOSIS — F17210 Nicotine dependence, cigarettes, uncomplicated: Secondary | ICD-10-CM | POA: Insufficient documentation

## 2021-08-07 DIAGNOSIS — I70235 Atherosclerosis of native arteries of right leg with ulceration of other part of foot: Secondary | ICD-10-CM | POA: Diagnosis present

## 2021-08-07 DIAGNOSIS — I10 Essential (primary) hypertension: Secondary | ICD-10-CM | POA: Insufficient documentation

## 2021-08-07 DIAGNOSIS — E782 Mixed hyperlipidemia: Secondary | ICD-10-CM | POA: Insufficient documentation

## 2021-08-07 DIAGNOSIS — I70223 Atherosclerosis of native arteries of extremities with rest pain, bilateral legs: Secondary | ICD-10-CM

## 2021-08-07 DIAGNOSIS — I70221 Atherosclerosis of native arteries of extremities with rest pain, right leg: Secondary | ICD-10-CM | POA: Diagnosis not present

## 2021-08-07 DIAGNOSIS — L97519 Non-pressure chronic ulcer of other part of right foot with unspecified severity: Secondary | ICD-10-CM | POA: Insufficient documentation

## 2021-08-07 DIAGNOSIS — I70299 Other atherosclerosis of native arteries of extremities, unspecified extremity: Secondary | ICD-10-CM

## 2021-08-07 HISTORY — PX: LOWER EXTREMITY ANGIOGRAPHY: CATH118251

## 2021-08-07 LAB — BUN: BUN: 12 mg/dL (ref 6–20)

## 2021-08-07 LAB — CREATININE, SERUM
Creatinine, Ser: 0.72 mg/dL (ref 0.61–1.24)
GFR, Estimated: >60 mL/min (ref >60)

## 2021-08-07 SURGERY — LOWER EXTREMITY ANGIOGRAPHY
Anesthesia: Moderate Sedation | Site: Leg Lower | Laterality: Right

## 2021-08-07 MED ORDER — ONDANSETRON HCL 4 MG/2ML IJ SOLN: 4.0000 mg | Freq: Four times a day (QID) | INTRAMUSCULAR | Status: DC | PRN

## 2021-08-07 MED ORDER — IODIXANOL 320 MG/ML IV SOLN
INTRAVENOUS | Status: DC | PRN
  Administered 2021-08-07: 45 mL via INTRA_ARTERIAL

## 2021-08-07 MED ORDER — CEFAZOLIN SODIUM-DEXTROSE 2-4 GM/100ML-% IV SOLN
INTRAVENOUS | Status: AC
  Administered 2021-08-07: 2 g via INTRAVENOUS
  Filled 2021-08-07: qty 100

## 2021-08-07 MED ORDER — FAMOTIDINE 20 MG PO TABS: 40.0000 mg | ORAL_TABLET | Freq: Once | ORAL | Status: DC | PRN

## 2021-08-07 MED ORDER — MIDAZOLAM HCL 2 MG/2ML IJ SOLN
INTRAMUSCULAR | Status: AC
  Filled 2021-08-07: qty 2

## 2021-08-07 MED ORDER — SODIUM CHLORIDE 0.9 % IV SOLN: INTRAVENOUS | Status: DC

## 2021-08-07 MED ORDER — MIDAZOLAM HCL 2 MG/ML PO SYRP: 8.0000 mg | ORAL_SOLUTION | Freq: Once | ORAL | Status: DC | PRN

## 2021-08-07 MED ORDER — SODIUM CHLORIDE 0.9% FLUSH: 3.0000 mL | Freq: Two times a day (BID) | INTRAVENOUS | Status: DC

## 2021-08-07 MED ORDER — HEPARIN SODIUM (PORCINE) 1000 UNIT/ML IJ SOLN
INTRAMUSCULAR | Status: AC
  Filled 2021-08-07: qty 10

## 2021-08-07 MED ORDER — HEPARIN SODIUM (PORCINE) 1000 UNIT/ML IJ SOLN
INTRAMUSCULAR | Status: DC | PRN
  Administered 2021-08-07: 5000 [IU] via INTRAVENOUS

## 2021-08-07 MED ORDER — ACETAMINOPHEN 325 MG PO TABS: 650.0000 mg | ORAL_TABLET | ORAL | Status: DC | PRN

## 2021-08-07 MED ORDER — OXYCODONE HCL 5 MG PO TABS: 5.0000 mg | ORAL_TABLET | ORAL | Status: DC | PRN

## 2021-08-07 MED ORDER — FENTANYL CITRATE (PF) 100 MCG/2ML IJ SOLN
INTRAMUSCULAR | Status: AC
  Filled 2021-08-07: qty 2

## 2021-08-07 MED ORDER — FENTANYL CITRATE PF 50 MCG/ML IJ SOSY
PREFILLED_SYRINGE | INTRAMUSCULAR | Status: AC
  Filled 2021-08-07: qty 1

## 2021-08-07 MED ORDER — MIDAZOLAM HCL 5 MG/5ML IJ SOLN
INTRAMUSCULAR | Status: AC
  Filled 2021-08-07: qty 5

## 2021-08-07 MED ORDER — METHYLPREDNISOLONE SODIUM SUCC 125 MG IJ SOLR: 125.0000 mg | Freq: Once | INTRAMUSCULAR | Status: DC | PRN

## 2021-08-07 MED ORDER — HYDROMORPHONE HCL 1 MG/ML IJ SOLN: 1.0000 mg | Freq: Once | INTRAMUSCULAR | Status: DC | PRN

## 2021-08-07 MED ORDER — LABETALOL HCL 5 MG/ML IV SOLN: 10.0000 mg | INTRAVENOUS | Status: DC | PRN

## 2021-08-07 MED ORDER — MIDAZOLAM HCL 2 MG/2ML IJ SOLN
INTRAMUSCULAR | Status: DC | PRN
  Administered 2021-08-07 (×5): 1 mg via INTRAVENOUS

## 2021-08-07 MED ORDER — MORPHINE SULFATE (PF) 4 MG/ML IV SOLN: 2.0000 mg | INTRAVENOUS | Status: DC | PRN

## 2021-08-07 MED ORDER — CEFAZOLIN SODIUM-DEXTROSE 2-4 GM/100ML-% IV SOLN: 2.0000 g | Freq: Once | INTRAVENOUS | Status: AC

## 2021-08-07 MED ORDER — FENTANYL CITRATE (PF) 100 MCG/2ML IJ SOLN
INTRAMUSCULAR | Status: DC | PRN
  Administered 2021-08-07 (×3): 50 ug via INTRAVENOUS

## 2021-08-07 MED ORDER — SODIUM CHLORIDE 0.9 % IV SOLN: 250.0000 mL | INTRAVENOUS | Status: DC | PRN

## 2021-08-07 MED ORDER — SODIUM CHLORIDE 0.9% FLUSH: 3.0000 mL | INTRAVENOUS | Status: DC | PRN

## 2021-08-07 MED ORDER — HYDRALAZINE HCL 20 MG/ML IJ SOLN: 5.0000 mg | INTRAMUSCULAR | Status: DC | PRN

## 2021-08-07 MED ORDER — DIPHENHYDRAMINE HCL 50 MG/ML IJ SOLN: 50.0000 mg | Freq: Once | INTRAMUSCULAR | Status: DC | PRN

## 2021-08-07 SURGICAL SUPPLY — 20 items
BALLN LUTONIX 018 5X60X130 (BALLOONS) ×2
BALLN LUTONIX DCB 5X60X130 (BALLOONS) ×2
BALLOON LUTONIX 018 5X60X130 (BALLOONS) IMPLANT
BALLOON LUTONIX DCB 5X60X130 (BALLOONS) IMPLANT
CANNULA 5F STIFF (CANNULA) ×1 IMPLANT
CATH ANGIO 5F PIGTAIL 65CM (CATHETERS) ×1 IMPLANT
COVER PROBE U/S 5X48 (MISCELLANEOUS) ×1 IMPLANT
DEVICE STARCLOSE SE CLOSURE (Vascular Products) ×1 IMPLANT
GUIDEWIRE SUPER STIFF .035X180 (WIRE) ×1 IMPLANT
KIT ENCORE 26 ADVANTAGE (KITS) ×1 IMPLANT
PACK ANGIOGRAPHY (CUSTOM PROCEDURE TRAY) ×2 IMPLANT
SHEATH BRITE TIP 5FRX11 (SHEATH) ×1 IMPLANT
SHEATH BRITE TIP 6FRX11 (SHEATH) ×2 IMPLANT
SHEATH BRITE TIP 7FRX11 (SHEATH) ×1 IMPLANT
SHIELD X-DRAPE GOLD 12X17 (MISCELLANEOUS) ×1 IMPLANT
STENT LIFESTENT 5F 6X60X135 (Permanent Stent) ×1 IMPLANT
SYR MEDRAD MARK 7 150ML (SYRINGE) ×1 IMPLANT
TUBING CONTRAST HIGH PRESS 72 (TUBING) ×1 IMPLANT
WIRE G 018X200 V18 (WIRE) ×1 IMPLANT
WIRE GUIDERIGHT .035X150 (WIRE) ×1 IMPLANT

## 2021-08-07 NOTE — H&P (Signed)
@LOGO @   MRN : [id number]  Jeffrey Grant. is a 41 y.o. (January 11, 1981) male who presents with chief complaint of foot hurts.  History of Present Illness:   Procedure 06/01/2021: 1.         Aortobifemoral bypass grafting with 12 x 6 bifurcated dacryon graft 2.   Right common femoral endarterectomy 2.         Right superficial femoral endarterectomy 3.         Left common femoral endarterectomy 4.         Left superficial femoral endarterectomy   He had a subsequent intervention on 06/02/2021 including, for ischemia following his surgery:   Procedure:   Right superficial femoral endarterectomy with CorMatrix patch angioplasty.   Initially after surgery the patient noted no pain and being able to walk well however recently he began to have severe pain in his feet.  Initially it was to be related to nerve pain and issues however he notes that the pain is worse when he is sleeping and it gets better when he gets up in the morning.  Subsequently his pain has been consistent no matter what position his legs are in.    Procedure 07/24/2021: Angioplasty and stent placement left SFA and popliteal artery.  The patient had wounds on his feet that have healed however there is an ulceration on his left lower extremity that has continued to be slow to heal.  Currently the patient notes that he has no sensation in his right lower extremity but has full motor function.  The left lower extremity has sensation but has decreased motor function.  Initially his pulses were palpable post intervention however today they are not palpable and the toes are cool.  This was also present in his left lower extremity prior to his most recent interventions.  He notes that the pain in his feet is intense with burning and stinging and is almost intolerable for him.  The wounds following his intervention have healed well with no evidence of dehiscence.    Previous noninvasive studies show an ABI of 0.98 on the right and 0.75 on  the left.  He has a TBI of 0.64 on the right and 0.45 on the left.  Right lower extremity shows mixed waveforms throughout with monophasic waveforms being near the distal bypass graft and common femoral artery with mixed biphasic/hyperemic waveforms in the right.  The left lower extremity has notable monophasic/hyperemic waveforms throughout with a noted significant 50 to 74% stenosis in the distal SFA.  The toe waveforms bilaterally are severely dampened and nearly flat.  Current Meds  Medication Sig   acetaminophen (TYLENOL) 500 MG tablet Take 1,000 mg by mouth every 6 (six) hours as needed (pain.).   amitriptyline (ELAVIL) 50 MG tablet Take 50 mg by mouth at bedtime.   amLODipine (NORVASC) 10 MG tablet Take 0.5 tablets (5 mg total) by mouth in the morning. (Patient taking differently: Take 10 mg by mouth in the morning.)   aspirin EC 81 MG EC tablet Take 1 tablet (81 mg total) by mouth daily. Swallow whole.   atorvastatin (LIPITOR) 80 MG tablet Take 1 tablet (80 mg total) by mouth daily.   clopidogrel (PLAVIX) 75 MG tablet Take 1 tablet (75 mg total) by mouth daily.   lisinopril (ZESTRIL) 20 MG tablet Take 20 mg by mouth daily.   methocarbamol (ROBAXIN) 500 MG tablet Take 1 tablet (500 mg total) by mouth every 6 (six) hours as needed for muscle spasms.  oxyCODONE-acetaminophen (PERCOCET) 7.5-325 MG tablet Take 1 tablet by mouth every 6 (six) hours as needed for severe pain.   Vitamin D, Ergocalciferol, (DRISDOL) 50000 units CAPS capsule Take 50,000 Units by mouth every Thursday.    Past Medical History:  Diagnosis Date   Anxiety    Atherosclerotic PVD with intermittent claudication (HCC)    Back pain    Collagen vascular disease (HCC)    Dyspnea    Dysrhythmia    Elevated lipids    Hypertension    Nausea    Seropositive rheumatoid arthritis ([place])     Past Surgical History:  Procedure Laterality Date   AORTA - BILATERAL FEMORAL ARTERY BYPASS GRAFT Bilateral 06/01/2021   Procedure:  AORTA BIFEMORAL BYPASS GRAFT;  Surgeon: Katha Cabal, MD;  Location: [facility] ORS;  Service: Vascular;  Laterality: Bilateral;   COLONOSCOPY WITH PROPOFOL N/A 04/21/2018   Procedure: COLONOSCOPY WITH PROPOFOL;  Surgeon: Jonathon Bellows, MD;  Location[facility] ENDOSCOPY;  Service: Gastroenterology;  Laterality: N/A;   ENDARTERECTOMY POPLITEAL Right 06/02/2021   Procedure: Right Femoral Endarterectomy and Patching Endoplasty;  Surgeon: Serafina Mitchell, MD;  Location: [facility] ORS;  Service: Vascular;  Laterality: Right;   ESOPHAGOGASTRODUODENOSCOPY (EGD) WITH PROPOFOL N/A 04/21/2018   Procedure: ESOPHAGOGASTRODUODENOSCOPY (EGD) WITH PROPOFOL;  Surgeon: Jonathon Bellows, MD;  Location: [facility] ENDOSCOPY;  Service: Gastroenterology;  Laterality: N/A;   HIP PINNING Right    LOWER EXTREMITY ANGIOGRAPHY N/A 05/29/2021   Procedure: LOWER EXTREMITY ANGIOGRAPHY;  Surgeon: Katha Cabal, MD;  Location: [place] [facility];  Service: Cardiovascular;  Laterality: N/A;   LOWER EXTREMITY ANGIOGRAPHY Left 07/24/2021   Procedure: LOWER EXTREMITY ANGIOGRAPHY;  Surgeon: Katha Cabal, MD;  Location: [place] [facility];  Service: Cardiovascular;  Laterality: Left;   TOTAL HIP ARTHROPLASTY Right 10/22/2016   Procedure: TOTAL HIP ARTHROPLASTY ANTERIOR APPROACH;  Surgeon: Hessie Knows, MD;  Location: [facility] ORS;  Service: Orthopedics;  Laterality: Right;    Social History Social History   Tobacco Use   Smoking status: Former    Packs/day: 0.25    Years: 16.00    Pack years: 4.00    Types: Cigarettes    Quit date: 06/04/2021    Years since quitting: 0.1   Smokeless tobacco: Never  Vaping Use   Vaping Use: Never used  Substance Use Topics   Alcohol use: No   Drug use: Not Currently    Family History History reviewed. No pertinent family history.  No Known Allergies   REVIEW OF SYSTEMS (Negative unless checked)  Constitutional: [] Weight loss  [] Fever  [] Chills Cardiac: [] Chest pain   [] Chest pressure    [] Palpitations   [] Shortness of breath when laying flat   [] Shortness of breath with exertion. Vascular:  [] Pain in legs with walking   [] Pain in legs at rest  [] History of DVT   [] Phlebitis   [] Swelling in legs   [] Varicose veins   [] Non-healing ulcers Pulmonary:   [] Uses home oxygen   [] Productive cough   [] Hemoptysis   [] Wheeze  [] COPD   [] Asthma Neurologic:  [] Dizziness   [] Seizures   [] History of stroke   [] History of TIA  [] Aphasia   [] Vissual changes   [] Weakness or numbness in arm   [] Weakness or numbness in leg Musculoskeletal:   [] Joint swelling   [] Joint pain   [] Low back pain Hematologic:  [] Easy bruising  [] Easy bleeding   [] Hypercoagulable state   [] Anemic Gastrointestinal:  [] Diarrhea   [] Vomiting  [] Gastroesophageal reflux/heartburn   [] Difficulty swallowing. Genitourinary:  []   Chronic kidney disease   [] Difficult urination  [] Frequent urination   [] Blood in urine Skin:  [] Rashes   [] Ulcers  Psychological:  [] History of anxiety   []  History of major depression.  Physical Examination  Vitals:   08/07/21 1122  BP: 110/75  Pulse: (!) 111  Resp: 19  Temp: 97.6 F (36.4 C)  TempSrc: Oral  SpO2: 100%  Weight: 68 kg  Height: 5\' 8"  (1.727 m)   Body mass index is 22.81 kg/m. Gen: WD/WN, NAD Head: [place]/AT, No temporalis wasting.  Ear/Nose/Throat: Hearing grossly intact, nares w/o erythema or drainage Eyes: PER, EOMI, sclera nonicteric.  Neck: Supple, no masses.  No bruit or JVD.  Pulmonary:  Good air movement, no audible wheezing, no use of accessory muscles.  Cardiac: RRR, normal S1, S2, no Murmurs. Vascular:   Vessel Right Left  Radial Palpable Palpable  PT Not Palpable Palpable  DP Not Palpable Palpable  Gastrointestinal: soft, non-distended. No guarding/no peritoneal signs.  Musculoskeletal: M/S 5/5 throughout.  No visible deformity.  Neurologic: CN 2-12 intact. Pain and light touch intact in extremities.  Symmetrical.  Speech is fluent. Motor exam as listed  above. Psychiatric: Judgment intact, Mood & affect appropriate for pt's clinical situation. Dermatologic: No rashes or ulcers noted.  No changes consistent with cellulitis.   CBC Lab Results  Component Value Date   WBC 10.2 07/12/2021   HGB 11.4 (L) 07/12/2021   HCT 35.1 (L) 07/12/2021   MCV 90.9 07/12/2021   PLT 387 07/12/2021    BMET    Component Value Date/Time   NA 137 07/12/2021 0856   NA 140 03/30/2018 1404   K 4.2 07/12/2021 0856   CL 97 (L) 07/12/2021 0856   CO2 28 07/12/2021 0856   GLUCOSE 185 (H) 07/12/2021 0856   BUN 19 07/24/2021 0745   BUN 9 03/30/2018 1404   CREATININE 1.22 07/24/2021 0745   CALCIUM 10.0 07/12/2021 0856   GFRNONAA >60 07/24/2021 0745   GFRAA 129 03/30/2018 1404   Estimated Creatinine Clearance: 76.6 mL/min (by C-G formula based on SCr of 1.22 mg/dL).  COAG Lab Results  Component Value Date   INR 0.9 06/01/2021   INR 1.0 05/29/2021   INR 0.91 10/09/2016    Radiology DG Chest 2 View  Result Date: 07/12/2021 CLINICAL DATA:  Chest pain. EXAM: CHEST - 2 VIEW COMPARISON:  Chest radiograph 12/25/2005 FINDINGS: The cardiomediastinal silhouette is within normal limits. The lungs are well inflated. No airspace consolidation, edema, pleural effusion, pneumothorax is identified. No acute osseous abnormality is seen. IMPRESSION: No active cardiopulmonary disease. Electronically Signed   By: Logan Bores M.D.   On: 07/12/2021 09:33   CT ANGIO AO+BIFEM W & OR WO CONTRAST  Result Date: 07/12/2021 CLINICAL DATA:  Recent aortobifemoral bypass, now with worsening pain and tachycardic. Evaluate stenosis or leak; Recent aortobifemoral bypass, now with worsening chest and abdominal pain, tachycardic. EXAM: CT ANGIOGRAPHY OF ABDOMINAL AORTA WITH ILIOFEMORAL RUNOFF TECHNIQUE: Multidetector CT imaging of the abdomen, pelvis and lower extremities was performed using the standard protocol during bolus administration of intravenous contrast. Multiplanar CT image  reconstructions and MIPs were obtained to evaluate the vascular anatomy. RADIATION DOSE REDUCTION: This exam was performed according to the departmental dose-optimization program which includes automated exposure control, adjustment of the mA and/or kV according to patient size and/or use of iterative reconstruction technique. CONTRAST:  130mL OMNIPAQUE IOHEXOL 350 MG/ML SOLN COMPARISON:  December 2022 FINDINGS: CTA chest Cardiovascular: Preferential opacification of the thoracic aorta. No evidence  of thoracic aortic aneurysm or dissection. Scattered fibrofatty plaque. The pulmonary arteries are normal in caliber without central filling defect. Normal heart size. No pericardial effusion. Mediastinum/Nodes: No enlarged mediastinal, hilar, or axillary lymph nodes. The thyroid gland appears normal. Lungs/Pleura: No pleural effusion. No pneumothorax. No mass or focal consolidation. No suspicious pulmonary nodules. CTA abdomen pelvis VASCULAR Abdominal Aorta: The patient is status post infrarenal aortobifemoral bypass graft. The graft material is widely patent without complicating feature. 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 Celiac: Patent without significant stenosis. SMA: Patent without significant stenosis. IMA: Occluded proximally.  Reconstitutes distally. Right Renal artery: Patent without significant stenosis. Left Renal artery: Patent without significant stenosis. Right lower extremity Common iliac artery: Occluded status post bypass graft. Internal iliac artery: Occluded status post bypass graft. There is reconstitution of the distal branches. External iliac artery: Occluded status post bypass graft. Common femoral artery: Patent distal to the bypass graft anastomosis. Profunda femoral artery: Patent without significant stenosis. Superficial femoral artery: Scattered atherosclerotic disease  results in multifocal up to moderate stenoses, appears most advanced in the distal SFA near the adductor canal. Popliteal artery: Atherosclerotic disease results in multifocal stenoses, appears moderate to severe in the distal popliteal artery. Tibioperoneal trunk: Atherosclerotic disease results in severe to nearly occlusive stenoses. Runoff vessels: The anterior tibial artery is occluded. There is a 2 vessel runoff via the peroneal and posterior tibial arteries. Suspect atherosclerotic stenoses at the origin of these vessels. Left lower extremity Common iliac artery: Occluded status post bypass graft. Internal iliac artery: Occluded status post bypass graft. There is reconstitution of the distal branches. External iliac artery: Occluded status post bypass graft. Common femoral artery: Patent distal to the bypass graft anastomosis. Profunda femoral artery: Patent without significant stenosis. Superficial femoral artery: Scattered atherosclerotic disease results in multifocal severe stenosis, most pronounced in the proximal SFA near its origin and in the distal SFA at the adductor canal. Popliteal artery: Patent without significant stenosis or aneurysm. Tibioperoneal trunk: Patent without significant stenosis. Runoff vessels: The anterior tibial artery is occluded. There is a 2 vessel runoff via the peroneal and posterior tibial arteries. NON-VASCULAR Hepatobiliary: The liver is normal in size without focal abnormality. No intrahepatic or extrahepatic biliary ductal dilation. The gallbladder appears normal. Spleen: Normal in size without focal abnormality. Pancreas: No pancreatic ductal dilatation or surrounding inflammatory changes. Adrenals/Urinary Tract: Adrenal glands are unremarkable. Bilateral simple renal cysts, stable in appearance. Otherwise normal size and appearance of the kidneys without hydronephrosis. Bladder is unremarkable. Stomach/Bowel: The stomach, small bowel and large bowel are normal in caliber  without abnormal wall thickening or surrounding inflammatory changes. The appendix is normal. Reproductive: Prostate is unremarkable. Lymphatic: No enlarged lymph nodes in the abdomen or pelvis. Other: No abdominopelvic ascites. Musculoskeletal: Right hip arthroplasty. Postsurgical changes of the bilateral groins and midline abdomen, no complicating feature or fluid collection. IMPRESSION: VASCULAR 1. Status post aortobifemoral bypass graft without complicating feature. The bypass graft remains widely patent. No findings of pseudoaneurysm or dissection. 2. In the right lower extremity, scattered atherosclerotic results in moderate and severe stenoses of the SFA, popliteal artery and tibioperoneal trunk as described. There is a 2 vessel runoff via the posterior tibial and peroneal arteries. 3. In the left lower extremity, scattered atherosclerotic disease results in multifocal severe stenoses of the SFA as described. There is a 2 vessel runoff via the posterior tibial and peroneal arteries. NON-VASCULAR 1. No  acute findings in the chest, abdomen or pelvis. Electronically Signed   By: Albin Felling M.D.   On: 07/12/2021 11:51   PERIPHERAL VASCULAR CATHETERIZATION  Result Date: 07/24/2021 See surgical note for result.  CT ANGIO CHEST AORTA W/CM &/OR WO/CM  Result Date: 07/12/2021 CLINICAL DATA:  Recent aortobifemoral bypass, now with worsening pain and tachycardic. Evaluate stenosis or leak; Recent aortobifemoral bypass, now with worsening chest and abdominal pain, tachycardic. EXAM: CT ANGIOGRAPHY OF ABDOMINAL AORTA WITH ILIOFEMORAL RUNOFF TECHNIQUE: Multidetector CT imaging of the abdomen, pelvis and lower extremities was performed using the standard protocol during bolus administration of intravenous contrast. Multiplanar CT image reconstructions and MIPs were obtained to evaluate the vascular anatomy. RADIATION DOSE REDUCTION: This exam was performed according to the departmental dose-optimization program  which includes automated exposure control, adjustment of the mA and/or kV according to patient size and/or use of iterative reconstruction technique. CONTRAST:  119mL OMNIPAQUE IOHEXOL 350 MG/ML SOLN COMPARISON:  December 2022 FINDINGS: CTA chest Cardiovascular: Preferential opacification of the thoracic aorta. No evidence of thoracic aortic aneurysm or dissection. Scattered fibrofatty plaque. The pulmonary arteries are normal in caliber without central filling defect. Normal heart size. No pericardial effusion. Mediastinum/Nodes: No enlarged mediastinal, hilar, or axillary lymph nodes. The thyroid gland appears normal. Lungs/Pleura: No pleural effusion. No pneumothorax. No mass or focal consolidation. No suspicious pulmonary nodules. CTA abdomen pelvis VASCULAR Abdominal Aorta: The patient is status post infrarenal aortobifemoral bypass graft. The graft material is widely patent without complicating feature. 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 Celiac: Patent without significant stenosis. SMA: Patent without significant stenosis. IMA: Occluded proximally.  Reconstitutes distally. Right Renal artery: Patent without significant stenosis. Left Renal artery: Patent without significant stenosis. Right lower extremity Common iliac artery: Occluded status post bypass graft. Internal iliac artery: Occluded status post bypass graft. There is reconstitution of the distal branches. External iliac artery: Occluded status post bypass graft. Common femoral artery: Patent distal to the bypass graft anastomosis. Profunda femoral artery: Patent without significant stenosis. Superficial femoral artery: Scattered atherosclerotic disease results in multifocal up to moderate stenoses, appears most advanced in the distal SFA near the adductor canal. Popliteal artery: Atherosclerotic disease results in multifocal  stenoses, appears moderate to severe in the distal popliteal artery. Tibioperoneal trunk: Atherosclerotic disease results in severe to nearly occlusive stenoses. Runoff vessels: The anterior tibial artery is occluded. There is a 2 vessel runoff via the peroneal and posterior tibial arteries. Suspect atherosclerotic stenoses at the origin of these vessels. Left lower extremity Common iliac artery: Occluded status post bypass graft. Internal iliac artery: Occluded status post bypass graft. There is reconstitution of the distal branches. External iliac artery: Occluded status post bypass graft. Common femoral artery: Patent distal to the bypass graft anastomosis. Profunda femoral artery: Patent without significant stenosis. Superficial femoral artery: Scattered atherosclerotic disease results in multifocal severe stenosis, most pronounced in the proximal SFA near its origin and in the distal SFA at the adductor canal. Popliteal artery: Patent without significant stenosis or aneurysm. Tibioperoneal trunk: Patent without significant stenosis. Runoff vessels: The anterior tibial artery is occluded. There is a 2 vessel runoff via the peroneal and posterior tibial arteries. NON-VASCULAR Hepatobiliary: The liver is normal in size without focal abnormality. No intrahepatic or extrahepatic biliary ductal dilation. The gallbladder appears normal. Spleen: Normal in size without focal abnormality. Pancreas: No pancreatic ductal dilatation or surrounding inflammatory changes. Adrenals/Urinary Tract: Adrenal glands  are unremarkable. Bilateral simple renal cysts, stable in appearance. Otherwise normal size and appearance of the kidneys without hydronephrosis. Bladder is unremarkable. Stomach/Bowel: The stomach, small bowel and large bowel are normal in caliber without abnormal wall thickening or surrounding inflammatory changes. The appendix is normal. Reproductive: Prostate is unremarkable. Lymphatic: No enlarged lymph nodes in the  abdomen or pelvis. Other: No abdominopelvic ascites. Musculoskeletal: Right hip arthroplasty. Postsurgical changes of the bilateral groins and midline abdomen, no complicating feature or fluid collection. IMPRESSION: VASCULAR 1. Status post aortobifemoral bypass graft without complicating feature. The bypass graft remains widely patent. No findings of pseudoaneurysm or dissection. 2. In the right lower extremity, scattered atherosclerotic results in moderate and severe stenoses of the SFA, popliteal artery and tibioperoneal trunk as described. There is a 2 vessel runoff via the posterior tibial and peroneal arteries. 3. In the left lower extremity, scattered atherosclerotic disease results in multifocal severe stenoses of the SFA as described. There is a 2 vessel runoff via the posterior tibial and peroneal arteries. NON-VASCULAR 1. No acute findings in the chest, abdomen or pelvis. Electronically Signed   By: Albin Felling M.D.   On: 07/12/2021 11:51     Assessment/Plan 1. 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HCC)  Recommend:   The patient has evidence of severe atherosclerotic changes of both lower extremities associated with ulceration and tissue loss of the right foot.  This represents a limb threatening ischemia and places the patient at the risk for right limb loss.   Patient should undergo right leg angiography of the lower extremities with the hope for intervention for limb salvage.  The risks and benefits as well as the alternative therapies was discussed in detail with the patient.  All questions were answered.  Patient agrees to proceed with right leg angiography.   The patient will follow up with me in the office after the procedure.     2. Benign essential hypertension Continue antihypertensive medications as already ordered, these medications have been reviewed and there are no changes at this time.    3. Current smoker Smoking cessation was discussed, 3-10 minutes  spent on this topic specifically.  Patient also advised how absolutely imperative it is at this time to stop smoking.   4. Mixed hyperlipidemia Continue statin as ordered and reviewed, no changes at this time    [name], MD  08/07/2021 12:06 PM

## 2021-08-07 NOTE — Op Note (Signed)
[facility]  Percutaneous Study/Intervention Procedural Note   Date of Surgery: 08/07/2021  Surgeon:  Katha Cabal, MD.  Pre-operative Diagnosis: Atherosclerotic occlusive disease bilateral lower extremities with persistent rest pain of the right lower extremity  Post-operative diagnosis:  Same  Procedure(s) Performed:             1.  Introduction catheter into right lower extremity first order catheter placement              2.    Contrast injection right lower extremity for distal runoff             3.  Percutaneous transluminal angioplasty and stent placement right superficial femoral artery.              4.  Star close closure right common femoral arteriotomy  Anesthesia: Conscious sedation was administered under my direct supervision by the interventional radiology RN. IV Versed plus fentanyl were utilized. Continuous ECG, pulse oximetry and blood pressure was monitored throughout the entire procedure.  Conscious sedation was for a total of 54 minutes.  Sheath: 6 French 11 cm Pinnacle sheath right common femoral antegrade  Contrast: 45 cc  Fluoroscopy Time: 4.4 minutes  Indications:  Jeffrey Grant. presents with persistent pain which he rates as severe 10 out of 10 in his right foot.  He recently underwent intervention on the left for similar symptoms following his aortobifemoral bypass graft and is now returning for similar treatment on the right.  The risks and benefits are reviewed all questions answered patient agrees to proceed.  Procedure:  Jeffrey Grant. is a 41 y.o. y.o. male who was identified and appropriate procedural time out was performed.  The patient was then placed supine on the table and prepped and draped in the usual sterile fashion.    Ultrasound was placed in the sterile sleeve and the right groin was evaluated the right common femoral artery was echolucent and pulsatile indicating patency.  Image was recorded for the  permanent record and under real-time visualization a microneedle was inserted into the common femoral artery microwire followed by a micro-sheath.  A J-wire was then advanced through the micro-sheath and a 6 French sheath was then inserted over a J-wire.  The wire was noted to be advanced down the profunda femoris and so a 0.018 V18 wire was then used in a buddy wire system and negotiated into the SFA under fluoroscopic guidance.  The initial wire was then removed dilator placed over the V18 wire and the sheath advanced so that the tip was now within the right SFA.  Distal runoff was then performed.  5000 units of heparin was then given and allowed to circulate.  A 5 mm x 60 mm Lutonix balloon was used to angioplasty the proximal superficial femoral artery. Inflation were to 10 atmospheres for 1 minute. Follow-up imaging demonstrated patency with greater than 50% residual stenosis.  Therefore a 6 mm x 60 mm life stent was deployed across this lesion and postdilated with a second 5 mm x 60 mm balloon inflated to 8 atm for approximately 1 minute.  Distal runoff was then reassessed and found to be unchanged.  After review of these images the sheath is pulled proximally and an oblique view of the common femoral is obtained and a Star close device deployed. There no immediate complications.   Findings:   The right common femoral is widely patent as is the profunda femoris.  The SFA does indeed  have a significant stenosis proximally essentially extending from the distal end of the endarterectomy for 40 mm or so.  This stenosis was approximately 70%.  The distal SFA and popliteal demonstrates mild disease and the trifurcation is heavily patent with two-vessel runoff to the foot.  Following angioplasty there is greater than 50% residual stenosis and therefore a life stent is deployed and postdilated with a 5 mm balloon.  The SFA is now patent with in-line flow and there is now less than 10% residual stenosis  with preservation of distal runoff.     Summary: Successful recanalization right lower extremity for limb salvage                        Disposition: Patient was taken to the recovery room in stable condition having tolerated the procedure well.  Katha Cabal 08/07/2021,5:04 PM

## 2021-08-07 NOTE — Interval H&P Note (Signed)
History and Physical Interval Note:  08/07/2021 12:14 PM  Jeffrey Grant.  has presented today for surgery, with the diagnosis of RLE Angio   BARD   ASO w ulceration.  The various methods of treatment have been discussed with the patient and family. After consideration of risks, benefits and other options for treatment, the patient has consented to  Procedure(s): Lower Extremity Angiography (Right) as a surgical intervention.  The patient's history has been reviewed, patient examined, no change in status, stable for surgery.  I have reviewed the patient's chart and labs.  Questions were answered to the patient's satisfaction.     [name]

## 2021-08-08 ENCOUNTER — Encounter: Payer: Self-pay | Admitting: Vascular Surgery

## 2021-08-09 ENCOUNTER — Telehealth (INDEPENDENT_AMBULATORY_CARE_PROVIDER_SITE_OTHER): Payer: Self-pay | Admitting: Vascular Surgery

## 2021-08-09 ENCOUNTER — Encounter (INDEPENDENT_AMBULATORY_CARE_PROVIDER_SITE_OTHER): Payer: Self-pay | Admitting: Vascular Surgery

## 2021-08-09 ENCOUNTER — Ambulatory Visit (INDEPENDENT_AMBULATORY_CARE_PROVIDER_SITE_OTHER): Payer: Medicaid Other | Admitting: Vascular Surgery

## 2021-08-09 NOTE — Telephone Encounter (Signed)
Jeffrey Grant called and left a message stating he got in the shower this morning and his incision starting bleeding. He wants to know what should he do. His surgery was 2/14 with Dr.  Gilda Crease.  He can be reached at [phone].

## 2021-08-09 NOTE — Telephone Encounter (Signed)
Called patient and he noted the bleeding was scant.  Patient advised to take gauze and hold pressure at area for 30 minutes and to lay flat for an hour.  He should then keep wound covered with banddaid.  Advised that if the bleeding becomes excessive or he develops swelling in the area he should go to ED.  Patient will follw up as scheduled.

## 2021-08-15 ENCOUNTER — Encounter (INDEPENDENT_AMBULATORY_CARE_PROVIDER_SITE_OTHER): Payer: Self-pay

## 2021-08-17 ENCOUNTER — Encounter (INDEPENDENT_AMBULATORY_CARE_PROVIDER_SITE_OTHER): Payer: Self-pay

## 2021-08-27 ENCOUNTER — Encounter (INDEPENDENT_AMBULATORY_CARE_PROVIDER_SITE_OTHER): Payer: Self-pay

## 2021-09-18 ENCOUNTER — Other Ambulatory Visit (INDEPENDENT_AMBULATORY_CARE_PROVIDER_SITE_OTHER): Payer: Self-pay | Admitting: Vascular Surgery

## 2021-09-18 DIAGNOSIS — Z9862 Peripheral vascular angioplasty status: Secondary | ICD-10-CM

## 2021-09-19 ENCOUNTER — Ambulatory Visit (INDEPENDENT_AMBULATORY_CARE_PROVIDER_SITE_OTHER): Payer: Medicaid Other | Admitting: Nurse Practitioner

## 2021-09-19 ENCOUNTER — Encounter (INDEPENDENT_AMBULATORY_CARE_PROVIDER_SITE_OTHER): Payer: Self-pay | Admitting: Nurse Practitioner

## 2021-09-19 ENCOUNTER — Ambulatory Visit (INDEPENDENT_AMBULATORY_CARE_PROVIDER_SITE_OTHER): Payer: Medicaid Other

## 2021-09-19 VITALS — BP 121/79 | HR 96 | Resp 17 | Ht 68.0 in | Wt 157.0 lb

## 2021-09-19 DIAGNOSIS — I10 Essential (primary) hypertension: Secondary | ICD-10-CM

## 2021-09-19 DIAGNOSIS — I70299 Other atherosclerosis of native arteries of extremities, unspecified extremity: Secondary | ICD-10-CM

## 2021-09-19 DIAGNOSIS — Z9862 Peripheral vascular angioplasty status: Secondary | ICD-10-CM

## 2021-09-19 DIAGNOSIS — L97909 Non-pressure chronic ulcer of unspecified part of unspecified lower leg with unspecified severity: Secondary | ICD-10-CM

## 2021-09-19 DIAGNOSIS — E782 Mixed hyperlipidemia: Secondary | ICD-10-CM | POA: Diagnosis not present

## 2021-09-19 MED ORDER — CILOSTAZOL 100 MG PO TABS: 100.0000 mg | ORAL_TABLET | Freq: Two times a day (BID) | ORAL | 3 refills | Status: DC

## 2021-10-01 ENCOUNTER — Encounter (INDEPENDENT_AMBULATORY_CARE_PROVIDER_SITE_OTHER): Payer: Self-pay | Admitting: Nurse Practitioner

## 2021-10-01 NOTE — Progress Notes (Signed)
? ?Subjective:  ? ? Patient ID: Jeffrey Holsomback., male    DOB: 03-Jun-1981, 41 y.o.   MRN: [id number] ?Chief Complaint  ?Patient presents with  ? Follow-up  ?  ultrasound  ? ? ?Right groin this is a 41 year old male that presents today for follow-up studies after intervention on his bilateral lower extremities.  The patient also previously underwent an aortobifem.  The patient notes that following the angiograms his feet felt much better however the right has been having on and off cool sensation.  He denies any significant rest pain.  He has been able to be more active following these interventions.  He has previously had worsening burning and stinging with the following interventions and has improved. ? ?Today noninvasive studies show an ABI of 1.03 on the right and 1.02 on the left.  Previously the ABI was 0.98 on the right and 0.75 on the left.  The patient has biphasic/triphasic tibial artery waveforms bilaterally with normal toe waveforms on the left but absent on the right. ? ? ? ?Review of Systems  ?Musculoskeletal:  Positive for gait problem.  ?All other systems reviewed and are negative. ? ?   ?Objective:  ? Physical Exam ?Vitals reviewed.  ?HENT:  ?   Head: Normocephalic.  ?Cardiovascular:  ?   Rate and Rhythm: Normal rate.  ?   Pulses:     ?     Dorsalis pedis pulses are 1+ on the left side.  ?     Posterior tibial pulses are 1+ on the left side.  ?Pulmonary:  ?   Effort: Pulmonary effort is normal.  ?Skin: ?   General: Skin is warm and dry.  ?Neurological:  ?   Mental Status: He is alert and oriented to person, place, and time.  ?   Gait: Gait abnormal.  ?Psychiatric:     ?   Mood and Affect: Mood normal.     ?   Behavior: Behavior normal.     ?   Thought Content: Thought content normal.     ?   Judgment: Judgment normal.  ? ? ?BP 121/79 (BP Location: Left Arm)   Pulse 96   Resp 17   Ht  (1.727 m)   Wt 157 lb (71.2 kg)   BMI 23.87 kg/m?  ? ?Past Medical History:  ?Diagnosis Date  ? Anxiety   ?  Atherosclerotic PVD with intermittent claudication (HCC)   ? Back pain   ? Collagen vascular disease (HCC)   ? Dyspnea   ? Dysrhythmia   ? Elevated lipids   ? Hypertension   ? Nausea   ? Seropositive rheumatoid arthritis (HCC)   ? ? ?Social History  ? ?Socioeconomic History  ? Marital status: Married  ?  Spouse name: Not on file  ? Number of children: Not on file  ? Years of education: Not on file  ? Highest education level: Not on file  ?Occupational History  ? Not on file  ?Tobacco Use  ? Smoking status: Former  ?  Packs/day: 0.25  ?  Years: 16.00  ?  Pack years: 4.00  ?  Types: Cigarettes  ?  Quit date: 06/04/2021  ?  Years since quitting: 0.3  ? Smokeless tobacco: Never  ?Vaping Use  ? Vaping Use: Never used  ?Substance and Sexual Activity  ? Alcohol use: No  ? Drug use: Not Currently  ? Sexual activity: Yes  ?  Partners: Female  ?Other Topics Concern  ? Not  on file  ?Social History Narrative  ? Not on file  ? ?Social Determinants of Health  ? ?Financial Resource Strain: Not on file  ?Food Insecurity: Not on file  ?Transportation Needs: Not on file  ?Physical Activity: Not on file  ?Stress: Not on file  ?Social Connections: Not on file  ?Intimate Partner Violence: Not on file  ? ? ?Past Surgical History:  ?Procedure Laterality Date  ? AORTA - BILATERAL FEMORAL ARTERY BYPASS GRAFT Bilateral 06/01/2021  ? Procedure: AORTA BIFEMORAL BYPASS GRAFT;  Surgeon: Renford Dills, MD;  Location: [facility] ORS;  Service: Vascular;  Laterality: Bilateral;  ? COLONOSCOPY WITH PROPOFOL N/A 04/21/2018  ? Procedure: COLONOSCOPY WITH PROPOFOL;  Surgeon: [name], MD;  Location: [facility] ENDOSCOPY;  Service: Gastroenterology;  Laterality: N/A;  ? ENDARTERECTOMY POPLITEAL Right 06/02/2021  ? Procedure: Right Femoral Endarterectomy and Patching Endoplasty;  Surgeon: Nada Libman, MD;  Location: [facility] ORS;  Service: Vascular;  Laterality: Right;  ? ESOPHAGOGASTRODUODENOSCOPY (EGD) WITH PROPOFOL N/A 04/21/2018  ? Procedure:  ESOPHAGOGASTRODUODENOSCOPY (EGD) WITH PROPOFOL;  Surgeon: [name], MD;  Location: [facility] ENDOSCOPY;  Service: Gastroenterology;  Laterality: N/A;  ? HIP PINNING Right   ? LOWER EXTREMITY ANGIOGRAPHY N/A 05/29/2021  ? Procedure: LOWER EXTREMITY ANGIOGRAPHY;  Surgeon: Renford Dills, MD;  Location: [facility] IN[facility];  Service: Cardiovascular;  Laterality: N/A;  ? LOWER EXTREMITY ANGIOGRAPHY Left 07/24/2021  ? Procedure: LOWER EXTREMITY ANGIOGRAPHY;  Surgeon: Renford Dills, MD;  Location: [facility];  Service: Cardiovascular;  Laterality: Left;  ? LOWER EXTREMITY ANGIOGRAPHY Right 08/07/2021  ? Procedure: Lower Extremity Angiography;  Surgeon: Renford Dills, MD;  Location: [facility];  Service: Cardiovascular;  Laterality: Right;  ? TOTAL HIP ARTHROPLASTY Right 10/22/2016  ? Procedure: TOTAL HIP ARTHROPLASTY ANTERIOR APPROACH;  Surgeon: Kennedy Bucker, MD;  Location: [facility] ORS;  Service: Orthopedics;  Laterality: Right;  ? ? ?History reviewed. No pertinent family history. ? ?No Known Allergies ? ? ?  Latest Ref Rng & Units 07/12/2021  ?  8:56 AM 06/05/2021  ?  2:13 AM 06/04/2021  ?  6:42 AM  ?CBC  ?WBC 4.0 - 10.5 K/uL 10.2   9.5   10.9    ?Hemoglobin 13.0 - 17.0 g/dL 40.9   8.4   8.4    ?Hematocrit 39.0 - 52.0 % 35.1   24.3   24.7    ?Platelets 150 - 400 K/uL 387   153   139    ? ? ? ? ?CMP  ?   ?Component Value Date/Time  ? NA 137 07/12/2021 0856  ? NA 140 03/30/2018 1404  ? K 4.2 07/12/2021 0856  ? CL 97 (L) 07/12/2021 0856  ? CO2 28 07/12/2021 0856  ? GLUCOSE 185 (H) 07/12/2021 0856  ? BUN 12 08/07/2021 1146  ? BUN 9 03/30/2018 1404  ? CREATININE 0.72 08/07/2021 1146  ? CALCIUM 10.0 07/12/2021 0856  ? PROT 7.3 03/30/2018 1404  ? ALBUMIN 4.6 03/30/2018 1404  ? AST 41 (H) 03/30/2018 1404  ? ALT 37 03/30/2018 1404  ? ALKPHOS 79 03/30/2018 1404  ? BILITOT 0.3 03/30/2018 1404  ? GFRNONAA >60 08/07/2021 1146  ? GFRAA 129 03/30/2018 1404  ? ? ? ?VAS [place] ABI WITH/WO TBI ? ?Result Date:  09/26/2021 ? LOWER EXTREMITY DOPPLER STUDY Patient Name:  Jeffrey Grant.  Date of Exam:   09/19/2021 Medical Rec #: [id number]          Accession #:  [id number] Date of Birth: 09/15/1980          Patient Gender: M Patient Age:   89 years Exam Location:  [facility] Procedure:      VAS US ABI WITH/WO TBI Referring Phys: Levora DredgeGregory Schnier --------------------------------------------------------------------------------  Indications: Peripheral artery disease.  Vascular Interventions: 06/01/21: Aortobifemoral BPG with bilateral CFA/SFA                         endarterectomies;                         06/02/21: Right SFA endarterectomy/angioplasty;                          07/24/2021: Left Lower Extremity Angiogram Third order                         catheter placement. Angioplasty and Stent placement Left                         SFA and Popliteal Artery.                          08/07/2021: PTA and Stent placement Right SFA. Comparison Study: 07/04/2021 Performing Technologist: Debbe BalesSolomon Mcclary RVS  Examination Guidelines: A complete evaluation includes at minimum, Doppler waveform signals and systolic blood pressure reading at the level of bilateral brachial, anterior tibial, and posterior tibial arteries, when vessel segments are accessible. Bilateral testing is considered an integral part of a complete examination. Photoelectric Plethysmograph (PPG) waveforms and toe systolic pressure readings are included as required and additional duplex testing as needed. Limited examinations for reoccurring indications may be performed as noted.  ABI Findings: +---------+------------------+-----+---------+--------+ Right    Rt Pressure (mmHg)IndexWaveform Comment  +---------+------------------+-----+---------+--------+ Brachial 129                                      +---------+------------------+-----+---------+--------+ ATA      101               0.78 biphasic           +---------+------------------+-----+---------+--------+ PTA      133               1.03 triphasic         +---------+------------------+-----+---------+--------+ PERO     123               0.95 triphasic         +---------+------------------+-----+---------+--------+ [place]

## 2021-10-08 ENCOUNTER — Encounter (INDEPENDENT_AMBULATORY_CARE_PROVIDER_SITE_OTHER): Payer: Self-pay

## 2021-10-19 ENCOUNTER — Ambulatory Visit (INDEPENDENT_AMBULATORY_CARE_PROVIDER_SITE_OTHER): Payer: Medicaid Other

## 2021-10-19 ENCOUNTER — Other Ambulatory Visit (INDEPENDENT_AMBULATORY_CARE_PROVIDER_SITE_OTHER): Payer: Self-pay | Admitting: Vascular Surgery

## 2021-10-19 ENCOUNTER — Encounter (INDEPENDENT_AMBULATORY_CARE_PROVIDER_SITE_OTHER): Payer: Self-pay | Admitting: Nurse Practitioner

## 2021-10-19 ENCOUNTER — Ambulatory Visit (INDEPENDENT_AMBULATORY_CARE_PROVIDER_SITE_OTHER): Payer: Medicaid Other | Admitting: Nurse Practitioner

## 2021-10-19 ENCOUNTER — Encounter (INDEPENDENT_AMBULATORY_CARE_PROVIDER_SITE_OTHER): Payer: Medicaid Other

## 2021-10-19 VITALS — BP 128/80 | HR 121 | Resp 16 | Ht 68.0 in | Wt 161.6 lb

## 2021-10-19 DIAGNOSIS — I10 Essential (primary) hypertension: Secondary | ICD-10-CM

## 2021-10-19 DIAGNOSIS — I70299 Other atherosclerosis of native arteries of extremities, unspecified extremity: Secondary | ICD-10-CM

## 2021-10-19 DIAGNOSIS — I739 Peripheral vascular disease, unspecified: Secondary | ICD-10-CM

## 2021-10-19 DIAGNOSIS — L97909 Non-pressure chronic ulcer of unspecified part of unspecified lower leg with unspecified severity: Secondary | ICD-10-CM | POA: Diagnosis not present

## 2021-10-19 DIAGNOSIS — Z9889 Other specified postprocedural states: Secondary | ICD-10-CM

## 2021-10-19 DIAGNOSIS — E782 Mixed hyperlipidemia: Secondary | ICD-10-CM

## 2021-10-19 MED ORDER — SULFAMETHOXAZOLE-TRIMETHOPRIM 800-160 MG PO TABS: 1.0000 | ORAL_TABLET | Freq: Two times a day (BID) | ORAL | 0 refills | Status: DC

## 2021-10-28 NOTE — Progress Notes (Signed)
? ?Subjective:  ? ? Patient ID: Breckon Reeves., male    DOB: 03/25/1981, 41 y.o.   MRN: [id number] ?No chief complaint on file. ? ? ?68 Gy is a 41 year old male that returns today a month after initiating Pletal in addition to his other antiplatelet therapy.  The patient notes that his feet feel warmer and there has been no development of wounds.  He still continues to suffer with neuropathic issues but it is improving. ? ?Today the patient's noninvasive studies have shown some improvement, notably in the TBI's.  Right TBI 0.84 with a left to 0.92.  The right TBI 0.78 and the left is 0.98.  The patient also underwent bilateral lower extremity arterial duplex examinations multiphasic waveforms throughout.  The left does have noted 50 to 74% stenosis in the distal SFA.  He has a patent aorta bifemoral bypass graft bilaterally. ? ? ?Review of Systems  ?Skin:  Negative for wound.  ?All other systems reviewed and are negative. ? ?   ?Objective:  ? Physical Exam ?Vitals reviewed.  ?HENT:  ?   Head: Normocephalic.  ?Cardiovascular:  ?   Rate and Rhythm: Normal rate.  ?   Pulses: Decreased pulses.  ?Pulmonary:  ?   Effort: Pulmonary effort is normal.  ?Skin: ?   General: Skin is warm and dry.  ?Neurological:  ?   Mental Status: He is alert and oriented to person, place, and time.  ?Psychiatric:     ?   Mood and Affect: Mood normal.     ?   Behavior: Behavior normal.     ?   Thought Content: Thought content normal.     ?   Judgment: Judgment normal.  ? ? ?BP 128/80 (BP Location: Right Arm)   Pulse (!) 121   Resp 16   Ht  (1.727 m)   Wt 161 lb 9.6 oz (73.3 kg)   BMI 24.57 kg/m?  ? ?Past Medical History:  ?Diagnosis Date  ? Anxiety   ? Atherosclerotic PVD with intermittent claudication (HCC)   ? Back pain   ? Collagen vascular disease (HCC)   ? Dyspnea   ? Dysrhythmia   ? Elevated lipids   ? Hypertension   ? Nausea   ? Seropositive rheumatoid arthritis (HCC)   ? ? ?Social History  ? ?Socioeconomic History  ?  Marital status: Married  ?  Spouse name: Not on file  ? Number of children: Not on file  ? Years of education: Not on file  ? Highest education level: Not on file  ?Occupational History  ? Not on file  ?Tobacco Use  ? Smoking status: Former  ?  Packs/day: 0.25  ?  Years: 16.00  ?  Pack years: 4.00  ?  Types: Cigarettes  ?  Quit date: 06/04/2021  ?  Years since quitting: 0.4  ? Smokeless tobacco: Never  ?Vaping Use  ? Vaping Use: Never used  ?Substance and Sexual Activity  ? Alcohol use: No  ? Drug use: Not Currently  ? Sexual activity: Yes  ?  Partners: Female  ?Other Topics Concern  ? Not on file  ?Social History Narrative  ? Not on file  ? ?Social Determinants of Health  ? ?Financial Resource Strain: Not on file  ?Food Insecurity: Not on file  ?Transportation Needs: Not on file  ?Physical Activity: Not on file  ?Stress: Not on file  ?Social Connections: Not on file  ?Intimate Partner Violence: Not on file  ? ? ?  Past Surgical History:  ?Procedure Laterality Date  ? AORTA - BILATERAL FEMORAL ARTERY BYPASS GRAFT Bilateral 06/01/2021  ? Procedure: AORTA BIFEMORAL BYPASS GRAFT;  Surgeon: Renford Dills, MD;  Location: [facility] ORS;  Service: Vascular;  Laterality: Bilateral;  ? COLONOSCOPY WITH PROPOFOL N/A 04/21/2018  ? Procedure: COLONOSCOPY WITH PROPOFOL;  Surgeon: [name], MD;  Location: [facility] ENDOSCOPY;  Service: Gastroenterology;  Laterality: N/A;  ? ENDARTERECTOMY POPLITEAL Right 06/02/2021  ? Procedure: Right Femoral Endarterectomy and Patching Endoplasty;  Surgeon: Nada Libman, MD;  Location: [facility] ORS;  Service: Vascular;  Laterality: Right;  ? ESOPHAGOGASTRODUODENOSCOPY (EGD) WITH PROPOFOL N/A 04/21/2018  ? Procedure: ESOPHAGOGASTRODUODENOSCOPY (EGD) WITH PROPOFOL;  Surgeon: [name], MD;  Location: [facility] ENDOSCOPY;  Service: Gastroenterology;  Laterality: N/A;  ? HIP PINNING Right   ? LOWER EXTREMITY ANGIOGRAPHY N/A 05/29/2021  ? Procedure: LOWER EXTREMITY ANGIOGRAPHY;  Surgeon: Renford Dills,  MD;  Location: [facility] IN[facility]IVE CV LAB;  Service: Cardiovascular;  Laterality: N/A;  ? LOWER EXTREMITY ANGIOGRAPHY Left 07/24/2021  ? Procedure: LOWER EXTREMITY ANGIOGRAPHY;  Surgeon: Renford Dills, MD;  Location: [facility];  Service: Cardiovascular;  Laterality: Left;  ? LOWER EXTREMITY ANGIOGRAPHY Right 08/07/2021  ? Procedure: Lower Extremity Angiography;  Surgeon: Renford Dills, MD;  Location: [facility];  Service: Cardiovascular;  Laterality: Right;  ? TOTAL HIP ARTHROPLASTY Right 10/22/2016  ? Procedure: TOTAL HIP ARTHROPLASTY ANTERIOR APPROACH;  Surgeon: Kennedy Bucker, MD;  Location: [facility] ORS;  Service: Orthopedics;  Laterality: Right;  ? ? ?History reviewed. No pertinent family history. ? ?No Known Allergies ? ? ?  Latest Ref Rng & Units 07/12/2021  ?  8:56 AM 06/05/2021  ?  2:13 AM 06/04/2021  ?  6:42 AM  ?CBC  ?WBC 4.0 - 10.5 K/uL 10.2   9.5   10.9    ?Hemoglobin 13.0 - 17.0 g/dL 98.1   8.4   8.4    ?Hematocrit 39.0 - 52.0 % 35.1   24.3   24.7    ?Platelets 150 - 400 K/uL 387   153   139    ? ? ? ? ?CMP  ?   ?Component Value Date/Time  ? NA 137 07/12/2021 0856  ? NA 140 03/30/2018 1404  ? K 4.2 07/12/2021 0856  ? CL 97 (L) 07/12/2021 0856  ? CO2 28 07/12/2021 0856  ? GLUCOSE 185 (H) 07/12/2021 0856  ? BUN 12 08/07/2021 1146  ? BUN 9 03/30/2018 1404  ? CREATININE 0.72 08/07/2021 1146  ? CALCIUM 10.0 07/12/2021 0856  ? PROT 7.3 03/30/2018 1404  ? ALBUMIN 4.6 03/30/2018 1404  ? AST 41 (H) 03/30/2018 1404  ? ALT 37 03/30/2018 1404  ? ALKPHOS 79 03/30/2018 1404  ? BILITOT 0.3 03/30/2018 1404  ? GFRNONAA >60 08/07/2021 1146  ? GFRAA 129 03/30/2018 1404  ? ? ? ?VAS [place] ABI WITH/WO TBI ? ?Result Date: 10/25/2021 ? LOWER EXTREMITY DOPPLER STUDY Patient Name:  Xayden Linsey  Date of Exam:   10/19/2021 Medical Rec #: [id number]      Accession #:    [id number] Date of Birth: 13-Dec-1980      Patient Gender: M Patient Age:   46 years Exam Location:  [facility] Procedure:      VAS [place] ABI  WITH/WO TBI Referring Phys: Festus Barren --------------------------------------------------------------------------------  Indications: Peripheral artery disease.  Vascular Interventions: 06/01/21: Aortobifemoral BPG with bilateral CFA/SFA  endarterectomies;                         06/02/21: Right SFA endarterectomy/angioplasty;                          07/24/2021: Left Lower Extremity Angiogram Third order                         catheter placement. Angioplasty and Stent placement Left                         SFA and Popliteal Artery.                          08/07/2021: PTA and Stent placement Right SFA. Comparison Study: 09/19/2021 Performing Technologist: Debbe BalesSolomon Mcclary RVS  Examination Guidelines: A complete evaluation includes at minimum, Doppler waveform signals and systolic blood pressure reading at the level of bilateral brachial, anterior tibial, and posterior tibial arteries, when vessel segments are accessible. Bilateral testing is considered an integral part of a complete examination. Photoelectric Plethysmograph (PPG) waveforms and toe systolic pressure readings are included as required and additional duplex testing as needed. Limited examinations for reoccurring indications may be performed as noted.  ABI Findings: +---------+------------------+-----+--------+--------+ Right    Rt Pressure (mmHg)IndexWaveformComment  +---------+------------------+-----+--------+--------+ Brachial 133                                     +---------+------------------+-----+--------+--------+ ATA      112               0.84 biphasic         +---------+------------------+-----+--------+--------+ PTA      121               0.91 biphasic         +---------+------------------+-----+--------+--------+ Great Toe104               0.78 Normal           +---------+------------------+-----+--------+--------+ +---------+------------------+-----+--------+-------+ Left     Lt  Pressure (mmHg)IndexWaveformComment +---------+------------------+-----+--------+-------+ Brachial 119                                    +---------+------------------+-----+--------+-------+ ATA      120               0.90 biphasic        +---------+--

## 2021-10-29 ENCOUNTER — Encounter (INDEPENDENT_AMBULATORY_CARE_PROVIDER_SITE_OTHER): Payer: Self-pay | Admitting: Nurse Practitioner

## 2021-12-13 ENCOUNTER — Telehealth (INDEPENDENT_AMBULATORY_CARE_PROVIDER_SITE_OTHER): Payer: Self-pay | Admitting: Nurse Practitioner

## 2021-12-13 NOTE — Telephone Encounter (Signed)
Called pt and made him aware of appt on Monday at 10:45 he stated verbal understanding.

## 2021-12-13 NOTE — Telephone Encounter (Signed)
I called Renita and spoke with her about this concern.  She states that the pt was seen and put on antibiotics by Sheppard Plumber NP and the place got better.  Since than for about a month the spot has opened back up and has an odor and drainage. Let her know I would speak with Vivia Birmingham and call her back.

## 2021-12-13 NOTE — Telephone Encounter (Signed)
Pt. Wife called and said the ares where surgery was done is not healing well and puss is coming out of it and it smells.  Pt. Would like to come in for an appointment earlier than July. Please advise.

## 2021-12-13 NOTE — Telephone Encounter (Signed)
Spoke with Vivia Birmingham and she states to bring the pt in on Monday to be seen.  I will have someone up front call to schedule that appt.

## 2021-12-17 ENCOUNTER — Ambulatory Visit (INDEPENDENT_AMBULATORY_CARE_PROVIDER_SITE_OTHER): Payer: Medicaid Other | Admitting: Nurse Practitioner

## 2021-12-20 ENCOUNTER — Ambulatory Visit (INDEPENDENT_AMBULATORY_CARE_PROVIDER_SITE_OTHER): Payer: Medicaid Other | Admitting: Nurse Practitioner

## 2022-01-20 NOTE — H&P (View-Only) (Signed)
MRN : [id number]  Jeffrey Grant. is a 41 y.o. (1981-02-29) male who presents with chief complaint of check circulation.  History of Present Illness:   The patient returns to the office for followup and review of the noninvasive studies.   The patient notes that there has been a significant deterioration in the lower extremity symptoms.  The patient notes interval shortening of their claudication distance and development of mild rest pain symptoms. No new ulcers or wounds have occurred since the last visit.  There have been no significant changes to the patient's overall health care.  The patient denies amaurosis fugax or recent TIA symptoms. There are no recent neurological changes noted. There is no history of DVT, PE or superficial thrombophlebitis. The patient denies recent episodes of angina or shortness of breath.   ABI Rt=0.70 and Lt=0.62  (previous ABI's Rt=0.91 and Lt=0.92) Duplex ultrasound of the bilateral lower extremities shows >60% stenosis in the distal right common femoral and monophasic flow through the right SFA stent.  On the left there is >70% stenosis in the distal left SFA stent   No outpatient medications have been marked as taking for the 01/21/22 encounter (Appointment) with Gilda Crease, Latina Craver, MD.    Past Medical History:  Diagnosis Date   Anxiety    Atherosclerotic PVD with intermittent claudication (HCC)    Back pain    Collagen vascular disease (HCC)    Dyspnea    Dysrhythmia    Elevated lipids    Hypertension    Nausea    Seropositive rheumatoid arthritis (HCC)     Past Surgical History:  Procedure Laterality Date   AORTA - BILATERAL FEMORAL ARTERY BYPASS GRAFT Bilateral 06/01/2021   Procedure: AORTA BIFEMORAL BYPASS GRAFT;  Surgeon: Renford Dills, MD;  Location: [facility] ORS;  Service: Vascular;  Laterality: Bilateral;   COLONOSCOPY WITH PROPOFOL N/A 04/21/2018   Procedure: COLONOSCOPY WITH PROPOFOL;  Surgeon: [name],  MD;  Location: [facility] ENDOSCOPY;  Service: Gastroenterology;  Laterality: N/A;   ENDARTERECTOMY POPLITEAL Right 06/02/2021   Procedure: Right Femoral Endarterectomy and Patching Endoplasty;  Surgeon: Nada Libman, MD;  Location: [facility] ORS;  Service: Vascular;  Laterality: Right;   ESOPHAGOGASTRODUODENOSCOPY (EGD) WITH PROPOFOL N/A 04/21/2018   Procedure: ESOPHAGOGASTRODUODENOSCOPY (EGD) WITH PROPOFOL;  Surgeon: [name], MD;  Location: [facility] ENDOSCOPY;  Service: Gastroenterology;  Laterality: N/A;   HIP PINNING Right    LOWER EXTREMITY ANGIOGRAPHY N/A 05/29/2021   Procedure: LOWER EXTREMITY ANGIOGRAPHY;  Surgeon: Renford Dills, MD;  Location: [facility] INVASIVE CV LAB;  Service: Cardiovascular;  Laterality: N/A;   LOWER EXTREMITY ANGIOGRAPHY Left 07/24/2021   Procedure: LOWER EXTREMITY ANGIOGRAPHY;  Surgeon: Renford Dills, MD;  Location: [facility] INVASIVE CV LAB;  Service: Cardiovascular;  Laterality: Left;   LOWER EXTREMITY ANGIOGRAPHY Right 08/07/2021   Procedure: Lower Extremity Angiography;  Surgeon: Renford Dills, MD;  Location: [facility] INVASIVE CV LAB;  Service: Cardiovascular;  Laterality: Right;   TOTAL HIP ARTHROPLASTY Right 10/22/2016   Procedure: TOTAL HIP ARTHROPLASTY ANTERIOR APPROACH;  Surgeon: Kennedy Bucker, MD;  Location: [facility] ORS;  Service: Orthopedics;  Laterality: Right;    Social History Social History   Tobacco Use   Smoking status: Former    Packs/day: 0.25    Years: 16.00    Total pack years: 4.00    Types: Cigarettes    Quit date: 06/04/2021    Years since quitting: 0.6  Smokeless tobacco: Never  Vaping Use   Vaping Use: Never used  Substance Use Topics   Alcohol use: No   Drug use: Not Currently    Family History No family history on file.  No Known Allergies   REVIEW OF SYSTEMS (Negative unless checked)  Constitutional: []Weight loss  []Fever  []Chills Cardiac: []Chest pain   []Chest pressure   []Palpitations   []Shortness of breath when laying  flat   []Shortness of breath with exertion. Vascular:  [x]Pain in legs with walking   []Pain in legs at rest  []History of DVT   []Phlebitis   []Swelling in legs   []Varicose veins   []Non-healing ulcers Pulmonary:   []Uses home oxygen   []Productive cough   []Hemoptysis   []Wheeze  []COPD   []Asthma Neurologic:  []Dizziness   []Seizures   []History of stroke   []History of TIA  []Aphasia   []Vissual changes   []Weakness or numbness in arm   []Weakness or numbness in leg Musculoskeletal:   []Joint swelling   []Joint pain   []Low back pain Hematologic:  []Easy bruising  []Easy bleeding   []Hypercoagulable state   []Anemic Gastrointestinal:  []Diarrhea   []Vomiting  []Gastroesophageal reflux/heartburn   []Difficulty swallowing. Genitourinary:  []Chronic kidney disease   []Difficult urination  []Frequent urination   []Blood in urine Skin:  []Rashes   []Ulcers  Psychological:  []History of anxiety   [] History of major depression.  Physical Examination  There were no vitals filed for this visit. There is no height or weight on file to calculate BMI. Gen: WD/WN, NAD Head: [place]/AT, No temporalis wasting.  Ear/Nose/Throat: Hearing grossly intact, nares w/o erythema or drainage Eyes: PER, EOMI, sclera nonicteric.  Neck: Supple, no masses.  No bruit or JVD.  Pulmonary:  Good air movement, no audible wheezing, no use of accessory muscles.  Cardiac: RRR, normal S1, S2, no Murmurs. Vascular:  mild trophic changes, no open wounds Vessel Right Left  Radial Palpable Palpable  PT Not Palpable Not Palpable  DP Not Palpable Not Palpable  Gastrointestinal: soft, non-distended. No guarding/no peritoneal signs.  Musculoskeletal: M/S 5/5 throughout.  No visible deformity.  Neurologic: CN 2-12 intact. Pain and light touch intact in extremities.  Symmetrical.  Speech is fluent. Motor exam as listed above. Psychiatric: Judgment intact, Mood & affect appropriate for pt's clinical situation. Dermatologic: No  rashes or ulcers noted.  No changes consistent with cellulitis.   CBC Lab Results  Component Value Date   WBC 10.2 07/12/2021   HGB 11.4 (L) 07/12/2021   HCT 35.1 (L) 07/12/2021   MCV 90.9 07/12/2021   PLT 387 07/12/2021    BMET    Component Value Date/Time   NA 137 07/12/2021 0856   NA 140 03/30/2018 1404   K 4.2 07/12/2021 0856   CL 97 (L) 07/12/2021 0856   CO2 28 07/12/2021 0856   GLUCOSE 185 (H) 07/12/2021 0856   BUN 12 08/07/2021 1146   BUN 9 03/30/2018 1404   CREATININE 0.72 08/07/2021 1146   CALCIUM 10.0 07/12/2021 0856   GFRNONAA >60 08/07/2021 1146   GFRAA 129 03/30/2018 1404   CrCl cannot be calculated (Patient's most recent lab result is older than the maximum 21 days allowed.).  COAG Lab Results  Component Value Date   INR 0.9 06/01/2021   INR 1.0 05/29/2021   INR 0.91 10/09/2016    Radiology No results found.   Assessment/Plan 1. Atherosclerotic peripheral vascular disease with intermittent claudication (  HCC) Recommend:  The patient has evidence of severe atherosclerotic changes of both lower extremities with rest pain that is associated with preulcerative changes and impending tissue loss of the left foot.  This represents a limb threatening ischemia and places the patient at the risk for left limb loss.  If contrast load allows we will plan to image the right leg as well.  Patient should undergo angiography of the left lower extremity with the hope for intervention for limb salvage.  The risks and benefits as well as the alternative therapies was discussed in detail with the patient.  All questions were answered.  Patient agrees to proceed with left lower extremity angiography.  The patient will follow up with me in the office after the procedure.   2. Benign essential hypertension Continue antihypertensive medications as already ordered, these medications have been reviewed and there are no changes at this time.   3. Mixed  hyperlipidemia Continue statin as ordered and reviewed, no changes at this time   4. Secondary osteoarthritis of hip Continue NSAID medications as already ordered, these medications have been reviewed and there are no changes at this time.  Continued activity and therapy was stressed.   5. Pain in both lower extremities Likely a combination of PAD and DJD  Will angios as described and continue current treatment for DJD    [name], MD  01/20/2022 2:59 PM

## 2022-01-20 NOTE — Progress Notes (Signed)
MRN : [id number]  Jeffrey Grant. is a 41 y.o. (1981-02-29) male who presents with chief complaint of check circulation.  History of Present Illness:   The patient returns to the office for followup and review of the noninvasive studies.   The patient notes that there has been a significant deterioration in the lower extremity symptoms.  The patient notes interval shortening of their claudication distance and development of mild rest pain symptoms. No new ulcers or wounds have occurred since the last visit.  There have been no significant changes to the patient's overall health care.  The patient denies amaurosis fugax or recent TIA symptoms. There are no recent neurological changes noted. There is no history of DVT, PE or superficial thrombophlebitis. The patient denies recent episodes of angina or shortness of breath.   ABI Rt=0.70 and Lt=0.62  (previous ABI's Rt=0.91 and Lt=0.92) Duplex ultrasound of the bilateral lower extremities shows >60% stenosis in the distal right common femoral and monophasic flow through the right SFA stent.  On the left there is >70% stenosis in the distal left SFA stent   No outpatient medications have been marked as taking for the 01/21/22 encounter (Appointment) with Gilda Crease, Latina Craver, MD.    Past Medical History:  Diagnosis Date   Anxiety    Atherosclerotic PVD with intermittent claudication (HCC)    Back pain    Collagen vascular disease (HCC)    Dyspnea    Dysrhythmia    Elevated lipids    Hypertension    Nausea    Seropositive rheumatoid arthritis (HCC)     Past Surgical History:  Procedure Laterality Date   AORTA - BILATERAL FEMORAL ARTERY BYPASS GRAFT Bilateral 06/01/2021   Procedure: AORTA BIFEMORAL BYPASS GRAFT;  Surgeon: Renford Dills, MD;  Location: [facility] ORS;  Service: Vascular;  Laterality: Bilateral;   COLONOSCOPY WITH PROPOFOL N/A 04/21/2018   Procedure: COLONOSCOPY WITH PROPOFOL;  Surgeon: [name],  MD;  Location: [facility] ENDOSCOPY;  Service: Gastroenterology;  Laterality: N/A;   ENDARTERECTOMY POPLITEAL Right 06/02/2021   Procedure: Right Femoral Endarterectomy and Patching Endoplasty;  Surgeon: Nada Libman, MD;  Location: [facility] ORS;  Service: Vascular;  Laterality: Right;   ESOPHAGOGASTRODUODENOSCOPY (EGD) WITH PROPOFOL N/A 04/21/2018   Procedure: ESOPHAGOGASTRODUODENOSCOPY (EGD) WITH PROPOFOL;  Surgeon: [name], MD;  Location: [facility] ENDOSCOPY;  Service: Gastroenterology;  Laterality: N/A;   HIP PINNING Right    LOWER EXTREMITY ANGIOGRAPHY N/A 05/29/2021   Procedure: LOWER EXTREMITY ANGIOGRAPHY;  Surgeon: Renford Dills, MD;  Location: [facility] INVASIVE CV LAB;  Service: Cardiovascular;  Laterality: N/A;   LOWER EXTREMITY ANGIOGRAPHY Left 07/24/2021   Procedure: LOWER EXTREMITY ANGIOGRAPHY;  Surgeon: Renford Dills, MD;  Location: [facility] INVASIVE CV LAB;  Service: Cardiovascular;  Laterality: Left;   LOWER EXTREMITY ANGIOGRAPHY Right 08/07/2021   Procedure: Lower Extremity Angiography;  Surgeon: Renford Dills, MD;  Location: [facility] INVASIVE CV LAB;  Service: Cardiovascular;  Laterality: Right;   TOTAL HIP ARTHROPLASTY Right 10/22/2016   Procedure: TOTAL HIP ARTHROPLASTY ANTERIOR APPROACH;  Surgeon: Kennedy Bucker, MD;  Location: [facility] ORS;  Service: Orthopedics;  Laterality: Right;    Social History Social History   Tobacco Use   Smoking status: Former    Packs/day: 0.25    Years: 16.00    Total pack years: 4.00    Types: Cigarettes    Quit date: 06/04/2021    Years since quitting: 0.6  Smokeless tobacco: Never  Vaping Use   Vaping Use: Never used  Substance Use Topics   Alcohol use: No   Drug use: Not Currently    Family History No family history on file.  No Known Allergies   REVIEW OF SYSTEMS (Negative unless checked)  Constitutional: [] Weight loss  [] Fever  [] Chills Cardiac: [] Chest pain   [] Chest pressure   [] Palpitations   [] Shortness of breath when laying  flat   [] Shortness of breath with exertion. Vascular:  [x] Pain in legs with walking   [] Pain in legs at rest  [] History of DVT   [] Phlebitis   [] Swelling in legs   [] Varicose veins   [] Non-healing ulcers Pulmonary:   [] Uses home oxygen   [] Productive cough   [] Hemoptysis   [] Wheeze  [] COPD   [] Asthma Neurologic:  [] Dizziness   [] Seizures   [] History of stroke   [] History of TIA  [] Aphasia   [] Vissual changes   [] Weakness or numbness in arm   [] Weakness or numbness in leg Musculoskeletal:   [] Joint swelling   [] Joint pain   [] Low back pain Hematologic:  [] Easy bruising  [] Easy bleeding   [] Hypercoagulable state   [] Anemic Gastrointestinal:  [] Diarrhea   [] Vomiting  [] Gastroesophageal reflux/heartburn   [] Difficulty swallowing. Genitourinary:  [] Chronic kidney disease   [] Difficult urination  [] Frequent urination   [] Blood in urine Skin:  [] Rashes   [] Ulcers  Psychological:  [] History of anxiety   []  History of major depression.  Physical Examination  There were no vitals filed for this visit. There is no height or weight on file to calculate BMI. Gen: WD/WN, NAD Head: /AT, No temporalis wasting.  Ear/Nose/Throat: Hearing grossly intact, nares w/o erythema or drainage Eyes: PER, EOMI, sclera nonicteric.  Neck: Supple, no masses.  No bruit or JVD.  Pulmonary:  Good air movement, no audible wheezing, no use of accessory muscles.  Cardiac: RRR, normal S1, S2, no Murmurs. Vascular:  mild trophic changes, no open wounds Vessel Right Left  Radial Palpable Palpable  PT Not Palpable Not Palpable  DP Not Palpable Not Palpable  Gastrointestinal: soft, non-distended. No guarding/no peritoneal signs.  Musculoskeletal: M/S 5/5 throughout.  No visible deformity.  Neurologic: CN 2-12 intact. Pain and light touch intact in extremities.  Symmetrical.  Speech is fluent. Motor exam as listed above. Psychiatric: Judgment intact, Mood & affect appropriate for pt's clinical situation. Dermatologic: No  rashes or ulcers noted.  No changes consistent with cellulitis.   CBC Lab Results  Component Value Date   WBC 10.2 07/12/2021   HGB 11.4 (L) 07/12/2021   HCT 35.1 (L) 07/12/2021   MCV 90.9 07/12/2021   PLT 387 07/12/2021    BMET    Component Value Date/Time   NA 137 07/12/2021 0856   NA 140 03/30/2018 1404   K 4.2 07/12/2021 0856   CL 97 (L) 07/12/2021 0856   CO2 28 07/12/2021 0856   GLUCOSE 185 (H) 07/12/2021 0856   BUN 12 08/07/2021 1146   BUN 9 03/30/2018 1404   CREATININE 0.72 08/07/2021 1146   CALCIUM 10.0 07/12/2021 0856   GFRNONAA >60 08/07/2021 1146   GFRAA 129 03/30/2018 1404   CrCl cannot be calculated (Patient's most recent lab result is older than the maximum 21 days allowed.).  COAG Lab Results  Component Value Date   INR 0.9 06/01/2021   INR 1.0 05/29/2021   INR 0.91 10/09/2016    Radiology No results found.   Assessment/Plan 1. Atherosclerotic peripheral vascular disease with intermittent claudication (  HCC) Recommend:  The patient has evidence of severe atherosclerotic changes of both lower extremities with rest pain that is associated with preulcerative changes and impending tissue loss of the left foot.  This represents a limb threatening ischemia and places the patient at the risk for left limb loss.  If contrast load allows we will plan to image the right leg as well.  Patient should undergo angiography of the left lower extremity with the hope for intervention for limb salvage.  The risks and benefits as well as the alternative therapies was discussed in detail with the patient.  All questions were answered.  Patient agrees to proceed with left lower extremity angiography.  The patient will follow up with me in the office after the procedure.   2. Benign essential hypertension Continue antihypertensive medications as already ordered, these medications have been reviewed and there are no changes at this time.   3. Mixed  hyperlipidemia Continue statin as ordered and reviewed, no changes at this time   4. Secondary osteoarthritis of hip Continue NSAID medications as already ordered, these medications have been reviewed and there are no changes at this time.  Continued activity and therapy was stressed.   5. Pain in both lower extremities Likely a combination of PAD and DJD  Will angios as described and continue current treatment for DJD    [name], MD  01/20/2022 2:59 PM

## 2022-01-21 ENCOUNTER — Ambulatory Visit (INDEPENDENT_AMBULATORY_CARE_PROVIDER_SITE_OTHER): Payer: Medicaid Other

## 2022-01-21 ENCOUNTER — Other Ambulatory Visit (INDEPENDENT_AMBULATORY_CARE_PROVIDER_SITE_OTHER): Payer: Self-pay | Admitting: Nurse Practitioner

## 2022-01-21 ENCOUNTER — Encounter (INDEPENDENT_AMBULATORY_CARE_PROVIDER_SITE_OTHER): Payer: Self-pay | Admitting: Vascular Surgery

## 2022-01-21 ENCOUNTER — Ambulatory Visit (INDEPENDENT_AMBULATORY_CARE_PROVIDER_SITE_OTHER): Payer: Medicaid Other | Admitting: Vascular Surgery

## 2022-01-21 VITALS — BP 132/81 | HR 123 | Resp 18 | Ht 69.0 in | Wt 172.0 lb

## 2022-01-21 DIAGNOSIS — I739 Peripheral vascular disease, unspecified: Secondary | ICD-10-CM

## 2022-01-21 DIAGNOSIS — I70219 Atherosclerosis of native arteries of extremities with intermittent claudication, unspecified extremity: Secondary | ICD-10-CM

## 2022-01-21 DIAGNOSIS — I10 Essential (primary) hypertension: Secondary | ICD-10-CM

## 2022-01-21 DIAGNOSIS — M167 Other unilateral secondary osteoarthritis of hip: Secondary | ICD-10-CM

## 2022-01-21 DIAGNOSIS — M79605 Pain in left leg: Secondary | ICD-10-CM

## 2022-01-21 DIAGNOSIS — Z9889 Other specified postprocedural states: Secondary | ICD-10-CM

## 2022-01-21 DIAGNOSIS — M79604 Pain in right leg: Secondary | ICD-10-CM

## 2022-01-21 DIAGNOSIS — E782 Mixed hyperlipidemia: Secondary | ICD-10-CM | POA: Diagnosis not present

## 2022-01-28 ENCOUNTER — Other Ambulatory Visit (INDEPENDENT_AMBULATORY_CARE_PROVIDER_SITE_OTHER): Payer: Self-pay | Admitting: Nurse Practitioner

## 2022-01-29 ENCOUNTER — Telehealth (INDEPENDENT_AMBULATORY_CARE_PROVIDER_SITE_OTHER): Payer: Self-pay

## 2022-01-29 NOTE — Telephone Encounter (Signed)
Spoke with the patient and he is scheduled with Dr. Gilda Crease for bilateral leg angios ( LLE-02/12/22-9:00 am) (right leg-02/19/22-6:45 am) pre-procedure instructions were discussed and will be mailed.

## 2022-01-30 ENCOUNTER — Encounter (INDEPENDENT_AMBULATORY_CARE_PROVIDER_SITE_OTHER): Payer: Self-pay | Admitting: Vascular Surgery

## 2022-02-12 ENCOUNTER — Encounter: Payer: Self-pay | Admitting: Vascular Surgery

## 2022-02-12 ENCOUNTER — Inpatient Hospital Stay: Payer: Medicaid Other

## 2022-02-12 ENCOUNTER — Ambulatory Visit: Payer: Medicaid Other | Admitting: Certified Registered"

## 2022-02-12 ENCOUNTER — Inpatient Hospital Stay
Admission: RE | Admit: 2022-02-12 | Discharge: 2022-02-15 | DRG: 253 | Disposition: A | Discharge status: Home / Self Care | Payer: Medicaid Other | Attending: Vascular Surgery | Admitting: Vascular Surgery

## 2022-02-12 ENCOUNTER — Other Ambulatory Visit: Payer: Self-pay

## 2022-02-12 ENCOUNTER — Encounter
Admission: RE | Disposition: A | Discharge status: Home / Self Care | Payer: Self-pay | Source: Home / Self Care | Attending: Vascular Surgery

## 2022-02-12 DIAGNOSIS — Z79899 Other long term (current) drug therapy: Secondary | ICD-10-CM | POA: Diagnosis not present

## 2022-02-12 DIAGNOSIS — Z96641 Presence of right artificial hip joint: Secondary | ICD-10-CM | POA: Diagnosis present

## 2022-02-12 DIAGNOSIS — I70219 Atherosclerosis of native arteries of extremities with intermittent claudication, unspecified extremity: Secondary | ICD-10-CM | POA: Diagnosis present

## 2022-02-12 DIAGNOSIS — M059 Rheumatoid arthritis with rheumatoid factor, unspecified: Secondary | ICD-10-CM | POA: Diagnosis present

## 2022-02-12 DIAGNOSIS — E782 Mixed hyperlipidemia: Secondary | ICD-10-CM | POA: Diagnosis present

## 2022-02-12 DIAGNOSIS — I998 Other disorder of circulatory system: Secondary | ICD-10-CM | POA: Diagnosis present

## 2022-02-12 DIAGNOSIS — I70212 Atherosclerosis of native arteries of extremities with intermittent claudication, left leg: Secondary | ICD-10-CM | POA: Diagnosis not present

## 2022-02-12 DIAGNOSIS — I70221 Atherosclerosis of native arteries of extremities with rest pain, right leg: Secondary | ICD-10-CM

## 2022-02-12 DIAGNOSIS — Z87891 Personal history of nicotine dependence: Secondary | ICD-10-CM

## 2022-02-12 DIAGNOSIS — T82856A Stenosis of peripheral vascular stent, initial encounter: Secondary | ICD-10-CM

## 2022-02-12 DIAGNOSIS — I10 Essential (primary) hypertension: Secondary | ICD-10-CM | POA: Diagnosis present

## 2022-02-12 DIAGNOSIS — Z01818 Encounter for other preprocedural examination: Principal | ICD-10-CM

## 2022-02-12 DIAGNOSIS — Z9889 Other specified postprocedural states: Secondary | ICD-10-CM

## 2022-02-12 DIAGNOSIS — Z7902 Long term (current) use of antithrombotics/antiplatelets: Secondary | ICD-10-CM | POA: Diagnosis not present

## 2022-02-12 DIAGNOSIS — M167 Other unilateral secondary osteoarthritis of hip: Secondary | ICD-10-CM

## 2022-02-12 DIAGNOSIS — I70623 Atherosclerosis of nonbiological bypass graft(s) of the extremities with rest pain, bilateral legs: Secondary | ICD-10-CM

## 2022-02-12 DIAGNOSIS — Z95828 Presence of other vascular implants and grafts: Secondary | ICD-10-CM

## 2022-02-12 DIAGNOSIS — L97909 Non-pressure chronic ulcer of unspecified part of unspecified lower leg with unspecified severity: Secondary | ICD-10-CM

## 2022-02-12 DIAGNOSIS — Z7982 Long term (current) use of aspirin: Secondary | ICD-10-CM | POA: Diagnosis not present

## 2022-02-12 DIAGNOSIS — T82868A Thrombosis of vascular prosthetic devices, implants and grafts, initial encounter: Secondary | ICD-10-CM | POA: Diagnosis not present

## 2022-02-12 DIAGNOSIS — D62 Acute posthemorrhagic anemia: Secondary | ICD-10-CM | POA: Diagnosis not present

## 2022-02-12 DIAGNOSIS — I70213 Atherosclerosis of native arteries of extremities with intermittent claudication, bilateral legs: Principal | ICD-10-CM | POA: Diagnosis present

## 2022-02-12 DIAGNOSIS — E869 Volume depletion, unspecified: Secondary | ICD-10-CM | POA: Diagnosis present

## 2022-02-12 HISTORY — PX: APPLICATION OF WOUND VAC: SHX5189

## 2022-02-12 HISTORY — PX: EMBOLECTOMY: SHX44

## 2022-02-12 HISTORY — PX: ENDARTERECTOMY FEMORAL: SHX5804

## 2022-02-12 HISTORY — PX: LOWER EXTREMITY ANGIOGRAPHY: CATH118251

## 2022-02-12 LAB — CBC
HCT: 32.7 % — ABNORMAL LOW (ref 39.0–52.0)
HCT: 28.7 % — ABNORMAL LOW (ref 39.0–52.0)
Hemoglobin: 10.5 g/dL — ABNORMAL LOW (ref 13.0–17.0)
Hemoglobin: 9.4 g/dL — ABNORMAL LOW (ref 13.0–17.0)
MCH: 28.5 pg (ref 26.0–34.0)
MCH: 28.9 pg (ref 26.0–34.0)
MCHC: 32.1 g/dL (ref 30.0–36.0)
MCHC: 32.8 g/dL (ref 30.0–36.0)
MCV: 88.9 fL (ref 80.0–100.0)
MCV: 88.3 fL (ref 80.0–100.0)
Platelets: 239 10*3/uL (ref 150–400)
Platelets: 195 10*3/uL (ref 150–400)
RBC: 3.68 MIL/uL — ABNORMAL LOW (ref 4.22–5.81)
RBC: 3.25 MIL/uL — ABNORMAL LOW (ref 4.22–5.81)
RDW: 17 % — ABNORMAL HIGH (ref 11.5–15.5)
RDW: 16.8 % — ABNORMAL HIGH (ref 11.5–15.5)
WBC: 7.9 10*3/uL (ref 4.0–10.5)
WBC: 14 10*3/uL — ABNORMAL HIGH (ref 4.0–10.5)
nRBC: 0 % (ref 0.0–0.2)
nRBC: 0 % (ref 0.0–0.2)

## 2022-02-12 LAB — BASIC METABOLIC PANEL
Anion gap: 5 (ref 5–15)
Anion gap: 6 (ref 5–15)
BUN: 12 mg/dL (ref 6–20)
BUN: 11 mg/dL (ref 6–20)
CO2: 26 mmol/L (ref 22–32)
CO2: 22 mmol/L (ref 22–32)
Calcium: 8.8 mg/dL — ABNORMAL LOW (ref 8.9–10.3)
Calcium: 8.7 mg/dL — ABNORMAL LOW (ref 8.9–10.3)
Chloride: 107 mmol/L (ref 98–111)
Chloride: 109 mmol/L (ref 98–111)
Creatinine, Ser: 1.01 mg/dL (ref 0.61–1.24)
Creatinine, Ser: 0.79 mg/dL (ref 0.61–1.24)
GFR, Estimated: >60 mL/min (ref >60)
GFR, Estimated: >60 mL/min (ref >60)
Glucose, Bld: 97 mg/dL (ref 70–99)
Glucose, Bld: 163 mg/dL — ABNORMAL HIGH (ref 70–99)
Potassium: 4.8 mmol/L (ref 3.5–5.1)
Potassium: 4.7 mmol/L (ref 3.5–5.1)
Sodium: 138 mmol/L (ref 135–145)
Sodium: 137 mmol/L (ref 135–145)

## 2022-02-12 LAB — PROTIME-INR
INR: 0.9 (ref 0.8–1.2)
Prothrombin Time: 12.4 seconds (ref 11.4–15.2)

## 2022-02-12 LAB — GLUCOSE, CAPILLARY: Glucose-Capillary: 149 mg/dL — ABNORMAL HIGH (ref 70–99)

## 2022-02-12 LAB — BUN: BUN: 13 mg/dL (ref 6–20)

## 2022-02-12 LAB — CREATININE, SERUM
Creatinine, Ser: 0.96 mg/dL (ref 0.61–1.24)
GFR, Estimated: >60 mL/min (ref >60)

## 2022-02-12 LAB — APTT: aPTT: 30 seconds (ref 24–36)

## 2022-02-12 LAB — PREPARE RBC (CROSSMATCH)

## 2022-02-12 SURGERY — ENDARTERECTOMY, FEMORAL
Anesthesia: General | Laterality: Right

## 2022-02-12 SURGERY — LOWER EXTREMITY ANGIOGRAPHY
Anesthesia: Moderate Sedation | Site: Leg Lower | Laterality: Left

## 2022-02-12 MED ORDER — VISTASEAL 10 ML SINGLE DOSE KIT
PACK | CUTANEOUS | Status: AC
  Filled 2022-02-12: qty 10

## 2022-02-12 MED ORDER — SODIUM CHLORIDE 0.9 % IV SOLN: 250.0000 mL | INTRAVENOUS | Status: DC | PRN

## 2022-02-12 MED ORDER — BUPIVACAINE HCL (PF) 0.5 % IJ SOLN
INTRAMUSCULAR | Status: AC
  Filled 2022-02-12: qty 30

## 2022-02-12 MED ORDER — OXYCODONE HCL 5 MG PO TABS
ORAL_TABLET | ORAL | Status: AC
  Filled 2022-02-12: qty 1

## 2022-02-12 MED ORDER — PHENYLEPHRINE HCL-NACL 20-0.9 MG/250ML-% IV SOLN
INTRAVENOUS | Status: DC | PRN
  Administered 2022-02-12: 80 ug/min via INTRAVENOUS

## 2022-02-12 MED ORDER — PROPOFOL 10 MG/ML IV BOLUS
INTRAVENOUS | Status: AC
  Filled 2022-02-12: qty 20

## 2022-02-12 MED ORDER — CALCIUM CHLORIDE 10 % IV SOLN
INTRAVENOUS | Status: DC | PRN
  Administered 2022-02-12 (×2): .5 g via INTRAVENOUS

## 2022-02-12 MED ORDER — HYDROMORPHONE HCL 1 MG/ML IJ SOLN: 1.0000 mg | Freq: Once | INTRAMUSCULAR | Status: DC | PRN

## 2022-02-12 MED ORDER — ACETAMINOPHEN 10 MG/ML IV SOLN
INTRAVENOUS | Status: AC
  Filled 2022-02-12: qty 100

## 2022-02-12 MED ORDER — VANCOMYCIN HCL 1000 MG IV SOLR
INTRAVENOUS | Status: AC
  Filled 2022-02-12: qty 20

## 2022-02-12 MED ORDER — LIDOCAINE HCL (PF) 2 % IJ SOLN
INTRAMUSCULAR | Status: AC
  Filled 2022-02-12: qty 5

## 2022-02-12 MED ORDER — SUCCINYLCHOLINE CHLORIDE 200 MG/10ML IV SOSY
PREFILLED_SYRINGE | INTRAVENOUS | Status: AC
  Filled 2022-02-12: qty 10

## 2022-02-12 MED ORDER — ALBUMIN HUMAN 5 % IV SOLN
INTRAVENOUS | Status: AC
  Filled 2022-02-12: qty 500

## 2022-02-12 MED ORDER — ONDANSETRON HCL 4 MG/2ML IJ SOLN
4.0000 mg | Freq: Four times a day (QID) | INTRAMUSCULAR | Status: DC | PRN
Start: 2022-02-12 — End: 2022-02-12

## 2022-02-12 MED ORDER — ACETAMINOPHEN 10 MG/ML IV SOLN
INTRAVENOUS | Status: DC | PRN
  Administered 2022-02-12: 1000 mg via INTRAVENOUS

## 2022-02-12 MED ORDER — CEFAZOLIN SODIUM-DEXTROSE 2-4 GM/100ML-% IV SOLN: 2.0000 g | INTRAVENOUS | Status: AC

## 2022-02-12 MED ORDER — GABAPENTIN 300 MG PO CAPS
300.0000 mg | ORAL_CAPSULE | Freq: Three times a day (TID) | ORAL | Status: DC
  Administered 2022-02-13 (×4): 300 mg via ORAL
  Filled 2022-02-12 (×4): qty 1

## 2022-02-12 MED ORDER — OXYCODONE HCL 5 MG/5ML PO SOLN: 5.0000 mg | Freq: Once | ORAL | Status: DC | PRN

## 2022-02-12 MED ORDER — FENTANYL CITRATE (PF) 100 MCG/2ML IJ SOLN
INTRAMUSCULAR | Status: AC
  Filled 2022-02-12: qty 2

## 2022-02-12 MED ORDER — PHENYLEPHRINE HCL (PRESSORS) 10 MG/ML IV SOLN
INTRAVENOUS | Status: DC | PRN
  Administered 2022-02-12: 80 ug via INTRAVENOUS
  Administered 2022-02-12 (×2): 160 ug via INTRAVENOUS
  Administered 2022-02-12: 40 ug via INTRAVENOUS
  Administered 2022-02-12: 80 ug via INTRAVENOUS
  Administered 2022-02-12 (×3): 160 ug via INTRAVENOUS
  Administered 2022-02-12: 80 ug via INTRAVENOUS
  Administered 2022-02-12: 160 ug via INTRAVENOUS

## 2022-02-12 MED ORDER — MIDAZOLAM HCL 2 MG/2ML IJ SOLN
INTRAMUSCULAR | Status: AC
  Filled 2022-02-12: qty 2

## 2022-02-12 MED ORDER — CHLORHEXIDINE GLUCONATE CLOTH 2 % EX PADS
6.0000 | MEDICATED_PAD | Freq: Every day | CUTANEOUS | Status: DC
  Administered 2022-02-13 – 2022-02-14 (×2): 6 via TOPICAL

## 2022-02-12 MED ORDER — SODIUM CHLORIDE 0.9 % IV SOLN: 500.0000 mL | Freq: Once | INTRAVENOUS | Status: DC | PRN

## 2022-02-12 MED ORDER — FENTANYL CITRATE (PF) 100 MCG/2ML IJ SOLN
INTRAMUSCULAR | Status: DC | PRN
Start: 2022-02-12 — End: 2022-02-12
  Administered 2022-02-12 (×2): 50 ug via INTRAVENOUS

## 2022-02-12 MED ORDER — LABETALOL HCL 5 MG/ML IV SOLN: 10.0000 mg | INTRAVENOUS | Status: DC | PRN

## 2022-02-12 MED ORDER — PROPOFOL 1000 MG/100ML IV EMUL
INTRAVENOUS | Status: AC
  Filled 2022-02-12: qty 100

## 2022-02-12 MED ORDER — SEVOFLURANE IN SOLN
RESPIRATORY_TRACT | Status: AC
  Filled 2022-02-12: qty 250

## 2022-02-12 MED ORDER — ENOXAPARIN SODIUM 40 MG/0.4ML IJ SOSY
40.0000 mg | PREFILLED_SYRINGE | INTRAMUSCULAR | Status: DC
  Administered 2022-02-13 – 2022-02-15 (×3): 40 mg via SUBCUTANEOUS
  Filled 2022-02-12 (×3): qty 0.4

## 2022-02-12 MED ORDER — VASOPRESSIN 20 UNIT/ML IV SOLN
INTRAVENOUS | Status: DC | PRN
  Administered 2022-02-12: 1 [IU] via INTRAVENOUS

## 2022-02-12 MED ORDER — ROCURONIUM BROMIDE 100 MG/10ML IV SOLN
INTRAVENOUS | Status: DC | PRN
  Administered 2022-02-12 (×3): 10 mg via INTRAVENOUS
  Administered 2022-02-12: 60 mg via INTRAVENOUS

## 2022-02-12 MED ORDER — ASPIRIN 81 MG PO TBEC
81.0000 mg | DELAYED_RELEASE_TABLET | Freq: Every day | ORAL | Status: DC
  Administered 2022-02-13 – 2022-02-15 (×3): 81 mg via ORAL
  Filled 2022-02-12 (×3): qty 1

## 2022-02-12 MED ORDER — HYDROMORPHONE HCL 1 MG/ML IJ SOLN
INTRAMUSCULAR | Status: AC
  Filled 2022-02-12: qty 1

## 2022-02-12 MED ORDER — SODIUM CHLORIDE 0.9 % IV SOLN: INTRAVENOUS | Status: DC

## 2022-02-12 MED ORDER — ALBUMIN HUMAN 5 % IV SOLN: INTRAVENOUS | Status: DC | PRN

## 2022-02-12 MED ORDER — PHENYLEPHRINE 80 MCG/ML (10ML) SYRINGE FOR IV PUSH (FOR BLOOD PRESSURE SUPPORT)
PREFILLED_SYRINGE | INTRAVENOUS | Status: AC
  Filled 2022-02-12: qty 10

## 2022-02-12 MED ORDER — CEFAZOLIN SODIUM-DEXTROSE 2-4 GM/100ML-% IV SOLN
2.0000 g | Freq: Three times a day (TID) | INTRAVENOUS | Status: AC
  Administered 2022-02-13 (×2): 2 g via INTRAVENOUS
  Filled 2022-02-12 (×2): qty 100

## 2022-02-12 MED ORDER — PHENOL 1.4 % MT LIQD
1.0000 | OROMUCOSAL | Status: DC | PRN
Start: 2022-02-12 — End: 2022-02-15

## 2022-02-12 MED ORDER — FENTANYL CITRATE (PF) 100 MCG/2ML IJ SOLN
25.0000 ug | INTRAMUSCULAR | Status: DC | PRN
  Administered 2022-02-12 (×2): 25 ug via INTRAVENOUS

## 2022-02-12 MED ORDER — ORAL CARE MOUTH RINSE: 15.0000 mL | OROMUCOSAL | Status: DC | PRN

## 2022-02-12 MED ORDER — OXYCODONE-ACETAMINOPHEN 5-325 MG PO TABS
1.0000 | ORAL_TABLET | ORAL | Status: DC | PRN
  Administered 2022-02-13 – 2022-02-14 (×7): 2 via ORAL
  Filled 2022-02-12 (×7): qty 2

## 2022-02-12 MED ORDER — ACETAMINOPHEN 10 MG/ML IV SOLN: 1000.0000 mg | Freq: Once | INTRAVENOUS | Status: DC | PRN

## 2022-02-12 MED ORDER — VISTASEAL 10 ML SINGLE DOSE KIT
PACK | CUTANEOUS | Status: DC | PRN
  Administered 2022-02-12: 10 mL via TOPICAL

## 2022-02-12 MED ORDER — MIDAZOLAM HCL 2 MG/2ML IJ SOLN
INTRAMUSCULAR | Status: DC | PRN
  Administered 2022-02-12: 2 mg via INTRAVENOUS

## 2022-02-12 MED ORDER — LIDOCAINE HCL (CARDIAC) PF 100 MG/5ML IV SOSY
PREFILLED_SYRINGE | INTRAVENOUS | Status: DC | PRN
  Administered 2022-02-12: 100 mg via INTRAVENOUS

## 2022-02-12 MED ORDER — FENTANYL CITRATE (PF) 100 MCG/2ML IJ SOLN
INTRAMUSCULAR | Status: DC | PRN
  Administered 2022-02-12: 25 ug via INTRAVENOUS
  Administered 2022-02-12: 50 ug via INTRAVENOUS
  Administered 2022-02-12: 25 ug via INTRAVENOUS

## 2022-02-12 MED ORDER — HYDROXYCHLOROQUINE SULFATE 200 MG PO TABS
200.0000 mg | ORAL_TABLET | Freq: Two times a day (BID) | ORAL | Status: DC
  Administered 2022-02-13 – 2022-02-15 (×6): 200 mg via ORAL
  Filled 2022-02-12 (×6): qty 1

## 2022-02-12 MED ORDER — DEXAMETHASONE SODIUM PHOSPHATE 10 MG/ML IJ SOLN
INTRAMUSCULAR | Status: DC | PRN
  Administered 2022-02-12: 10 mg via INTRAVENOUS

## 2022-02-12 MED ORDER — MIDAZOLAM HCL 2 MG/ML PO SYRP: 8.0000 mg | ORAL_SOLUTION | Freq: Once | ORAL | Status: DC | PRN

## 2022-02-12 MED ORDER — SODIUM CHLORIDE 0.9 % IV SOLN
INTRAVENOUS | Status: DC
  Administered 2022-02-12: 1000 mL via INTRAVENOUS

## 2022-02-12 MED ORDER — HYDROMORPHONE HCL 1 MG/ML IJ SOLN
0.5000 mg | INTRAMUSCULAR | Status: DC | PRN
  Administered 2022-02-12 – 2022-02-14 (×2): 1 mg via INTRAVENOUS
  Administered 2022-02-14: 0.5 mg via INTRAVENOUS
  Administered 2022-02-15 (×2): 1 mg via INTRAVENOUS
  Filled 2022-02-12 (×5): qty 1

## 2022-02-12 MED ORDER — CEFAZOLIN SODIUM-DEXTROSE 2-4 GM/100ML-% IV SOLN
INTRAVENOUS | Status: AC
  Administered 2022-02-12: 2 g via INTRAVENOUS
  Filled 2022-02-12: qty 100

## 2022-02-12 MED ORDER — SUGAMMADEX SODIUM 200 MG/2ML IV SOLN
INTRAVENOUS | Status: DC | PRN
  Administered 2022-02-12: 200 mg via INTRAVENOUS

## 2022-02-12 MED ORDER — ONDANSETRON HCL 4 MG/2ML IJ SOLN: 4.0000 mg | Freq: Four times a day (QID) | INTRAMUSCULAR | Status: DC | PRN

## 2022-02-12 MED ORDER — ACETAMINOPHEN 650 MG RE SUPP: 325.0000 mg | RECTAL | Status: DC | PRN

## 2022-02-12 MED ORDER — DOPAMINE-DEXTROSE 3.2-5 MG/ML-% IV SOLN: 3.0000 ug/kg/min | INTRAVENOUS | Status: DC

## 2022-02-12 MED ORDER — IODIXANOL 320 MG/ML IV SOLN
INTRAVENOUS | Status: DC | PRN
  Administered 2022-02-12: 75 mL via INTRA_ARTERIAL

## 2022-02-12 MED ORDER — MIDAZOLAM HCL 2 MG/2ML IJ SOLN
INTRAMUSCULAR | Status: DC | PRN
  Administered 2022-02-12 (×4): 1 mg via INTRAVENOUS

## 2022-02-12 MED ORDER — GUAIFENESIN-DM 100-10 MG/5ML PO SYRP: 15.0000 mL | ORAL_SOLUTION | ORAL | Status: DC | PRN

## 2022-02-12 MED ORDER — ENOXAPARIN SODIUM 30 MG/0.3ML IJ SOSY: 30.0000 mg | PREFILLED_SYRINGE | INTRAMUSCULAR | Status: DC

## 2022-02-12 MED ORDER — FAMOTIDINE 20 MG PO TABS: 40.0000 mg | ORAL_TABLET | Freq: Once | ORAL | Status: DC | PRN

## 2022-02-12 MED ORDER — MORPHINE SULFATE (PF) 4 MG/ML IV SOLN: 2.0000 mg | INTRAVENOUS | Status: DC | PRN

## 2022-02-12 MED ORDER — ONDANSETRON HCL 4 MG/2ML IJ SOLN
INTRAMUSCULAR | Status: AC
  Filled 2022-02-12: qty 2

## 2022-02-12 MED ORDER — GLYCOPYRROLATE 0.2 MG/ML IJ SOLN
INTRAMUSCULAR | Status: DC | PRN
  Administered 2022-02-12: .1 mg via INTRAVENOUS

## 2022-02-12 MED ORDER — SODIUM CHLORIDE 0.9% FLUSH
3.0000 mL | INTRAVENOUS | Status: DC | PRN
Start: 2022-02-12 — End: 2022-02-12

## 2022-02-12 MED ORDER — KETAMINE HCL 10 MG/ML IJ SOLN
INTRAMUSCULAR | Status: DC | PRN
  Administered 2022-02-12: 10 mg via INTRAVENOUS

## 2022-02-12 MED ORDER — NITROGLYCERIN 0.2 MG/ML ON CALL CATH LAB
INTRAVENOUS | Status: DC | PRN
  Administered 2022-02-12 (×2): 40 ug via INTRAVENOUS

## 2022-02-12 MED ORDER — VANCOMYCIN HCL 1000 MG IV SOLR
INTRAVENOUS | Status: DC | PRN
  Administered 2022-02-12: 1000 mg

## 2022-02-12 MED ORDER — HEPARIN SODIUM (PORCINE) 1000 UNIT/ML IJ SOLN
INTRAMUSCULAR | Status: AC
  Filled 2022-02-12: qty 10

## 2022-02-12 MED ORDER — HYDRALAZINE HCL 20 MG/ML IJ SOLN: 5.0000 mg | INTRAMUSCULAR | Status: DC | PRN

## 2022-02-12 MED ORDER — ATORVASTATIN CALCIUM 20 MG PO TABS
80.0000 mg | ORAL_TABLET | Freq: Every day | ORAL | Status: DC
  Administered 2022-02-13 – 2022-02-15 (×3): 80 mg via ORAL
  Filled 2022-02-12 (×3): qty 4

## 2022-02-12 MED ORDER — PROPOFOL 10 MG/ML IV BOLUS
INTRAVENOUS | Status: DC | PRN
  Administered 2022-02-12: 100 mg via INTRAVENOUS
  Administered 2022-02-12: 40 mg via INTRAVENOUS
  Administered 2022-02-12: 200 mg via INTRAVENOUS

## 2022-02-12 MED ORDER — HEMOSTATIC AGENTS (NO CHARGE) OPTIME
TOPICAL | Status: DC | PRN
  Administered 2022-02-12: 1 via TOPICAL

## 2022-02-12 MED ORDER — BUPIVACAINE LIPOSOME 1.3 % IJ SUSP
INTRAMUSCULAR | Status: AC
  Filled 2022-02-12: qty 20

## 2022-02-12 MED ORDER — CALCIUM CHLORIDE 10 % IV SOLN
INTRAVENOUS | Status: AC
  Filled 2022-02-12: qty 10

## 2022-02-12 MED ORDER — POTASSIUM CHLORIDE CRYS ER 20 MEQ PO TBCR: 20.0000 meq | EXTENDED_RELEASE_TABLET | Freq: Every day | ORAL | Status: DC | PRN

## 2022-02-12 MED ORDER — SENNOSIDES-DOCUSATE SODIUM 8.6-50 MG PO TABS: 1.0000 | ORAL_TABLET | Freq: Every evening | ORAL | Status: DC | PRN

## 2022-02-12 MED ORDER — METOPROLOL TARTRATE 5 MG/5ML IV SOLN: 2.0000 mg | INTRAVENOUS | Status: DC | PRN

## 2022-02-12 MED ORDER — ACETAMINOPHEN 325 MG PO TABS: 325.0000 mg | ORAL_TABLET | ORAL | Status: DC | PRN

## 2022-02-12 MED ORDER — HYDROMORPHONE HCL 1 MG/ML IJ SOLN
INTRAMUSCULAR | Status: DC | PRN
  Administered 2022-02-12: .5 mg via INTRAVENOUS

## 2022-02-12 MED ORDER — AMITRIPTYLINE HCL 50 MG PO TABS
50.0000 mg | ORAL_TABLET | Freq: Every day | ORAL | Status: DC
  Administered 2022-02-13 (×2): 50 mg via ORAL
  Filled 2022-02-12 (×2): qty 1

## 2022-02-12 MED ORDER — GLYCOPYRROLATE 0.2 MG/ML IJ SOLN
INTRAMUSCULAR | Status: AC
  Filled 2022-02-12: qty 1

## 2022-02-12 MED ORDER — ONDANSETRON HCL 4 MG/2ML IJ SOLN
INTRAMUSCULAR | Status: DC | PRN
  Administered 2022-02-12: 4 mg via INTRAVENOUS

## 2022-02-12 MED ORDER — DROPERIDOL 2.5 MG/ML IJ SOLN: 0.6250 mg | Freq: Once | INTRAMUSCULAR | Status: DC | PRN

## 2022-02-12 MED ORDER — ALUM & MAG HYDROXIDE-SIMETH 200-200-20 MG/5ML PO SUSP: 15.0000 mL | ORAL | Status: DC | PRN

## 2022-02-12 MED ORDER — 0.9 % SODIUM CHLORIDE (POUR BTL) OPTIME
TOPICAL | Status: DC | PRN
  Administered 2022-02-12: 500 mL

## 2022-02-12 MED ORDER — NITROGLYCERIN IN D5W 200-5 MCG/ML-% IV SOLN: 5.0000 ug/min | INTRAVENOUS | Status: DC

## 2022-02-12 MED ORDER — FAMOTIDINE IN NACL 20-0.9 MG/50ML-% IV SOLN
20.0000 mg | Freq: Two times a day (BID) | INTRAVENOUS | Status: DC
  Administered 2022-02-12 – 2022-02-15 (×6): 20 mg via INTRAVENOUS
  Filled 2022-02-12 (×6): qty 50

## 2022-02-12 MED ORDER — CEFAZOLIN SODIUM-DEXTROSE 2-4 GM/100ML-% IV SOLN
2.0000 g | INTRAVENOUS | Status: AC
  Administered 2022-02-12: 2 g via INTRAVENOUS

## 2022-02-12 MED ORDER — OXYCODONE HCL 5 MG PO TABS: 5.0000 mg | ORAL_TABLET | Freq: Once | ORAL | Status: DC | PRN

## 2022-02-12 MED ORDER — LABETALOL HCL 5 MG/ML IV SOLN
10.0000 mg | INTRAVENOUS | Status: DC | PRN
  Administered 2022-02-13: 10 mg via INTRAVENOUS
  Filled 2022-02-12: qty 4

## 2022-02-12 MED ORDER — DEXMEDETOMIDINE (PRECEDEX) IN NS 20 MCG/5ML (4 MCG/ML) IV SYRINGE
PREFILLED_SYRINGE | INTRAVENOUS | Status: DC | PRN
  Administered 2022-02-12 (×2): 8 ug via INTRAVENOUS
  Administered 2022-02-12: 4 ug via INTRAVENOUS

## 2022-02-12 MED ORDER — DULOXETINE HCL 30 MG PO CPEP
90.0000 mg | ORAL_CAPSULE | Freq: Every day | ORAL | Status: DC
  Administered 2022-02-13 – 2022-02-15 (×3): 90 mg via ORAL
  Filled 2022-02-12 (×3): qty 3

## 2022-02-12 MED ORDER — SORBITOL 70 % SOLN: 30.0000 mL | Freq: Every day | Status: DC | PRN

## 2022-02-12 MED ORDER — DIPHENHYDRAMINE HCL 50 MG/ML IJ SOLN
50.0000 mg | Freq: Once | INTRAMUSCULAR | Status: DC | PRN
Start: 2022-02-12 — End: 2022-02-12

## 2022-02-12 MED ORDER — CEFAZOLIN SODIUM-DEXTROSE 2-4 GM/100ML-% IV SOLN
INTRAVENOUS | Status: AC
  Filled 2022-02-12: qty 100

## 2022-02-12 MED ORDER — HEPARIN 30,000 UNITS/1000 ML (OHS) CELLSAVER SOLUTION
Status: AC
  Filled 2022-02-12: qty 1000

## 2022-02-12 MED ORDER — OXYCODONE HCL 5 MG PO TABS
5.0000 mg | ORAL_TABLET | ORAL | Status: DC | PRN
  Administered 2022-02-12: 5 mg via ORAL

## 2022-02-12 MED ORDER — SUCCINYLCHOLINE CHLORIDE 200 MG/10ML IV SOSY
PREFILLED_SYRINGE | INTRAVENOUS | Status: DC | PRN
  Administered 2022-02-12: 100 mg via INTRAVENOUS

## 2022-02-12 MED ORDER — HEPARIN SODIUM (PORCINE) 1000 UNIT/ML IJ SOLN
INTRAMUSCULAR | Status: DC | PRN
  Administered 2022-02-12: 5000 [IU] via INTRAVENOUS
  Administered 2022-02-12: 3000 [IU] via INTRAVENOUS

## 2022-02-12 MED ORDER — HEPARIN SODIUM (PORCINE) 5000 UNIT/ML IJ SOLN
INTRAMUSCULAR | Status: AC
  Filled 2022-02-12: qty 1

## 2022-02-12 MED ORDER — HYDROMORPHONE HCL 1 MG/ML IJ SOLN
0.5000 mg | INTRAMUSCULAR | Status: DC | PRN
  Administered 2022-02-12: 0.5 mg via INTRAVENOUS

## 2022-02-12 MED ORDER — KETAMINE HCL 50 MG/5ML IJ SOSY
PREFILLED_SYRINGE | INTRAMUSCULAR | Status: AC
  Filled 2022-02-12: qty 5

## 2022-02-12 MED ORDER — DEXMEDETOMIDINE HCL IN NACL 80 MCG/20ML IV SOLN
INTRAVENOUS | Status: AC
  Filled 2022-02-12: qty 20

## 2022-02-12 MED ORDER — MIDAZOLAM HCL 5 MG/5ML IJ SOLN
INTRAMUSCULAR | Status: AC
  Filled 2022-02-12: qty 5

## 2022-02-12 MED ORDER — DEXAMETHASONE SODIUM PHOSPHATE 10 MG/ML IJ SOLN
INTRAMUSCULAR | Status: AC
  Filled 2022-02-12: qty 1

## 2022-02-12 MED ORDER — ACETAMINOPHEN 325 MG PO TABS: 650.0000 mg | ORAL_TABLET | ORAL | Status: DC | PRN

## 2022-02-12 MED ORDER — SODIUM CHLORIDE 0.9% FLUSH: 3.0000 mL | Freq: Two times a day (BID) | INTRAVENOUS | Status: DC

## 2022-02-12 MED ORDER — MAGNESIUM SULFATE 2 GM/50ML IV SOLN: 2.0000 g | Freq: Every day | INTRAVENOUS | Status: DC | PRN

## 2022-02-12 MED ORDER — METHYLPREDNISOLONE SODIUM SUCC 125 MG IJ SOLR
125.0000 mg | Freq: Once | INTRAMUSCULAR | Status: DC | PRN
Start: 2022-02-12 — End: 2022-02-12

## 2022-02-12 MED ORDER — PROMETHAZINE HCL 25 MG/ML IJ SOLN: 6.2500 mg | INTRAMUSCULAR | Status: DC | PRN

## 2022-02-12 MED ORDER — CLOPIDOGREL BISULFATE 75 MG PO TABS
75.0000 mg | ORAL_TABLET | Freq: Every day | ORAL | Status: DC
  Administered 2022-02-13 – 2022-02-15 (×3): 75 mg via ORAL
  Filled 2022-02-12 (×3): qty 1

## 2022-02-12 MED ORDER — DOCUSATE SODIUM 100 MG PO CAPS
100.0000 mg | ORAL_CAPSULE | Freq: Every day | ORAL | Status: DC
  Administered 2022-02-13 – 2022-02-15 (×3): 100 mg via ORAL
  Filled 2022-02-12 (×3): qty 1

## 2022-02-12 SURGICAL SUPPLY — 109 items
APPLIER CLIP 11 MED OPEN (CLIP)
APPLIER CLIP 9.375 SM OPEN (CLIP)
BAG COUNTER SPONGE SURGICOUNT (BAG) ×1 IMPLANT
BAG DECANTER FOR FLEXI CONT (MISCELLANEOUS) ×1 IMPLANT
BAG ISOLATATION DRAPE 20X20 ST (DRAPES) ×1 IMPLANT
BLADE SURG 15 STRL LF DISP TIS (BLADE) ×1 IMPLANT
BLADE SURG 15 STRL SS (BLADE) ×1
BLADE SURG SZ11 CARB STEEL (BLADE) ×1 IMPLANT
BOOT SUTURE AID YELLOW STND (SUTURE) ×2 IMPLANT
BRUSH SCRUB EZ  4% CHG (MISCELLANEOUS) ×1
BRUSH SCRUB EZ 4% CHG (MISCELLANEOUS) ×1 IMPLANT
CANISTER WOUND CARE 500ML ATS (WOUND CARE) IMPLANT
CAP TUBING WOUND VAC TRAC (MISCELLANEOUS) IMPLANT
CATH EMBOLECTOMY 3X80 (CATHETERS) IMPLANT
CHLORAPREP W/TINT 26 (MISCELLANEOUS) ×2 IMPLANT
CLIP APPLIE 11 MED OPEN (CLIP) IMPLANT
CLIP APPLIE 9.375 SM OPEN (CLIP) IMPLANT
CNTNR SPEC 2.5X3XGRAD LEK (MISCELLANEOUS) ×1
CONNECTOR Y WND VAC (MISCELLANEOUS) IMPLANT
CONT SPEC 4OZ STER OR WHT (MISCELLANEOUS) ×1
CONTAINER SPEC 2.5X3XGRAD LEK (MISCELLANEOUS) IMPLANT
DERMABOND ADVANCED (GAUZE/BANDAGES/DRESSINGS)
DERMABOND ADVANCED .7 DNX12 (GAUZE/BANDAGES/DRESSINGS) ×2 IMPLANT
DRAPE 3/4 80X56 (DRAPES) ×1 IMPLANT
DRAPE INCISE IOBAN 66X45 STRL (DRAPES) ×2 IMPLANT
DRAPE ISOLATE BAG 20X20 STRL (DRAPES)
DRESSING SURGICEL FIBRLLR 1X2 (HEMOSTASIS) ×1 IMPLANT
DRSG OPSITE POSTOP 4X10 (GAUZE/BANDAGES/DRESSINGS) IMPLANT
DRSG OPSITE POSTOP 4X6 (GAUZE/BANDAGES/DRESSINGS) ×1 IMPLANT
DRSG OPSITE POSTOP 4X8 (GAUZE/BANDAGES/DRESSINGS) IMPLANT
DRSG SURGICEL FIBRILLAR 1X2 (HEMOSTASIS) ×2
DRSG VAC ATS LRG SENSATRAC (GAUZE/BANDAGES/DRESSINGS) IMPLANT
ELECT CAUTERY BLADE 6.4 (BLADE) ×1 IMPLANT
ELECT REM PT RETURN 9FT ADLT (ELECTROSURGICAL) ×1
ELECTRODE REM PT RTRN 9FT ADLT (ELECTROSURGICAL) ×1 IMPLANT
GAUZE 4X4 16PLY ~~LOC~~+RFID DBL (SPONGE) ×1 IMPLANT
GEL ULTRASOUND 20GR AQUASONIC (MISCELLANEOUS) IMPLANT
GLOVE SURG SYN 7.0 (GLOVE) ×2 IMPLANT
GLOVE SURG SYN 7.0 PF PI (GLOVE) ×2 IMPLANT
GLOVE SURG SYN 8.0 (GLOVE) ×2 IMPLANT
GLOVE SURG SYN 8.0 PF PI (GLOVE) ×2 IMPLANT
GOWN STRL REUS W/ TWL LRG LVL3 (GOWN DISPOSABLE) ×2 IMPLANT
GOWN STRL REUS W/ TWL XL LVL3 (GOWN DISPOSABLE) ×2 IMPLANT
GOWN STRL REUS W/TWL LRG LVL3 (GOWN DISPOSABLE) ×2
GOWN STRL REUS W/TWL XL LVL3 (GOWN DISPOSABLE) ×2
HEAD CUTTING 'VALVULOTOME URSL (MISCELLANEOUS) IMPLANT
IV NS 500ML (IV SOLUTION) ×1
IV NS 500ML BAXH (IV SOLUTION) ×1 IMPLANT
KIT PREVENA INCISION MGT 13 (CANNISTER) IMPLANT
KIT PREVENA INCISION MGT20CM45 (CANNISTER) IMPLANT
KIT TURNOVER KIT A (KITS) ×1 IMPLANT
LABEL OR SOLS (LABEL) ×1 IMPLANT
LOOP RED MAXI  1X406MM (MISCELLANEOUS) ×2
LOOP VESSEL MAXI 1X406 RED (MISCELLANEOUS) ×3 IMPLANT
LOOP VESSEL MINI 0.8X406 BLUE (MISCELLANEOUS) ×3 IMPLANT
LOOPS BLUE MINI 0.8X406MM (MISCELLANEOUS) ×2
MANIFOLD NEPTUNE II (INSTRUMENTS) ×1 IMPLANT
NDL FILTER BLUNT 18X1 1/2 (NEEDLE) ×1 IMPLANT
NDL HYPO 18GX1.5 BLUNT FILL (NEEDLE) ×1 IMPLANT
NEEDLE FILTER BLUNT 18X 1/2SAF (NEEDLE) ×1
NEEDLE FILTER BLUNT 18X1 1/2 (NEEDLE) ×1 IMPLANT
NEEDLE HYPO 18GX1.5 BLUNT FILL (NEEDLE) ×1 IMPLANT
NS IRRIG 1000ML POUR BTL (IV SOLUTION) ×1 IMPLANT
NS IRRIG 500ML POUR BTL (IV SOLUTION) ×1 IMPLANT
PACK BASIN MAJOR ARMC (MISCELLANEOUS) ×1 IMPLANT
PACK UNIVERSAL (MISCELLANEOUS) ×1 IMPLANT
PAD PREP 24X41 OB/GYN DISP (PERSONAL CARE ITEMS) ×1 IMPLANT
PATCH CAROTID ECM VASC 1X10 (Prosthesis & Implant Heart) IMPLANT
PLEDGET CV PTFE 7X3 (MISCELLANEOUS) IMPLANT
RETRACTOR TRAXI PANNICULUS (MISCELLANEOUS) IMPLANT
SET WALTER ACTIVATION W/DRAPE (SET/KITS/TRAYS/PACK) ×1 IMPLANT
SPIKE FLUID TRANSFER (MISCELLANEOUS) ×1 IMPLANT
SPONGE T-LAP 18X18 ~~LOC~~+RFID (SPONGE) ×3 IMPLANT
STAPLER SKIN PROX 35W (STAPLE) ×1 IMPLANT
SUT ETHIBOND CT1 BRD #0 30IN (SUTURE) IMPLANT
SUT MNCRL+ 5-0 UNDYED PC-3 (SUTURE) ×1 IMPLANT
SUT MONOCRYL 5-0 (SUTURE) ×1
SUT PROLENE 3 0 SH DA (SUTURE) ×1 IMPLANT
SUT PROLENE 5 0 RB 1 DA (SUTURE) ×2 IMPLANT
SUT PROLENE 6 0 BV (SUTURE) ×6 IMPLANT
SUT PROLENE 7 0 BV 1 (SUTURE) ×4 IMPLANT
SUT SILK 2 0 (SUTURE) ×1
SUT SILK 2 0 SH (SUTURE) ×1 IMPLANT
SUT SILK 2-0 18XBRD TIE 12 (SUTURE) ×1 IMPLANT
SUT SILK 3 0 (SUTURE) ×1
SUT SILK 3-0 18XBRD TIE 12 (SUTURE) ×1 IMPLANT
SUT SILK 4 0 (SUTURE) ×1
SUT SILK 4-0 18XBRD TIE 12 (SUTURE) ×1 IMPLANT
SUT VIC AB 2-0 CT1 (SUTURE) ×3 IMPLANT
SUT VIC AB 2-0 CT1 27 (SUTURE)
SUT VIC AB 2-0 CT1 TAPERPNT 27 (SUTURE) ×2 IMPLANT
SUT VIC AB 2-0 SH 27 (SUTURE)
SUT VIC AB 2-0 SH 27XBRD (SUTURE) IMPLANT
SUT VIC AB 3-0 SH 27 (SUTURE)
SUT VIC AB 3-0 SH 27X BRD (SUTURE) ×1 IMPLANT
SUT VICRYL+ 3-0 36IN CT-1 (SUTURE) ×3 IMPLANT
SYR 20ML LL LF (SYRINGE) ×1 IMPLANT
SYR 3ML LL SCALE MARK (SYRINGE) ×1 IMPLANT
SYR 5ML LL (SYRINGE) ×1 IMPLANT
TAPE UMBILICAL 1/8 X36 TWILL (MISCELLANEOUS) ×1 IMPLANT
TOWEL OR 17X26 4PK STRL BLUE (TOWEL DISPOSABLE) ×1 IMPLANT
TRAP FLUID SMOKE EVACUATOR (MISCELLANEOUS) ×1 IMPLANT
TRAXI PANNICULUS RETRACTOR (MISCELLANEOUS)
TRAY FOLEY MTR SLVR 16FR STAT (SET/KITS/TRAYS/PACK) ×1 IMPLANT
VALVULOTOME HEAD CUTTING URSL (MISCELLANEOUS) IMPLANT
VALVULOTOME URESIL (MISCELLANEOUS)
WATER STERILE IRR 500ML POUR (IV SOLUTION) ×1 IMPLANT
WND VAC CAP TUBING TRAC (MISCELLANEOUS)
WND VAC CONN Y (MISCELLANEOUS)

## 2022-02-12 SURGICAL SUPPLY — 16 items
CATH ANGIO 5F PIGTAIL 65CM (CATHETERS) IMPLANT
CATH MICROCATH PRGRT 2.8F 110 (CATHETERS) IMPLANT
CATH VS15FR (CATHETERS) IMPLANT
GLIDEWIRE ADV .035X180CM (WIRE) IMPLANT
MICROCATH PROGREAT 2.8F 110 CM (CATHETERS) ×1
NDL ENTRY 21GA 7CM ECHOTIP (NEEDLE) IMPLANT
NEEDLE ENTRY 21GA 7CM ECHOTIP (NEEDLE) ×1 IMPLANT
PACK ANGIOGRAPHY (CUSTOM PROCEDURE TRAY) ×1 IMPLANT
SET INTRO CAPELLA COAXIAL (SET/KITS/TRAYS/PACK) IMPLANT
SHEATH BRITE TIP 6FRX11 (SHEATH) IMPLANT
SHEATH PROBE COVER 6X72 (BAG) IMPLANT
SYR CONTROL 10ML ANGIOGRAPHIC (SYRINGE) IMPLANT
SYR MEDRAD MARK 7 150ML (SYRINGE) IMPLANT
TUBING CONTRAST HIGH PRESS 72 (TUBING) IMPLANT
WIRE G V18X300CM (WIRE) IMPLANT
WIRE GUIDERIGHT .035X150 (WIRE) IMPLANT

## 2022-02-12 NOTE — Op Note (Signed)
OPERATIVE NOTE   PROCEDURE: Right common femoral and superficial femoral endarterectomy with Cormatrix patch angioplasty. Thromboembolectomy of the right limb aortobifemoral bypass graft with a #3 Fogarty. Redo right groin surgery for exposure of the femoral arteries.  PRE-OPERATIVE DIAGNOSIS: Acute on chronic ischemia of the right lower extremity secondary to thrombosis right limb of the AV graft post angiogram, atherosclerotic occlusive disease right lower extremity with lifestyle limiting claudication and rest pain symptoms;    POST-OPERATIVE DIAGNOSIS: Same  CO-SURGEON: Renford Dills, MD and Annice Needy, M.D.  ASSISTANT(S): None  ANESTHESIA: general  ESTIMATED BLOOD LOSS: 300 cc  FINDING(S): Profound plaque of the right common femoral extending past the initial bifurcation of the profunda femoris arteries as well as down the extensive length of the SFA associated with thrombosis of the right limb of the aortobifemoral bypass graft  SPECIMEN(S):  Plaque from the common femoral and superficial femoral arteries as well as thrombus from the right limb of the aortobifemoral bypass graft  INDICATIONS:   Jeffrey Grant. 41 y.o. y.o.male who presents with complaints of lifestyle limiting claudication and pain continuously in the right lower extremity. The patient has documented severe atherosclerotic occlusive disease and has undergone minimally invasive treatments in the past.  Earlier today the patient underwent diagnostic angiography and was found to have a situation that would require surgical revision.  Unfortunately, he was noted to have an acute change in the status of his right lower extremity postoperatively, thrombosis of the femoral site was suspected and given the acute on chronic ischemic situation I elected to move forward with surgical revision urgently. The patient is therefore undergoing open right endarterectomy. The risks  and benefits of surgery have been reviewed with the patient, all questions have answered; alternative therapies have been reviewed as well and the patient has agreed to proceed with surgical open repair.  DESCRIPTION: After obtaining full informed written consent, the patient was brought back to the operating room and placed supine upon the operating table.  The patient received IV antibiotics prior to induction.  After obtaining adequate anesthesia, the patient was prepped and draped in the standard fashion for right femoral exposure.  Co-surgeons are required because this is a complicated procedure with work being performed simultaneously from both the patient's right left sides.  This also expedites the procedure making a shorter operative time reducing complications and improving patient safety.  Attention was turned to the right groin with Dr. Wyn Quaker working on the left and myself working on the right of the patient.  Vertical  Incision was made through the previous scar over the right common femoral artery and dissection carried down to the common femoral artery with electrocautery.  I dissected out the common femoral artery from the distal limb of the aortobifemoral bypass graft down to the femoral bifurcation.  On initial inspection, the common femoral artery was: Hard and there was no palpable pulse noted.    Subsequently the dissection was continued to include the profunda femoral artery to its tertiary branches and superficial femoral artery for a distance of approximately 10 to 12 cm.  This was a redo surgery and the dissection was particularly tedious secondary to the patient's extensive scar tissue, venous bleeding was encountered during the dissection and this was controlled with multiple pledgeted Prolene sutures.  Numerous small veins and arterial branches were also encountered and these were ligated and divided between 3-0 silk ties or Prolene sutures.  Control of all arterial branches was  obtained with  Silastic vessel loops.      The patient was given an initial bolus of 4000 units of Heparin intravenously, and supplemental but will boluses as needed.   After waiting 3 minutes, the distal external iliac artery and the distal aortobifemoral bypass limb was clamped with a Satinsky clamp and all of the vessel loops were placed under tension.  Arteriotomy was made in the common femoral artery with a 11-blade and extended it with a Potts scissor proximally opening approximately 10 to 15 mm of the bypass graft and distally extending the distal end down the SFA for approximately 10-12 cm.  Thrombus was encountered.  A #3 Fogarty was delivered onto the field and several passes were made up the right limb of the aortobifemoral bypass graft.  The initial pass returned a large amount of thrombus.  Subsequent 2 passes did not return any further thrombus in the limb was flushed with heparinized saline and reclamped.  Endarterectomy was then performed under direct visualization using a freer elevator and a right angle from the mid common femoral extending up both proximally and distally. Proximally the endarterectomy was brought up to the level of the clamp where a clean edge was obtained. Distally the endarterectomy was carried down to the stent previously placed in the SFA where a feathered edge would was obtained.  5-0 Prolene interrupted tacking sutures were placed to secure the leading edge of the plaque in the SFA.  The profunda femoris was treated with an eversion technique extending endarterectomy approximately 2 cm distally again obtaining a featheredged.   At this point, a corematrix patch was fashioned for the geometry of the arteriotomy.  The patch was sewn to the artery with 2 running stitches of 5-0 Prolene, running from each end.  We began sewing the patch to the distal portion of the aortobifemoral bypass graft and then extending it to the SFA.  Prior to completing the patch angioplasty,  the profunda femoral artery was flushed as was the superficial femoral artery. The system was then forward flushed. The endarterectomy site was then irrigated copiously with heparinized saline. The patch angioplasty was completed in the usual fashion.  Flow was then reestablished first to the profunda femoris and then the superficial femoral artery. Any gaps or bleeding sites in the suture line were easily controlled with a 6-0 Prolene suture.   The right groin was then irrigated copiously with sterile saline and subsequently Evicel and Surgicel were placed in the wound. The incision was repaired with a double layer of 2-0 Vicryl, a double layer of 3-0 Vicryl, and staples.  The skin was cleaned, dried, and a Prevena VAC was applied.  COMPLICATIONS: None  CONDITION: Almon Register, M.D. [facility] Office: [phone]  02/12/2022, 7:33 PM

## 2022-02-12 NOTE — Progress Notes (Signed)
Patient c/o right foot drop. This is new. Also states right leg feels tight from thigh down to foot. Made MD aware. Also femoral site continues to have a slow ooze. Made MD aware also.

## 2022-02-12 NOTE — Anesthesia Preprocedure Evaluation (Addendum)
Anesthesia Evaluation  Patient identified by MRN, date of birth, ID band Patient awake    Reviewed: Allergy & Precautions, NPO status , Patient's Chart, lab work & pertinent test results  Airway Mallampati: III  TM Distance: >3 FB Neck ROM: full    Dental  (+) Chipped   Pulmonary sleep apnea , former smoker,    Pulmonary exam normal        Cardiovascular hypertension, Pt. on medications + Peripheral Vascular Disease  Normal cardiovascular exam  Sjogren's disease  ECHO reviewed normal 12/22  Stress test in December 2022 showing normal myocardial perfusion with no evidence of significant new symptoms of angina or shortness of breath.  The patient also had an echocardiogram showing normal LV systolic function and no evidence of significant LV systolic dysfunction or valvular heart disease needing further intervention.   Status post aortobifem bypass with significant worsening lower extremity claudication    Neuro/Psych PSYCHIATRIC DISORDERS Anxiety Schizophrenia Left foot drop with decreased sensation to dorsum of foot  Neuromuscular disease    GI/Hepatic negative GI ROS, Neg liver ROS,   Endo/Other  negative endocrine ROS  Renal/GU      Musculoskeletal   Abdominal   Peds  Hematology negative hematology ROS (+)   Anesthesia Other Findings Pt with suspected thrombosis of the right common femoral area post angio today.  Past Medical History: No date: Anxiety No date: Atherosclerotic PVD with intermittent claudication (HCC) No date: Back pain No date: Collagen vascular disease (HCC) No date: Dyspnea No date: Dysrhythmia No date: Elevated lipids No date: Hypertension No date: Nausea No date: Seropositive rheumatoid arthritis (HCC)  Past Surgical History: 06/01/2021: AORTA - BILATERAL FEMORAL ARTERY BYPASS GRAFT; Bilateral     Comment:  Procedure: AORTA BIFEMORAL BYPASS GRAFT;  Surgeon:               Renford Dills, MD;  Location: [facility] ORS;  Service:               Vascular;  Laterality: Bilateral; 04/21/2018: COLONOSCOPY WITH PROPOFOL; N/A     Comment:  Procedure: COLONOSCOPY WITH PROPOFOL;  Surgeon: [name], MD;  Location: [facility] ENDOSCOPY;  Service:               Gastroenterology;  Laterality: N/A; 06/02/2021: ENDARTERECTOMY POPLITEAL; Right     Comment:  Procedure: Right Femoral Endarterectomy and Patching               Endoplasty;  Surgeon: Nada Libman, MD;  Location:               [facility] ORS;  Service: Vascular;  Laterality: Right; 04/21/2018: ESOPHAGOGASTRODUODENOSCOPY (EGD) WITH PROPOFOL; N/A     Comment:  Procedure: ESOPHAGOGASTRODUODENOSCOPY (EGD) WITH               PROPOFOL;  Surgeon: [name], MD;  Location: [facility]               ENDOSCOPY;  Service: Gastroenterology;  Laterality: N/A; No date: HIP PINNING; Right 05/29/2021: LOWER EXTREMITY ANGIOGRAPHY; N/A     Comment:  Procedure: LOWER EXTREMITY ANGIOGRAPHY;  Surgeon:               Renford Dills, MD;  Location: [facility] INVASIVE CV LAB;               Service: Cardiovascular;  Laterality: N/A; 07/24/2021: LOWER EXTREMITY ANGIOGRAPHY; Left  Comment:  Procedure: LOWER EXTREMITY ANGIOGRAPHY;  Surgeon:               Renford Dills, MD;  Location: [facility] INVASIVE CV LAB;               Service: Cardiovascular;  Laterality: Left; 08/07/2021: LOWER EXTREMITY ANGIOGRAPHY; Right     Comment:  Procedure: Lower Extremity Angiography;  Surgeon:               Renford Dills, MD;  Location: [facility] INVASIVE CV LAB;               Service: Cardiovascular;  Laterality: Right; 10/22/2016: TOTAL HIP ARTHROPLASTY; Right     Comment:  Procedure: TOTAL HIP ARTHROPLASTY ANTERIOR APPROACH;                Surgeon: Kennedy Bucker, MD;  Location: [facility] ORS;  Service:              Orthopedics;  Laterality: Right;  BMI    Body Mass Index: 25.84 kg/m      Reproductive/Obstetrics negative OB ROS                             Anesthesia Physical Anesthesia Plan  ASA: 3 and emergent  Anesthesia Plan: General ETT   Post-op Pain Management: Ofirmev IV (intra-op)*, Dilaudid IV and Ketamine IV*   Induction: Intravenous, Rapid sequence and Cricoid pressure planned  PONV Risk Score and Plan: 2 and Ondansetron, Dexamethasone and Midazolam  Airway Management Planned: Oral ETT  Additional Equipment: Arterial line  Intra-op Plan:   Post-operative Plan: Extubation in OR  Informed Consent: I have reviewed the patients History and Physical, chart, labs and discussed the procedure including the risks, benefits and alternatives for the proposed anesthesia with the patient or authorized representative who has indicated his/her understanding and acceptance.     Dental Advisory Given  Plan Discussed with: Anesthesiologist, CRNA and Surgeon  Anesthesia Plan Comments: (Ate lunch. Emergent case. Will RSI)       Anesthesia Quick Evaluation

## 2022-02-12 NOTE — Transfer of Care (Signed)
Immediate Anesthesia Transfer of Care Note  Patient: Jeffrey Grant.  Procedure(s) Performed: ENDARTERECTOMY FEMORAL (Right) APPLICATION OF CELL SAVER (Right) APPLICATION OF WOUND VAC (Right) EMBOLECTOMY (Right)  Patient Location: PACU  Anesthesia Type:General  Level of Consciousness: awake, alert  and oriented  Airway & Oxygen Therapy: Patient Spontanous Breathing and Patient connected to face mask oxygen  Post-op Assessment: Report given to RN, Post -op Vital signs reviewed and stable and Patient moving all extremities  Post vital signs: Reviewed and stable  Last Vitals:  Vitals Value Taken Time  BP 139/93 02/12/22 1955  Temp    Pulse 102 02/12/22 1951  Resp 12 02/12/22 1959  SpO2 94 % 02/12/22 1951  Vitals shown include unvalidated device data.  Last Pain:  Vitals:   02/12/22 1533  TempSrc: Oral  PainSc: 0-No pain         Complications: No notable events documented.

## 2022-02-12 NOTE — Progress Notes (Signed)
               MRN : [id number]  Jeffrey Ellerson Jr. is a 41 y.o. (03/12/1981) male who presents with chief complaint of check circulation.  History of Present Illness:  Called to patient's bedside patient was unable to move his foot and described increasing pain of his entire leg.  Earlier today at the time of angiogram he was found to have profound stenosis of the right common femoral that threatened patency of his bypass graft.  His common femoral proximal SFA and origin of the profunda are all greater than 95% stenotic.  Current Meds  Medication Sig   amLODipine (NORVASC) 10 MG tablet Take 0.5 tablets (5 mg total) by mouth in the morning. (Patient taking differently: Take 10 mg by mouth in the morning.)   aspirin EC 81 MG EC tablet Take 1 tablet (81 mg total) by mouth daily. Swallow whole.   atorvastatin (LIPITOR) 80 MG tablet Take 1 tablet (80 mg total) by mouth daily.   cilostazol (PLETAL) 100 MG tablet Take 1 tablet (100 mg total) by mouth 2 (two) times daily.   clopidogrel (PLAVIX) 75 MG tablet Take 1 tablet (75 mg total) by mouth daily.   DULoxetine (CYMBALTA) 30 MG capsule Take 90 mg by mouth daily.   folic acid (FOLVITE) 400 MCG tablet Take 400 mcg by mouth daily.   hydroxychloroquine (PLAQUENIL) 200 MG tablet Take 200 mg by mouth 2 (two) times daily.   lisinopril (ZESTRIL) 20 MG tablet Take 20 mg by mouth daily.   LYRICA 100 MG capsule Take 100 mg by mouth 3 (three) times daily.   oxyCODONE-acetaminophen (PERCOCET) 7.5-325 MG tablet Take 1 tablet by mouth every 6 (six) hours as needed for severe pain.    Past Medical History:  Diagnosis Date   Anxiety    Atherosclerotic PVD with intermittent claudication (HCC)    Back pain    Collagen vascular disease (HCC)    Dyspnea    Dysrhythmia    Elevated lipids    Hypertension    Nausea    Seropositive rheumatoid arthritis (HCC)     Past Surgical History:  Procedure Laterality Date   AORTA - BILATERAL FEMORAL ARTERY  BYPASS GRAFT Bilateral 06/01/2021   Procedure: AORTA BIFEMORAL BYPASS GRAFT;  Surgeon: Ayoub Arey G, MD;  Location: [facility] ORS;  Service: Vascular;  Laterality: Bilateral;   COLONOSCOPY WITH PROPOFOL N/A 04/21/2018   Procedure: COLONOSCOPY WITH PROPOFOL;  Surgeon: Anna, [name], MD;  Location: [facility] ENDOSCOPY;  Service: Gastroenterology;  Laterality: N/A;   ENDARTERECTOMY POPLITEAL Right 06/02/2021   Procedure: Right Femoral Endarterectomy and Patching Endoplasty;  Surgeon: Brabham, Vance W, MD;  Location: [facility] ORS;  Service: Vascular;  Laterality: Right;   ESOPHAGOGASTRODUODENOSCOPY (EGD) WITH PROPOFOL N/A 04/21/2018   Procedure: ESOPHAGOGASTRODUODENOSCOPY (EGD) WITH PROPOFOL;  Surgeon: Anna, [name], MD;  Location: [facility] ENDOSCOPY;  Service: Gastroenterology;  Laterality: N/A;   HIP PINNING Right    LOWER EXTREMITY ANGIOGRAPHY N/A 05/29/2021   Procedure: LOWER EXTREMITY ANGIOGRAPHY;  Surgeon: Anisha Starliper G, MD;  Location: [facility] INVASIVE CV LAB;  Service: Cardiovascular;  Laterality: N/A;   LOWER EXTREMITY ANGIOGRAPHY Left 07/24/2021   Procedure: LOWER EXTREMITY ANGIOGRAPHY;  Surgeon: Mylon Mabey G, MD;  Location: [facility] INVASIVE CV LAB;  Service: Cardiovascular;  Laterality: Left;   LOWER EXTREMITY ANGIOGRAPHY Right 08/07/2021   Procedure: Lower Extremity Angiography;  Surgeon: Britini Garcilazo G, MD;  Location: [facility] INVASIVE CV LAB;  Service: Cardiovascular;  Laterality: Right;   TOTAL   HIP ARTHROPLASTY Right 10/22/2016   Procedure: TOTAL HIP ARTHROPLASTY ANTERIOR APPROACH;  Surgeon: Michael Menz, MD;  Location: [facility] ORS;  Service: Orthopedics;  Laterality: Right;    Social History Social History   Tobacco Use   Smoking status: Former    Packs/day: 0.25    Years: 16.00    Total pack years: 4.00    Types: Cigarettes    Quit date: 06/04/2021    Years since quitting: 0.6   Smokeless tobacco: Never  Vaping Use   Vaping Use: Never used  Substance Use Topics   Alcohol use: No   Drug  use: Not Currently    Family History History reviewed. No pertinent family history.  No Known Allergies   REVIEW OF SYSTEMS (Negative unless checked)  Constitutional: []Weight loss  []Fever  []Chills Cardiac: []Chest pain   []Chest pressure   []Palpitations   []Shortness of breath when laying flat   []Shortness of breath with exertion. Vascular:  [x]Pain in legs with walking   []Pain in legs at rest  []History of DVT   []Phlebitis   []Swelling in legs   []Varicose veins   []Non-healing ulcers Pulmonary:   []Uses home oxygen   []Productive cough   []Hemoptysis   []Wheeze  []COPD   []Asthma Neurologic:  []Dizziness   []Seizures   []History of stroke   []History of TIA  []Aphasia   []Vissual changes   []Weakness or numbness in arm   []Weakness or numbness in leg Musculoskeletal:   []Joint swelling   []Joint pain   []Low back pain Hematologic:  []Easy bruising  []Easy bleeding   []Hypercoagulable state   []Anemic Gastrointestinal:  []Diarrhea   []Vomiting  []Gastroesophageal reflux/heartburn   []Difficulty swallowing. Genitourinary:  []Chronic kidney disease   []Difficult urination  []Frequent urination   []Blood in urine Skin:  []Rashes   []Ulcers  Psychological:  []History of anxiety   [] History of major depression.  Physical Examination  Vitals:   02/12/22 1200 02/12/22 1230 02/12/22 1300 02/12/22 1330  BP: 115/78 105/70 92/77 101/77  Pulse: 81 77 95 81  Resp: 12 11 14 11  Temp:      TempSrc:      SpO2: 100% 100% 100% 97%  Weight:      Height:       Body mass index is 25.84 kg/m. Gen: WD/WN, NAD Head: /AT, No temporalis wasting.  Ear/Nose/Throat: Hearing grossly intact, nares w/o erythema or drainage Eyes: PER, EOMI, sclera nonicteric.  Neck: Supple, no masses.  No bruit or JVD.  Pulmonary:  Good air movement, no audible wheezing, no use of accessory muscles.  Cardiac: RRR, normal S1, S2, no Murmurs. Vascular: The foot is cool to the touch.  With the Doppler I am able  to get a faint signal over the right limb of the aortobifemoral graft.  There is a strong triphasic left signal., no open wounds Vessel Right Left  Radial Palpable Palpable  PT Not Palpable Not Palpable  DP Not Palpable Not Palpable  Gastrointestinal: soft, non-distended. No guarding/no peritoneal signs.  Musculoskeletal: M/S 5/5 throughout.  No visible deformity.  Neurologic: CN 2-12 intact. Pain and light touch intact in extremities.  Symmetrical.  Speech is fluent. Motor exam as listed above. Psychiatric: Judgment intact, Mood & affect appropriate for pt's clinical situation. Dermatologic: No rashes or ulcers noted.  No changes consistent with cellulitis.   CBC Lab Results  Component Value Date   WBC 10.2 07/12/2021   HGB 11.4 (L) 07/12/2021     HCT 35.1 (L) 07/12/2021   MCV 90.9 07/12/2021   PLT 387 07/12/2021    BMET    Component Value Date/Time   NA 137 07/12/2021 0856   NA 140 03/30/2018 1404   K 4.2 07/12/2021 0856   CL 97 (L) 07/12/2021 0856   CO2 28 07/12/2021 0856   GLUCOSE 185 (H) 07/12/2021 0856   BUN 13 02/12/2022 0919   BUN 9 03/30/2018 1404   CREATININE 0.96 02/12/2022 0919   CALCIUM 10.0 07/12/2021 0856   GFRNONAA >60 02/12/2022 0919   GFRAA 129 03/30/2018 1404   Estimated Creatinine Clearance: 101.3 mL/min (by C-G formula based on SCr of 0.96 mg/dL).  COAG Lab Results  Component Value Date   INR 0.9 06/01/2021   INR 1.0 05/29/2021   INR 0.91 10/09/2016    Radiology PERIPHERAL VASCULAR CATHETERIZATION  Result Date: 02/12/2022 See surgical note for result.  VAS US LOWER EXTREMITY ARTERIAL DUPLEX  Result Date: 01/31/2022 LOWER EXTREMITY ARTERIAL DUPLEX STUDY Patient Name:  Jeffrey Broberg Jr.  Date of Exam:   01/21/2022 Medical Rec #: [id number]          Accession #:    [id number] Date of Birth: 02/09/1981          Patient Gender: M Patient Age:   40 years Exam Location:  [facility] Procedure:      VAS US LOWER EXTREMITY ARTERIAL DUPLEX  Referring Phys: FALLON BROWN --------------------------------------------------------------------------------  Indications: Peripheral artery disease. High Risk Factors: Current smoker.  Vascular Interventions: 06/01/21: Aortobifemoral BPG with bilateral CFA/SFA                         endarterectomies;                         06/02/21: Right SFA endarterectomy/angioplasty;. Current ABI:            Right: 0.70, Left : 0.62 Performing Technologist: Matthew Cravey RVT  Examination Guidelines: A complete evaluation includes B-mode imaging, spectral Doppler, color Doppler, and power Doppler as needed of all accessible portions of each vessel. Bilateral testing is considered an integral part of a complete examination. Limited examinations for reoccurring indications may be performed as noted.  +----------+--------+-----+---------------+----------+--------+ RIGHT     PSV cm/sRatioStenosis       Waveform  Comments +----------+--------+-----+---------------+----------+--------+ CFA Mid   85                          monophasic         +----------+--------+-----+---------------+----------+--------+ CFA Distal343          50-74% stenosismonophasic         +----------+--------+-----+---------------+----------+--------+ DFA       104                         monophasic         +----------+--------+-----+---------------+----------+--------+ SFA Prox  117                         monophasic         +----------+--------+-----+---------------+----------+--------+ SFA Mid   54                          monophasic         +----------+--------+-----+---------------+----------+--------+ SFA Distal80                            monophasic         +----------+--------+-----+---------------+----------+--------+ POP Prox  63                          monophasic         +----------+--------+-----+---------------+----------+--------+ POP Distal49                          monophasic          +----------+--------+-----+---------------+----------+--------+ PTA Prox  26                          monophasic         +----------+--------+-----+---------------+----------+--------+ PTA Distal26                          monophasic         +----------+--------+-----+---------------+----------+--------+ A focal velocity elevation of 343 cm/s was obtained at CFA / Distal ao bifemo with a VR of 4.1. Findings are characteristic of 50-74% stenosis.  Right Stent(s): +---------------+--------+--------+----------+--------+ Prox SFA       PSV cm/sStenosisWaveform  Comments +---------------+--------+--------+----------+--------+ Prox to Stent  110             monophasic         +---------------+--------+--------+----------+--------+ Proximal Stent 104             monophasic         +---------------+--------+--------+----------+--------+ Mid Stent      56              monophasic         +---------------+--------+--------+----------+--------+ Distal Stent   55              monophasic         +---------------+--------+--------+----------+--------+ Distal to Stent82              monophasic         +---------------+--------+--------+----------+--------+    +----------+--------+-----+--------+----------+--------+ LEFT      PSV cm/sRatioStenosisWaveform  Comments +----------+--------+-----+--------+----------+--------+ CFA Distal251                  biphasic           +----------+--------+-----+--------+----------+--------+ DFA       107                  biphasic           +----------+--------+-----+--------+----------+--------+ SFA Prox  119                  monophasic         +----------+--------+-----+--------+----------+--------+ SFA Mid   122                  monophasic         +----------+--------+-----+--------+----------+--------+ SFA Distal132                  monophasic          +----------+--------+-----+--------+----------+--------+ POP Prox  97                   monophasic         +----------+--------+-----+--------+----------+--------+ POP Distal44                   monophasic         +----------+--------+-----+--------+----------+--------+ ATA Distal42  monophasic         +----------+--------+-----+--------+----------+--------+ PTA Distal36                   monophasic         +----------+--------+-----+--------+----------+--------+  Left Stent(s): +---------------+--------+---------------+----------+--------+ Distal SFA     PSV cm/sStenosis       Waveform  Comments +---------------+--------+---------------+----------+--------+ Prox to Stent  83                     monophasic         +---------------+--------+---------------+----------+--------+ Proximal Stent 158                    monophasic         +---------------+--------+---------------+----------+--------+ Mid Stent      88                     monophasic         +---------------+--------+---------------+----------+--------+ Distal Stent   252     50-99% stenosismonophasic         +---------------+--------+---------------+----------+--------+ Distal to Stent114                    monophasic         +---------------+--------+---------------+----------+--------+    Summary: Right: 50-74% stenosis noted in the common femoral artery. Patent stent with no evidence of stenosis in the Proximal superficial femoral artery artery. Left: Stenosis is noted within the Distal superficial femoral artery stent.  See table(s) above for measurements and observations. Electronically signed by Maurita Havener MD on 01/31/2022 at 4:06:10 PM.    Final    VAS US ABI WITH/WO TBI  Result Date: 01/21/2022  LOWER EXTREMITY DOPPLER STUDY Patient Name:  Jeffrey Lupe JR.  Date of Exam:   01/21/2022 Medical Rec #: [id number]          Accession #:    [id number] Date of Birth:  11/05/1980          Patient Gender: M Patient Age:   40 years Exam Location:  [facility] Procedure:      VAS US ABI WITH/WO TBI Referring Phys: FALLON BROWN --------------------------------------------------------------------------------  Indications: Claudication, and peripheral artery disease. High Risk Factors: Hypertension, current smoker.  Vascular Interventions: 06/01/21: Aortobifemoral BPG with bilateral CFA/SFA                         endarterectomies;                         06/02/21: Right SFA endarterectomy/angioplasty;                          07/24/2021: Left Lower Extremity Angiogram Third order                         catheter placement. Angioplasty and Stent placement Left                         SFA and Popliteal Artery.                          08/07/2021: PTA and Stent placement Right SFA. Performing Technologist: Matthew Cravey RVT  Examination Guidelines: A complete evaluation includes at minimum, Doppler waveform signals and systolic blood pressure reading at the level of bilateral   brachial, anterior tibial, and posterior tibial arteries, when vessel segments are accessible. Bilateral testing is considered an integral part of a complete examination. Photoelectric Plethysmograph (PPG) waveforms and toe systolic pressure readings are included as required and additional duplex testing as needed. Limited examinations for reoccurring indications may be performed as noted.  ABI Findings: +---------+------------------+-----+----------+--------+ Right    Rt Pressure (mmHg)IndexWaveform  Comment  +---------+------------------+-----+----------+--------+ Brachial 117                                       +---------+------------------+-----+----------+--------+ ATA      58                0.50 monophasic         +---------+------------------+-----+----------+--------+ PTA      82                0.70 monophasic          +---------+------------------+-----+----------+--------+ PERO     70                0.60 monophasic         +---------+------------------+-----+----------+--------+ Great Toe0                 0.00                    +---------+------------------+-----+----------+--------+ +---------+------------------+-----+----------+-------+ Left     Lt Pressure (mmHg)IndexWaveform  Comment +---------+------------------+-----+----------+-------+ Brachial 108                                      +---------+------------------+-----+----------+-------+ ATA      69                0.59 monophasic        +---------+------------------+-----+----------+-------+ PTA      73                0.62 monophasic        +---------+------------------+-----+----------+-------+ Great Toe62                0.53                   +---------+------------------+-----+----------+-------+ +-------+-----------+-----------+------------+------------+ ABI/TBIToday's ABIToday's TBIPrevious ABIPrevious TBI +-------+-----------+-----------+------------+------------+ Right  0.70       0.00       0.91        0.78         +-------+-----------+-----------+------------+------------+ Left   0.62       0.53       0.92        0.98         +-------+-----------+-----------+------------+------------+ Bilateral ABIs appear decreased compared to prior study on 10/19/2021.  Summary: Right: Resting right ankle-brachial index indicates moderate right lower extremity arterial disease. The right toe-brachial index is abnormal. Left: Resting left ankle-brachial index indicates moderate left lower extremity arterial disease. The left toe-brachial index is abnormal. *See table(s) above for measurements and observations.  Electronically signed by [name] MD on 01/21/2022 at 5:12:23 PM.    Final      Assessment/Plan 1. Atherosclerotic peripheral vascular disease with intermittent claudication (HCC) Recommend:   The  patient has evidence of severe atherosclerotic changes of both lower extremities with rest pain that is associated with preulcerative changes and impending tissue loss of the left foot.  This represents a limb threatening  ischemia and places the patient at the risk for left limb loss.  The apparent acute change post angio is suggestive of thrombosis of the right common femoral area.  At this point I believe we have no choice but to move forward with surgery on an emergent basis given his neurologic change and the profound nature of his disease.  I have discussed this with the patient and his family.  We will move forward with redo femoral endarterectomy this evening.    2. Benign essential hypertension Continue antihypertensive medications as already ordered, these medications have been reviewed and there are no changes at this time.    3. Mixed hyperlipidemia Continue statin as ordered and reviewed, no changes at this time    4. Secondary osteoarthritis of hip Continue NSAID medications as already ordered, these medications have been reviewed and there are no changes at this time.   Continued activity and therapy was stressed.    5. Pain in both lower extremities Likely a combination of PAD and DJD   Will angios as described and continue current treatment for DJD     Chidera Dearcos, MD  02/12/2022 2:51 PM    

## 2022-02-12 NOTE — H&P (View-Only) (Signed)
MRN : [id number]  Jeffrey Grant. is a 41 y.o. (01-22-1981) male who presents with chief complaint of check circulation.  History of Present Illness:  Called to patient's bedside patient was unable to move his foot and described increasing pain of his entire leg.  Earlier today at the time of angiogram he was found to have profound stenosis of the right common femoral that threatened patency of his bypass graft.  His common femoral proximal SFA and origin of the profunda are all greater than 95% stenotic.  Current Meds  Medication Sig   amLODipine (NORVASC) 10 MG tablet Take 0.5 tablets (5 mg total) by mouth in the morning. (Patient taking differently: Take 10 mg by mouth in the morning.)   aspirin EC 81 MG EC tablet Take 1 tablet (81 mg total) by mouth daily. Swallow whole.   atorvastatin (LIPITOR) 80 MG tablet Take 1 tablet (80 mg total) by mouth daily.   cilostazol (PLETAL) 100 MG tablet Take 1 tablet (100 mg total) by mouth 2 (two) times daily.   clopidogrel (PLAVIX) 75 MG tablet Take 1 tablet (75 mg total) by mouth daily.   DULoxetine (CYMBALTA) 30 MG capsule Take 90 mg by mouth daily.   folic acid (FOLVITE) A999333 MCG tablet Take 400 mcg by mouth daily.   hydroxychloroquine (PLAQUENIL) 200 MG tablet Take 200 mg by mouth 2 (two) times daily.   lisinopril (ZESTRIL) 20 MG tablet Take 20 mg by mouth daily.   LYRICA 100 MG capsule Take 100 mg by mouth 3 (three) times daily.   oxyCODONE-acetaminophen (PERCOCET) 7.5-325 MG tablet Take 1 tablet by mouth every 6 (six) hours as needed for severe pain.    Past Medical History:  Diagnosis Date   Anxiety    Atherosclerotic PVD with intermittent claudication (HCC)    Back pain    Collagen vascular disease (HCC)    Dyspnea    Dysrhythmia    Elevated lipids    Hypertension    Nausea    Seropositive rheumatoid arthritis ([place])     Past Surgical History:  Procedure Laterality Date   AORTA - BILATERAL FEMORAL ARTERY  BYPASS GRAFT Bilateral 06/01/2021   Procedure: AORTA BIFEMORAL BYPASS GRAFT;  Surgeon: Katha Cabal, MD;  Location: [facility] ORS;  Service: Vascular;  Laterality: Bilateral;   COLONOSCOPY WITH PROPOFOL N/A 04/21/2018   Procedure: COLONOSCOPY WITH PROPOFOL;  Surgeon: Jonathon Bellows, MD;  Location: [facility] ENDOSCOPY;  Service: Gastroenterology;  Laterality: N/A;   ENDARTERECTOMY POPLITEAL Right 06/02/2021   Procedure: Right Femoral Endarterectomy and Patching Endoplasty;  Surgeon: Serafina Mitchell, MD;  Location: [facility] ORS;  Service: Vascular;  Laterality: Right;   ESOPHAGOGASTRODUODENOSCOPY (EGD) WITH PROPOFOL N/A 04/21/2018   Procedure: ESOPHAGOGASTRODUODENOSCOPY (EGD) WITH PROPOFOL;  Surgeon: Jonathon Bellows, MD;  Location: [facility] ENDOSCOPY;  Service: Gastroenterology;  Laterality: N/A;   HIP PINNING Right    LOWER EXTREMITY ANGIOGRAPHY N/A 05/29/2021   Procedure: LOWER EXTREMITY ANGIOGRAPHY;  Surgeon: Katha Cabal, MD;  Location: [place] [facility];  Service: Cardiovascular;  Laterality: N/A;   LOWER EXTREMITY ANGIOGRAPHY Left 07/24/2021   Procedure: LOWER EXTREMITY ANGIOGRAPHY;  Surgeon: Katha Cabal, MD;  Location: [place] [facility];  Service: Cardiovascular;  Laterality: Left;   LOWER EXTREMITY ANGIOGRAPHY Right 08/07/2021   Procedure: Lower Extremity Angiography;  Surgeon: Katha Cabal, MD;  Location: [place] [facility];  Service: Cardiovascular;  Laterality: Right;   TOTAL  HIP ARTHROPLASTY Right 10/22/2016   Procedure: TOTAL HIP ARTHROPLASTY ANTERIOR APPROACH;  Surgeon: Hessie Knows, MD;  Location: [facility] ORS;  Service: Orthopedics;  Laterality: Right;    Social History Social History   Tobacco Use   Smoking status: Former    Packs/day: 0.25    Years: 16.00    Total pack years: 4.00    Types: Cigarettes    Quit date: 06/04/2021    Years since quitting: 0.6   Smokeless tobacco: Never  Vaping Use   Vaping Use: Never used  Substance Use Topics   Alcohol use: No   Drug  use: Not Currently    Family History History reviewed. No pertinent family history.  No Known Allergies   REVIEW OF SYSTEMS (Negative unless checked)  Constitutional: [] Weight loss  [] Fever  [] Chills Cardiac: [] Chest pain   [] Chest pressure   [] Palpitations   [] Shortness of breath when laying flat   [] Shortness of breath with exertion. Vascular:  [x] Pain in legs with walking   [] Pain in legs at rest  [] History of DVT   [] Phlebitis   [] Swelling in legs   [] Varicose veins   [] Non-healing ulcers Pulmonary:   [] Uses home oxygen   [] Productive cough   [] Hemoptysis   [] Wheeze  [] COPD   [] Asthma Neurologic:  [] Dizziness   [] Seizures   [] History of stroke   [] History of TIA  [] Aphasia   [] Vissual changes   [] Weakness or numbness in arm   [] Weakness or numbness in leg Musculoskeletal:   [] Joint swelling   [] Joint pain   [] Low back pain Hematologic:  [] Easy bruising  [] Easy bleeding   [] Hypercoagulable state   [] Anemic Gastrointestinal:  [] Diarrhea   [] Vomiting  [] Gastroesophageal reflux/heartburn   [] Difficulty swallowing. Genitourinary:  [] Chronic kidney disease   [] Difficult urination  [] Frequent urination   [] Blood in urine Skin:  [] Rashes   [] Ulcers  Psychological:  [] History of anxiety   []  History of major depression.  Physical Examination  Vitals:   02/12/22 1200 02/12/22 1230 02/12/22 1300 02/12/22 1330  BP: 115/78 105/70 92/77 101/77  Pulse: 81 77 95 81  Resp: 12 11 14 11   Temp:      TempSrc:      SpO2: 100% 100% 100% 97%  Weight:      Height:       Body mass index is 25.84 kg/m. Gen: WD/WN, NAD Head: [place]/AT, No temporalis wasting.  Ear/Nose/Throat: Hearing grossly intact, nares w/o erythema or drainage Eyes: PER, EOMI, sclera nonicteric.  Neck: Supple, no masses.  No bruit or JVD.  Pulmonary:  Good air movement, no audible wheezing, no use of accessory muscles.  Cardiac: RRR, normal S1, S2, no Murmurs. Vascular: The foot is cool to the touch.  With the Doppler I am able  to get a faint signal over the right limb of the aortobifemoral graft.  There is a strong triphasic left signal., no open wounds Vessel Right Left  Radial Palpable Palpable  PT Not Palpable Not Palpable  DP Not Palpable Not Palpable  Gastrointestinal: soft, non-distended. No guarding/no peritoneal signs.  Musculoskeletal: M/S 5/5 throughout.  No visible deformity.  Neurologic: CN 2-12 intact. Pain and light touch intact in extremities.  Symmetrical.  Speech is fluent. Motor exam as listed above. Psychiatric: Judgment intact, Mood & affect appropriate for pt's clinical situation. Dermatologic: No rashes or ulcers noted.  No changes consistent with cellulitis.   CBC Lab Results  Component Value Date   WBC 10.2 07/12/2021   HGB 11.4 (L) 07/12/2021  HCT 35.1 (L) 07/12/2021   MCV 90.9 07/12/2021   PLT 387 07/12/2021    BMET    Component Value Date/Time   NA 137 07/12/2021 0856   NA 140 03/30/2018 1404   K 4.2 07/12/2021 0856   CL 97 (L) 07/12/2021 0856   CO2 28 07/12/2021 0856   GLUCOSE 185 (H) 07/12/2021 0856   BUN 13 02/12/2022 0919   BUN 9 03/30/2018 1404   CREATININE 0.96 02/12/2022 0919   CALCIUM 10.0 07/12/2021 0856   GFRNONAA >60 02/12/2022 0919   GFRAA 129 03/30/2018 1404   Estimated Creatinine Clearance: 101.3 mL/min (by C-G formula based on SCr of 0.96 mg/dL).  COAG Lab Results  Component Value Date   INR 0.9 06/01/2021   INR 1.0 05/29/2021   INR 0.91 10/09/2016    Radiology PERIPHERAL VASCULAR CATHETERIZATION  Result Date: 02/12/2022 See surgical note for result.  VAS [place] LOWER EXTREMITY ARTERIAL DUPLEX  Result Date: 01/31/2022 LOWER EXTREMITY ARTERIAL DUPLEX STUDY Patient Name:  Shedric Iadarola.  Date of Exam:   01/21/2022 Medical Rec #: [id number]          Accession #:    [id number] Date of Birth: 01/29/1981          Patient Gender: M Patient Age:   15 years Exam Location:  [facility] Procedure:      VAS [place] LOWER EXTREMITY ARTERIAL DUPLEX  Referring Phys: Eulogio Ditch --------------------------------------------------------------------------------  Indications: Peripheral artery disease. High Risk Factors: Current smoker.  Vascular Interventions: 06/01/21: Aortobifemoral BPG with bilateral CFA/SFA                         endarterectomies;                         06/02/21: Right SFA endarterectomy/angioplasty;. Current ABI:            Right: 0.70, Left : 0.62 Performing Technologist: Delorise Shiner RVT  Examination Guidelines: A complete evaluation includes B-mode imaging, spectral Doppler, color Doppler, and power Doppler as needed of all accessible portions of each vessel. Bilateral testing is considered an integral part of a complete examination. Limited examinations for reoccurring indications may be performed as noted.  +----------+--------+-----+---------------+----------+--------+ RIGHT     PSV cm/sRatioStenosis       Waveform  Comments +----------+--------+-----+---------------+----------+--------+ CFA Mid   85                          monophasic         +----------+--------+-----+---------------+----------+--------+ CFA Distal343          50-74% stenosismonophasic         +----------+--------+-----+---------------+----------+--------+ DFA       104                         monophasic         +----------+--------+-----+---------------+----------+--------+ SFA Prox  117                         monophasic         +----------+--------+-----+---------------+----------+--------+ SFA Mid   54                          monophasic         +----------+--------+-----+---------------+----------+--------+ SFA Distal80  monophasic         +----------+--------+-----+---------------+----------+--------+ POP Prox  63                          monophasic         +----------+--------+-----+---------------+----------+--------+ POP Distal49                          monophasic          +----------+--------+-----+---------------+----------+--------+ PTA Prox  26                          monophasic         +----------+--------+-----+---------------+----------+--------+ PTA Distal26                          monophasic         +----------+--------+-----+---------------+----------+--------+ A focal velocity elevation of 343 cm/s was obtained at CFA / Distal ao bifemo with a VR of 4.1. Findings are characteristic of 50-74% stenosis.  Right Stent(s): +---------------+--------+--------+----------+--------+ Prox SFA       PSV cm/sStenosisWaveform  Comments +---------------+--------+--------+----------+--------+ Prox to Stent  110             monophasic         +---------------+--------+--------+----------+--------+ Proximal Stent 104             monophasic         +---------------+--------+--------+----------+--------+ Mid Stent      56              monophasic         +---------------+--------+--------+----------+--------+ Distal Stent   55              monophasic         +---------------+--------+--------+----------+--------+ Distal to Stent82              monophasic         +---------------+--------+--------+----------+--------+    +----------+--------+-----+--------+----------+--------+ LEFT      PSV cm/sRatioStenosisWaveform  Comments +----------+--------+-----+--------+----------+--------+ CFA Distal251                  biphasic           +----------+--------+-----+--------+----------+--------+ DFA       107                  biphasic           +----------+--------+-----+--------+----------+--------+ SFA Prox  119                  monophasic         +----------+--------+-----+--------+----------+--------+ SFA Mid   122                  monophasic         +----------+--------+-----+--------+----------+--------+ SFA Distal132                  monophasic          +----------+--------+-----+--------+----------+--------+ POP Prox  97                   monophasic         +----------+--------+-----+--------+----------+--------+ POP Distal44                   monophasic         +----------+--------+-----+--------+----------+--------+ ATA Distal42  monophasic         +----------+--------+-----+--------+----------+--------+ PTA Distal36                   monophasic         +----------+--------+-----+--------+----------+--------+  Left Stent(s): +---------------+--------+---------------+----------+--------+ Distal SFA     PSV cm/sStenosis       Waveform  Comments +---------------+--------+---------------+----------+--------+ Prox to Stent  83                     monophasic         +---------------+--------+---------------+----------+--------+ Proximal Stent 158                    monophasic         +---------------+--------+---------------+----------+--------+ Mid Stent      88                     monophasic         +---------------+--------+---------------+----------+--------+ Distal Stent   252     50-99% stenosismonophasic         +---------------+--------+---------------+----------+--------+ Distal to Stent114                    monophasic         +---------------+--------+---------------+----------+--------+    Summary: Right: 50-74% stenosis noted in the common femoral artery. Patent stent with no evidence of stenosis in the Proximal superficial femoral artery artery. Left: Stenosis is noted within the Distal superficial femoral artery stent.  See table(s) above for measurements and observations. Electronically signed by [name] MD on 01/31/2022 at 4:06:10 PM.    Final    VAS [place] ABI WITH/WO TBI  Result Date: 01/21/2022  LOWER EXTREMITY DOPPLER STUDY Patient Name:  Abdou Czajkowski JR.  Date of Exam:   01/21/2022 Medical Rec #: [id number]          Accession #:    [id number] Date of Birth:  07-Jun-1981          Patient Gender: M Patient Age:   14 years Exam Location:  [facility] Procedure:      VAS [place] ABI WITH/WO TBI Referring Phys: Eulogio Ditch --------------------------------------------------------------------------------  Indications: Claudication, and peripheral artery disease. High Risk Factors: Hypertension, current smoker.  Vascular Interventions: 06/01/21: Aortobifemoral BPG with bilateral CFA/SFA                         endarterectomies;                         06/02/21: Right SFA endarterectomy/angioplasty;                          07/24/2021: Left Lower Extremity Angiogram Third order                         catheter placement. Angioplasty and Stent placement Left                         SFA and Popliteal Artery.                          08/07/2021: PTA and Stent placement Right SFA. Performing Technologist: Delorise Shiner RVT  Examination Guidelines: A complete evaluation includes at minimum, Doppler waveform signals and systolic blood pressure reading at the level of bilateral  brachial, anterior tibial, and posterior tibial arteries, when vessel segments are accessible. Bilateral testing is considered an integral part of a complete examination. Photoelectric Plethysmograph (PPG) waveforms and toe systolic pressure readings are included as required and additional duplex testing as needed. Limited examinations for reoccurring indications may be performed as noted.  ABI Findings: +---------+------------------+-----+----------+--------+ Right    Rt Pressure (mmHg)IndexWaveform  Comment  +---------+------------------+-----+----------+--------+ Brachial 117                                       +---------+------------------+-----+----------+--------+ ATA      58                0.50 monophasic         +---------+------------------+-----+----------+--------+ PTA      82                0.70 monophasic          +---------+------------------+-----+----------+--------+ PERO     70                0.60 monophasic         +---------+------------------+-----+----------+--------+ Great Toe0                 0.00                    +---------+------------------+-----+----------+--------+ +---------+------------------+-----+----------+-------+ Left     Lt Pressure (mmHg)IndexWaveform  Comment +---------+------------------+-----+----------+-------+ Brachial 108                                      +---------+------------------+-----+----------+-------+ ATA      69                0.59 monophasic        +---------+------------------+-----+----------+-------+ PTA      73                0.62 monophasic        +---------+------------------+-----+----------+-------+ Great Toe62                0.53                   +---------+------------------+-----+----------+-------+ +-------+-----------+-----------+------------+------------+ ABI/TBIToday's ABIToday's TBIPrevious ABIPrevious TBI +-------+-----------+-----------+------------+------------+ Right  0.70       0.00       0.91        0.78         +-------+-----------+-----------+------------+------------+ Left   0.62       0.53       0.92        0.98         +-------+-----------+-----------+------------+------------+ Bilateral ABIs appear decreased compared to prior study on 10/19/2021.  Summary: Right: Resting right ankle-brachial index indicates moderate right lower extremity arterial disease. The right toe-brachial index is abnormal. Left: Resting left ankle-brachial index indicates moderate left lower extremity arterial disease. The left toe-brachial index is abnormal. *See table(s) above for measurements and observations.  Electronically signed by [name] MD on 01/21/2022 at 5:12:23 PM.    Final      Assessment/Plan 1. Atherosclerotic peripheral vascular disease with intermittent claudication (HCC) Recommend:   The  patient has evidence of severe atherosclerotic changes of both lower extremities with rest pain that is associated with preulcerative changes and impending tissue loss of the left foot.  This represents a limb threatening  ischemia and places the patient at the risk for left limb loss.  The apparent acute change post angio is suggestive of thrombosis of the right common femoral area.  At this point I believe we have no choice but to move forward with surgery on an emergent basis given his neurologic change and the profound nature of his disease.  I have discussed this with the patient and his family.  We will move forward with redo femoral endarterectomy this evening.    2. Benign essential hypertension Continue antihypertensive medications as already ordered, these medications have been reviewed and there are no changes at this time.    3. Mixed hyperlipidemia Continue statin as ordered and reviewed, no changes at this time    4. Secondary osteoarthritis of hip Continue NSAID medications as already ordered, these medications have been reviewed and there are no changes at this time.   Continued activity and therapy was stressed.    5. Pain in both lower extremities Likely a combination of PAD and DJD   Will angios as described and continue current treatment for DJD     [name], MD  02/12/2022 2:51 PM

## 2022-02-12 NOTE — Op Note (Signed)
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  Abdominal aortogram  2.  Selective injection of the left lower extremity third order catheter placement  3.  Ultrasound-guided access to the right common femoral artery  4.  Manual pressure held    Anesthesia: Conscious sedation was administered by the interventional radiology RN under my direct supervision. IV Versed plus fentanyl were utilized. Continuous ECG, pulse oximetry and blood pressure was monitored throughout the entire procedure.  Conscious sedation was administered for a total of 47 minutes.  Sheath: 6 French 11 cm Pinnacle sheath retrograde right common femoral  Contrast: 75 cc   Fluoroscopy Time: 5.7 minutes  Indications:  The patient presents to [facility] with atherosclerotic occlusive disease bilateral lower extremities with worsening pain in his lower extremities particularly the right.  Pedal pulses are nonpalpable bilaterally suggesting hemodynamically significant atherosclerotic occlusive disease patient's noninvasive studies have also deteriorated.  The risks and benefits as well as alternative therapies for lower extremity revascularization are reviewed with the patient all questions are answered the patient agrees to proceed.  The patient is therefore undergoing angiography with the hope for intervention for limb salvage.   Procedure:  Jeffrey Grantis a 41 y.o. male who was identified and appropriate procedural time out was performed.  The patient was then placed supine on the table and prepped and draped in the usual sterile fashion.  Ultrasound was  used to evaluate the right common femoral artery.  It was echolucent and pulsatile indicating it is patent .  An ultrasound image was acquired for the permanent record.  A micropuncture needle was used to access the right common femoral artery under direct ultrasound guidance.  The microwire was then advanced under fluoroscopic guidance without difficulty followed by the micro-sheath.  A 0.035 J wire was advanced without resistance and a 5Fr sheath was placed.    Pigtail catheter was then advanced to the level of T12 and AP projection of the aorta was obtained. Pigtail catheter was then repositioned to above the bifurcation and LAO and RAO view of the pelvis was obtained.  Using a V S1 catheter to seat on the bifurcation of the graft I then advanced a prograde catheter through the V S1 and the prograde catheter was positioned in the distal external iliac artery.  LAO of the left groin was then obtained. Wire was reintroduced and the prograde negotiated into the SFA and the catheter was advanced into the SFA. Distal runoff was then performed.  The right lower extremity runoff was then obtained by hand-injection through the right femoral sheath.  After review of the images the catheter was removed over wire and an RAO view of the groin was obtained. StarClose device was not able to be used secondary to the extensive occlusive disease identified in the right common femoral and proximal SFA.   Findings:   Aortogram: The abdominal aorta is opacified with a bolus injection contrast.  Single renal arteries are noted bilaterally with normal nephrograms.  No evidence of hemodynamically significant renal artery stenosis.  There are no hemodynamically significant stenoses identified within the aorta at the level of the renal arteries.  The aortobifemoral bypass graft is identified the proximal anastomosis is widely patent.  There appears to be a high-grade greater than 70% stenosis at the origin of the left limb of the  graft and a moderate 50 to 60% stenosis at the origin of the right limb of the graft.  Left lower Extremity: The distal anastomosis, left common femoral, profunda femoris arteries are widely patent.  The superficial femoral artery is patent in its proximal half previously placed stents are noted beginning at Hunter's canal and extending to the mid popliteal at the level of the femoral condyles.  At this distal edge of the stent there is a greater than 80% stenosis of the popliteal extending over 20 to 30 mm.  Trifurcation appears to be widely patent.  Distal runoff appears to be patent to the foot.                Right lower extremity: The right limb of the aortobifem femoral bypass graft distally demonstrates a very sluggish flow there appears to be a string sign throughout the entire right common femoral artery.  There is a subtotal occlusion of the profunda femoris at its origin but distal to this it appears widely patent.  There is a string sign from the origin of the SFA extending approximately 6 cm to the previously placed stent in the SFA.  Within the stent itself is patent and appears to return to a more normal caliber.  Distal to the SFA stent through Hunter's canal and the popliteal artery it appears to be patent without hemodynamically significant stenosis but the contrast is quite faint and I am unable to determine the extent of occlusive disease within the trifurcation although it does appear that the tibioperoneal trunk peroneal and posterior tibial are patent    SUMMARY: Based on these images no intervention is performed at this time.  Unfortunately, the patient will need extensive reconstruction of the right groin both for limb salvage and prevent loss of the right limb of the bypass graft.  At that time kissing stents can be placed in the bifurcation of the graft and the left distal popliteal lesion can be addressed.    Disposition: Patient was taken to the recovery room in stable condition  having tolerated the procedure well.  Jeffrey Grant 02/12/2022,10:41 AM

## 2022-02-12 NOTE — Consult Note (Signed)
[facility] Cardiology Consultation Note  Patient ID: Jeffrey Flinchbaugh., MRN: [id number], DOB/AGE: 42-Sep-1982 42 y.o. Admit date: 02/12/2022   Date of Consult: 02/12/2022 Primary Physician: Leanna Sato, MD Primary Cardiologist: Gwen Pounds  Chief Complaint: No chief complaint on file.  Reason for Consult:  Peripheral vascular disease  HPI: 41 y.o. male with known peripheral vascular disease status post previous intervention of his right lower extremity for which he has had continued significant progression of symptoms possible worsening vascular occlusion.  He has had some significant tingling and lower extremity issues and the patient has been on appropriate medication management.  This includes lisinopril antiplatelet medication management.  The patient is intolerant using and a statin therapy.  The patient does have a tobacco abuse significant history which is contributing to his current issues.  Currently it does appear that he needs further intervention with his right lower extremity femoral bypass.  The patient has had a stress test in December 2022 showing normal myocardial perfusion with no evidence of significant new symptoms of angina or shortness of breath.  The patient also had an echocardiogram showing normal LV systolic function and no evidence of significant LV systolic dysfunction or valvular heart disease needing further intervention.  Therefore he is currently at lowest risk possible for cardiovascular complication with surgical intervention of his peripheral vascular disease.  Past Medical History:  Diagnosis Date   Anxiety    Atherosclerotic PVD with intermittent claudication (HCC)    Back pain    Collagen vascular disease (HCC)    Dyspnea    Dysrhythmia    Elevated lipids    Hypertension    Nausea    Seropositive rheumatoid arthritis (HCC)       Surgical History:  Past Surgical History:  Procedure Laterality Date   AORTA - BILATERAL FEMORAL ARTERY BYPASS GRAFT  Bilateral 06/01/2021   Procedure: AORTA BIFEMORAL BYPASS GRAFT;  Surgeon: Renford Dills, MD;  Location: [facility] ORS;  Service: Vascular;  Laterality: Bilateral;   COLONOSCOPY WITH PROPOFOL N/A 04/21/2018   Procedure: COLONOSCOPY WITH PROPOFOL;  Surgeon: [name], MD;  Location: [facility] ENDOSCOPY;  Service: Gastroenterology;  Laterality: N/A;   ENDARTERECTOMY POPLITEAL Right 06/02/2021   Procedure: Right Femoral Endarterectomy and Patching Endoplasty;  Surgeon: Nada Libman, MD;  Location: [facility] ORS;  Service: Vascular;  Laterality: Right;   ESOPHAGOGASTRODUODENOSCOPY (EGD) WITH PROPOFOL N/A 04/21/2018   Procedure: ESOPHAGOGASTRODUODENOSCOPY (EGD) WITH PROPOFOL;  Surgeon: [name], MD;  Location: [facility] ENDOSCOPY;  Service: Gastroenterology;  Laterality: N/A;   HIP PINNING Right    LOWER EXTREMITY ANGIOGRAPHY N/A 05/29/2021   Procedure: LOWER EXTREMITY ANGIOGRAPHY;  Surgeon: Renford Dills, MD;  Location: [facility] INVASIVE CV LAB;  Service: Cardiovascular;  Laterality: N/A;   LOWER EXTREMITY ANGIOGRAPHY Left 07/24/2021   Procedure: LOWER EXTREMITY ANGIOGRAPHY;  Surgeon: Renford Dills, MD;  Location: [facility] INVASIVE CV LAB;  Service: Cardiovascular;  Laterality: Left;   LOWER EXTREMITY ANGIOGRAPHY Right 08/07/2021   Procedure: Lower Extremity Angiography;  Surgeon: Renford Dills, MD;  Location: [facility] INVASIVE CV LAB;  Service: Cardiovascular;  Laterality: Right;   TOTAL HIP ARTHROPLASTY Right 10/22/2016   Procedure: TOTAL HIP ARTHROPLASTY ANTERIOR APPROACH;  Surgeon: Kennedy Bucker, MD;  Location: [facility] ORS;  Service: Orthopedics;  Laterality: Right;     Home Meds: Prior to Admission medications   Medication Sig Start Date End Date Taking? Authorizing Provider  amLODipine (NORVASC) 10 MG tablet Take 0.5 tablets (5 mg total) by mouth in the morning. Patient taking differently:  Take 10 mg by mouth in the morning. 06/05/21  Yes Wouk, Wilfred Curtis, MD  aspirin EC 81 MG EC tablet Take 1 tablet  (81 mg total) by mouth daily. Swallow whole. 06/06/21  Yes Wouk, Wilfred Curtis, MD  atorvastatin (LIPITOR) 80 MG tablet Take 1 tablet (80 mg total) by mouth daily. 06/06/21  Yes Wouk, Wilfred Curtis, MD  cilostazol (PLETAL) 100 MG tablet Take 1 tablet (100 mg total) by mouth 2 (two) times daily. 01/28/22  Yes Georgiana Spinner, NP  clopidogrel (PLAVIX) 75 MG tablet Take 1 tablet (75 mg total) by mouth daily. 06/06/21  Yes Wouk, Wilfred Curtis, MD  DULoxetine (CYMBALTA) 30 MG capsule Take 90 mg by mouth daily. 01/09/22  Yes [provider]  folic acid (FOLVITE) 400 MCG tablet Take 400 mcg by mouth daily. 12/27/21  Yes [provider]  hydroxychloroquine (PLAQUENIL) 200 MG tablet Take 200 mg by mouth 2 (two) times daily. 08/20/21  Yes [provider]  lisinopril (ZESTRIL) 20 MG tablet Take 20 mg by mouth daily.   Yes [provider]  LYRICA 100 MG capsule Take 100 mg by mouth 3 (three) times daily. 08/16/21  Yes [provider]  oxyCODONE-acetaminophen (PERCOCET) 7.5-325 MG tablet Take 1 tablet by mouth every 6 (six) hours as needed for severe pain. 08/06/21  Yes Georgiana Spinner, NP  acetaminophen (TYLENOL) 500 MG tablet Take 1,000 mg by mouth every 6 (six) hours as needed (pain.).    [provider]  amitriptyline (ELAVIL) 50 MG tablet Take 50 mg by mouth at bedtime. Patient not taking: Reported on 09/19/2021 07/13/21   [provider]  cyclobenzaprine (FLEXERIL) 10 MG tablet Take 10 mg by mouth every 8 (eight) hours as needed for muscle spasms. Patient not taking: Reported on 07/18/2021 05/24/21   [provider]  methocarbamol (ROBAXIN) 500 MG tablet Take 1 tablet (500 mg total) by mouth every 6 (six) hours as needed for muscle spasms. Patient not taking: Reported on 01/21/2022 07/12/21   Delton Prairie, MD  NEURONTIN 300 MG capsule Take 1 capsule (300 mg total) by mouth 3 (three) times daily. Patient not taking: Reported on 01/21/2022 07/04/21    Georgiana Spinner, NP  nicotine (NICODERM CQ - DOSED IN MG/24 HOURS) 21 mg/24hr patch Place 21 mg onto the skin daily. Patient not taking: Reported on 07/18/2021    [provider]  nicotine polacrilex (NICORETTE) 4 MG gum Take 4 mg by mouth as needed for smoking cessation. Patient not taking: Reported on 07/18/2021    [provider]  ondansetron (ZOFRAN) 8 MG tablet Take 8 mg by mouth every 8 (eight) hours as needed. Patient not taking: Reported on 07/18/2021 06/13/21   [provider]  sulfamethoxazole-trimethoprim (BACTRIM DS) 800-160 MG tablet Take 1 tablet by mouth 2 (two) times daily. Patient not taking: Reported on 01/21/2022 10/19/21   Georgiana Spinner, NP  Vitamin D, Ergocalciferol, (DRISDOL) 50000 units CAPS capsule Take 50,000 Units by mouth every Thursday. Patient not taking: Reported on 09/19/2021    [provider]    Inpatient Medications:   fentaNYL       heparin sodium (porcine)       midazolam       sodium chloride flush  3 mL Intravenous Q12H    sodium chloride 1,000 mL (02/12/22 0928)   sodium chloride     sodium chloride      Allergies: No Known Allergies  Social History   Socioeconomic History  Marital status: Married    Spouse name: Not on file   Number of children: Not on file   Years of education: Not on file   Highest education level: Not on file  Occupational History   Not on file  Tobacco Use   Smoking status: Former    Packs/day: 0.25    Years: 16.00    Total pack years: 4.00    Types: Cigarettes    Quit date: 06/04/2021    Years since quitting: 0.6   Smokeless tobacco: Never  Vaping Use   Vaping Use: Never used  Substance and Sexual Activity   Alcohol use: No   Drug use: Not Currently   Sexual activity: Yes    Partners: Female  Other Topics Concern   Not on file  Social History Narrative   Not on file   Social Determinants of Health   Financial Resource Strain: Not on file  Food Insecurity: Not on  file  Transportation Needs: Not on file  Physical Activity: Not on file  Stress: Not on file  Social Connections: Not on file  Intimate Partner Violence: Not on file     History reviewed. No pertinent family history.   Review of Systems Positive for leg pain Negative for: General:  chills, fever, night sweats or weight changes.  Cardiovascular: PND orthopnea syncope dizziness  Dermatological skin lesions rashes Respiratory: Cough congestion Urologic: Frequent urination urination at night and hematuria Abdominal: negative for nausea, vomiting, diarrhea, bright red blood per rectum, melena, or hematemesis Neurologic: negative for visual changes, and/or hearing changes  All other systems reviewed and are otherwise negative except as noted above.  Labs: No results for input(s): "CKTOTAL", "CKMB", "TROPONINI" in the last 72 hours. Lab Results  Component Value Date   WBC 10.2 07/12/2021   HGB 11.4 (L) 07/12/2021   HCT 35.1 (L) 07/12/2021   MCV 90.9 07/12/2021   PLT 387 07/12/2021    Recent Labs  Lab 02/12/22 0919  BUN 13  CREATININE 0.96   No results found for: "CHOL", "HDL", "LDLCALC", "TRIG" No results found for: "DDIMER"  Radiology/Studies:  PERIPHERAL VASCULAR CATHETERIZATION  Result Date: 02/12/2022 See surgical note for result.  VAS [place] LOWER EXTREMITY ARTERIAL DUPLEX  Result Date: 01/31/2022 LOWER EXTREMITY ARTERIAL DUPLEX STUDY Patient Name:  Jeffrey Eline.  Date of Exam:   01/21/2022 Medical Rec #: [id number]          Accession #:    [id number] Date of Birth: [birth year]          Patient Gender: M Patient Age:   35 years Exam Location:  [facility] Procedure:      VAS [place] LOWER EXTREMITY ARTERIAL DUPLEX Referring Phys: Sheppard Plumber --------------------------------------------------------------------------------  Indications: Peripheral artery disease. High Risk Factors: Current smoker.  Vascular Interventions: 06/01/21: Aortobifemoral BPG with bilateral  CFA/SFA                         endarterectomies;                         06/02/21: Right SFA endarterectomy/angioplasty;. Current ABI:            Right: 0.70, Left : 0.62 Performing Technologist: Hardie Lora RVT  Examination Guidelines: A complete evaluation includes B-mode imaging, spectral Doppler, color Doppler, and power Doppler as needed of all accessible portions of each vessel. Bilateral testing is considered an integral part of a complete examination.  Limited examinations for reoccurring indications may be performed as noted.  +----------+--------+-----+---------------+----------+--------+ RIGHT     PSV cm/sRatioStenosis       Waveform  Comments +----------+--------+-----+---------------+----------+--------+ CFA Mid   85                          monophasic         +----------+--------+-----+---------------+----------+--------+ CFA Distal343          50-74% stenosismonophasic         +----------+--------+-----+---------------+----------+--------+ DFA       104                         monophasic         +----------+--------+-----+---------------+----------+--------+ SFA Prox  117                         monophasic         +----------+--------+-----+---------------+----------+--------+ SFA Mid   54                          monophasic         +----------+--------+-----+---------------+----------+--------+ SFA Distal80                          monophasic         +----------+--------+-----+---------------+----------+--------+ POP Prox  63                          monophasic         +----------+--------+-----+---------------+----------+--------+ POP Distal49                          monophasic         +----------+--------+-----+---------------+----------+--------+ PTA Prox  26                          monophasic         +----------+--------+-----+---------------+----------+--------+ PTA Distal26                          monophasic          +----------+--------+-----+---------------+----------+--------+ A focal velocity elevation of 343 cm/s was obtained at CFA / Distal ao bifemo with a VR of 4.1. Findings are characteristic of 50-74% stenosis.  Right Stent(s): +---------------+--------+--------+----------+--------+ Prox SFA       PSV cm/sStenosisWaveform  Comments +---------------+--------+--------+----------+--------+ Prox to Stent  110             monophasic         +---------------+--------+--------+----------+--------+ Proximal Stent 104             monophasic         +---------------+--------+--------+----------+--------+ Mid Stent      56              monophasic         +---------------+--------+--------+----------+--------+ Distal Stent   55              monophasic         +---------------+--------+--------+----------+--------+ Distal to Stent82              monophasic         +---------------+--------+--------+----------+--------+    +----------+--------+-----+--------+----------+--------+ LEFT      PSV cm/sRatioStenosisWaveform  Comments +----------+--------+-----+--------+----------+--------+ CFA [id number]  biphasic           +----------+--------+-----+--------+----------+--------+ DFA       107                  biphasic           +----------+--------+-----+--------+----------+--------+ SFA Prox  119                  monophasic         +----------+--------+-----+--------+----------+--------+ SFA Mid   122                  monophasic         +----------+--------+-----+--------+----------+--------+ SFA Distal132                  monophasic         +----------+--------+-----+--------+----------+--------+ POP Prox  97                   monophasic         +----------+--------+-----+--------+----------+--------+ POP Distal44                   monophasic         +----------+--------+-----+--------+----------+--------+ ATA Distal42                    monophasic         +----------+--------+-----+--------+----------+--------+ PTA Distal36                   monophasic         +----------+--------+-----+--------+----------+--------+  Left Stent(s): +---------------+--------+---------------+----------+--------+ Distal SFA     PSV cm/sStenosis       Waveform  Comments +---------------+--------+---------------+----------+--------+ Prox to Stent  83                     monophasic         +---------------+--------+---------------+----------+--------+ Proximal Stent 158                    monophasic         +---------------+--------+---------------+----------+--------+ Mid Stent      88                     monophasic         +---------------+--------+---------------+----------+--------+ Distal Stent   252     50-99% stenosismonophasic         +---------------+--------+---------------+----------+--------+ Distal to Stent114                    monophasic         +---------------+--------+---------------+----------+--------+    Summary: Right: 50-74% stenosis noted in the common femoral artery. Patent stent with no evidence of stenosis in the Proximal superficial femoral artery artery. Left: Stenosis is noted within the Distal superficial femoral artery stent.  See table(s) above for measurements and observations. Electronically signed by [name] MD on 01/31/2022 at 4:06:10 PM.    Final    VAS [place] ABI WITH/WO TBI  Result Date: 01/21/2022  LOWER EXTREMITY DOPPLER STUDY Patient Name:  Jeffrey Waldorf JR.  Date of Exam:   01/21/2022 Medical Rec #: [id number]          Accession #:    [id number] Date of Birth: [birth year]          Patient Gender: M Patient Age:   16 years Exam Location:  [facility] Procedure:      VAS [place] ABI WITH/WO TBI Referring Phys: Sheppard Plumber --------------------------------------------------------------------------------  Indications:  Claudication, and peripheral artery disease.  High Risk Factors: Hypertension, current smoker.  Vascular Interventions: 06/01/21: Aortobifemoral BPG with bilateral CFA/SFA                         endarterectomies;                         06/02/21: Right SFA endarterectomy/angioplasty;                          07/24/2021: Left Lower Extremity Angiogram Third order                         catheter placement. Angioplasty and Stent placement Left                         SFA and Popliteal Artery.                          08/07/2021: PTA and Stent placement Right SFA. Performing Technologist: Hardie Lora RVT  Examination Guidelines: A complete evaluation includes at minimum, Doppler waveform signals and systolic blood pressure reading at the level of bilateral brachial, anterior tibial, and posterior tibial arteries, when vessel segments are accessible. Bilateral testing is considered an integral part of a complete examination. Photoelectric Plethysmograph (PPG) waveforms and toe systolic pressure readings are included as required and additional duplex testing as needed. Limited examinations for reoccurring indications may be performed as noted.  ABI Findings: +---------+------------------+-----+----------+--------+ Right    Rt Pressure (mmHg)IndexWaveform  Comment  +---------+------------------+-----+----------+--------+ Brachial 117                                       +---------+------------------+-----+----------+--------+ ATA      58                0.50 monophasic         +---------+------------------+-----+----------+--------+ PTA      82                0.70 monophasic         +---------+------------------+-----+----------+--------+ PERO     70                0.60 monophasic         +---------+------------------+-----+----------+--------+ Great Toe0                 0.00                    +---------+------------------+-----+----------+--------+ +---------+------------------+-----+----------+-------+ Left     Lt Pressure  (mmHg)IndexWaveform  Comment +---------+------------------+-----+----------+-------+ Brachial 108                                      +---------+------------------+-----+----------+-------+ ATA      69                0.59 monophasic        +---------+------------------+-----+----------+-------+ PTA      73                0.62 monophasic        +---------+------------------+-----+----------+-------+ Anne Hahn  0.53                   +---------+------------------+-----+----------+-------+ +-------+-----------+-----------+------------+------------+ ABI/TBIToday's ABIToday's TBIPrevious ABIPrevious TBI +-------+-----------+-----------+------------+------------+ Right  0.70       0.00       0.91        0.78         +-------+-----------+-----------+------------+------------+ Left   0.62       0.53       0.92        0.98         +-------+-----------+-----------+------------+------------+ Bilateral ABIs appear decreased compared to prior study on 10/19/2021.  Summary: Right: Resting right ankle-brachial index indicates moderate right lower extremity arterial disease. The right toe-brachial index is abnormal. Left: Resting left ankle-brachial index indicates moderate left lower extremity arterial disease. The left toe-brachial index is abnormal. *See table(s) above for measurements and observations.  Electronically signed by [name] MD on 01/21/2022 at 5:12:23 PM.    Final     EKG: Normal sinus rhythm  Weights: Filed Weights   02/12/22 0910  Weight: 79.4 kg     Physical Exam: Blood pressure 115/78, pulse 81, temperature 98.3 F (36.8 C), temperature source Oral, resp. rate 12, height 5\' 9"  (1.753 m), weight 79.4 kg, SpO2 100 %. Body mass index is 25.84 kg/m. General: Well developed, well nourished, in no acute distress. Head eyes ears nose throat: Normocephalic, atraumatic, sclera non-icteric, no xanthomas, nares are without discharge. No  apparent thyromegaly and/or mass  Lungs: Normal respiratory effort.  no wheezes, no rales, no rhonchi.  Heart: RRR with normal S1 S2. no murmur gallop, no rub, PMI is normal size and placement, carotid upstroke normal without bruit, jugular venous pressure is normal Abdomen: Soft, non-tender, non-distended with normoactive bowel sounds. No hepatomegaly. No rebound/guarding. No obvious abdominal masses. Abdominal aorta is normal size without bruit Extremities: No edema. no cyanosis, no clubbing, no ulcers  Peripheral : 2+ bilateral upper extremity pulses, 1+ bilateral femoral pulses, 0-1+ bilateral dorsal pedal pulse Neuro: Alert and oriented. No facial asymmetry. No focal deficit. Moves all extremities spontaneously. Musculoskeletal: Normal muscle tone without kyphosis Psych:  Responds to questions appropriately with a normal affect.    Assessment: 41 year old male with significant peripheral vascular disease status post aortobifem bypass with significant worsening lower extremity claudication type symptoms needing further intervention currently at lowest risk possible from cardiovascular standpoint on appropriate medication management without evidence of anginal symptoms or recent concerns from cardiovascular standpoint.  Plan: 1.  Continue hypertension control with lisinopril 2.  Continue abstaining from tobacco abuse which we have discussed at length 3.  Antiplatelet medication management for further risk reduction in cardiovascular event 4.  No further cardiac diagnostics necessary at this time due to recent cardiac diagnostics showing no evidence of heart failure or myocardial ischemia 5.  Okay for proceeding to surgical intervention of the right lower extremity without restriction at this time  Signed, 45 M.D. [facility] 02/12/2022, 12:43 PM

## 2022-02-12 NOTE — Interval H&P Note (Signed)
History and Physical Interval Note:  02/12/2022 2:55 PM  Jeffrey Grant.  has presented today for surgery, with the diagnosis of LLE Angio   BARD   ASO w claudication.  The various methods of treatment have been discussed with the patient and family. After consideration of risks, benefits and other options for treatment, the patient has consented to  Procedure(s): Lower Extremity Angiography (Left) as a surgical intervention.  The patient's history has been reviewed, patient examined, no change in status, stable for surgery.  I have reviewed the patient's chart and labs.  Questions were answered to the patient's satisfaction.     [name]

## 2022-02-12 NOTE — Consult Note (Incomplete)
NAME:  Jeffrey Grant, Jeffrey Grant MRN:  [id number], DOB:  [birth year], LOS: 0 ADMISSION DATE:  02/12/2022, CONSULTATION DATE:  02/12/22 REFERRING MD:  [name]  REASON FOR CONSULT:  Hypotension   HPI  Jeffrey Grant is a 41 year old male with a past medical history significant for hypertension, hyperlipidemia, severe peripheral vascular disease, on 06/01/2021 underwent aortobifemoral bypass graft along with bilateral femoral endarterectomies.  back pain, and rheumatoid arthritis who presented to [facility] on 02/12/2022 for elective  Left lower extremity angiogram with Vascular Surgery.   Patient follows with [facility] outpatient, and was found to have bilateral extremity rest pain.  Angiogram was not performed due to profound stenosis of the right common femoral that threatened patency of his bypass graft.  His common femoral proximal SFA and origin of the profunda are all greater than 95% stenotic. Due to the severity of disease, vascular felt that he will  need extensive reconstruction of the right groin both for limb salvage and prevent loss of the right limb of the bypass graft. While in PACU, patient complained of right foot drop and tightness from his thigh down to the foot.  Vascular was notified and patient was taken back to the OR . He was found with significant plaque in right common femoral and superficial femoral arteries s/p femoral endarterectomy .   He is transferred to ICU post procedure.  Noted to have significant pain and severe hypotension. Per OR report, patient lost 700 cc of blood and received 1 unit of PRBc   PCCM is consulted for medical management and pain management while in ICU  Pertinent Labs/Diagnostics Findings: Chemistry:Na+/ K+:  Glucose: BUN/Cr.:Calcium:  AST/ALT: CBC: WBC: Hgb/Hct: 10.5/32.7  Past Medical History   Anxiety    Atherosclerotic PVD with intermittent claudication (HCC)    Back pain    Collagen vascular disease (HCC)    Dyspnea     Dysrhythmia    Elevated lipids    Hypertension    Nausea    Seropositive rheumatoid arthritis ([place])    Significant Hospital Events   8/22: Underwent aortobifemoral bypass graft, returns to ICU post procedure, PCCM consulted for medical and pain management post-op  Consults:  PCCM  Procedures:  8/22:1. Right common femoral, distal portion of the bypass graft and superficial femoral artery endarterectomies and patch angioplasty with Cormatrix patch 2.  Fogarty embolectomy of the right limb of the aortobifemoral bypass graft with a 3 Fogarty balloon 3.  Redo operation for femoral exposure  Significant Diagnostic Tests:  none  Micro Data:  None  Antimicrobials:  None  OBJECTIVE  Blood pressure (!) 130/90, pulse 100, temperature (!) 96.9 F (36.1 C), resp. rate 12, height 5\' 9"  (1.753 m), weight 79.4 kg, SpO2 99 %.        Intake/Output Summary (Last 24 hours) at 02/12/2022 2009 Last data filed at 02/12/2022 1940 Gross per 24 hour  Intake 2840 ml  Output 1200 ml  Net 1640 ml   Filed Weights   02/12/22 0910 02/12/22 1533  Weight: 79.4 kg 79.4 kg   Physical Examination  GENERAL: 41 year-old critically ill patient lying in the bed with no acute distress.  EYES: Pupils equal, round, reactive to light and accommodation. No scleral icterus. Extraocular muscles intact.  HEENT: Head atraumatic, normocephalic. Oropharynx and nasopharynx clear.  NECK:  Supple, no jugular venous distention. No thyroid enlargement, no tenderness.  LUNGS: Normal breath sounds bilaterally, no wheezing, rales,rhonchi or crepitation. No use of accessory muscles of respiration.  CARDIOVASCULAR: S1, S2 normal. No murmurs, rubs, or gallops.  ABDOMEN: Soft, nontender, nondistended. Bowel sounds present. No organomegaly or mass.  EXTREMITIES: No pedal edema, cyanosis, or clubbing.  Right Groin site occlusive dressing with wound vac NEUROLOGIC: Cranial nerves II through XII are intact.  ROM limited Sensation  impaired. Gait not checked.  PSYCHIATRIC: The patient is alert and oriented x 3.  SKIN: No obvious rash, lesion, or ulcer.   Labs/imaging that I havepersonally reviewed  (right click and "Reselect all SmartList Selections" daily)     Labs   CBC: Recent Labs  Lab 02/12/22 1513  WBC 7.9  HGB 10.5*  HCT 32.7*  MCV 88.9  PLT [id number]    Basic Metabolic Panel: Recent Labs  Lab 02/12/22 0919 02/12/22 1513  NA  --  138  K  --  4.8  CL  --  107  CO2  --  26  GLUCOSE  --  97  BUN 13 12  CREATININE 0.96 1.01  CALCIUM  --  8.8*   GFR: Estimated Creatinine Clearance: 96.3 mL/min (by C-G formula based on SCr of 1.01 mg/dL). Recent Labs  Lab 02/12/22 1513  WBC 7.9    Liver Function Tests: No results for input(s): "AST", "ALT", "ALKPHOS", "BILITOT", "PROT", "ALBUMIN" in the last 168 hours. No results for input(s): "LIPASE", "AMYLASE" in the last 168 hours. No results for input(s): "AMMONIA" in the last 168 hours.  ABG    Component Value Date/Time   PHART 7.33 (L) 06/01/2021 1059   PCO2ART 42 06/01/2021 1059   PO2ART 169 (H) 06/01/2021 1059   HCO3 22.2 06/01/2021 1059   ACIDBASEDEF 3.9 (H) 06/01/2021 1059   O2SAT 99.5 06/01/2021 1059     Coagulation Profile: Recent Labs  Lab 02/12/22 1513  INR 0.9    Cardiac Enzymes: No results for input(s): "CKTOTAL", "CKMB", "CKMBINDEX", "TROPONINI" in the last 168 hours.  HbA1C: No results found for: "HGBA1C"  CBG: No results for input(s): "GLUCAP" in the last 168 hours.  Review of Systems:   Review of Systems  Constitutional: Negative.   HENT: Negative.    Eyes: Negative.   Respiratory: Negative.    Cardiovascular:  Positive for claudication and leg swelling.  Gastrointestinal: Negative.   Genitourinary: Negative.   Musculoskeletal: Negative.   Neurological: Negative.   Endo/Heme/Allergies: Negative.   Psychiatric/Behavioral:  The patient is nervous/anxious.    Past Medical History  He,  has a past medical  history of Anxiety, Atherosclerotic PVD with intermittent claudication ([place]), Back pain, Collagen vascular disease ([place]), Dyspnea, Dysrhythmia, Elevated lipids, Hypertension, Nausea, and Seropositive rheumatoid arthritis ([place]).   Surgical History    Past Surgical History:  Procedure Laterality Date   AORTA - BILATERAL FEMORAL ARTERY BYPASS GRAFT Bilateral 06/01/2021   Procedure: AORTA BIFEMORAL BYPASS GRAFT;  Surgeon: Katha Cabal, MD;  Location: [facility] ORS;  Service: Vascular;  Laterality: Bilateral;   COLONOSCOPY WITH PROPOFOL N/A 04/21/2018   Procedure: COLONOSCOPY WITH PROPOFOL;  Surgeon: Jonathon Bellows, MD;  Location: [facility] ENDOSCOPY;  Service: Gastroenterology;  Laterality: N/A;   ENDARTERECTOMY POPLITEAL Right 06/02/2021   Procedure: Right Femoral Endarterectomy and Patching Endoplasty;  Surgeon: Serafina Mitchell, MD;  Location: [facility] ORS;  Service: Vascular;  Laterality: Right;   ESOPHAGOGASTRODUODENOSCOPY (EGD) WITH PROPOFOL N/A 04/21/2018   Procedure: ESOPHAGOGASTRODUODENOSCOPY (EGD) WITH PROPOFOL;  Surgeon: Jonathon Bellows, MD;  Location: [facility] ENDOSCOPY;  Service: Gastroenterology;  Laterality: N/A;   HIP PINNING Right    LOWER EXTREMITY ANGIOGRAPHY N/A 05/29/2021   Procedure: LOWER  EXTREMITY ANGIOGRAPHY;  Surgeon: Renford Dills, MD;  Location: [facility] INVASIVE CV LAB;  Service: Cardiovascular;  Laterality: N/A;   LOWER EXTREMITY ANGIOGRAPHY Left 07/24/2021   Procedure: LOWER EXTREMITY ANGIOGRAPHY;  Surgeon: Renford Dills, MD;  Location: [facility] INVASIVE CV LAB;  Service: Cardiovascular;  Laterality: Left;   LOWER EXTREMITY ANGIOGRAPHY Right 08/07/2021   Procedure: Lower Extremity Angiography;  Surgeon: Renford Dills, MD;  Location: [facility] INVASIVE CV LAB;  Service: Cardiovascular;  Laterality: Right;   TOTAL HIP ARTHROPLASTY Right 10/22/2016   Procedure: TOTAL HIP ARTHROPLASTY ANTERIOR APPROACH;  Surgeon: Kennedy Bucker, MD;  Location: [facility] ORS;  Service: Orthopedics;  Laterality: Right;      Social History   reports that he quit smoking about 8 months ago. His smoking use included cigarettes. He has a 4.00 pack-year smoking history. He has never used smokeless tobacco. He reports that he does not currently use drugs. He reports that he does not drink alcohol.   Family History   His family history is not on file.   Allergies No Known Allergies   Home Medications  Prior to Admission medications   Medication Sig Start Date End Date Taking? Authorizing Provider  amLODipine (NORVASC) 10 MG tablet Take 0.5 tablets (5 mg total) by mouth in the morning. Patient taking differently: Take 10 mg by mouth in the morning. 06/05/21  Yes Wouk, Wilfred Curtis, MD  aspirin EC 81 MG EC tablet Take 1 tablet (81 mg total) by mouth daily. Swallow whole. 06/06/21  Yes Wouk, Wilfred Curtis, MD  atorvastatin (LIPITOR) 80 MG tablet Take 1 tablet (80 mg total) by mouth daily. 06/06/21  Yes Wouk, Wilfred Curtis, MD  cilostazol (PLETAL) 100 MG tablet Take 1 tablet (100 mg total) by mouth 2 (two) times daily. 01/28/22  Yes Georgiana Spinner, NP  clopidogrel (PLAVIX) 75 MG tablet Take 1 tablet (75 mg total) by mouth daily. 06/06/21  Yes Wouk, Wilfred Curtis, MD  DULoxetine (CYMBALTA) 30 MG capsule Take 90 mg by mouth daily. 01/09/22  Yes [provider]  folic acid (FOLVITE) 400 MCG tablet Take 400 mcg by mouth daily. 12/27/21  Yes [provider]  hydroxychloroquine (PLAQUENIL) 200 MG tablet Take 200 mg by mouth 2 (two) times daily. 08/20/21  Yes [provider]  lisinopril (ZESTRIL) 20 MG tablet Take 20 mg by mouth daily.   Yes [provider]  LYRICA 100 MG capsule Take 100 mg by mouth 3 (three) times daily. 08/16/21  Yes [provider]  oxyCODONE-acetaminophen (PERCOCET) 7.5-325 MG tablet Take 1 tablet by mouth every 6 (six) hours as needed for severe pain. 08/06/21  Yes Georgiana Spinner, NP  acetaminophen (TYLENOL) 500 MG tablet Take 1,000 mg by mouth every 6 (six) hours  as needed (pain.).    [provider]  amitriptyline (ELAVIL) 50 MG tablet Take 50 mg by mouth at bedtime. Patient not taking: Reported on 09/19/2021 07/13/21   [provider]  cyclobenzaprine (FLEXERIL) 10 MG tablet Take 10 mg by mouth every 8 (eight) hours as needed for muscle spasms. Patient not taking: Reported on 07/18/2021 05/24/21   [provider]  methocarbamol (ROBAXIN) 500 MG tablet Take 1 tablet (500 mg total) by mouth every 6 (six) hours as needed for muscle spasms. Patient not taking: Reported on 01/21/2022 07/12/21   Delton Prairie, MD  NEURONTIN 300 MG capsule Take 1 capsule (300 mg total) by mouth 3 (three) times daily. Patient not taking: Reported on 01/21/2022 07/04/21   Manson Passey,  Erma Pinto, NP  nicotine (NICODERM CQ - DOSED IN MG/24 HOURS) 21 mg/24hr patch Place 21 mg onto the skin daily. Patient not taking: Reported on 07/18/2021    [provider]  nicotine polacrilex (NICORETTE) 4 MG gum Take 4 mg by mouth as needed for smoking cessation. Patient not taking: Reported on 07/18/2021    [provider]  ondansetron (ZOFRAN) 8 MG tablet Take 8 mg by mouth every 8 (eight) hours as needed. Patient not taking: Reported on 07/18/2021 06/13/21   [provider]  sulfamethoxazole-trimethoprim (BACTRIM DS) 800-160 MG tablet Take 1 tablet by mouth 2 (two) times daily. Patient not taking: Reported on 01/21/2022 10/19/21   Georgiana Spinner, NP  Vitamin D, Ergocalciferol, (DRISDOL) 50000 units CAPS capsule Take 50,000 Units by mouth every Thursday. Patient not taking: Reported on 09/19/2021    [provider]  Scheduled Meds:  amitriptyline  50 mg Oral QHS   aspirin EC  81 mg Oral Daily   atorvastatin  80 mg Oral Daily   Chlorhexidine Gluconate Cloth  6 each Topical Daily   clopidogrel  75 mg Oral Daily   docusate sodium  100 mg Oral Daily   DULoxetine  90 mg Oral Daily   enoxaparin (LOVENOX) injection  40 mg Subcutaneous Q24H   fentaNYL        gabapentin  300 mg Oral TID   HYDROmorphone       hydroxychloroquine  200 mg Oral BID   Continuous Infusions:  sodium chloride 100 mL/hr at 02/13/22 0450   sodium chloride      ceFAZolin (ANCEF) IV 2 g (02/13/22 0006)   DOPamine     famotidine (PEPCID) IV 20 mg (02/12/22 2148)   magnesium sulfate bolus IVPB     nitroGLYCERIN     PRN Meds:.sodium chloride, acetaminophen **OR** acetaminophen, alum & mag hydroxide-simeth, fentaNYL, guaiFENesin-dextromethorphan, hydrALAZINE, HYDROmorphone, HYDROmorphone (DILAUDID) injection, labetalol, magnesium sulfate bolus IVPB, metoprolol tartrate, ondansetron, mouth rinse, oxyCODONE-acetaminophen, phenol, potassium chloride, senna-docusate, sorbitol  Active Hospital Problem list     Assessment & Plan:  Severe Bilateral Peripheral Artery Disease with rest pain  -Vascular Surgery following, appreciate input -S/p Aortobifemoral bypass Graft, and Right femoral artery endarterectomies -Post-op management/assessment as per Vascular -Pain control with Prn Dilaudid and Percocet ~ if uncontrolled may need to consider PCA  Acute Blood Loss Anemia (EBL 700 cc intra-op) -Monitor for S/Sx of bleeding -Trend CBC -SCD's for VTE Prophylaxis (Lovenox to begin 12/10)  -Transfuse for Hgb <8   Hx of Hypertension now hypotensive post -op likely in the setting of acute blood loss -Continuous cardiac monitoring -Levophed gtt to keep MAP goal > 65 mm Hg -Hold home Norvasc and Lisinopril    Mild Leukocytosis, suspect reactive -Monitor fever curve -Trend WBC's   Best practice:  Diet:  Oral Pain/Anxiety/Delirium protocol (if indicated): No VAP protocol (if indicated): Not indicated DVT prophylaxis: Contraindicated GI prophylaxis: N/A Glucose control:  SSI No Central venous access:  N/A Arterial line:  Yes, and it is still needed Foley:  Yes, and it is still needed Mobility:  bed rest  PT consulted: N/A Last date of multidisciplinary goals of care  discussion [8/22] Code Status:  full code Disposition: ICU   = Goals of Care = Code Status Order: FULL  Primary Emergency Contact: Bonnes,Renita, Home Phone: [phone] Wishes to pursue full aggressive treatment and intervention options, including CPR and intubation, but goals of care will be addressed on going with family if that should become necessary.  Critical care time: 45 minutes       Rufina Falco, DNP, CCRN, FNP-C, AGACNP-BC Acute Care Nurse Practitioner [facility]  PCCM on call pager [phone] until 7 am

## 2022-02-12 NOTE — Anesthesia Procedure Notes (Signed)
Procedure Name: Intubation Date/Time: 02/12/2022 4:01 PM  Performed by: Morene Crocker, CRNAPre-anesthesia Checklist: Patient identified, Patient being monitored, Timeout performed, Emergency Drugs available and Suction available Patient Re-evaluated:Patient Re-evaluated prior to induction Oxygen Delivery Method: Circle system utilized Preoxygenation: Pre-oxygenation with 100% oxygen Induction Type: IV induction, Rapid sequence and Cricoid Pressure applied Laryngoscope Size: 3 and McGraph Grade View: Grade I Tube type: Oral Tube size: 7.0 mm Number of attempts: 1 Airway Equipment and Method: Stylet Placement Confirmation: ETT inserted through vocal cords under direct vision, positive ETCO2 and breath sounds checked- equal and bilateral Secured at: 22 cm Tube secured with: Tape Dental Injury: Teeth and Oropharynx as per pre-operative assessment

## 2022-02-12 NOTE — Interval H&P Note (Signed)
History and Physical Interval Note:  02/12/2022 9:40 AM  Jeffrey Grant.  has presented today for surgery, with the diagnosis of LLE Angio   BARD   ASO w claudication.  The various methods of treatment have been discussed with the patient and family. After consideration of risks, benefits and other options for treatment, the patient has consented to  Procedure(s): Lower Extremity Angiography (Left) as a surgical intervention.  The patient's history has been reviewed, patient examined, no change in status, stable for surgery.  I have reviewed the patient's chart and labs.  Questions were answered to the patient's satisfaction.     [name]

## 2022-02-12 NOTE — Op Note (Signed)
OPERATIVE NOTE   PROCEDURE: 1.   Right common femoral, distal portion of the bypass graft and superficial femoral artery endarterectomies and patch angioplasty with Cormatrix patch 2.  Fogarty embolectomy of the right limb of the aortobifemoral bypass graft with a 3 Fogarty balloon 3.  Redo operation for femoral exposure    PRE-OPERATIVE DIAGNOSIS: 1.Atherosclerotic occlusive disease right lower extremities with rest pain, acute on chronic ischemia with occlusion of the right limb of his aortobifemoral bypass and high-grade stenosis of the distal portion of the bypass in the proximal portion of the superficial femoral artery   POST-OPERATIVE DIAGNOSIS: Same  SURGEON: Festus Barren, MD  CO-surgeon:  [name], MD  ANESTHESIA:  general  ESTIMATED BLOOD LOSS: 700 cc  FINDING(S): 1.  significant plaque in right common femoral and superficial femoral arteries  SPECIMEN(S):  Right common femoral and superficial femoral artery plaque.  Large amount of thrombus from right limb of the aortobifemoral bypass graft  INDICATIONS:    Patient presents with acute on chronic ischemia of the right lower extremity with rest pain neurologic changes.  Right femoral endarterectomy is planned to try to improve perfusion as well as revascularization of the right limb of the aortobifemoral bypass graft.  The risks and benefits as well as alternative therapies including intervention were reviewed in detail all questions were answered the patient agrees to proceed with surgery.  DESCRIPTION: After obtaining full informed written consent, the patient was brought back to the operating room and placed supine upon the operating table.  The patient received IV antibiotics prior to induction.  After obtaining adequate anesthesia, the patient was prepped and draped in the standard fashion appropriate time out is called.    Vertical incision was created overlying the right femoral arteries. The common femoral  artery proximally and the distal portion of the aortobifemoral bypass graft and superficial femoral artery, and primary profunda femoris artery branches were encircled with vessel loops and prepared for control. The right femoral arteries were found to have significant plaque from the common femoral artery into the profunda and superficial femoral arteries.  The dissection was extremely tedious due to the redo nature of the operation large amount of scar tissue.  There was significant venous bleeding and multiple small branches that had to be oversewn with 5-0 Prolene sutures.  This was a difficult and tedious dissection due to the extensive scar tissue from the redo nature of the operation.   4000 units of heparin was given and allowed circulate for 5 minutes.  Additional heparin was given later as needed  Attention is then turned to the right femoral artery.  An arteriotomy is made with 11 blade and extended with Potts scissors in the common femoral artery and up into the distal several centimeters of the bypass graft and carried down onto the first 4 cm of the superficial femoral artery down to the previously placed stent. An endarterectomy was then performed. [facility] was used to create a plane. The proximal endpoint was created with gentle traction where there was dense hyperplasia within the distal portion of the bypass graft.  An eversion endarterectomy was then performed for the first 2-3 cm of the profunda femoris artery. Good backbleeding was then seen. The distal endpoint of the superficial femoral endarterectomy was created with gentle traction and the distal endpoint was cut with tenotomy scissors at the proximal edge of the previous SFA stents.  This was then tacked down with a series of interrupted 5-0 and 6-0 Prolene  sutures.  We also tacked down the old bypass graft to the common femoral artery to ensure that this integrity was intact. We then turned our attention to the right  limb of the aortobifemoral bypass graft more proximally.  A 3 Fogarty embolectomy balloon was brought onto the field and passed proximally about 25 to 30 cm.  It pulled back a large ring of thrombus from the graft on the first pass and there was restoration of significant arterial flow.  2 more passes were made with a 3 Fogarty embolectomy balloon with no significant return of thrombus.  The graft and the common femoral artery were then clamped.  At this point, the arteriotomy including the distal portion of the bypass graft needed to be closed to reconstruct the distal portion of the aortobifemoral bypass and widely closed the femoral artery and superficial femoral artery to avoid recurrent stenosis as best possible.  The Cormatrix patcth is then selected and prepared for a patch angioplasty.  It is cut and beveled and started at the proximal endpoint with a 6-0 Prolene suture.  Approximately one half of the suture line is run medially and laterally and the distal end point was cut and bevelled to match the arteriotomy.  A second 6-0 Prolene was started at the distal end point and run to the mid portion to complete the arteriotomy.  The vessel was flushed prior to release of control and completion of the anastomosis.  At this point, flow was established first to the profunda femoris artery and then to the superficial femoral artery. Easily palpable pulses are noted well beyond the anastomosis and both arteries.  Several bleeding sites were controlled with interrupted 5 oh or 6-0 Prolene sutures.  Vistacel and Fibrillar topical hemostatic agents were placed in the femoral incision and hemostasis was complete. The femoral incision was then closed in a layered fashion with 2 layers of 2-0 Vicryl, 2 layers of 3-0 Vicryl, and staples for the skin closure. An incisional VAC was then placed over the incision.  The patient was then awakened from anesthesia and taken to the recovery room in stable condition having  tolerated the procedure well.  COMPLICATIONS: None  CONDITION: Stable     Festus Barren 02/12/2022 7:29 PM   This note was created with Dragon Medical transcription system. Any errors in dictation are purely unintentional.

## 2022-02-12 NOTE — Anesthesia Procedure Notes (Signed)
Arterial Line Insertion Start/End8/22/2023 4:40 PM, 02/12/2022 4:40 PM Performed by: Foye Deer, MD, anesthesiologist  Patient location: OR. Preanesthetic checklist: patient identified, IV checked, site marked, risks and benefits discussed, surgical consent, monitors and equipment checked, pre-op evaluation, timeout performed and anesthesia consent Left, radial was placed Catheter size: 20 G Hand hygiene performed  and maximum sterile barriers used   Attempts: 1 Procedure performed using ultrasound guided technique. Ultrasound Notes:anatomy identified, needle tip was noted to be adjacent to the nerve/plexus identified and no ultrasound evidence of intravascular and/or intraneural injection Following insertion, dressing applied. Post procedure assessment: normal and unchanged

## 2022-02-13 ENCOUNTER — Encounter: Payer: Self-pay | Admitting: Vascular Surgery

## 2022-02-13 LAB — BASIC METABOLIC PANEL
Anion gap: 9 (ref 5–15)
BUN: 14 mg/dL (ref 6–20)
CO2: 21 mmol/L — ABNORMAL LOW (ref 22–32)
Calcium: 8.6 mg/dL — ABNORMAL LOW (ref 8.9–10.3)
Chloride: 107 mmol/L (ref 98–111)
Creatinine, Ser: 0.87 mg/dL (ref 0.61–1.24)
GFR, Estimated: >60 mL/min (ref >60)
Glucose, Bld: 178 mg/dL — ABNORMAL HIGH (ref 70–99)
Potassium: 4 mmol/L (ref 3.5–5.1)
Sodium: 137 mmol/L (ref 135–145)

## 2022-02-13 LAB — CBC
HCT: 24.5 % — ABNORMAL LOW (ref 39.0–52.0)
Hemoglobin: 8.2 g/dL — ABNORMAL LOW (ref 13.0–17.0)
MCH: 29.7 pg (ref 26.0–34.0)
MCHC: 33.5 g/dL (ref 30.0–36.0)
MCV: 88.8 fL (ref 80.0–100.0)
Platelets: 173 10*3/uL (ref 150–400)
RBC: 2.76 MIL/uL — ABNORMAL LOW (ref 4.22–5.81)
RDW: 16.7 % — ABNORMAL HIGH (ref 11.5–15.5)
WBC: 13.9 10*3/uL — ABNORMAL HIGH (ref 4.0–10.5)
nRBC: 0 % (ref 0.0–0.2)

## 2022-02-13 LAB — PREPARE RBC (CROSSMATCH)

## 2022-02-13 LAB — MRSA NEXT GEN BY PCR, NASAL: MRSA by PCR Next Gen: NOT DETECTED

## 2022-02-13 NOTE — Progress Notes (Signed)
[facility]  Daily Progress Note   Subjective  -   Patient resting comfortably with pain well-controlled.  Objective Vitals:   02/13/22 0800 02/13/22 0900 02/13/22 1000 02/13/22 1230  BP:    (!) 146/92  Pulse:      Resp: 13 20 19 14   Temp:    98.3 F (36.8 C)  TempSrc:    Oral  SpO2:    100%  Weight:      Height:        Intake/Output Summary (Last 24 hours) at 02/13/2022 1345 Last data filed at 02/13/2022 1200 Gross per 24 hour  Intake 3900.64 ml  Output 3050 ml  Net 850.64 ml    PULM  CTAB CV  RRR VASC  right foot warm, dopplerable pulses  Laboratory CBC    Component Value Date/Time   WBC 14.0 (H) 02/12/2022 2145   HGB 9.4 (L) 02/12/2022 2145   HGB 14.4 03/30/2018 1404   HCT 28.7 (L) 02/12/2022 2145   HCT 42.9 03/30/2018 1404   PLT 195 02/12/2022 2145   PLT 321 03/30/2018 1404    BMET    Component Value Date/Time   NA 137 02/12/2022 2145   NA 140 03/30/2018 1404   K 4.7 02/12/2022 2145   CL 109 02/12/2022 2145   CO2 22 02/12/2022 2145   GLUCOSE 163 (H) 02/12/2022 2145   BUN 11 02/12/2022 2145   BUN 9 03/30/2018 1404   CREATININE 0.79 02/12/2022 2145   CALCIUM 8.7 (L) 02/12/2022 2145   GFRNONAA >60 02/12/2022 2145   GFRAA 129 03/30/2018 1404    Assessment/Planning: POD #1 s/p right femoral endarterectomy and embolectomy  Currently pain is well controlled for patient Wound VAC in place with good suction There is swelling of the right thigh but no significant distally.   We will have PT: Beginning tomorrow  05/30/2018  02/13/2022, 1:45 PM

## 2022-02-13 NOTE — Anesthesia Postprocedure Evaluation (Signed)
Anesthesia Post Note  Patient: Jeffrey Grant.  Procedure(s) Performed: ENDARTERECTOMY FEMORAL (Right) APPLICATION OF CELL SAVER (Right) APPLICATION OF WOUND VAC (Right) EMBOLECTOMY (Right)  Patient location during evaluation: ICU Anesthesia Type: General Level of consciousness: awake and alert Pain management: pain level controlled Respiratory status: spontaneous breathing Cardiovascular status: stable Anesthetic complications: no   No notable events documented.   Last Vitals:  Vitals:   02/13/22 0500 02/13/22 0600  BP: 130/87 (!) 155/92  Pulse: 95 99  Resp: 17 13  Temp:    SpO2: 100% 100%    Last Pain:  Vitals:   02/13/22 0400  TempSrc: Oral  PainSc: Asleep                 Jaye Beagle

## 2022-02-13 NOTE — Progress Notes (Signed)
[facility] Encounter Note  Patient: Jeffrey Grant. / Admit Date: 02/12/2022 / Date of Encounter: 02/13/2022, 4:56 PM   Subjective: The patient has tolerated his surgical intervention of his right femoral graft with no current evidence of complication.  There has been no evidence of congestive heart failure or anginal symptoms or change in EKG.  Telemetry shows normal sinus rhythm.  The patient has been on appropriate medication management for risk reduction as listed below.  There is no evidence of need for other diagnostic testing  Review of Systems: Positive for: None Negative for: Vision change, hearing change, syncope, dizziness, nausea, vomiting,diarrhea, bloody stool, stomach pain, cough, congestion, diaphoresis, urinary frequency, urinary pain,skin lesions, skin rashes Others previously listed  Objective: Telemetry: Normal sinus rhythm Physical Exam: Blood pressure (!) 155/88, pulse (!) 110, temperature 99.3 F (37.4 C), temperature source Oral, resp. rate 14, height 5\' 9"  (1.753 m), weight 79.4 kg, SpO2 100 %. Body mass index is 25.84 kg/m. General: Well developed, well nourished, in no acute distress. Head: Normocephalic, atraumatic, sclera non-icteric, no xanthomas, nares are without discharge. Neck: No apparent masses Lungs: Normal respirations with no wheezes, no rhonchi, no rales , no crackles   Heart: Regular rate and rhythm, normal S1 S2, no murmur, no rub, no gallop, PMI is normal size and placement, carotid upstroke normal without bruit, jugular venous pressure normal Abdomen: Soft, non-tender, non-distended with normoactive bowel sounds. No hepatosplenomegaly. Abdominal aorta is normal size without bruit Extremities: No edema, no clubbing, no cyanosis, no ulcers,  Peripheral: 2+ radial, 2+ femoral, 1 on + dorsal pedal pulses Neuro: Alert and oriented. Moves all extremities spontaneously. Psych:  Responds to questions appropriately with a normal  affect.   Intake/Output Summary (Last 24 hours) at 02/13/2022 1656 Last data filed at 02/13/2022 1504 Gross per 24 hour  Intake 3367.31 ml  Output 3400 ml  Net -32.69 ml    Inpatient Medications:   amitriptyline  50 mg Oral QHS   aspirin EC  81 mg Oral Daily   atorvastatin  80 mg Oral Daily   Chlorhexidine Gluconate Cloth  6 each Topical Daily   clopidogrel  75 mg Oral Daily   docusate sodium  100 mg Oral Daily   DULoxetine  90 mg Oral Daily   enoxaparin (LOVENOX) injection  40 mg Subcutaneous Q24H   gabapentin  300 mg Oral TID   hydroxychloroquine  200 mg Oral BID   Infusions:   sodium chloride 100 mL/hr at 02/13/22 1504   sodium chloride     DOPamine     famotidine (PEPCID) IV Stopped (02/13/22 0957)   magnesium sulfate bolus IVPB     nitroGLYCERIN      Labs: Recent Labs    02/12/22 1513 02/12/22 2145  NA 138 137  K 4.8 4.7  CL 107 109  CO2 26 22  GLUCOSE 97 163*  BUN 12 11  CREATININE 1.01 0.79  CALCIUM 8.8* 8.7*   No results for input(s): "AST", "ALT", "ALKPHOS", "BILITOT", "PROT", "ALBUMIN" in the last 72 hours. Recent Labs    02/12/22 1513 02/12/22 2145  WBC 7.9 14.0*  HGB 10.5* 9.4*  HCT 32.7* 28.7*  MCV 88.9 88.3  PLT 239 195   No results for input(s): "CKTOTAL", "CKMB", "TROPONINI" in the last 72 hours. Invalid input(s): "POCBNP" No results for input(s): "HGBA1C" in the last 72 hours.   Weights: Filed Weights   02/12/22 0910 02/12/22 1533  Weight: 79.4 kg 79.4 kg  Radiology/Studies:  DG HIP UNILAT WITH PELVIS 2-3 VIEWS RIGHT  Result Date: 02/12/2022 CLINICAL DATA:  Imaging needed for needle count in OR EXAM: DG HIP (WITH OR WITHOUT PELVIS) 2-3V RIGHT COMPARISON:  Radiographs 10/22/2016 FINDINGS: Right THA. New right femoral vascular stent within the medial thigh. Wound VAC. Cutaneous staples. No unexpected radiopaque foreign body. Per discussion with Darla Lesches RN, 6-0 prolene needle was initially thought to be missing however  subsequent counts were normal. IMPRESSION: No unexpected radiopaque foreign body. These results were called by telephone at the time of interpretation on 02/12/2022 at 8:03 pm to provider Darla Lesches RN, who verbally acknowledged these results. Electronically Signed   By: Minerva Fester M.D.   On: 02/12/2022 20:04   PERIPHERAL VASCULAR CATHETERIZATION  Result Date: 02/12/2022 See surgical note for result.  VAS [place] LOWER EXTREMITY ARTERIAL DUPLEX  Result Date: 01/31/2022 LOWER EXTREMITY ARTERIAL DUPLEX STUDY Patient Name:  Jeffrey Grant.  Date of Exam:   01/21/2022 Medical Rec #: [id number]          Accession #:    [id number] Date of Birth: 12/27/1980          Patient Gender: M Patient Age:   6 years Exam Location:  [facility] Procedure:      VAS [place] LOWER EXTREMITY ARTERIAL DUPLEX Referring Phys: Sheppard Plumber --------------------------------------------------------------------------------  Indications: Peripheral artery disease. High Risk Factors: Current smoker.  Vascular Interventions: 06/01/21: Aortobifemoral BPG with bilateral CFA/SFA                         endarterectomies;                         06/02/21: Right SFA endarterectomy/angioplasty;. Current ABI:            Right: 0.70, Left : 0.62 Performing Technologist: Hardie Lora RVT  Examination Guidelines: A complete evaluation includes B-mode imaging, spectral Doppler, color Doppler, and power Doppler as needed of all accessible portions of each vessel. Bilateral testing is considered an integral part of a complete examination. Limited examinations for reoccurring indications may be performed as noted.  +----------+--------+-----+---------------+----------+--------+ RIGHT     PSV cm/sRatioStenosis       Waveform  Comments +----------+--------+-----+---------------+----------+--------+ CFA Mid   85                          monophasic         +----------+--------+-----+---------------+----------+--------+ CFA  Distal343          50-74% stenosismonophasic         +----------+--------+-----+---------------+----------+--------+ DFA       104                         monophasic         +----------+--------+-----+---------------+----------+--------+ SFA Prox  117                         monophasic         +----------+--------+-----+---------------+----------+--------+ SFA Mid   54                          monophasic         +----------+--------+-----+---------------+----------+--------+ SFA Distal80  monophasic         +----------+--------+-----+---------------+----------+--------+ POP Prox  63                          monophasic         +----------+--------+-----+---------------+----------+--------+ POP Distal49                          monophasic         +----------+--------+-----+---------------+----------+--------+ PTA Prox  26                          monophasic         +----------+--------+-----+---------------+----------+--------+ PTA Distal26                          monophasic         +----------+--------+-----+---------------+----------+--------+ A focal velocity elevation of 343 cm/s was obtained at CFA / Distal ao bifemo with a VR of 4.1. Findings are characteristic of 50-74% stenosis.  Right Stent(s): +---------------+--------+--------+----------+--------+ Prox SFA       PSV cm/sStenosisWaveform  Comments +---------------+--------+--------+----------+--------+ Prox to Stent  110             monophasic         +---------------+--------+--------+----------+--------+ Proximal Stent 104             monophasic         +---------------+--------+--------+----------+--------+ Mid Stent      56              monophasic         +---------------+--------+--------+----------+--------+ Distal Stent   55              monophasic         +---------------+--------+--------+----------+--------+ Distal to Stent82               monophasic         +---------------+--------+--------+----------+--------+    +----------+--------+-----+--------+----------+--------+ LEFT      PSV cm/sRatioStenosisWaveform  Comments +----------+--------+-----+--------+----------+--------+ CFA Distal251                  biphasic           +----------+--------+-----+--------+----------+--------+ DFA       107                  biphasic           +----------+--------+-----+--------+----------+--------+ SFA Prox  119                  monophasic         +----------+--------+-----+--------+----------+--------+ SFA Mid   122                  monophasic         +----------+--------+-----+--------+----------+--------+ SFA Distal132                  monophasic         +----------+--------+-----+--------+----------+--------+ POP Prox  97                   monophasic         +----------+--------+-----+--------+----------+--------+ POP Distal44                   monophasic         +----------+--------+-----+--------+----------+--------+ ATA Distal42  monophasic         +----------+--------+-----+--------+----------+--------+ PTA Distal36                   monophasic         +----------+--------+-----+--------+----------+--------+  Left Stent(s): +---------------+--------+---------------+----------+--------+ Distal SFA     PSV cm/sStenosis       Waveform  Comments +---------------+--------+---------------+----------+--------+ Prox to Stent  83                     monophasic         +---------------+--------+---------------+----------+--------+ Proximal Stent 158                    monophasic         +---------------+--------+---------------+----------+--------+ Mid Stent      88                     monophasic         +---------------+--------+---------------+----------+--------+ Distal Stent   252     50-99% stenosismonophasic          +---------------+--------+---------------+----------+--------+ Distal to Stent114                    monophasic         +---------------+--------+---------------+----------+--------+    Summary: Right: 50-74% stenosis noted in the common femoral artery. Patent stent with no evidence of stenosis in the Proximal superficial femoral artery artery. Left: Stenosis is noted within the Distal superficial femoral artery stent.  See table(s) above for measurements and observations. Electronically signed by [name] MD on 01/31/2022 at 4:06:10 PM.    Final    VAS [place] ABI WITH/WO TBI  Result Date: 01/21/2022  LOWER EXTREMITY DOPPLER STUDY Patient Name:  Malin Eden JR.  Date of Exam:   01/21/2022 Medical Rec #: [id number]          Accession #:    [id number] Date of Birth: Aug 29, 1980          Patient Gender: M Patient Age:   87 years Exam Location:  [facility] Procedure:      VAS [place] ABI WITH/WO TBI Referring Phys: Sheppard Plumber --------------------------------------------------------------------------------  Indications: Claudication, and peripheral artery disease. High Risk Factors: Hypertension, current smoker.  Vascular Interventions: 06/01/21: Aortobifemoral BPG with bilateral CFA/SFA                         endarterectomies;                         06/02/21: Right SFA endarterectomy/angioplasty;                          07/24/2021: Left Lower Extremity Angiogram Third order                         catheter placement. Angioplasty and Stent placement Left                         SFA and Popliteal Artery.                          08/07/2021: PTA and Stent placement Right SFA. Performing Technologist: Hardie Lora RVT  Examination Guidelines: A complete evaluation includes at minimum, Doppler waveform signals and systolic blood pressure reading at the level of bilateral  brachial, anterior tibial, and posterior tibial arteries, when vessel segments are accessible. Bilateral testing is considered an  integral part of a complete examination. Photoelectric Plethysmograph (PPG) waveforms and toe systolic pressure readings are included as required and additional duplex testing as needed. Limited examinations for reoccurring indications may be performed as noted.  ABI Findings: +---------+------------------+-----+----------+--------+ Right    Rt Pressure (mmHg)IndexWaveform  Comment  +---------+------------------+-----+----------+--------+ Brachial 117                                       +---------+------------------+-----+----------+--------+ ATA      58                0.50 monophasic         +---------+------------------+-----+----------+--------+ PTA      82                0.70 monophasic         +---------+------------------+-----+----------+--------+ PERO     70                0.60 monophasic         +---------+------------------+-----+----------+--------+ Great Toe0                 0.00                    +---------+------------------+-----+----------+--------+ +---------+------------------+-----+----------+-------+ Left     Lt Pressure (mmHg)IndexWaveform  Comment +---------+------------------+-----+----------+-------+ Brachial 108                                      +---------+------------------+-----+----------+-------+ ATA      69                0.59 monophasic        +---------+------------------+-----+----------+-------+ PTA      73                0.62 monophasic        +---------+------------------+-----+----------+-------+ Great Toe62                0.53                   +---------+------------------+-----+----------+-------+ +-------+-----------+-----------+------------+------------+ ABI/TBIToday's ABIToday's TBIPrevious ABIPrevious TBI +-------+-----------+-----------+------------+------------+ Right  0.70       0.00       0.91        0.78         +-------+-----------+-----------+------------+------------+ Left   0.62        0.53       0.92        0.98         +-------+-----------+-----------+------------+------------+ Bilateral ABIs appear decreased compared to prior study on 10/19/2021.  Summary: Right: Resting right ankle-brachial index indicates moderate right lower extremity arterial disease. The right toe-brachial index is abnormal. Left: Resting left ankle-brachial index indicates moderate left lower extremity arterial disease. The left toe-brachial index is abnormal. *See table(s) above for measurements and observations.  Electronically signed by [name] MD on 01/21/2022 at 5:12:23 PM.    Final      Assessment and Recommendation  41 y.o. male with known peripheral vascular disease hypertension hyperlipidemia without evidence of cardiovascular signs or symptoms status post vascular surgery without complication 1.  Continue rehabilitation without restriction 2.  Antiplatelet medication management for peripheral vascular disease and other coronary artery disease risk  reduction 3.  High intensity cholesterol therapy 4.  Begin ambulation and follow-up for improvements of symptoms 5.  No further diagnostic testing needed 6.  Call if further questions  Signed, Arnoldo Hooker M.D. FACC

## 2022-02-14 LAB — CBC
HCT: 21.8 % — ABNORMAL LOW (ref 39.0–52.0)
Hemoglobin: 7.1 g/dL — ABNORMAL LOW (ref 13.0–17.0)
MCH: 28.7 pg (ref 26.0–34.0)
MCHC: 32.6 g/dL (ref 30.0–36.0)
MCV: 88.3 fL (ref 80.0–100.0)
Platelets: 175 10*3/uL (ref 150–400)
RBC: 2.47 MIL/uL — ABNORMAL LOW (ref 4.22–5.81)
RDW: 16.9 % — ABNORMAL HIGH (ref 11.5–15.5)
WBC: 12 10*3/uL — ABNORMAL HIGH (ref 4.0–10.5)
nRBC: 0.3 % — ABNORMAL HIGH (ref 0.0–0.2)

## 2022-02-14 LAB — BASIC METABOLIC PANEL
Anion gap: 8 (ref 5–15)
BUN: 16 mg/dL (ref 6–20)
CO2: 23 mmol/L (ref 22–32)
Calcium: 8.7 mg/dL — ABNORMAL LOW (ref 8.9–10.3)
Chloride: 106 mmol/L (ref 98–111)
Creatinine, Ser: 0.84 mg/dL (ref 0.61–1.24)
GFR, Estimated: >60 mL/min (ref >60)
Glucose, Bld: 162 mg/dL — ABNORMAL HIGH (ref 70–99)
Potassium: 3.9 mmol/L (ref 3.5–5.1)
Sodium: 137 mmol/L (ref 135–145)

## 2022-02-14 LAB — HEMOGLOBIN AND HEMATOCRIT, BLOOD
HCT: 26.4 % — ABNORMAL LOW (ref 39.0–52.0)
Hemoglobin: 8.7 g/dL — ABNORMAL LOW (ref 13.0–17.0)

## 2022-02-14 LAB — SURGICAL PATHOLOGY

## 2022-02-14 MED ORDER — FUROSEMIDE 10 MG/ML IJ SOLN
20.0000 mg | Freq: Once | INTRAMUSCULAR | Status: AC
  Administered 2022-02-14: 20 mg via INTRAVENOUS
  Filled 2022-02-14: qty 2

## 2022-02-14 MED ORDER — ACETAMINOPHEN 325 MG PO TABS
650.0000 mg | ORAL_TABLET | Freq: Once | ORAL | Status: AC
  Administered 2022-02-14: 650 mg via ORAL
  Filled 2022-02-14: qty 2

## 2022-02-14 MED ORDER — PREGABALIN 75 MG PO CAPS
100.0000 mg | ORAL_CAPSULE | Freq: Two times a day (BID) | ORAL | Status: DC
  Administered 2022-02-14 – 2022-02-15 (×3): 100 mg via ORAL
  Filled 2022-02-14 (×3): qty 1

## 2022-02-14 MED ORDER — DIPHENHYDRAMINE HCL 50 MG/ML IJ SOLN
25.0000 mg | Freq: Once | INTRAMUSCULAR | Status: AC
  Administered 2022-02-14: 25 mg via INTRAVENOUS
  Filled 2022-02-14: qty 1

## 2022-02-14 MED ORDER — SODIUM CHLORIDE 0.9% IV SOLUTION: Freq: Once | INTRAVENOUS | Status: AC

## 2022-02-14 NOTE — TOC Initial Note (Signed)
Transition of Care (TOC) - Initial/Assessment Note    Patient Details  Name: Jeffrey Grant. MRN: [id number] Date of Birth: 1980/11/21  Transition of Care [facility]) CM/SW Contact:    Shelbie Hutching, RN Phone Number: 02/14/2022, 4:16 PM  Clinical Narrative:                 Patient admitted after femoral popliteal bypass surgery.  RNCM met with patient at the bedside, introduced self and explained role in DC planning.  Patient is from home with his wife and 4 children.  Patient is independent, he does not drive, wife provides all transportation.  Patient reports having cane, walker, and bedside commode at home.  Patient agrees to outpatient physical therapy at discharge here at [facility].    Expected Discharge Plan: OP Rehab Barriers to Discharge: Continued Medical Work up   Patient Goals and CMS Choice Patient states their goals for this hospitalization and ongoing recovery are:: patient agrees to outpatient therapy at discharge      Expected Discharge Plan and Services Expected Discharge Plan: OP Rehab   Discharge Planning Services: CM Consult   Living arrangements for the past 2 months: Single Family Home Expected Discharge Date: 02/12/22               DME Arranged: N/A DME Agency: NA       HH Arranged: NA          Prior Living Arrangements/Services Living arrangements for the past 2 months: Single Family Home Lives with:: Spouse, Adult Children, Minor Children Patient language and need for interpreter reviewed:: Yes Do you feel safe going back to the place where you live?: Yes      Need for Family Participation in Patient Care: Yes (Comment) Care giver support system in place?: Yes (comment) Current home services: DME (cane, walker, bedside commode) Criminal Activity/Legal Involvement Pertinent to Current Situation/Hospitalization: No - Comment as needed  Activities of Daily Living Home Assistive Devices/Equipment: Cane (specify quad or straight) ADL Screening  (condition at time of admission) Patient's cognitive ability adequate to safely complete daily activities?: Yes Is the patient deaf or have difficulty hearing?: No Does the patient have difficulty seeing, even when wearing glasses/contacts?: No Does the patient have difficulty concentrating, remembering, or making decisions?: No Patient able to express need for assistance with ADLs?: Yes Does the patient have difficulty dressing or bathing?: No Independently performs ADLs?: Yes (appropriate for developmental age) Does the patient have difficulty walking or climbing stairs?: No Weakness of Legs: None Weakness of Arms/Hands: None  Permission Sought/Granted Permission sought to share information with : Case Manager, Family Supports Permission granted to share information with : Yes, Verbal Permission Granted  Share Information with NAME: Renita Wisher  Permission granted to share info w AGENCY: [facility] Outpatient therapy department  Permission granted to share info w Relationship: spouse  Permission granted to share info w Contact Information: [phone]  Emotional Assessment Appearance:: Appears stated age Attitude/Demeanor/Rapport: Engaged Affect (typically observed): Accepting Orientation: : Oriented to Self, Oriented to Place, Oriented to  Time, Oriented to Situation Alcohol / Substance Use: Not Applicable Psych Involvement: No (comment)  Admission diagnosis:  S/P femoral-popliteal bypass surgery [I20.355] Atherosclerotic peripheral vascular disease with intermittent claudication (HCC) [I70.219] Ischemia of extremity [I99.8] Patient Active Problem List   Diagnosis Date Noted   S/P femoral-popliteal bypass surgery 02/12/2022   Ischemia of extremity 02/12/2022   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[place]) 05/29/2021   Inflammatory pain 03/28/2021   Low  back pain 03/28/2021   Cervical radiculopathy 03/28/2021   Rheumatoid arthritis ([place]) 03/28/2021   High risk  medications (not anticoagulants) long-term use 10/19/2019   Rash and nonspecific skin eruption 10/19/2019   Sjogren's disease ([place]) 10/19/2019   Depression 04/07/2018   Enlarged prostate 04/07/2018   Schizophrenia ([place]) 04/07/2018   Simple renal cyst 04/07/2018   Sleep apnea 04/07/2018   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   Leg pain 10/14/2016   Atherosclerotic peripheral vascular disease with intermittent claudication ([place]) 09/18/2015   Abnormal electrocardiogram 09/01/2015   Joint pain 09/19/2014   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

## 2022-02-14 NOTE — Progress Notes (Signed)
[facility]  Daily Progress Note   Subjective  -   Patient has tachycardia worse with activity.  Hemoglobin has been trending down postsurgery.  Today is noted at 7.1.  Objective Vitals:   02/14/22 1800 02/14/22 1915 02/14/22 2000 02/14/22 2200  BP: (!) 149/93   (!) 129/90  Pulse: (!) 117     Resp: 11  18 16   Temp: 97.9 F (36.6 C) 98.6 F (37 C)    TempSrc: Oral Oral    SpO2: 100%     Weight:      Height:        Intake/Output Summary (Last 24 hours) at 02/14/2022 2306 Last data filed at 02/14/2022 2200 Gross per 24 hour  Intake 2211.13 ml  Output 2050 ml  Net 161.13 ml    PULM  CTAB CV  RRR VASC  palpable PT right foot  Laboratory CBC    Component Value Date/Time   WBC 12.0 (H) 02/14/2022 1100   HGB 8.7 (L) 02/14/2022 1944   HGB 14.4 03/30/2018 1404   HCT 26.4 (L) 02/14/2022 1944   HCT 42.9 03/30/2018 1404   PLT 175 02/14/2022 1100   PLT 321 03/30/2018 1404    BMET    Component Value Date/Time   NA 137 02/14/2022 1100   NA 140 03/30/2018 1404   K 3.9 02/14/2022 1100   CL 106 02/14/2022 1100   CO2 23 02/14/2022 1100   GLUCOSE 162 (H) 02/14/2022 1100   BUN 16 02/14/2022 1100   BUN 9 03/30/2018 1404   CREATININE 0.84 02/14/2022 1100   CALCIUM 8.7 (L) 02/14/2022 1100   GFRNONAA >60 02/14/2022 1100   GFRAA 129 03/30/2018 1404    Assessment/Planning: POD #2 s/p right femoral endarterectomy and embolectomy  Patient is tachycardic but no underlying arrhythmia.  Noted that his hemoglobin is 7.1.  Suspect that this is the cause of his tachycardia.  We will plan on 1 unit packed red blood cell Patient worked with physical therapy today became very tachycardic post again likely result from anemia.  Post transfusion, patient should attempt to sit up in chair as possible. Pain is well controlled. Possible discharge tomorrow if tachycardia is well controlled and hemoglobin is stable.  05/30/2018  02/14/2022, 11:06 PM

## 2022-02-14 NOTE — Progress Notes (Signed)
[facility] Encounter Note  Patient: Jeffrey Grant. / Admit Date: 02/12/2022 / Date of Encounter: 02/14/2022, 12:30 PM   Subjective: The patient has tolerated his surgical intervention of his right femoral graft with no current evidence of complication.  There has been no evidence of congestive heart failure or anginal symptoms or change in EKG.  Telemetry shows sinus tachycardia but no other cocnerns   The patient has been on appropriate medication management for risk reduction  for rehab or complication in rcovery as listed below.  There is no evidence of need for other diagnostic testing   Review of Systems: Positive for: None Negative for: Vision change, hearing change, syncope, dizziness, nausea, vomiting,diarrhea, bloody stool, stomach pain, cough, congestion, diaphoresis, urinary frequency, urinary pain,skin lesions, skin rashes Others previously listed  Objective: Telemetry: sinus tachycardia Physical Exam: Blood pressure 120/88, pulse (!) 105, temperature 97.7 F (36.5 C), temperature source Oral, resp. rate 10, height  (1.753 m), weight 79.4 kg, SpO2 100 %. Body mass index is 25.84 kg/m. General: Well developed, well nourished, in no acute distress. Head: Normocephalic, atraumatic, sclera non-icteric, no xanthomas, nares are without discharge. Neck: No apparent masses Lungs: Normal respirations with no wheezes, no rhonchi, no rales , no crackles   Heart: Regular rate and rhythm, normal S1 S2, no murmur, no rub, no gallop, PMI is normal size and placement, carotid upstroke normal without bruit, jugular venous pressure normal Abdomen: Soft, non-tender, non-distended with normoactive bowel sounds. No hepatosplenomegaly. Abdominal aorta is normal size without bruit Extremities: No edema, no clubbing, no cyanosis, no ulcers,  Peripheral: 2+ radial, 2+ femoral, 1 on + dorsal pedal pulses Neuro: Alert and oriented. Moves all extremities  spontaneously. Psych:  Responds to questions appropriately with a normal affect.   Intake/Output Summary (Last 24 hours) at 02/14/2022 1230 Last data filed at 02/14/2022 0530 Gross per 24 hour  Intake 666.67 ml  Output 1650 ml  Net -983.33 ml     Inpatient Medications:   aspirin EC  81 mg Oral Daily   atorvastatin  80 mg Oral Daily   Chlorhexidine Gluconate Cloth  6 each Topical Daily   clopidogrel  75 mg Oral Daily   docusate sodium  100 mg Oral Daily   DULoxetine  90 mg Oral Daily   enoxaparin (LOVENOX) injection  40 mg Subcutaneous Q24H   hydroxychloroquine  200 mg Oral BID   pregabalin  100 mg Oral BID   Infusions:   sodium chloride 100 mL/hr at 02/13/22 1504   sodium chloride     DOPamine     famotidine (PEPCID) IV 20 mg (02/14/22 0936)   magnesium sulfate bolus IVPB     nitroGLYCERIN      Labs: Recent Labs    02/13/22 1647 02/14/22 1100  NA 137 137  K 4.0 3.9  CL 107 106  CO2 21* 23  GLUCOSE 178* 162*  BUN 14 16  CREATININE 0.87 0.84  CALCIUM 8.6* 8.7*    No results for input(s): "AST", "ALT", "ALKPHOS", "BILITOT", "PROT", "ALBUMIN" in the last 72 hours. Recent Labs    02/13/22 1647 02/14/22 1100  WBC 13.9* 12.0*  HGB 8.2* 7.1*  HCT 24.5* 21.8*  MCV 88.8 88.3  PLT 173 175    No results for input(s): "CKTOTAL", "CKMB", "TROPONINI" in the last 72 hours. Invalid input(s): "POCBNP" No results for input(s): "HGBA1C" in the last 72 hours.   Weights: Filed Weights   02/12/22 0910 02/12/22 1533  Weight: 79.4 kg  79.4 kg     Radiology/Studies:  DG HIP UNILAT WITH PELVIS 2-3 VIEWS RIGHT  Result Date: 02/12/2022 CLINICAL DATA:  Imaging needed for needle count in OR EXAM: DG HIP (WITH OR WITHOUT PELVIS) 2-3V RIGHT COMPARISON:  Radiographs 10/22/2016 FINDINGS: Right THA. New right femoral vascular stent within the medial thigh. Wound VAC. Cutaneous staples. No unexpected radiopaque foreign body. Per discussion with Darla Lesches RN, 6-0 prolene  needle was initially thought to be missing however subsequent counts were normal. IMPRESSION: No unexpected radiopaque foreign body. These results were called by telephone at the time of interpretation on 02/12/2022 at 8:03 pm to provider Darla Lesches RN, who verbally acknowledged these results. Electronically Signed   By: Minerva Fester M.D.   On: 02/12/2022 20:04   PERIPHERAL VASCULAR CATHETERIZATION  Result Date: 02/12/2022 See surgical note for result.  VAS [place] LOWER EXTREMITY ARTERIAL DUPLEX  Result Date: 01/31/2022 LOWER EXTREMITY ARTERIAL DUPLEX STUDY Patient Name:  Jeffrey Grant.  Date of Exam:   01/21/2022 Medical Rec #: [id number]          Accession #:    [id number] Date of Birth: Jul 21, 1980          Patient Gender: M Patient Age:   40 years Exam Location:  [facility] Procedure:      VAS [place] LOWER EXTREMITY ARTERIAL DUPLEX Referring Phys: Sheppard Plumber --------------------------------------------------------------------------------  Indications: Peripheral artery disease. High Risk Factors: Current smoker.  Vascular Interventions: 06/01/21: Aortobifemoral BPG with bilateral CFA/SFA                         endarterectomies;                         06/02/21: Right SFA endarterectomy/angioplasty;. Current ABI:            Right: 0.70, Left : 0.62 Performing Technologist: Hardie Lora RVT  Examination Guidelines: A complete evaluation includes B-mode imaging, spectral Doppler, color Doppler, and power Doppler as needed of all accessible portions of each vessel. Bilateral testing is considered an integral part of a complete examination. Limited examinations for reoccurring indications may be performed as noted.  +----------+--------+-----+---------------+----------+--------+ RIGHT     PSV cm/sRatioStenosis       Waveform  Comments +----------+--------+-----+---------------+----------+--------+ CFA Mid   85                          monophasic          +----------+--------+-----+---------------+----------+--------+ CFA Distal343          50-74% stenosismonophasic         +----------+--------+-----+---------------+----------+--------+ DFA       104                         monophasic         +----------+--------+-----+---------------+----------+--------+ SFA Prox  117                         monophasic         +----------+--------+-----+---------------+----------+--------+ SFA Mid   54                          monophasic         +----------+--------+-----+---------------+----------+--------+ SFA Distal80  monophasic         +----------+--------+-----+---------------+----------+--------+ POP Prox  63                          monophasic         +----------+--------+-----+---------------+----------+--------+ POP Distal49                          monophasic         +----------+--------+-----+---------------+----------+--------+ PTA Prox  26                          monophasic         +----------+--------+-----+---------------+----------+--------+ PTA Distal26                          monophasic         +----------+--------+-----+---------------+----------+--------+ A focal velocity elevation of 343 cm/s was obtained at CFA / Distal ao bifemo with a VR of 4.1. Findings are characteristic of 50-74% stenosis.  Right Stent(s): +---------------+--------+--------+----------+--------+ Prox SFA       PSV cm/sStenosisWaveform  Comments +---------------+--------+--------+----------+--------+ Prox to Stent  110             monophasic         +---------------+--------+--------+----------+--------+ Proximal Stent 104             monophasic         +---------------+--------+--------+----------+--------+ Mid Stent      56              monophasic         +---------------+--------+--------+----------+--------+ Distal Stent   55              monophasic          +---------------+--------+--------+----------+--------+ Distal to Stent82              monophasic         +---------------+--------+--------+----------+--------+    +----------+--------+-----+--------+----------+--------+ LEFT      PSV cm/sRatioStenosisWaveform  Comments +----------+--------+-----+--------+----------+--------+ CFA Distal251                  biphasic           +----------+--------+-----+--------+----------+--------+ DFA       107                  biphasic           +----------+--------+-----+--------+----------+--------+ SFA Prox  119                  monophasic         +----------+--------+-----+--------+----------+--------+ SFA Mid   122                  monophasic         +----------+--------+-----+--------+----------+--------+ SFA Distal132                  monophasic         +----------+--------+-----+--------+----------+--------+ POP Prox  97                   monophasic         +----------+--------+-----+--------+----------+--------+ POP Distal44                   monophasic         +----------+--------+-----+--------+----------+--------+ ATA Distal42  monophasic         +----------+--------+-----+--------+----------+--------+ PTA Distal36                   monophasic         +----------+--------+-----+--------+----------+--------+  Left Stent(s): +---------------+--------+---------------+----------+--------+ Distal SFA     PSV cm/sStenosis       Waveform  Comments +---------------+--------+---------------+----------+--------+ Prox to Stent  83                     monophasic         +---------------+--------+---------------+----------+--------+ Proximal Stent 158                    monophasic         +---------------+--------+---------------+----------+--------+ Mid Stent      88                     monophasic          +---------------+--------+---------------+----------+--------+ Distal Stent   252     50-99% stenosismonophasic         +---------------+--------+---------------+----------+--------+ Distal to Stent114                    monophasic         +---------------+--------+---------------+----------+--------+    Summary: Right: 50-74% stenosis noted in the common femoral artery. Patent stent with no evidence of stenosis in the Proximal superficial femoral artery artery. Left: Stenosis is noted within the Distal superficial femoral artery stent.  See table(s) above for measurements and observations. Electronically signed by [name] MD on 01/31/2022 at 4:06:10 PM.    Final    VAS [place] ABI WITH/WO TBI  Result Date: 01/21/2022  LOWER EXTREMITY DOPPLER STUDY Patient Name:  Jeffrey Burrill JR.  Date of Exam:   01/21/2022 Medical Rec #: [id number]          Accession #:    [id number] Date of Birth: 1980-12-29          Patient Gender: M Patient Age:   34 years Exam Location:  [facility] Procedure:      VAS [place] ABI WITH/WO TBI Referring Phys: Sheppard Plumber --------------------------------------------------------------------------------  Indications: Claudication, and peripheral artery disease. High Risk Factors: Hypertension, current smoker.  Vascular Interventions: 06/01/21: Aortobifemoral BPG with bilateral CFA/SFA                         endarterectomies;                         06/02/21: Right SFA endarterectomy/angioplasty;                          07/24/2021: Left Lower Extremity Angiogram Third order                         catheter placement. Angioplasty and Stent placement Left                         SFA and Popliteal Artery.                          08/07/2021: PTA and Stent placement Right SFA. Performing Technologist: Hardie Lora RVT  Examination Guidelines: A complete evaluation includes at minimum, Doppler waveform signals and systolic blood pressure reading at the level of bilateral  brachial, anterior tibial, and posterior tibial arteries, when vessel segments are accessible. Bilateral testing is considered an integral part of a complete examination. Photoelectric Plethysmograph (PPG) waveforms and toe systolic pressure readings are included as required and additional duplex testing as needed. Limited examinations for reoccurring indications may be performed as noted.  ABI Findings: +---------+------------------+-----+----------+--------+ Right    Rt Pressure (mmHg)IndexWaveform  Comment  +---------+------------------+-----+----------+--------+ Brachial 117                                       +---------+------------------+-----+----------+--------+ ATA      58                0.50 monophasic         +---------+------------------+-----+----------+--------+ PTA      82                0.70 monophasic         +---------+------------------+-----+----------+--------+ PERO     70                0.60 monophasic         +---------+------------------+-----+----------+--------+ Great Toe0                 0.00                    +---------+------------------+-----+----------+--------+ +---------+------------------+-----+----------+-------+ Left     Lt Pressure (mmHg)IndexWaveform  Comment +---------+------------------+-----+----------+-------+ Brachial 108                                      +---------+------------------+-----+----------+-------+ ATA      69                0.59 monophasic        +---------+------------------+-----+----------+-------+ PTA      73                0.62 monophasic        +---------+------------------+-----+----------+-------+ Great Toe62                0.53                   +---------+------------------+-----+----------+-------+ +-------+-----------+-----------+------------+------------+ ABI/TBIToday's ABIToday's TBIPrevious ABIPrevious TBI +-------+-----------+-----------+------------+------------+  Right  0.70       0.00       0.91        0.78         +-------+-----------+-----------+------------+------------+ Left   0.62       0.53       0.92        0.98         +-------+-----------+-----------+------------+------------+ Bilateral ABIs appear decreased compared to prior study on 10/19/2021.  Summary: Right: Resting right ankle-brachial index indicates moderate right lower extremity arterial disease. The right toe-brachial index is abnormal. Left: Resting left ankle-brachial index indicates moderate left lower extremity arterial disease. The left toe-brachial index is abnormal. *See table(s) above for measurements and observations.  Electronically signed by [name] MD on 01/21/2022 at 5:12:23 PM.    Final      Assessment and Recommendation  41 y.o. male with known peripheral vascular disease hypertension hyperlipidemia without evidence of cardiovascular signs or symptoms status post vascular surgery without complication sinus tachycardia may be due to recovery 1.  Continue rehabilitation without restriction 2.  Antiplatelet medication management for peripheral vascular disease  and other coronary artery disease risk reduction 3.  High intensity cholesterol therapy 4.  Begin ambulation and follow-up for improvements of symptoms 5.  No further diagnostic testing needed 6.  Would continue to look for possible causes of sinus tachycardia including recovery complications. As the tachycardia is in response or caused by something else (surgery and rehab?) and not primary issue  7.Call if further questions  Signed, Arnoldo Hooker M.D. FACC

## 2022-02-14 NOTE — Evaluation (Signed)
Physical Therapy Evaluation Patient Details Name: Jeffrey Grant. MRN: [id number] DOB: 11/21/1980 Today's Date: 02/14/2022  History of Present Illness  Jeffrey Grant. is a 40yoM who comes [facility]  on 8/22 for elective procedure to address ongoing occlusive arterial disease. Pt went to OR with vascular surgery for Rt femoral endarterectomy/angioplasty, RLE aortobifemoral bypass graft. Pt  transferred to ICU post procedure, noted to have significant pain and severe hypotension. Per OR report, patient lost 700 cc of blood and received 1 unit of PRBC.  Clinical Impression  Pt admitted with above diagnosis. Pt currently with functional limitations due to the deficits listed below (see "PT Problem List"). Upon entry, pt in bed, awake and agreeable to participate. The pt is alert, pleasant, interactive, and able to provide info regarding prior level of function, both in tolerance and independence. HR tachy at rest n 110s with linear progression in sustained rate with time and position to gravity, low sustained exertion provoking ending AMB HR of 156bbpm, pt without any symptoms of cardiac ischemia. Aside form VS, pt mobilizing very well, no significant pain limitations as far as safety today, just moving a bit slower. Pt will easily advance AMB distance once his VS are more within safe limits. Patient's performance this date reveals decreased ability, independence, and tolerance in performing all basic mobility required for performance of activities of daily living. Pt requires additional DME, close physical assistance, and cues for safe participate in mobility. Pt will benefit from skilled PT intervention to increase independence and safety with basic mobility in preparation for discharge to the venue listed below.     No data found.      Recommendations for follow up therapy are one component of a multi-disciplinary discharge planning process, led by the attending physician.  Recommendations may be  updated based on patient status, additional functional criteria and insurance authorization.  Follow Up Recommendations Outpatient PT ([facility] HHPT services will be difficult to find)      Assistance Recommended at Discharge Set up Supervision/Assistance  Patient can return home with the following  Assistance with cooking/housework;Assist for transportation;Two people to help with bathing/dressing/bathroom;A little help with bathing/dressing/bathroom    Equipment Recommendations None recommended by PT  Recommendations for Other Services       Functional Status Assessment Patient has had a recent decline in their functional status and demonstrates the ability to make significant improvements in function in a reasonable and predictable amount of time.     Precautions / Restrictions Precautions Precautions: None Restrictions Weight Bearing Restrictions: No      Mobility  Bed Mobility Overal bed mobility: Modified Independent                  Transfers Overall transfer level: Modified independent Equipment used: Rolling walker (2 wheels)               General transfer comment: RW for pain control    Ambulation/Gait Ambulation/Gait assistance: Modified independent (Device/Increase time), Supervision Gait Distance (Feet): 120 Feet Assistive device: Rolling walker (2 wheels) Gait Pattern/deviations: WFL(Within Functional Limits) Gait velocity: <0.19m/s     General Gait Details: very slow step-to pattern feels good, but sustaining in 140s with progressive increase to 156 bpm; no dizziness, no CP, no SOB  Stairs            Wheelchair Mobility    Modified Rankin (Stroke Patients Only)       Balance  Pertinent Vitals/Pain Pain Assessment Pain Assessment: 0-10 Pain Score: 6  Pain Location: Rt foot, Rt groin; prior claudication pain much improved since procedure Pain Intervention(s):  Limited activity within patient's tolerance, Monitored during session    Home Living Family/patient expects to be discharged to:: Private residence Living Arrangements: Spouse/significant other Available Help at Discharge: Family;Available 24 hours/day Type of Home: House Home Access: Stairs to enter Entrance Stairs-Rails: Right Entrance Stairs-Number of Steps: 1   Home Layout: One level Home Equipment: Pharmacist, hospital (2 wheels);Cane - single point      Prior Function Prior Level of Function : Independent/Modified Independent             Mobility Comments: Pt reports he was independent without AD for functional mobility ADLs Comments: Pt reports he was independent with ADLs, driving, and playing music     Hand Dominance        Extremity/Trunk Assessment   Upper Extremity Assessment Upper Extremity Assessment: Overall WFL for tasks assessed    Lower Extremity Assessment Lower Extremity Assessment: Overall WFL for tasks assessed    Cervical / Trunk Assessment Cervical / Trunk Assessment: Normal  Communication      Cognition Arousal/Alertness: Awake/alert Behavior During Therapy: WFL for tasks assessed/performed Overall Cognitive Status: Within Functional Limits for tasks assessed                                          General Comments      Exercises     Assessment/Plan    PT Assessment Patient needs continued PT services  PT Problem List Decreased activity tolerance;Decreased balance;Decreased mobility;Decreased range of motion;Decreased knowledge of use of DME;Decreased knowledge of precautions;Cardiopulmonary status limiting activity       PT Treatment Interventions DME instruction;Balance training;Gait training;Stair training;Functional mobility training;Therapeutic activities;Therapeutic exercise;Patient/family education    PT Goals (Current goals can be found in the Care Plan section)  Acute Rehab PT Goals Patient  Stated Goal: improve capacity for pain free mobility PT Goal Formulation: With patient Time For Goal Achievement: 02/28/22 Potential to Achieve Goals: Good    Frequency Min 2X/week     Co-evaluation               AM-PAC PT "6 Clicks" Mobility  Outcome Measure Help needed turning from your back to your side while in a flat bed without using bedrails?: None Help needed moving from lying on your back to sitting on the side of a flat bed without using bedrails?: None Help needed moving to and from a bed to a chair (including a wheelchair)?: None Help needed standing up from a chair using your arms (e.g., wheelchair or bedside chair)?: None Help needed to walk in hospital room?: A Little Help needed climbing 3-5 steps with a railing? : A Little 6 Click Score: 22    End of Session Equipment Utilized During Treatment: Gait belt Activity Tolerance: Patient tolerated treatment well;No increased pain;Treatment limited secondary to medical complications (Comment) (high sustained HR with standig AMB) Patient left: in chair;with call bell/phone within reach Nurse Communication: Mobility status;Precautions (HR) PT Visit Diagnosis: Difficulty in walking, not elsewhere classified (R26.2);Other abnormalities of gait and mobility (R26.89)    Time: 2094-7096 PT Time Calculation (min) (ACUTE ONLY): 20 min   Charges:   PT Evaluation $PT Eval Moderate Complexity: 1 Mod         10:47 AM, 02/14/22 Isaias Cowman  Belva Agee, PT, DPT Physical Therapist - [facility]  [phone] (ASCOM)   Sommer Spickard C 02/14/2022, 10:41 AM

## 2022-02-15 ENCOUNTER — Other Ambulatory Visit (INDEPENDENT_AMBULATORY_CARE_PROVIDER_SITE_OTHER): Payer: Self-pay | Admitting: Nurse Practitioner

## 2022-02-15 DIAGNOSIS — I739 Peripheral vascular disease, unspecified: Secondary | ICD-10-CM

## 2022-02-15 LAB — BASIC METABOLIC PANEL
Anion gap: 6 (ref 5–15)
BUN: 17 mg/dL (ref 6–20)
CO2: 26 mmol/L (ref 22–32)
Calcium: 8.6 mg/dL — ABNORMAL LOW (ref 8.9–10.3)
Chloride: 106 mmol/L (ref 98–111)
Creatinine, Ser: 0.91 mg/dL (ref 0.61–1.24)
GFR, Estimated: >60 mL/min (ref >60)
Glucose, Bld: 181 mg/dL — ABNORMAL HIGH (ref 70–99)
Potassium: 4.1 mmol/L (ref 3.5–5.1)
Sodium: 138 mmol/L (ref 135–145)

## 2022-02-15 LAB — CBC
HCT: 24 % — ABNORMAL LOW (ref 39.0–52.0)
Hemoglobin: 8 g/dL — ABNORMAL LOW (ref 13.0–17.0)
MCH: 28.7 pg (ref 26.0–34.0)
MCHC: 33.3 g/dL (ref 30.0–36.0)
MCV: 86 fL (ref 80.0–100.0)
Platelets: 148 10*3/uL — ABNORMAL LOW (ref 150–400)
RBC: 2.79 MIL/uL — ABNORMAL LOW (ref 4.22–5.81)
RDW: 16.5 % — ABNORMAL HIGH (ref 11.5–15.5)
WBC: 13.9 10*3/uL — ABNORMAL HIGH (ref 4.0–10.5)
nRBC: 0.4 % — ABNORMAL HIGH (ref 0.0–0.2)

## 2022-02-15 LAB — HEMOGLOBIN AND HEMATOCRIT, BLOOD
HCT: 24.9 % — ABNORMAL LOW (ref 39.0–52.0)
Hemoglobin: 8.2 g/dL — ABNORMAL LOW (ref 13.0–17.0)

## 2022-02-15 MED ORDER — OXYCODONE-ACETAMINOPHEN 5-325 MG PO TABS: 1.0000 | ORAL_TABLET | ORAL | 0 refills | Status: DC | PRN

## 2022-02-15 MED ORDER — FERROUS SULFATE 325 (65 FE) MG PO TBEC: 325.0000 mg | DELAYED_RELEASE_TABLET | Freq: Two times a day (BID) | ORAL | 3 refills | Status: DC

## 2022-02-15 NOTE — Discharge Summary (Signed)
[facility]    Discharge Summary    Patient ID:  Jeffrey Grant. MRN: [id number] DOB/AGE: 25-May-1981 41 y.o.  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  Following the angiogram the patient subsequently lost pulses and required emergent intervention including right common femoral superficial femoral endarterectomy.  There is also abdominal embolectomy of his right aortobifem bypass graft.  There is also a redo right groin surgery for exposure of his femoral arteries.  Immediately following the procedure the patient has done well.  He is having some swelling in his thigh but has little pain.  He has been able to get up and work with physical therapy without significant issue.  The patient's only complicating factor is a drop in hemoglobin and he has had some symptomatic anemia.  He remains tachycardic but there is no underlying arrhythmia.  Is  felt that this is largely due to volume depletion.  He denies any chest pain or shortness of breath.  He immediately received a unit of packed red blood cells after surgery and he also received an additional unit yesterday evening.  Overall, the patient is doing well and motivated for discharge currently.  Hospital Course:  Jeffrey NemetzTymon Grant. is a 41 y.o. male is S/P Right femoral endarterectomy Procedure(s): ENDARTERECTOMY FEMORAL APPLICATION OF CELL SAVER APPLICATION OF WOUND VAC EMBOLECTOMY Extubated: POD # 0 Physical exam: Prevena wound vac in place, palpable PT pulse on RLE, right thigh swelling  Post-op wounds clean, dry, intact or healing well Pt. Ambulating, voiding and taking PO diet without difficulty. Pt pain controlled with PO pain meds. Labs as below Complications:Low Hgb  Consults:    Significant Diagnostic Studies: CBC Lab Results  Component Value Date   WBC 13.9 (H) 02/15/2022   HGB 8.2 (L) 02/15/2022   HCT 24.9 (L) 02/15/2022   MCV 86.0 02/15/2022   PLT 148 (L) 02/15/2022    BMET    Component Value Date/Time   NA 138 02/15/2022 0452   NA 140 03/30/2018 1404   K 4.1 02/15/2022 0452   CL 106 02/15/2022 0452   CO2 26 02/15/2022 0452   GLUCOSE 181 (H) 02/15/2022 0452   BUN 17 02/15/2022 0452   BUN 9 03/30/2018 1404   CREATININE 0.91 02/15/2022 0452   CALCIUM 8.6 (L) 02/15/2022 0452   GFRNONAA >60 02/15/2022 0452   GFRAA 129 03/30/2018 1404   COAG Lab Results  Component Value Date   INR 0.9 02/12/2022   INR 0.9 06/01/2021   INR 1.0 05/29/2021     Disposition:  Discharge to :Home Discharge Instructions  Call MD for:  redness, tenderness, or signs of infection (pain, swelling, bleeding, redness, odor or green/yellow discharge around incision site)   Complete by: As directed    Call MD for:  severe or increased pain, loss or decreased feeling  in affected limb(s)   Complete by: As directed    Call MD for:  temperature >100.5   Complete by: As  directed    Discharge instructions   Complete by: As directed    Okay to shower after 36 hours.  Please remove the bandage from the right groin before showering and replace with a Band-Aid daily as needed   Driving Restrictions   Complete by: As directed    No driving for 36 hours   Lifting restrictions   Complete by: As directed    No lifting for 1 week   No dressing needed   Complete by: As directed    Replace only if drainage present   Resume previous diet   Complete by: As directed       Allergies as of 02/15/2022   No Known Allergies      Medication List     STOP taking these medications    oxyCODONE-acetaminophen 7.5-325 MG tablet Commonly known as: Percocet Replaced by: oxyCODONE-acetaminophen 5-325 MG tablet       TAKE these medications    acetaminophen 500 MG tablet Commonly known as: TYLENOL Take 1,000 mg by mouth every 6 (six) hours as needed (pain.).   amLODipine 10 MG tablet Commonly known as: NORVASC Take 0.5 tablets (5 mg total) by mouth in the morning. What changed: how much to take   aspirin EC 81 MG tablet Take 1 tablet (81 mg total) by mouth daily. Swallow whole.   atorvastatin 80 MG tablet Commonly known as: LIPITOR Take 1 tablet (80 mg total) by mouth daily.   cilostazol 100 MG tablet Commonly known as: PLETAL Take 1 tablet (100 mg total) by mouth 2 (two) times daily.   clopidogrel 75 MG tablet Commonly known as: PLAVIX Take 1 tablet (75 mg total) by mouth daily.   DULoxetine 30 MG capsule Commonly known as: CYMBALTA Take 90 mg by mouth daily.   ferrous sulfate 325 (65 FE) MG EC tablet Take 1 tablet (325 mg total) by mouth 2 (two) times daily.   folic acid 400 MCG tablet Commonly known as: FOLVITE Take 400 mcg by mouth daily.   hydroxychloroquine 200 MG tablet Commonly known as: PLAQUENIL Take 200 mg by mouth 2 (two) times daily.   lisinopril 20 MG tablet Commonly known as: ZESTRIL Take 20 mg by mouth daily.    Lyrica 100 MG capsule Generic drug: pregabalin Take 100 mg by mouth 2 (two) times daily.   oxyCODONE-acetaminophen 5-325 MG tablet Commonly known as: PERCOCET/ROXICET Take 1-2 tablets by mouth every 4 (four) hours as needed for moderate pain. Replaces: oxyCODONE-acetaminophen 7.5-325 MG tablet       Verbal and written Discharge instructions given to the patient. Wound care per Discharge AVS  Follow-up Information     Schnier, Latina Craver, MD Follow up in 2 week(s).   Specialties: Vascular Surgery, Cardiology, Radiology, Vascular Surgery Why: follow up after procedure no studies. Your appt is 02/28/22 at 1:45PM. Contact information: [address] [place] [address] [phone]                 Signed: Georgiana Spinner, NP  02/15/2022, 1:29 PM

## 2022-02-15 NOTE — TOC Progression Note (Signed)
Transition of Care (TOC) - Progression Note    Patient Details  Name: Jeffrey Grant. MRN: [id number] Date of Birth: [birth year]  Transition of Care [facility]) CM/SW Contact  Allayne Butcher, RN Phone Number: 02/15/2022, 2:47 PM  Clinical Narrative:    Patient medically cleared for discharge home today.  Patient has ambulatory referral in for [facility], they should reach out to patient to schedule.     Expected Discharge Plan: OP Rehab Barriers to Discharge: Continued Medical Work up  Expected Discharge Plan and Services Expected Discharge Plan: OP Rehab   Discharge Planning Services: CM Consult   Living arrangements for the past 2 months: Single Family Home Expected Discharge Date: 02/15/22               DME Arranged: N/A DME Agency: NA       HH Arranged: NA           Social Determinants of Health (SDOH) Interventions    Readmission Risk Interventions     No data to display

## 2022-02-15 NOTE — Progress Notes (Signed)
Physical Therapy Treatment Patient Details Name: Jeffrey Grant. MRN: [id number] DOB: June 17, 1981 Today's Date: 02/15/2022   History of Present Illness Jeffrey Grant. is a 40yoM who comes to [facility]  on 8/22 for elective procedure to address ongoing occlusive arterial disease. Pt went to OR with vascular surgery for Rt femoral endarterectomy/angioplasty, RLE aortobifemoral bypass graft. Pt  transferred to ICU post procedure, noted to have significant pain and severe hypotension. Per OR report, patient lost 700 cc of blood and received 1 unit of PRBC.    PT Comments    Pt in bed on arrival, agreeable to session, looking forward to getting OOB and AMB again. Pt reports receiving blood shortly after PT session yesterday, updated CBC showing Hb drop to 7.1. Pt tachycardic upon entry, still in 110s-120s resting in bed. Rates up 125-135 at EOB, 140s in standing. Pt has no symptoms of ABLA in session, but moves very slowing during AMB. Pt encouraged to take fluids PO ad lib to help with ongoing volume depletion. Pt able to make 2 AMB attempts today, pace similar to prior day, HR only ~5BPM improved compared to 24 hours prior. Pt pleased with mobility in session. Agreeable to recliner at end of session. Per notes/messages from yesterday, Cardiology and Vascular surgery are aware of acute tachycardia problem.   Recommendations for follow up therapy are one component of a multi-disciplinary discharge planning process, led by the attending physician.  Recommendations may be updated based on patient status, additional functional criteria and insurance authorization.  Follow Up Recommendations  Outpatient PT     Assistance Recommended at Discharge Set up Supervision/Assistance  Patient can return home with the following Assistance with cooking/housework;Assist for transportation;Two people to help with bathing/dressing/bathroom;A little help with bathing/dressing/bathroom   Equipment Recommendations  None  recommended by PT    Recommendations for Other Services       Precautions / Restrictions Precautions Precautions: None Restrictions Weight Bearing Restrictions: No     Mobility  Bed Mobility Overal bed mobility: Modified Independent                  Transfers Overall transfer level: Modified independent Equipment used: Rolling walker (2 wheels)               General transfer comment: RW for pain control prn    Ambulation/Gait Ambulation/Gait assistance: Modified independent (Device/Increase time), Supervision Gait Distance (Feet): 120 Feet Assistive device: Rolling walker (2 wheels) Gait Pattern/deviations: WFL(Within Functional Limits) Gait velocity: 0.12m/s     General Gait Details: similar HR response to previous day, linear progressive HR increase with time in vertical position, AMB twice with break between; peak HR at 151 first time, then 154 at second time.   Stairs             Wheelchair Mobility    Modified Rankin (Stroke Patients Only)       Balance                                            Cognition Arousal/Alertness: Awake/alert Behavior During Therapy: WFL for tasks assessed/performed Overall Cognitive Status: Within Functional Limits for tasks assessed  Exercises Other Exercises Other Exercises: 5x STS from ICU bed with RW as needed, self paced    General Comments        Pertinent Vitals/Pain Pain Assessment Pain Assessment:  (well managed per pt, analgesics helping a lot)    Home Living                          Prior Function            PT Goals (current goals can now be found in the care plan section) Acute Rehab PT Goals Patient Stated Goal: improve capacity for pain free mobility PT Goal Formulation: With patient Time For Goal Achievement: 02/28/22 Potential to Achieve Goals: Good Progress towards PT goals:  Progressing toward goals    Frequency    Min 2X/week      PT Plan Current plan remains appropriate    Co-evaluation              AM-PAC PT "6 Clicks" Mobility   Outcome Measure  Help needed turning from your back to your side while in a flat bed without using bedrails?: None Help needed moving from lying on your back to sitting on the side of a flat bed without using bedrails?: None Help needed moving to and from a bed to a chair (including a wheelchair)?: None Help needed standing up from a chair using your arms (e.g., wheelchair or bedside chair)?: None Help needed to walk in hospital room?: A Little Help needed climbing 3-5 steps with a railing? : A Little 6 Click Score: 22    End of Session Equipment Utilized During Treatment: Gait belt Activity Tolerance: Patient tolerated treatment well;No increased pain;Treatment limited secondary to medical complications (Comment) Patient left: in chair;with call bell/phone within reach Nurse Communication: Mobility status;Precautions PT Visit Diagnosis: Difficulty in walking, not elsewhere classified (R26.2);Other abnormalities of gait and mobility (R26.89)     Time: 1610-9604 PT Time Calculation (min) (ACUTE ONLY): 26 min  Charges:  $Therapeutic Activity: 23-37 mins                    12:51 PM, 02/15/22 Rosamaria Lints, PT, DPT Physical Therapist - [facility]  [phone] (ASCOM)    Meliyah Simon C 02/15/2022, 12:47 PM

## 2022-02-15 NOTE — Progress Notes (Signed)
Negative pressure dressing switched over to Prevena.  Discharge teaching completed with patient who is in stable condition and has all belongings.

## 2022-02-19 ENCOUNTER — Ambulatory Visit: Admit: 2022-02-19 | Payer: Medicaid Other | Admitting: Vascular Surgery

## 2022-02-19 DIAGNOSIS — I70219 Atherosclerosis of native arteries of extremities with intermittent claudication, unspecified extremity: Secondary | ICD-10-CM

## 2022-02-19 LAB — TYPE AND SCREEN
ABO/RH(D): O POS
Antibody Screen: NEGATIVE
Unit division: 0
Unit division: 0

## 2022-02-19 LAB — BPAM RBC
Blood Product Expiration Date: 202309042359
Blood Product Expiration Date: 202309062359
ISSUE DATE / TIME: 202308240232
ISSUE DATE / TIME: 202308241441
Unit Type and Rh: 5100
Unit Type and Rh: 5100

## 2022-02-19 LAB — PREPARE RBC (CROSSMATCH)

## 2022-02-19 SURGERY — LOWER EXTREMITY ANGIOGRAPHY
Anesthesia: Moderate Sedation | Site: Leg Lower | Laterality: Right

## 2022-02-20 ENCOUNTER — Telehealth (INDEPENDENT_AMBULATORY_CARE_PROVIDER_SITE_OTHER): Payer: Self-pay

## 2022-02-23 ENCOUNTER — Other Ambulatory Visit: Payer: Self-pay

## 2022-02-23 ENCOUNTER — Encounter: Payer: Self-pay | Admitting: Anesthesiology

## 2022-02-23 ENCOUNTER — Inpatient Hospital Stay
Admission: EM | Admit: 2022-02-23 | Discharge: 2022-03-01 | DRG: 272 | Disposition: A | Discharge status: Home / Self Care | Payer: Medicaid Other | Attending: Vascular Surgery | Admitting: Vascular Surgery

## 2022-02-23 ENCOUNTER — Emergency Department: Payer: Medicaid Other

## 2022-02-23 ENCOUNTER — Encounter: Payer: Self-pay | Admitting: Surgery

## 2022-02-23 DIAGNOSIS — Z9889 Other specified postprocedural states: Secondary | ICD-10-CM | POA: Diagnosis not present

## 2022-02-23 DIAGNOSIS — R0602 Shortness of breath: Secondary | ICD-10-CM | POA: Diagnosis present

## 2022-02-23 DIAGNOSIS — T82898A Other specified complication of vascular prosthetic devices, implants and grafts, initial encounter: Secondary | ICD-10-CM | POA: Diagnosis not present

## 2022-02-23 DIAGNOSIS — D649 Anemia, unspecified: Secondary | ICD-10-CM | POA: Diagnosis present

## 2022-02-23 DIAGNOSIS — Z20822 Contact with and (suspected) exposure to covid-19: Secondary | ICD-10-CM | POA: Diagnosis present

## 2022-02-23 DIAGNOSIS — R112 Nausea with vomiting, unspecified: Secondary | ICD-10-CM | POA: Diagnosis not present

## 2022-02-23 DIAGNOSIS — M059 Rheumatoid arthritis with rheumatoid factor, unspecified: Secondary | ICD-10-CM | POA: Diagnosis present

## 2022-02-23 DIAGNOSIS — I739 Peripheral vascular disease, unspecified: Secondary | ICD-10-CM | POA: Diagnosis present

## 2022-02-23 DIAGNOSIS — I724 Aneurysm of artery of lower extremity: Principal | ICD-10-CM | POA: Diagnosis present

## 2022-02-23 DIAGNOSIS — Z87891 Personal history of nicotine dependence: Secondary | ICD-10-CM

## 2022-02-23 DIAGNOSIS — S8011XA Contusion of right lower leg, initial encounter: Principal | ICD-10-CM

## 2022-02-23 DIAGNOSIS — I743 Embolism and thrombosis of arteries of the lower extremities: Secondary | ICD-10-CM | POA: Diagnosis not present

## 2022-02-23 DIAGNOSIS — E7800 Pure hypercholesterolemia, unspecified: Secondary | ICD-10-CM | POA: Diagnosis present

## 2022-02-23 DIAGNOSIS — I10 Essential (primary) hypertension: Secondary | ICD-10-CM | POA: Diagnosis present

## 2022-02-23 LAB — CBC WITH DIFFERENTIAL/PLATELET
Abs Immature Granulocytes: 0.48 10*3/uL — ABNORMAL HIGH (ref 0.00–0.07)
Basophils Absolute: 0.1 10*3/uL (ref 0.0–0.1)
Basophils Relative: 0 %
Eosinophils Absolute: 0.6 10*3/uL — ABNORMAL HIGH (ref 0.0–0.5)
Eosinophils Relative: 4 %
HCT: 25.9 % — ABNORMAL LOW (ref 39.0–52.0)
Hemoglobin: 8.2 g/dL — ABNORMAL LOW (ref 13.0–17.0)
Immature Granulocytes: 3 %
Lymphocytes Relative: 15 %
Lymphs Abs: 2.4 10*3/uL (ref 0.7–4.0)
MCH: 29.1 pg (ref 26.0–34.0)
MCHC: 31.7 g/dL (ref 30.0–36.0)
MCV: 91.8 fL (ref 80.0–100.0)
Monocytes Absolute: 0.9 10*3/uL (ref 0.1–1.0)
Monocytes Relative: 5 %
Neutro Abs: 11.8 10*3/uL — ABNORMAL HIGH (ref 1.7–7.7)
Neutrophils Relative %: 73 %
Platelets: 529 10*3/uL — ABNORMAL HIGH (ref 150–400)
RBC: 2.82 MIL/uL — ABNORMAL LOW (ref 4.22–5.81)
RDW: 16.1 % — ABNORMAL HIGH (ref 11.5–15.5)
WBC: 16.2 10*3/uL — ABNORMAL HIGH (ref 4.0–10.5)
nRBC: 0.8 % — ABNORMAL HIGH (ref 0.0–0.2)

## 2022-02-23 LAB — TROPONIN I (HIGH SENSITIVITY)
Troponin I (High Sensitivity): 5 ng/L (ref <18)
Troponin I (High Sensitivity): 4 ng/L (ref <18)

## 2022-02-23 LAB — URINALYSIS, ROUTINE W REFLEX MICROSCOPIC
Bilirubin Urine: NEGATIVE
Glucose, UA: NEGATIVE mg/dL
Hgb urine dipstick: NEGATIVE
Ketones, ur: NEGATIVE mg/dL
Leukocytes,Ua: NEGATIVE
Nitrite: NEGATIVE
Protein, ur: NEGATIVE mg/dL
Specific Gravity, Urine: 1.034 — ABNORMAL HIGH (ref 1.005–1.030)
pH: 7 (ref 5.0–8.0)

## 2022-02-23 LAB — CK: Total CK: 80 U/L (ref 49–397)

## 2022-02-23 LAB — TYPE AND SCREEN
ABO/RH(D): O POS
Antibody Screen: NEGATIVE

## 2022-02-23 LAB — COMPREHENSIVE METABOLIC PANEL
ALT: 24 U/L (ref 0–44)
AST: 45 U/L — ABNORMAL HIGH (ref 15–41)
Albumin: 4.2 g/dL (ref 3.5–5.0)
Alkaline Phosphatase: 75 U/L (ref 38–126)
Anion gap: 10 (ref 5–15)
BUN: 14 mg/dL (ref 6–20)
CO2: 23 mmol/L (ref 22–32)
Calcium: 9 mg/dL (ref 8.9–10.3)
Chloride: 104 mmol/L (ref 98–111)
Creatinine, Ser: 0.97 mg/dL (ref 0.61–1.24)
GFR, Estimated: >60 mL/min (ref >60)
Glucose, Bld: 164 mg/dL — ABNORMAL HIGH (ref 70–99)
Potassium: 4.4 mmol/L (ref 3.5–5.1)
Sodium: 137 mmol/L (ref 135–145)
Total Bilirubin: 0.8 mg/dL (ref 0.3–1.2)
Total Protein: 8.1 g/dL (ref 6.5–8.1)

## 2022-02-23 LAB — PROTIME-INR
INR: 1 (ref 0.8–1.2)
Prothrombin Time: 13.2 seconds (ref 11.4–15.2)

## 2022-02-23 LAB — LACTIC ACID, PLASMA
Lactic Acid, Venous: 2 mmol/L (ref 0.5–1.9)
Lactic Acid, Venous: 1.2 mmol/L (ref 0.5–1.9)

## 2022-02-23 LAB — APTT: aPTT: 35 seconds (ref 24–36)

## 2022-02-23 LAB — SARS CORONAVIRUS 2 BY RT PCR: SARS Coronavirus 2 by RT PCR: NEGATIVE

## 2022-02-23 MED ORDER — SENNA 8.6 MG PO TABS
1.0000 | ORAL_TABLET | Freq: Two times a day (BID) | ORAL | Status: DC
  Administered 2022-02-23 – 2022-02-27 (×7): 8.6 mg via ORAL
  Filled 2022-02-23 (×11): qty 1

## 2022-02-23 MED ORDER — AMLODIPINE BESYLATE 5 MG PO TABS
5.0000 mg | ORAL_TABLET | Freq: Every morning | ORAL | Status: DC
  Administered 2022-02-24: 5 mg via ORAL
  Filled 2022-02-23: qty 1

## 2022-02-23 MED ORDER — LABETALOL HCL 5 MG/ML IV SOLN: 10.0000 mg | INTRAVENOUS | Status: DC | PRN

## 2022-02-23 MED ORDER — DOCUSATE SODIUM 100 MG PO CAPS
100.0000 mg | ORAL_CAPSULE | Freq: Two times a day (BID) | ORAL | Status: DC
  Administered 2022-02-23 – 2022-02-27 (×7): 100 mg via ORAL
  Filled 2022-02-23 (×11): qty 1

## 2022-02-23 MED ORDER — LISINOPRIL 20 MG PO TABS
20.0000 mg | ORAL_TABLET | Freq: Every day | ORAL | Status: DC
  Administered 2022-02-24 – 2022-03-01 (×6): 20 mg via ORAL
  Filled 2022-02-23 (×6): qty 1

## 2022-02-23 MED ORDER — METOPROLOL TARTRATE 5 MG/5ML IV SOLN: 2.0000 mg | INTRAVENOUS | Status: DC | PRN

## 2022-02-23 MED ORDER — GUAIFENESIN-DM 100-10 MG/5ML PO SYRP: 15.0000 mL | ORAL_SOLUTION | ORAL | Status: DC | PRN

## 2022-02-23 MED ORDER — PHENOL 1.4 % MT LIQD: 1.0000 | OROMUCOSAL | Status: DC | PRN

## 2022-02-23 MED ORDER — HYDROXYCHLOROQUINE SULFATE 200 MG PO TABS
200.0000 mg | ORAL_TABLET | Freq: Two times a day (BID) | ORAL | Status: DC
  Administered 2022-02-23 – 2022-03-01 (×12): 200 mg via ORAL
  Filled 2022-02-23 (×12): qty 1

## 2022-02-23 MED ORDER — ATORVASTATIN CALCIUM 80 MG PO TABS
80.0000 mg | ORAL_TABLET | Freq: Every day | ORAL | Status: DC
  Administered 2022-02-24 – 2022-03-01 (×6): 80 mg via ORAL
  Filled 2022-02-23 (×6): qty 1

## 2022-02-23 MED ORDER — ASPIRIN 81 MG PO TBEC
81.0000 mg | DELAYED_RELEASE_TABLET | Freq: Every day | ORAL | Status: DC
  Administered 2022-02-24 – 2022-03-01 (×6): 81 mg via ORAL
  Filled 2022-02-23 (×6): qty 1

## 2022-02-23 MED ORDER — IOHEXOL 350 MG/ML SOLN
150.0000 mL | Freq: Once | INTRAVENOUS | Status: AC | PRN
  Administered 2022-02-23: 75 mL via INTRAVENOUS

## 2022-02-23 MED ORDER — MORPHINE SULFATE (PF) 2 MG/ML IV SOLN
2.0000 mg | INTRAVENOUS | Status: DC | PRN
  Administered 2022-02-23: 4 mg via INTRAVENOUS
  Administered 2022-02-23: 2 mg via INTRAVENOUS
  Administered 2022-02-23 – 2022-02-24 (×6): 5 mg via INTRAVENOUS
  Administered 2022-02-24 – 2022-02-26 (×8): 4 mg via INTRAVENOUS
  Filled 2022-02-23: qty 3
  Filled 2022-02-23 (×4): qty 2
  Filled 2022-02-23: qty 1
  Filled 2022-02-23 (×2): qty 3
  Filled 2022-02-23: qty 2
  Filled 2022-02-23: qty 3
  Filled 2022-02-23 (×3): qty 2
  Filled 2022-02-23: qty 3
  Filled 2022-02-23: qty 2
  Filled 2022-02-23: qty 3

## 2022-02-23 MED ORDER — HYDROMORPHONE HCL 1 MG/ML IJ SOLN
0.5000 mg | Freq: Once | INTRAMUSCULAR | Status: AC
  Administered 2022-02-23: 0.5 mg via INTRAVENOUS
  Filled 2022-02-23: qty 0.5

## 2022-02-23 MED ORDER — ONDANSETRON HCL 4 MG/2ML IJ SOLN: 4.0000 mg | Freq: Four times a day (QID) | INTRAMUSCULAR | Status: DC | PRN

## 2022-02-23 MED ORDER — OXYCODONE HCL 5 MG PO TABS
5.0000 mg | ORAL_TABLET | ORAL | Status: DC | PRN
  Administered 2022-02-23: 5 mg via ORAL
  Administered 2022-02-24 – 2022-02-25 (×3): 10 mg via ORAL
  Filled 2022-02-23 (×3): qty 2
  Filled 2022-02-23: qty 1

## 2022-02-23 MED ORDER — POTASSIUM CHLORIDE CRYS ER 20 MEQ PO TBCR: 20.0000 meq | EXTENDED_RELEASE_TABLET | Freq: Once | ORAL | Status: DC

## 2022-02-23 MED ORDER — DULOXETINE HCL 30 MG PO CPEP
90.0000 mg | ORAL_CAPSULE | Freq: Every day | ORAL | Status: DC
  Administered 2022-02-24 – 2022-03-01 (×6): 90 mg via ORAL
  Filled 2022-02-23 (×6): qty 3

## 2022-02-23 MED ORDER — FERROUS SULFATE 325 (65 FE) MG PO TABS
325.0000 mg | ORAL_TABLET | Freq: Two times a day (BID) | ORAL | Status: DC
  Administered 2022-02-23 – 2022-03-01 (×11): 325 mg via ORAL
  Filled 2022-02-23 (×11): qty 1

## 2022-02-23 MED ORDER — ALUM & MAG HYDROXIDE-SIMETH 200-200-20 MG/5ML PO SUSP: 15.0000 mL | ORAL | Status: DC | PRN

## 2022-02-23 MED ORDER — PREGABALIN 50 MG PO CAPS
100.0000 mg | ORAL_CAPSULE | Freq: Two times a day (BID) | ORAL | Status: DC
  Administered 2022-02-23 – 2022-03-01 (×12): 100 mg via ORAL
  Filled 2022-02-23 (×12): qty 2

## 2022-02-23 MED ORDER — SODIUM CHLORIDE 0.9 % IV BOLUS
500.0000 mL | Freq: Once | INTRAVENOUS | Status: AC
  Administered 2022-02-23: 500 mL via INTRAVENOUS

## 2022-02-23 MED ORDER — HYDRALAZINE HCL 20 MG/ML IJ SOLN: 5.0000 mg | INTRAMUSCULAR | Status: DC | PRN

## 2022-02-23 MED ORDER — PANTOPRAZOLE SODIUM 40 MG PO TBEC
40.0000 mg | DELAYED_RELEASE_TABLET | Freq: Every day | ORAL | Status: DC
  Administered 2022-02-23 – 2022-03-01 (×7): 40 mg via ORAL
  Filled 2022-02-23 (×7): qty 1

## 2022-02-23 MED ORDER — ACETAMINOPHEN 500 MG PO TABS: 1000.0000 mg | ORAL_TABLET | Freq: Four times a day (QID) | ORAL | Status: DC | PRN

## 2022-02-23 MED ORDER — FOLIC ACID 1 MG PO TABS
500.0000 ug | ORAL_TABLET | Freq: Every day | ORAL | Status: DC
  Administered 2022-02-24 – 2022-03-01 (×6): 0.5 mg via ORAL
  Filled 2022-02-23 (×6): qty 1

## 2022-02-23 NOTE — H&P (Signed)
[facility]ist of [place]  Patient name: Jeffrey Grant. MRN: [id number] DOB: 04-16-1981 Sex: male   REQUESTING PROVIDER:    ER   REASON FOR CONSULT:    Right thigh pain  HISTORY OF PRESENT ILLNESS:   Jeffrey Grant. is a 41 y.o. male, who has undergone the following vascular procedures:  06/01/2021: Aortobifemoral bypass graft (12 x 6) with bilateral femoral endarterectomy for rest pain and ulceration 06/02/2021: Right superficial femoral artery endarterectomy with CorMatrix patch angioplasty 07/24/2021: Angioplasty and stent placement of the left superficial femoral and popliteal artery 08/07/2021: Angioplasty and stent placement, right superficial femoral artery 02/13/2022: Right femoral endarterectomy with CorMatrix patch angioplasty, thrombectomy of right limb of aortobifemoral graft  The patient presented to the emergency department this morning with shortness of breath and right leg pain.  He states that his right thigh began getting harder and more tender 2 days ago.  He denies any trauma.  There has not been any drainage from his incision.  The patient is medically managed for hypertension.  He takes a statin for hypercholesterolemia.  He is a former smoker   PAST MEDICAL HISTORY    Past Medical History:  Diagnosis Date   Anxiety    Atherosclerotic PVD with intermittent claudication (HCC)    Back pain    Collagen vascular disease (HCC)    Dyspnea    Dysrhythmia    Elevated lipids    Hypertension    Nausea    Seropositive rheumatoid arthritis (HCC)      FAMILY HISTORY   No family history on file.  SOCIAL HISTORY:   Social History   Socioeconomic History   Marital status: Married    Spouse name: Not on file   Number of children: Not on file   Years of education: Not on file   Highest education level: Not on file  Occupational History   Not on file  Tobacco Use   Smoking status: Former     Packs/day: 0.25    Years: 16.00    Total pack years: 4.00    Types: Cigarettes    Quit date: 06/04/2021    Years since quitting: 0.7   Smokeless tobacco: Never  Vaping Use   Vaping Use: Never used  Substance and Sexual Activity   Alcohol use: No   Drug use: Not Currently   Sexual activity: Yes    Partners: Female  Other Topics Concern   Not on file  Social History Narrative   Not on file   Social Determinants of Health   Financial Resource Strain: Not on file  Food Insecurity: Not on file  Transportation Needs: Not on file  Physical Activity: Not on file  Stress: Not on file  Social Connections: Not on file  Intimate Partner Violence: Not on file    ALLERGIES:    No Known Allergies  CURRENT MEDICATIONS:    Current Facility-Administered Medications  Medication Dose Route Frequency Provider Last Rate Last Admin   acetaminophen (TYLENOL) tablet 1,000 mg  1,000 mg Oral Q6H PRN Nada Libman, MD       alum & mag hydroxide-simeth (MAALOX/MYLANTA) 200-200-20 MG/5ML suspension 15-30 mL  15-30 mL Oral Q2H PRN Nada Libman, MD       [START ON 02/24/2022] amLODipine (NORVASC) tablet 5 mg  5 mg Oral q AM Nada Libman, MD       [START ON 02/24/2022] aspirin EC tablet 81 mg  81 mg Oral Daily Nada Libman,  MD       [START ON 02/24/2022] atorvastatin (LIPITOR) tablet 80 mg  80 mg Oral Daily Nada Libman, MD       docusate sodium (COLACE) capsule 100 mg  100 mg Oral BID Nada Libman, MD       [START ON 02/24/2022] DULoxetine (CYMBALTA) DR capsule 90 mg  90 mg Oral Daily Nada Libman, MD       ferrous sulfate tablet 325 mg  325 mg Oral BID WC Nada Libman, MD       [START ON 02/24/2022] folic acid (FOLVITE) tablet 0.5 mg  500 mcg Oral Daily Nada Libman, MD       guaiFENesin-dextromethorphan (ROBITUSSIN DM) 100-10 MG/5ML syrup 15 mL  15 mL Oral Q4H PRN Nada Libman, MD       hydrALAZINE (APRESOLINE) injection 5 mg  5 mg Intravenous Q20 Min PRN Nada Libman, MD       hydroxychloroquine (PLAQUENIL) tablet 200 mg  200 mg Oral BID Nada Libman, MD       labetalol (NORMODYNE) injection 10 mg  10 mg Intravenous Q10 min PRN Nada Libman, MD       [START ON 02/24/2022] lisinopril (ZESTRIL) tablet 20 mg  20 mg Oral Daily Nada Libman, MD       metoprolol tartrate (LOPRESSOR) injection 2-5 mg  2-5 mg Intravenous Q2H PRN Nada Libman, MD       morphine (PF) 2 MG/ML injection 2-5 mg  2-5 mg Intravenous Q1H PRN Nada Libman, MD       ondansetron [facility]) injection 4 mg  4 mg Intravenous Q6H PRN Nada Libman, MD       oxyCODONE (Oxy IR/ROXICODONE) immediate release tablet 5-10 mg  5-10 mg Oral Q4H PRN Nada Libman, MD       pantoprazole (PROTONIX) EC tablet 40 mg  40 mg Oral Daily Nada Libman, MD       phenol (CHLORASEPTIC) mouth spray 1 spray  1 spray Mouth/Throat PRN Nada Libman, MD       potassium chloride SA (KLOR-CON M) CR tablet 20-40 mEq  20-40 mEq Oral Once Nada Libman, MD       pregabalin (LYRICA) capsule 100 mg  100 mg Oral BID Nada Libman, MD       senna (SENOKOT) tablet 8.6 mg  1 tablet Oral BID Nada Libman, MD        REVIEW OF SYSTEMS:    denotes positive finding,  denotes negative finding Cardiac  Comments:  Chest pain or chest pressure:    Shortness of breath upon exertion:    Short of breath when lying flat:    Irregular heart rhythm:        Vascular    Pain in calf, thigh, or hip brought on by ambulation:    Pain in feet at night that wakes you up from your sleep:     Blood clot in your veins:    Leg swelling:         Pulmonary    Oxygen at home:    Productive cough:     Wheezing:         Neurologic    Sudden weakness in arms or legs:     Sudden numbness in arms or legs:     Sudden onset of difficulty speaking or slurred speech:    Temporary loss of vision in one eye:  Problems with dizziness:         Gastrointestinal    Blood in stool:      Vomited  blood:         Genitourinary    Burning when urinating:     Blood in urine:        Psychiatric    Major depression:         Hematologic    Bleeding problems:    Problems with blood clotting too easily:        Skin    Rashes or ulcers:        Constitutional    Fever or chills:     PHYSICAL EXAM:   Vitals:   02/23/22 1400 02/23/22 1430 02/23/22 1500 02/23/22 1610  BP: (!) 132/95 128/86  116/82  Pulse: (!) 107 (!) 103  (!) 106  Resp: 17 13  16   Temp:   98.5 F (36.9 C) 98.3 F (36.8 C)  TempSrc:   Oral Oral  SpO2: 100% 100%  100%    GENERAL: The patient is a well-nourished male, in no acute distress. The vital signs are documented above. CARDIAC: There is a regular rate and rhythm.  VASCULAR: Firmness to the right thigh which is not pulsatile.  The incision is intact.  There is no drainage.  His feet are warm and well-perfused. PULMONARY: Nonlabored respirations ABDOMEN: Soft and non-tender with normal pitched bowel sounds.  MUSCULOSKELETAL: There are no major deformities or cyanosis. NEUROLOGIC: No focal weakness or paresthesias are detected. SKIN: There are no ulcers or rashes noted. PSYCHIATRIC: The patient has a normal affect.  STUDIES:   I have reviewed the following CT: VASCULAR   Postsurgical changes of recent right groin surgery for femoral artery exposure and endarterectomy. Contrast blush versus surgical material along the anterior aspect of the aortofemoral bypass anastomosis. Edema and fluid in the adjacent right groin/proximal anterior thigh which may represent a hematoma, measuring up to 4.9 x 4.9 cm in the axial plane. If true contrast blush, this is concerning for active bleeding. There is either a tiny 3 x 4 mm anterior pseudoaneurysm or tiny branch vessel just distal to the anastomosis. Recommend vascular surgery consultation.   New enlargement and rim enhancing hypoattenuation along the course of the proximal to mid right sartorius muscle,  could represent focal muscle ischemia or intramuscular hematoma. Recommend correlation with creatine kinase.   Patent right SFA stent with unchanged multifocal stenoses of the distal SFA, popliteal artery and tibioperoneal trunk. Unchanged 2 vessel runoff via the posterior tibial and peroneal arteries with unchanged occlusion of the anterior tibial artery.   Patent aortobifemoral bypass graft without evidence of aneurysm or dissection.   Patent left SFA/popliteal artery stent, though with some mural thrombus resulting in multifocal moderate stenosis. Stable 2 vessel runoff on the left via the posterior tibial and peroneal arteries with occluded anterior tibial artery.   NONVASCULAR   No acute findings in the chest, abdomen, or pelvis. No pulmonary embolism.    ASSESSMENT and PLAN   Right thigh pain: There is a lot of artifact on the CT scan from his hip repair.  There is the possibility of a pseudoaneurysm.  This is not completely clear on his CT scan.  Therefore I think he warrants admission to monitor his right thigh to make sure it is not getting any bigger.  I talked about the possibility of going back to the operating room for surgical evaluation.  I will see how he does  overnight.  He may require repeat imaging tomorrow to fully exclude a pseudoaneurysm.  This probably will be better imaged with ultrasound since CT was not definitive.   Charlena Cross, MD, FACS [facility] [phone] Pager [phone]

## 2022-02-23 NOTE — ED Notes (Signed)
Patient to CT at this time

## 2022-02-23 NOTE — ED Notes (Signed)
IV access attempted by this RN and Beecher Mcardle, RN

## 2022-02-23 NOTE — ED Notes (Signed)
Pharmacy tech at bedside 

## 2022-02-23 NOTE — ED Notes (Signed)
Meds not given at this time due to not being verified by pharmacy yet.

## 2022-02-23 NOTE — ED Provider Notes (Signed)
[facility] Provider Note    Event Date/Time   First MD Initiated Contact with Patient 02/23/22 1108     (approximate)   History   Shortness of Breath and Leg Pain   HPI  Jeffrey Bartholomew. is a 41 y.o. male with peripheral artery disease status post endarterectomy on 02/19/2022 who comes in with concerns for shortness of breath and leg pain.  Patient reports undergoing a right femoral endarterectomy with wound VAC placed on the right upper thigh.  Patient does have a history of atrial biifemoral bypass graft as well.  He reports going to the hospital about a week ago but 2 days ago he had sudden onset of worsening pain in his right leg.  Also reports increasing shortness of breath.  Denies any falls or hitting his head.  They do report he had some issues with low blood levels in the hospital requiring transfusions.  They have been taking oxycodone for pain without relief in symptoms.  Physical Exam   Triage Vital Signs: ED Triage Vitals  Enc Vitals Group     BP 02/23/22 1057 121/87     Pulse Rate 02/23/22 1057 (!) 108     Resp 02/23/22 1057 19     Temp 02/23/22 1057 98 F (36.7 C)     Temp Source 02/23/22 1057 Oral     SpO2 02/23/22 1057 100 %     Weight --      Height --      Head Circumference --      Peak Flow --      Pain Score 02/23/22 1058 10     Pain Loc --      Pain Edu? --      Excl. in GC? --     Most recent vital signs: Vitals:   02/23/22 1057  BP: 121/87  Pulse: (!) 108  Resp: 19  Temp: 98 F (36.7 C)  SpO2: 100%     General: Awake, no distress.  CV:  Good peripheral perfusion.  Resp:  Normal effort.  Abd:  No distention.  Other:  Right leg is cool.  Slightly discolored compared to the left although patient reports this has been baseline since the surgery.  Still able to wiggle his toes.  He is got wound VAC noted on the right upper thigh.   ED Results / Procedures / Treatments   Labs (all labs ordered are listed, but  only abnormal results are displayed) Labs Reviewed  CBC WITH DIFFERENTIAL/PLATELET  COMPREHENSIVE METABOLIC PANEL  LACTIC ACID, PLASMA  LACTIC ACID, PLASMA     EKG  My interpretation of EKG:  Sinus tachycardia rate of 108 without any ST elevation or T wave inversions, normal intervals  RADIOLOGY I have reviewed the CT PE  personally interpreted and no evidence of pulmonary embolism.   PROCEDURES:  Critical Care performed: Yes, see critical care procedure note(s)  .1-3 Lead EKG Interpretation  Performed by: Concha Se, MD Authorized by: Concha Se, MD     Interpretation: normal     ECG rate:  90   ECG rate assessment: normal     Rhythm: sinus rhythm     Ectopy: none     Conduction: normal      MEDICATIONS ORDERED IN ED: Medications  potassium chloride SA (KLOR-CON M) CR tablet 20-40 mEq (has no administration in time range)  ondansetron (ZOFRAN) injection 4 mg (has no administration in time range)  alum & mag hydroxide-simeth (  MAALOX/MYLANTA) 200-200-20 MG/5ML suspension 15-30 mL (has no administration in time range)  pantoprazole (PROTONIX) EC tablet 40 mg (has no administration in time range)  labetalol (NORMODYNE) injection 10 mg (has no administration in time range)  hydrALAZINE (APRESOLINE) injection 5 mg (has no administration in time range)  metoprolol tartrate (LOPRESSOR) injection 2-5 mg (has no administration in time range)  guaiFENesin-dextromethorphan (ROBITUSSIN DM) 100-10 MG/5ML syrup 15 mL (has no administration in time range)  phenol (CHLORASEPTIC) mouth spray 1 spray (has no administration in time range)  oxyCODONE (Oxy IR/ROXICODONE) immediate release tablet 5-10 mg (has no administration in time range)  morphine (PF) 2 MG/ML injection 2-5 mg (has no administration in time range)  docusate sodium (COLACE) capsule 100 mg (has no administration in time range)  senna (SENOKOT) tablet 8.6 mg (has no administration in time range)  acetaminophen  (TYLENOL) tablet 1,000 mg (has no administration in time range)  amLODipine (NORVASC) tablet 5 mg (has no administration in time range)  aspirin EC tablet 81 mg (has no administration in time range)  atorvastatin (LIPITOR) tablet 80 mg (has no administration in time range)  DULoxetine (CYMBALTA) DR capsule 90 mg (has no administration in time range)  ferrous sulfate EC tablet 325 mg (has no administration in time range)  folic acid (FOLVITE) tablet 400 mcg (has no administration in time range)  hydroxychloroquine (PLAQUENIL) tablet 200 mg (has no administration in time range)  lisinopril (ZESTRIL) tablet 20 mg (has no administration in time range)  pregabalin (LYRICA) capsule 100 mg (has no administration in time range)  HYDROmorphone (DILAUDID) injection 0.5 mg (0.5 mg Intravenous Given 02/23/22 1148)  iohexol (OMNIPAQUE) 350 MG/ML injection 150 mL (75 mLs Intravenous Contrast Given 02/23/22 1224)  sodium chloride 0.9 % bolus 500 mL (500 mLs Intravenous New Bag/Given 02/23/22 1302)     IMPRESSION / MDM / ASSESSMENT AND PLAN / ED COURSE  I reviewed the triage vital signs and the nursing notes.   Patient's presentation is most consistent with acute presentation with potential threat to life or bodily function.   Patient comes in with concern for acute limb ischemia over the past 48 hours.  Discussed with vascular Brabham who recommends CT imaging.  Patient denies any chest pain but given he does report shortness of breath will get CT PE given recent hospitalization to make sure no pulmonary embolism.  We will hold off on heparin to ensure there is no evidence of severe anemia  (given needed blood transfusion when in hospital) and to ensure there is no dissection of the aorta given recent instrumentation.  Patient was given 1.5 of Dilaudid to help with pain  I have called CT and asked patient to go stat for CT imaging does not have to wait for creatinine.  CBC shows hemoglobin that is stable from 8  days ago.  White count is elevated which could just be reactive we will continue to monitor.  Otherwise no signs for active infection.  CMP reassuring.  Lactate slightly elevated we will give a little fluid.  COVID-negative.  CT imaging just got done and I have called over to radiology to try to get a stat read  CT imaging shows possible right groin  which could be surgical serial versus active bleed but hemoglobin was stable he does have a right fluid collection that could be hematoma.  He does have two-vessel runoff so evidence of acute ischemia.  So the possibility of pseudoaneurysm.  Discussed the case with vascular surgery  Patient  does have palpable PT. vascular is now at bedside who is going to admit the patient.  The patient is on the cardiac monitor to evaluate for evidence of arrhythmia and/or significant heart rate changes.      FINAL CLINICAL IMPRESSION(S) / ED DIAGNOSES   Final diagnoses:  Leg hematoma, right, initial encounter     Rx / DC Orders   ED Discharge Orders     None        Note:  This document was prepared using Dragon voice recognition software and may include unintentional dictation errors.   Concha Se, MD 02/23/22 1438

## 2022-02-23 NOTE — Anesthesia Preprocedure Evaluation (Signed)
Anesthesia Evaluation  Patient identified by MRN, date of birth, ID band Patient awake    Reviewed: Allergy & Precautions, NPO status , Patient's Chart, lab work & pertinent test results  Airway Mallampati: III  TM Distance: >3 FB Neck ROM: full    Dental  (+) Chipped   Pulmonary sleep apnea , former smoker,    Pulmonary exam normal        Cardiovascular hypertension, Pt. on medications + Peripheral Vascular Disease  Normal cardiovascular exam  Sjogren's disease  ECHO reviewed normal 12/22  Stress test in December 2022 showing normal myocardial perfusion with no evidence of significant new symptoms of angina or shortness of breath.  The patient also had an echocardiogram showing normal LV systolic function and no evidence of significant LV systolic dysfunction or valvular heart disease needing further intervention.   Status post aortobifem bypass with significant worsening lower extremity claudication   Focal kinking/stenosis at the junction of the left subclavian/left axillary artery at the border of the first rib   Neuro/Psych PSYCHIATRIC DISORDERS Anxiety Schizophrenia Left foot drop with decreased sensation to dorsum of foot  Neuromuscular disease    GI/Hepatic negative GI ROS, Neg liver ROS,   Endo/Other  negative endocrine ROS  Renal/GU      Musculoskeletal   Abdominal   Peds  Hematology  (+) Blood dyscrasia, anemia , thrombocytosis   Anesthesia Other Findings Possible pseudoaneurysm   06/01/2021: Aortobifemoral bypass graft (12 x 6) with bilateral femoral endarterectomy for rest pain and ulceration 06/02/2021: Right superficial femoral artery endarterectomy with CorMatrix patch angioplasty 07/24/2021: Angioplasty and stent placement of the left superficial femoral and popliteal artery 08/07/2021: Angioplasty and stent placement, right superficial femoral artery 02/13/2022: Right femoral endarterectomy with  CorMatrix patch angioplasty, thrombectomy of right limb of aortobifemoral graft   Past Medical History: No date: Anxiety No date: Atherosclerotic PVD with intermittent claudication (HCC) No date: Back pain No date: Collagen vascular disease (HCC) No date: Dyspnea No date: Dysrhythmia No date: Elevated lipids No date: Hypertension No date: Nausea No date: Seropositive rheumatoid arthritis (HCC)  Past Surgical History: 06/01/2021: AORTA - BILATERAL FEMORAL ARTERY BYPASS GRAFT; Bilateral     Comment:  Procedure: AORTA BIFEMORAL BYPASS GRAFT;  Surgeon:               Renford Dills, MD;  Location: [facility] ORS;  Service:               Vascular;  Laterality: Bilateral; 04/21/2018: COLONOSCOPY WITH PROPOFOL; N/A     Comment:  Procedure: COLONOSCOPY WITH PROPOFOL;  Surgeon: [name], MD;  Location: [facility] ENDOSCOPY;  Service:               Gastroenterology;  Laterality: N/A; 06/02/2021: ENDARTERECTOMY POPLITEAL; Right     Comment:  Procedure: Right Femoral Endarterectomy and Patching               Endoplasty;  Surgeon: Nada Libman, MD;  Location:               [facility] ORS;  Service: Vascular;  Laterality: Right; 04/21/2018: ESOPHAGOGASTRODUODENOSCOPY (EGD) WITH PROPOFOL; N/A     Comment:  Procedure: ESOPHAGOGASTRODUODENOSCOPY (EGD) WITH               PROPOFOL;  Surgeon: [name], MD;  Location: [facility]               ENDOSCOPY;  Service: Gastroenterology;  Laterality: N/A; No date: HIP PINNING; Right 05/29/2021: LOWER EXTREMITY ANGIOGRAPHY; N/A     Comment:  Procedure: LOWER EXTREMITY ANGIOGRAPHY;  Surgeon:               Renford Dills, MD;  Location: [facility] INVASIVE CV LAB;               Service: Cardiovascular;  Laterality: N/A; 07/24/2021: LOWER EXTREMITY ANGIOGRAPHY; Left     Comment:  Procedure: LOWER EXTREMITY ANGIOGRAPHY;  Surgeon:               Renford Dills, MD;  Location: [facility] INVASIVE CV LAB;               Service: Cardiovascular;  Laterality:  Left; 08/07/2021: LOWER EXTREMITY ANGIOGRAPHY; Right     Comment:  Procedure: Lower Extremity Angiography;  Surgeon:               Renford Dills, MD;  Location: [facility] INVASIVE CV LAB;               Service: Cardiovascular;  Laterality: Right; 10/22/2016: TOTAL HIP ARTHROPLASTY; Right     Comment:  Procedure: TOTAL HIP ARTHROPLASTY ANTERIOR APPROACH;                Surgeon: Kennedy Bucker, MD;  Location: [facility] ORS;  Service:              Orthopedics;  Laterality: Right;  BMI    Body Mass Index: 25.84 kg/m      Reproductive/Obstetrics negative OB ROS                            Anesthesia Physical  Anesthesia Plan  ASA: 3  Anesthesia Plan: General ETT   Post-op Pain Management: Ofirmev IV (intra-op)*, Dilaudid IV and Ketamine IV*   Induction: Intravenous, Rapid sequence and Cricoid pressure planned  PONV Risk Score and Plan: 2 and Ondansetron, Dexamethasone and Midazolam  Airway Management Planned: Oral ETT  Additional Equipment: Arterial line  Intra-op Plan:   Post-operative Plan:   Informed Consent:     Dental Advisory Given  Plan Discussed with: Anesthesiologist, CRNA and Surgeon  Anesthesia Plan Comments: (Use RUE for blood pressure due to focal kinking/stenosis at the junction of the left subclavian/left axillary artery at the border of the first rib.)       Anesthesia Quick Evaluation

## 2022-02-23 NOTE — ED Triage Notes (Signed)
Pt presents to ED via POV with with c/o of having R leg pain after post-op surgery with Dr. Tobi Bastos for a vascular surgery on 8/22. Pt also presents with SOB. Pt is A&Ox4 but does appear unwell and in severe pain at this time.   Pt states he hasn't slept due to pain and states pain started increasing as of yesterday.

## 2022-02-23 NOTE — ED Notes (Signed)
Critical Result: [name], MD aware

## 2022-02-24 ENCOUNTER — Encounter
Admission: EM | Disposition: A | Discharge status: Home / Self Care | Payer: Self-pay | Source: Home / Self Care | Attending: Surgery

## 2022-02-24 ENCOUNTER — Inpatient Hospital Stay: Payer: Medicaid Other

## 2022-02-24 LAB — COMPREHENSIVE METABOLIC PANEL
ALT: 20 U/L (ref 0–44)
AST: 37 U/L (ref 15–41)
Albumin: 3.8 g/dL (ref 3.5–5.0)
Alkaline Phosphatase: 70 U/L (ref 38–126)
Anion gap: 9 (ref 5–15)
BUN: 13 mg/dL (ref 6–20)
CO2: 23 mmol/L (ref 22–32)
Calcium: 8.7 mg/dL — ABNORMAL LOW (ref 8.9–10.3)
Chloride: 102 mmol/L (ref 98–111)
Creatinine, Ser: 0.86 mg/dL (ref 0.61–1.24)
GFR, Estimated: >60 mL/min (ref >60)
Glucose, Bld: 123 mg/dL — ABNORMAL HIGH (ref 70–99)
Potassium: 4.3 mmol/L (ref 3.5–5.1)
Sodium: 134 mmol/L — ABNORMAL LOW (ref 135–145)
Total Bilirubin: 0.9 mg/dL (ref 0.3–1.2)
Total Protein: 7.6 g/dL (ref 6.5–8.1)

## 2022-02-24 LAB — CBC
HCT: 25 % — ABNORMAL LOW (ref 39.0–52.0)
Hemoglobin: 7.7 g/dL — ABNORMAL LOW (ref 13.0–17.0)
MCH: 28.1 pg (ref 26.0–34.0)
MCHC: 30.8 g/dL (ref 30.0–36.0)
MCV: 91.2 fL (ref 80.0–100.0)
Platelets: 489 10*3/uL — ABNORMAL HIGH (ref 150–400)
RBC: 2.74 MIL/uL — ABNORMAL LOW (ref 4.22–5.81)
RDW: 16.2 % — ABNORMAL HIGH (ref 11.5–15.5)
WBC: 11.7 10*3/uL — ABNORMAL HIGH (ref 4.0–10.5)
nRBC: 1.4 % — ABNORMAL HIGH (ref 0.0–0.2)

## 2022-02-24 LAB — PROTIME-INR
INR: 1 (ref 0.8–1.2)
Prothrombin Time: 13.2 seconds (ref 11.4–15.2)

## 2022-02-24 LAB — HIV ANTIBODY (ROUTINE TESTING W REFLEX): HIV Screen 4th Generation wRfx: NONREACTIVE

## 2022-02-24 SURGERY — DEBRIDEMENT, INGUINAL REGION
Anesthesia: Choice | Laterality: Right

## 2022-02-24 MED ORDER — AMLODIPINE BESYLATE 5 MG PO TABS
5.0000 mg | ORAL_TABLET | Freq: Every day | ORAL | Status: DC
  Administered 2022-02-25 – 2022-03-01 (×5): 5 mg via ORAL
  Filled 2022-02-24 (×5): qty 1

## 2022-02-24 SURGICAL SUPPLY — 52 items
APPLIER CLIP 11 MED OPEN (CLIP)
APPLIER CLIP 9.375 SM OPEN (CLIP)
BAG DECANTER FOR FLEXI CONT (MISCELLANEOUS) ×1 IMPLANT
BLADE SURG 15 STRL LF DISP TIS (BLADE) ×1 IMPLANT
BLADE SURG 15 STRL SS (BLADE) ×1
BLADE SURG SZ11 CARB STEEL (BLADE) ×1 IMPLANT
BOOT SUTURE AID YELLOW STND (SUTURE) ×1 IMPLANT
CLIP APPLIE 11 MED OPEN (CLIP) IMPLANT
CLIP APPLIE 9.375 SM OPEN (CLIP) IMPLANT
DERMABOND ADVANCED (GAUZE/BANDAGES/DRESSINGS) ×1
DERMABOND ADVANCED .7 DNX12 (GAUZE/BANDAGES/DRESSINGS) ×1 IMPLANT
DRAPE INCISE IOBAN 66X45 STRL (DRAPES) ×1 IMPLANT
ELECT CAUTERY BLADE 6.4 (BLADE) ×1 IMPLANT
ELECT REM PT RETURN 9FT ADLT (ELECTROSURGICAL) ×1
ELECTRODE REM PT RTRN 9FT ADLT (ELECTROSURGICAL) ×1 IMPLANT
GAUZE 4X4 16PLY ~~LOC~~+RFID DBL (SPONGE) ×1 IMPLANT
GLOVE BIO SURGEON STRL SZ7 (GLOVE) ×1 IMPLANT
GOWN STRL REUS W/ TWL LRG LVL3 (GOWN DISPOSABLE) ×1 IMPLANT
GOWN STRL REUS W/ TWL XL LVL3 (GOWN DISPOSABLE) ×1 IMPLANT
GOWN STRL REUS W/TWL LRG LVL3 (GOWN DISPOSABLE) ×1
GOWN STRL REUS W/TWL XL LVL3 (GOWN DISPOSABLE) ×1
IV NS 500ML (IV SOLUTION) ×1
IV NS 500ML BAXH (IV SOLUTION) ×1 IMPLANT
KIT TURNOVER KIT A (KITS) ×1 IMPLANT
LABEL OR SOLS (LABEL) ×1 IMPLANT
LOOP RED MAXI  1X406MM (MISCELLANEOUS) ×3
LOOP VESSEL MAXI 1X406 RED (MISCELLANEOUS) ×3 IMPLANT
LOOP VESSEL MINI 0.8X406 BLUE (MISCELLANEOUS) ×1 IMPLANT
LOOPS BLUE MINI 0.8X406MM (MISCELLANEOUS) ×1
MANIFOLD NEPTUNE II (INSTRUMENTS) ×1 IMPLANT
NEEDLE HYPO 25X1 1.5 SAFETY (NEEDLE) IMPLANT
NS IRRIG 1000ML POUR BTL (IV SOLUTION) ×1 IMPLANT
PACK BASIN MAJOR ARMC (MISCELLANEOUS) ×1 IMPLANT
PACK UNIVERSAL (MISCELLANEOUS) ×1 IMPLANT
SPONGE T-LAP 18X18 ~~LOC~~+RFID (SPONGE) ×2 IMPLANT
SUT MNCRL 4-0 (SUTURE) ×1
SUT MNCRL 4-0 27XMFL (SUTURE) ×1
SUT SILK 2 0 (SUTURE) ×1
SUT SILK 2-0 18XBRD TIE 12 (SUTURE) ×1 IMPLANT
SUT SILK 3 0 (SUTURE) ×1
SUT SILK 3-0 18XBRD TIE 12 (SUTURE) ×1 IMPLANT
SUT SILK 4 0 (SUTURE) ×1
SUT SILK 4-0 18XBRD TIE 12 (SUTURE) ×1 IMPLANT
SUT VIC AB 2-0 CT1 (SUTURE) ×2 IMPLANT
SUT VICRYL+ 3-0 36IN CT-1 (SUTURE) ×2 IMPLANT
SUTURE MNCRL 4-0 27XMF (SUTURE) ×1 IMPLANT
SYR 20ML LL LF (SYRINGE) ×1 IMPLANT
SYR 5ML LL (SYRINGE) ×1 IMPLANT
SYR BULB IRRIG 60ML STRL (SYRINGE) ×1 IMPLANT
TRAP FLUID SMOKE EVACUATOR (MISCELLANEOUS) ×1 IMPLANT
TRAY FOLEY MTR SLVR 16FR STAT (SET/KITS/TRAYS/PACK) ×1 IMPLANT
WATER STERILE IRR 500ML POUR (IV SOLUTION) ×1 IMPLANT

## 2022-02-24 NOTE — Plan of Care (Signed)

## 2022-02-24 NOTE — Plan of Care (Signed)
  Problem: Activity: Goal: Ability to return to baseline activity level will improve Outcome: Progressing   Problem: Cardiovascular: Goal: Vascular access site(s) Level 0-1 will be maintained Outcome: Progressing   

## 2022-02-24 NOTE — Progress Notes (Signed)
Ultrasound from this morning was reviewed with the sonographer.  The common femoral artery was visualized well and there was no jet of dye coming from the anastomosis.  There was no pseudoaneurysm visualized.  For that reason, I have canceled his surgery today.  He has not had any change in the size of his groin.  His hemoglobin remained stable.  After speaking with him, I will monitor him for 1 more night to see if this changes.  I will consider repeating a CT scan to see if there has been any interval change.  Durene Cal

## 2022-02-24 NOTE — Progress Notes (Signed)
    Subjective  -   No acute events Right leg fels a little better  Physical Exam:  Right groin feels softer, no expansion     Assessment/Plan:    Plan for dupelx u/s this am to eval for pseudoaneurysm.  Remains on OR schedule   Wells Emauri Krygier 02/24/2022 8:05 AM --  Vitals:   02/24/22 0327 02/24/22 0745  BP: 94/76 123/81  Pulse: 99 95  Resp: 16 16  Temp: 98 F (36.7 C) 98.5 F (36.9 C)  SpO2: 100% 100%    Intake/Output Summary (Last 24 hours) at 02/24/2022 0805 Last data filed at 02/23/2022 2300 Gross per 24 hour  Intake 200 ml  Output --  Net 200 ml     Laboratory CBC    Component Value Date/Time   WBC 11.7 (H) 02/24/2022 0344   HGB 7.7 (L) 02/24/2022 0344   HGB 14.4 03/30/2018 1404   HCT 25.0 (L) 02/24/2022 0344   HCT 42.9 03/30/2018 1404   PLT 489 (H) 02/24/2022 0344   PLT 321 03/30/2018 1404    BMET    Component Value Date/Time   NA 134 (L) 02/24/2022 0344   NA 140 03/30/2018 1404   K 4.3 02/24/2022 0344   CL 102 02/24/2022 0344   CO2 23 02/24/2022 0344   GLUCOSE 123 (H) 02/24/2022 0344   BUN 13 02/24/2022 0344   BUN 9 03/30/2018 1404   CREATININE 0.86 02/24/2022 0344   CALCIUM 8.7 (L) 02/24/2022 0344   GFRNONAA >60 02/24/2022 0344   GFRAA 129 03/30/2018 1404    COAG Lab Results  Component Value Date   INR 1.0 02/24/2022   INR 1.0 02/23/2022   INR 0.9 02/12/2022   No results found for: "PTT"  Antibiotics Anti-infectives (From admission, onward)    Start     Dose/Rate Route Frequency Ordered Stop   02/23/22 2200  hydroxychloroquine (PLAQUENIL) tablet 200 mg        200 mg Oral 2 times daily 02/23/22 1402          V. Charlena Cross, M.D., [facility] [place] Office: [phone] Pager:  [phone]

## 2022-02-25 ENCOUNTER — Inpatient Hospital Stay: Payer: Medicaid Other

## 2022-02-25 ENCOUNTER — Encounter: Payer: Self-pay | Admitting: Surgery

## 2022-02-25 MED ORDER — IOHEXOL 350 MG/ML SOLN
100.0000 mL | Freq: Once | INTRAVENOUS | Status: AC | PRN
Start: 2022-02-25 — End: 2022-02-25
  Administered 2022-02-25: 100 mL via INTRAVENOUS

## 2022-02-25 MED ORDER — METOPROLOL TARTRATE 5 MG/5ML IV SOLN
10.0000 mg | Freq: Once | INTRAVENOUS | Status: AC
Start: 2022-02-25 — End: 2022-02-25
  Administered 2022-02-25: 10 mg via INTRAVENOUS
  Filled 2022-02-25: qty 10

## 2022-02-25 NOTE — Progress Notes (Signed)
Call received from CCMD. Patient HR increased to 140s. Dr. Myra Gianotti made aware. No distress noted. One time order for Metoprolol 10 mg IV given.

## 2022-02-25 NOTE — TOC CM/SW Note (Signed)
  Transition of Care [facility]) Screening Note   Patient Details  Name: Jeffrey Grant. Date of Birth: 04-Oct-1980   Transition of Care [facility]) CM/SW Contact:    Margarito Liner, LCSW Phone Number: 02/25/2022, 12:26 PM    Transition of Care Department [facility]) has reviewed patient and no TOC needs have been identified at this time. We will continue to monitor patient advancement through interdisciplinary progression rounds. If new patient transition needs arise, please place a TOC consult.

## 2022-02-25 NOTE — Progress Notes (Signed)
    Subjective  -   Says his right leg feels a little softer and less tender   Physical Exam:  Right groin incision remains intact without drainage No cellulitis Area is softer to touch       Assessment/Plan:    CT scan was repeated today.  There still appears to be extraluminal contrast.  I discussed this with the patient.  I feel the neck step is to proceed with angiography to better define this and to see if this can be treated with stenting or possibly thrombin injection if it is indeed a pseudoaneurysm.  He is in agreement.  He will be n.p.o. after midnight  Jeffrey Grant 02/25/2022 5:26 PM --  Vitals:   02/25/22 0845 02/25/22 1600  BP: (!) 124/90 (!) 148/92  Pulse: 96 (!) 112  Resp:  18  Temp: 97.7 F (36.5 C) 97.9 F (36.6 C)  SpO2: 100% 100%    Intake/Output Summary (Last 24 hours) at 02/25/2022 1726 Last data filed at 02/25/2022 1530 Gross per 24 hour  Intake 600 ml  Output 850 ml  Net -250 ml     Laboratory CBC    Component Value Date/Time   WBC 11.7 (H) 02/24/2022 0344   HGB 7.7 (L) 02/24/2022 0344   HGB 14.4 03/30/2018 1404   HCT 25.0 (L) 02/24/2022 0344   HCT 42.9 03/30/2018 1404   PLT 489 (H) 02/24/2022 0344   PLT 321 03/30/2018 1404    BMET    Component Value Date/Time   NA 134 (L) 02/24/2022 0344   NA 140 03/30/2018 1404   K 4.3 02/24/2022 0344   CL 102 02/24/2022 0344   CO2 23 02/24/2022 0344   GLUCOSE 123 (H) 02/24/2022 0344   BUN 13 02/24/2022 0344   BUN 9 03/30/2018 1404   CREATININE 0.86 02/24/2022 0344   CALCIUM 8.7 (L) 02/24/2022 0344   GFRNONAA >60 02/24/2022 0344   GFRAA 129 03/30/2018 1404    COAG Lab Results  Component Value Date   INR 1.0 02/24/2022   INR 1.0 02/23/2022   INR 0.9 02/12/2022   No results found for: "PTT"  Antibiotics Anti-infectives (From admission, onward)    Start     Dose/Rate Route Frequency Ordered Stop   02/23/22 2200  hydroxychloroquine (PLAQUENIL) tablet 200 mg        200 mg Oral 2  times daily 02/23/22 1402          V. Charlena Cross, M.D., [facility] [place] Office: [phone] Pager:  [phone]

## 2022-02-25 NOTE — H&P (View-Only) (Signed)
    Subjective  -   Says his right leg feels a little softer and less tender   Physical Exam:  Right groin incision remains intact without drainage No cellulitis Area is softer to touch       Assessment/Plan:    CT scan was repeated today.  There still appears to be extraluminal contrast.  I discussed this with the patient.  I feel the neck step is to proceed with angiography to better define this and to see if this can be treated with stenting or possibly thrombin injection if it is indeed a pseudoaneurysm.  He is in agreement.  He will be n.p.o. after midnight  Jeffrey Grant 02/25/2022 5:26 PM --  Vitals:   02/25/22 0845 02/25/22 1600  BP: (!) 124/90 (!) 148/92  Pulse: 96 (!) 112  Resp:  18  Temp: 97.7 F (36.5 C) 97.9 F (36.6 C)  SpO2: 100% 100%    Intake/Output Summary (Last 24 hours) at 02/25/2022 1726 Last data filed at 02/25/2022 1530 Gross per 24 hour  Intake 600 ml  Output 850 ml  Net -250 ml     Laboratory CBC    Component Value Date/Time   WBC 11.7 (H) 02/24/2022 0344   HGB 7.7 (L) 02/24/2022 0344   HGB 14.4 03/30/2018 1404   HCT 25.0 (L) 02/24/2022 0344   HCT 42.9 03/30/2018 1404   PLT 489 (H) 02/24/2022 0344   PLT 321 03/30/2018 1404    BMET    Component Value Date/Time   NA 134 (L) 02/24/2022 0344   NA 140 03/30/2018 1404   K 4.3 02/24/2022 0344   CL 102 02/24/2022 0344   CO2 23 02/24/2022 0344   GLUCOSE 123 (H) 02/24/2022 0344   BUN 13 02/24/2022 0344   BUN 9 03/30/2018 1404   CREATININE 0.86 02/24/2022 0344   CALCIUM 8.7 (L) 02/24/2022 0344   GFRNONAA >60 02/24/2022 0344   GFRAA 129 03/30/2018 1404    COAG Lab Results  Component Value Date   INR 1.0 02/24/2022   INR 1.0 02/23/2022   INR 0.9 02/12/2022   No results found for: "PTT"  Antibiotics Anti-infectives (From admission, onward)    Start     Dose/Rate Route Frequency Ordered Stop   02/23/22 2200  hydroxychloroquine (PLAQUENIL) tablet 200 mg        200 mg Oral 2  times daily 02/23/22 1402          V. Jeffrey Vaneta Hammontree IV, M.D., FACS [facility] [place] Office: [phone] Pager:  [phone]  

## 2022-02-26 ENCOUNTER — Encounter: Payer: Self-pay | Admitting: Vascular Surgery

## 2022-02-26 ENCOUNTER — Encounter
Admission: EM | Disposition: A | Discharge status: Home / Self Care | Payer: Self-pay | Source: Home / Self Care | Attending: Surgery

## 2022-02-26 DIAGNOSIS — T82898A Other specified complication of vascular prosthetic devices, implants and grafts, initial encounter: Secondary | ICD-10-CM

## 2022-02-26 DIAGNOSIS — Z9889 Other specified postprocedural states: Secondary | ICD-10-CM | POA: Diagnosis not present

## 2022-02-26 DIAGNOSIS — I743 Embolism and thrombosis of arteries of the lower extremities: Secondary | ICD-10-CM

## 2022-02-26 HISTORY — PX: ABDOMINAL AORTOGRAM W/LOWER EXTREMITY: CATH118223

## 2022-02-26 SURGERY — ABDOMINAL AORTOGRAM W/LOWER EXTREMITY
Anesthesia: IV Sedation (MBSC Only)

## 2022-02-26 MED ORDER — DEXTROSE 5 % IV SOLN: 2000.0000 mg | Freq: Once | INTRAVENOUS | Status: DC

## 2022-02-26 MED ORDER — DIPHENHYDRAMINE HCL 50 MG/ML IJ SOLN
INTRAMUSCULAR | Status: AC
  Filled 2022-02-26: qty 1

## 2022-02-26 MED ORDER — IODIXANOL 320 MG/ML IV SOLN
INTRAVENOUS | Status: DC | PRN
  Administered 2022-02-26: 30 mL

## 2022-02-26 MED ORDER — SODIUM CHLORIDE 0.9% FLUSH
3.0000 mL | INTRAVENOUS | Status: DC | PRN
Start: 2022-02-26 — End: 2022-03-01

## 2022-02-26 MED ORDER — MIDAZOLAM HCL 2 MG/2ML IJ SOLN
INTRAMUSCULAR | Status: DC | PRN
  Administered 2022-02-26: 2 mg via INTRAVENOUS
  Administered 2022-02-26 (×2): 1 mg via INTRAVENOUS

## 2022-02-26 MED ORDER — HEPARIN SODIUM (PORCINE) 1000 UNIT/ML IJ SOLN
INTRAMUSCULAR | Status: DC | PRN
  Administered 2022-02-26: 4000 [IU] via INTRAVENOUS

## 2022-02-26 MED ORDER — FENTANYL CITRATE PF 50 MCG/ML IJ SOSY
PREFILLED_SYRINGE | INTRAMUSCULAR | Status: AC
  Filled 2022-02-26: qty 2

## 2022-02-26 MED ORDER — SODIUM CHLORIDE 0.9 % IV SOLN: INTRAVENOUS | Status: AC

## 2022-02-26 MED ORDER — ACETAMINOPHEN 325 MG PO TABS: 650.0000 mg | ORAL_TABLET | ORAL | Status: DC | PRN

## 2022-02-26 MED ORDER — ORAL CARE MOUTH RINSE: 15.0000 mL | OROMUCOSAL | Status: DC | PRN

## 2022-02-26 MED ORDER — FENTANYL CITRATE (PF) 100 MCG/2ML IJ SOLN
INTRAMUSCULAR | Status: DC | PRN
Start: 2022-02-26 — End: 2022-02-26
  Administered 2022-02-26 (×2): 50 ug via INTRAVENOUS

## 2022-02-26 MED ORDER — MIDAZOLAM HCL 2 MG/2ML IJ SOLN
INTRAMUSCULAR | Status: AC
  Filled 2022-02-26: qty 2

## 2022-02-26 MED ORDER — ONDANSETRON HCL 4 MG/2ML IJ SOLN
4.0000 mg | Freq: Four times a day (QID) | INTRAMUSCULAR | Status: DC | PRN
  Administered 2022-02-28: 4 mg via INTRAVENOUS
  Filled 2022-02-26: qty 2

## 2022-02-26 MED ORDER — SODIUM CHLORIDE 0.9 % IV SOLN
250.0000 mL | INTRAVENOUS | Status: DC | PRN
Start: 2022-02-26 — End: 2022-03-01

## 2022-02-26 MED ORDER — MORPHINE SULFATE (PF) 4 MG/ML IV SOLN
2.0000 mg | INTRAVENOUS | Status: DC | PRN
  Administered 2022-02-26: 2 mg via INTRAVENOUS

## 2022-02-26 MED ORDER — SODIUM CHLORIDE 0.9% FLUSH
3.0000 mL | Freq: Two times a day (BID) | INTRAVENOUS | Status: DC
  Administered 2022-02-26 – 2022-02-28 (×6): 3 mL via INTRAVENOUS

## 2022-02-26 MED ORDER — LABETALOL HCL 5 MG/ML IV SOLN: 10.0000 mg | INTRAVENOUS | Status: DC | PRN

## 2022-02-26 MED ORDER — SODIUM CHLORIDE 0.9 % IV SOLN: INTRAVENOUS | Status: DC

## 2022-02-26 MED ORDER — FENTANYL CITRATE PF 50 MCG/ML IJ SOSY
PREFILLED_SYRINGE | INTRAMUSCULAR | Status: AC
  Filled 2022-02-26: qty 1

## 2022-02-26 MED ORDER — CEFAZOLIN SODIUM-DEXTROSE 2-4 GM/100ML-% IV SOLN
INTRAVENOUS | Status: AC
  Administered 2022-02-26: 2 g via INTRAVENOUS
  Filled 2022-02-26: qty 100

## 2022-02-26 MED ORDER — MORPHINE SULFATE (PF) 2 MG/ML IV SOLN
INTRAVENOUS | Status: AC
  Administered 2022-02-26: 2 mg via INTRAVENOUS
  Filled 2022-02-26: qty 1

## 2022-02-26 MED ORDER — CEFAZOLIN SODIUM-DEXTROSE 2-4 GM/100ML-% IV SOLN: 2.0000 g | Freq: Once | INTRAVENOUS | Status: AC

## 2022-02-26 MED ORDER — DIPHENHYDRAMINE HCL 50 MG/ML IJ SOLN
INTRAMUSCULAR | Status: DC | PRN
  Administered 2022-02-26: 25 mg via INTRAVENOUS

## 2022-02-26 MED ORDER — HYDRALAZINE HCL 20 MG/ML IJ SOLN: 5.0000 mg | INTRAMUSCULAR | Status: DC | PRN

## 2022-02-26 MED ORDER — OXYCODONE HCL 5 MG PO TABS
5.0000 mg | ORAL_TABLET | ORAL | Status: DC | PRN
  Administered 2022-02-26 – 2022-03-01 (×6): 10 mg via ORAL
  Filled 2022-02-26 (×6): qty 2

## 2022-02-26 MED ORDER — HEPARIN SODIUM (PORCINE) 1000 UNIT/ML IJ SOLN
INTRAMUSCULAR | Status: AC
  Filled 2022-02-26: qty 10

## 2022-02-26 MED ORDER — MIDAZOLAM HCL 2 MG/2ML IJ SOLN
INTRAMUSCULAR | Status: AC
  Filled 2022-02-26: qty 4

## 2022-02-26 SURGICAL SUPPLY — 19 items
CATH MICROCATH PRGRT 2.8F 110 (CATHETERS) IMPLANT
CATH TEMPO 5F RIM 65CM (CATHETERS) IMPLANT
CATH VS15FR (CATHETERS) IMPLANT
COIL 400 COMPLEX SOFT 10X35CM (Vascular Products) IMPLANT
COIL 400 COMPLEX SOFT 16X50CM (Vascular Products) IMPLANT
COVER PROBE ULTRASOUND 5X96 (MISCELLANEOUS) IMPLANT
DEVICE STARCLOSE SE CLOSURE (Vascular Products) IMPLANT
GLIDEWIRE ADV .035X260CM (WIRE) IMPLANT
GUIDEWIRE AMPLATZ SHORT (WIRE) IMPLANT
GUIDEWIRE ANGLED .035 180CM (WIRE) IMPLANT
HANDLE DETACHMENT COIL (MISCELLANEOUS) IMPLANT
KIT MICROPUNCTURE NIT STIFF (SHEATH) IMPLANT
MICROCATH PROGREAT 2.8F 110 CM (CATHETERS) ×1
NDL ENTRY 21GA 7CM ECHOTIP (NEEDLE) IMPLANT
NEEDLE ENTRY 21GA 7CM ECHOTIP (NEEDLE) ×1 IMPLANT
PACK ANGIOGRAPHY (CUSTOM PROCEDURE TRAY) IMPLANT
SET INTRO CAPELLA COAXIAL (SET/KITS/TRAYS/PACK) IMPLANT
SHEATH BRITE TIP 5FRX11 (SHEATH) IMPLANT
WIRE GUIDERIGHT .035X150 (WIRE) IMPLANT

## 2022-02-26 NOTE — Op Note (Signed)
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   Post-operative diagnosis:  Same   Procedure(s) Performed:             1.  Ultrasound guidance for vascular access left femoral artery             2.  Catheter placement into right common femoral artery             3.  Selective angiogram of the right common femoral artery             4.  Coil embolization of the pseudoaneurysm             5.  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 Coil embolization is seen as a minimally invasive technique for treatment given his multiple previous groin surgeries as well as the recent nature of his current groin.  This would make reoperation extraordinarily difficult. The patient is brought in for angiography for further evaluation and potential treatment. Risks and benefits are discussed and informed consent is obtained   Procedure:  The patient was identified and appropriate procedural time out was performed.  The patient was then placed supine on the table and prepped and draped in the usual sterile fashion. Moderate conscious sedation was administered during a face to face encounter with the patient throughout the procedure with my supervision of the RN administering medicines and monitoring the patient's vital signs, pulse oximetry, telemetry and mental status throughout from the  start of the procedure until the patient was taken to the recovery room.  Ultrasound was used to evaluate the left common femoral artery.  It was patent .  A digital ultrasound image was acquired.  A micro needle was used to access the left common femoral artery under direct ultrasound guidance and a permanent image was performed.  A 0.035 J wire was advanced without resistance and a 5Fr sheath was placed.  VS 1 catheter was placed into the aorta and an AP aortogram was performed to localize the bifurcation of the graft.   The patient was given 4000 units of intravenous heparin during the procedure.    I then crossed the aortic bifurcation with the V S1 catheter.  From the proximal injection within the limb of the aortobifemoral [place] was identified.  I then performed a steep RAO nearly lateral projection with magnification and identified the possible neck of the pseudoaneurysm which did appear to be very narrow.  Through the V S1 catheter I advanced a prograde catheter and was able to cross into the pseudoaneurysm with the progreat wire and then catheter.  This was confirmed by hand-injection where measurements were made pseudoaneurysms measured 20 mm x 17 mm.  Initially I tried to put a 14 pod in but this was too stiff and the catheter was pushed out.  I reengaged the pseudoaneurysm and then placed a 16 mm x 50 cm soft coil followed by a 10 mm x 35 cm soft coil.  At this point the pseudoaneurysm sac was densely packed.  I pulled the prograde catheter proximally into the limb repeat angiography demonstrated that there was no longer any filling of the pseudoaneurysm.  Pseudoaneurysm appears to been successfully treated.   I elected to terminate the procedure. The diagnostic catheter was removed. StarClose closure device was deployed in usual fashion with excellent hemostatic result. The patient was taken to the recovery room in stable condition having tolerated the procedure well.      Findings:   Successful occlusion of the right common femoral pseudoaneurysm.   Disposition: Patient was taken to the recovery room in stable condition having tolerated the procedure well.   Complications:  None   [name], MD 02/26/2022 11:44 AM     This note was created with Dragon Medical transcription system. Any errors in dictation are purely unintentional.

## 2022-02-26 NOTE — Interval H&P Note (Signed)
History and Physical Interval Note:  02/26/2022 10:33 AM  Jeffrey Grant.  has presented today for surgery, with the diagnosis of Possible right femoral pseudoaneurysm.  The various methods of treatment have been discussed with the patient and family. After consideration of risks, benefits and other options for treatment, the patient has consented to  Procedure(s): ABDOMINAL AORTOGRAM W/LOWER EXTREMITY (N/A) as a surgical intervention.  The patient's history has been reviewed, patient examined, no change in status, stable for surgery.  I have reviewed the patient's chart and labs.  Questions were answered to the patient's satisfaction.     [name]

## 2022-02-26 NOTE — Plan of Care (Signed)

## 2022-02-26 NOTE — Progress Notes (Signed)
Patient off of unit for procedure.  

## 2022-02-27 LAB — CBC
HCT: 28.2 % — ABNORMAL LOW (ref 39.0–52.0)
Hemoglobin: 9 g/dL — ABNORMAL LOW (ref 13.0–17.0)
MCH: 28.7 pg (ref 26.0–34.0)
MCHC: 31.9 g/dL (ref 30.0–36.0)
MCV: 89.8 fL (ref 80.0–100.0)
Platelets: 477 10*3/uL — ABNORMAL HIGH (ref 150–400)
RBC: 3.14 MIL/uL — ABNORMAL LOW (ref 4.22–5.81)
RDW: 16.2 % — ABNORMAL HIGH (ref 11.5–15.5)
WBC: 12.7 10*3/uL — ABNORMAL HIGH (ref 4.0–10.5)
nRBC: 0.5 % — ABNORMAL HIGH (ref 0.0–0.2)

## 2022-02-27 NOTE — Progress Notes (Signed)
[facility]  Daily Progress Note   Subjective  -   Patient is getting up and ambulating well.  Anxious to go home.  However continues to have some tachycardia with ambulation.  Objective Vitals:   02/27/22 0824 02/27/22 1641 02/27/22 1943 02/27/22 2147  BP: (!) 141/82 (!) 127/90 122/86 (!) 129/92  Pulse: 97 (!) 110 (!) 105 (!) 101  Resp: 14 16 20 20   Temp: 98.6 F (37 C) 98 F (36.7 C) 99 F (37.2 C) 98.6 F (37 C)  TempSrc: Oral Oral Oral Oral  SpO2: 100% 100% 100% 100%  Weight:      Height:        Intake/Output Summary (Last 24 hours) at 02/27/2022 2350 Last data filed at 02/27/2022 1000 Gross per 24 hour  Intake 3 ml  Output --  Net 3 ml    PULM  CTAB CV  RRR VASC  bilateral feet warm  Laboratory CBC    Component Value Date/Time   WBC 12.7 (H) 02/27/2022 1302   HGB 9.0 (L) 02/27/2022 1302   HGB 14.4 03/30/2018 1404   HCT 28.2 (L) 02/27/2022 1302   HCT 42.9 03/30/2018 1404   PLT 477 (H) 02/27/2022 1302   PLT 321 03/30/2018 1404    BMET    Component Value Date/Time   NA 134 (L) 02/24/2022 0344   NA 140 03/30/2018 1404   K 4.3 02/24/2022 0344   CL 102 02/24/2022 0344   CO2 23 02/24/2022 0344   GLUCOSE 123 (H) 02/24/2022 0344   BUN 13 02/24/2022 0344   BUN 9 03/30/2018 1404   CREATININE 0.86 02/24/2022 0344   CALCIUM 8.7 (L) 02/24/2022 0344   GFRNONAA >60 02/24/2022 0344   GFRAA 129 03/30/2018 1404    Assessment/Planning: POD #1 s/p coil embolization of pseudoaneurysm  Patient is still anemic but hemoglobin is stable at 9 today We will watch white blood cell count slightly elevated. Possible DC tomorrow  05/30/2018  02/27/2022, 11:50 PM

## 2022-02-27 NOTE — Plan of Care (Signed)

## 2022-02-28 ENCOUNTER — Inpatient Hospital Stay: Payer: Medicaid Other

## 2022-02-28 ENCOUNTER — Ambulatory Visit (INDEPENDENT_AMBULATORY_CARE_PROVIDER_SITE_OTHER): Payer: Medicaid Other | Admitting: Vascular Surgery

## 2022-02-28 ENCOUNTER — Other Ambulatory Visit (INDEPENDENT_AMBULATORY_CARE_PROVIDER_SITE_OTHER): Payer: Self-pay | Admitting: Nurse Practitioner

## 2022-02-28 LAB — CBC
HCT: 26.6 % — ABNORMAL LOW (ref 39.0–52.0)
Hemoglobin: 8.4 g/dL — ABNORMAL LOW (ref 13.0–17.0)
MCH: 28.3 pg (ref 26.0–34.0)
MCHC: 31.6 g/dL (ref 30.0–36.0)
MCV: 89.6 fL (ref 80.0–100.0)
Platelets: 443 10*3/uL — ABNORMAL HIGH (ref 150–400)
RBC: 2.97 MIL/uL — ABNORMAL LOW (ref 4.22–5.81)
RDW: 16 % — ABNORMAL HIGH (ref 11.5–15.5)
WBC: 10.6 10*3/uL — ABNORMAL HIGH (ref 4.0–10.5)
nRBC: 0.6 % — ABNORMAL HIGH (ref 0.0–0.2)

## 2022-02-28 MED ORDER — CLOPIDOGREL BISULFATE 75 MG PO TABS
75.0000 mg | ORAL_TABLET | Freq: Every day | ORAL | Status: DC
Start: 2022-02-28 — End: 2022-03-01
  Administered 2022-02-28 – 2022-03-01 (×2): 75 mg via ORAL
  Filled 2022-02-28 (×2): qty 1

## 2022-02-28 NOTE — Plan of Care (Signed)
  Problem: Health Behavior/Discharge Planning: Goal: Ability to manage health-related needs will improve Outcome: Progressing   Problem: Clinical Measurements: Goal: Will remain free from infection Outcome: Progressing   Problem: Clinical Measurements: Goal: Respiratory complications will improve Outcome: Progressing   Problem: Clinical Measurements: Goal: Cardiovascular complication will be avoided Outcome: Progressing   Problem: Activity: Goal: Risk for activity intolerance will decrease Outcome: Progressing   Problem: Pain Managment: Goal: General experience of comfort will improve Outcome: Progressing

## 2022-02-28 NOTE — Progress Notes (Signed)
Mobility Specialist - Progress Note  Pre-mobility: SpO2 99% During mobility: SpO2 96% Post-mobility: SPO2 100%   02/28/22 1537  Mobility  Activity Ambulated independently in room  Level of Assistance Modified independent, requires aide device or extra time  Assistive Device None  Distance Ambulated (ft) 8 ft  Activity Response Tolerated well  $Mobility charge 1 Mobility   Pt supine upon entry, utilizing RA. Pt completed bed mob indep. Pt voiced concerns of UE soreness (shoulders). Pt ambulated to door in room without AD, extra time needed. Pt left supine with needs within reach. No complaints.   Zetta Bills Mobility Specialist 02/28/22 3:41 PM

## 2022-03-01 LAB — CBC
HCT: 27.4 % — ABNORMAL LOW (ref 39.0–52.0)
Hemoglobin: 8.7 g/dL — ABNORMAL LOW (ref 13.0–17.0)
MCH: 28.8 pg (ref 26.0–34.0)
MCHC: 31.8 g/dL (ref 30.0–36.0)
MCV: 90.7 fL (ref 80.0–100.0)
Platelets: 424 10*3/uL — ABNORMAL HIGH (ref 150–400)
RBC: 3.02 MIL/uL — ABNORMAL LOW (ref 4.22–5.81)
RDW: 16.2 % — ABNORMAL HIGH (ref 11.5–15.5)
WBC: 11.4 10*3/uL — ABNORMAL HIGH (ref 4.0–10.5)
nRBC: 0.8 % — ABNORMAL HIGH (ref 0.0–0.2)

## 2022-03-01 MED ORDER — DOXYCYCLINE HYCLATE 50 MG PO CAPS: 50.0000 mg | ORAL_CAPSULE | Freq: Two times a day (BID) | ORAL | 0 refills | Status: DC

## 2022-03-01 MED ORDER — FERROUS SULFATE 325 (65 FE) MG PO TABS: 325.0000 mg | ORAL_TABLET | Freq: Two times a day (BID) | ORAL | 3 refills | Status: AC

## 2022-03-01 NOTE — Progress Notes (Signed)
Discharge instructions reviewed with patient at bedside. Questions and concerns were addressed. PIV removed with no complications. Patient Dcing with wife

## 2022-03-01 NOTE — Progress Notes (Signed)
[facility]  Daily Progress Note   Subjective  -   Had some nausea and vomiting this morning but improved in the afternoon  Objective Vitals:   02/28/22 0353 02/28/22 0746 02/28/22 1513 02/28/22 1941  BP: (!) 135/97 (!) 127/91 119/83 122/85  Pulse: 100 (!) 108 (!) 43 (!) 103  Resp: 20 16 16 20   Temp: (!) 97.5 F (36.4 C) 98.3 F (36.8 C) 98.2 F (36.8 C) 99.1 F (37.3 C)  TempSrc: Oral Oral Oral Oral  SpO2: 100% 100% (!) 87% 100%  Weight:      Height:        Intake/Output Summary (Last 24 hours) at 03/01/2022 0021 Last data filed at 02/28/2022 2200 Gross per 24 hour  Intake 240 ml  Output 0 ml  Net 240 ml    PULM  CTAB CV  RRR VASC  warm right foot 1+ pulses  Laboratory CBC    Component Value Date/Time   WBC 10.6 (H) 02/28/2022 0410   HGB 8.4 (L) 02/28/2022 0410   HGB 14.4 03/30/2018 1404   HCT 26.6 (L) 02/28/2022 0410   HCT 42.9 03/30/2018 1404   PLT 443 (H) 02/28/2022 0410   PLT 321 03/30/2018 1404    BMET    Component Value Date/Time   NA 134 (L) 02/24/2022 0344   NA 140 03/30/2018 1404   K 4.3 02/24/2022 0344   CL 102 02/24/2022 0344   CO2 23 02/24/2022 0344   GLUCOSE 123 (H) 02/24/2022 0344   BUN 13 02/24/2022 0344   BUN 9 03/30/2018 1404   CREATININE 0.86 02/24/2022 0344   CALCIUM 8.7 (L) 02/24/2022 0344   GFRNONAA >60 02/24/2022 0344   GFRAA 129 03/30/2018 1404    Assessment/Planning: POD #2 s/p coil embolization  Patient had some nausea and vomiting in felt feverish this morning although no confirmed temperature.  Chest x-ray negative, blood cultures drawn and urinalysis ordered.  Patient notes that he feels better in the afternoon following eating Patient had a concerned about a nodule on his that it may be a blood clot, given recent vomiting this is likely just some extra muscles no cause for concern If no continued episodes of vomiting or issues with patient will likely discharge tomorrow  05/30/2018  03/01/2022, 12:21 AM

## 2022-03-01 NOTE — Discharge Summary (Signed)
[facility]    Discharge Summary    Patient ID:  Jeffrey Grant. MRN: [id number] DOB/AGE: 1981-01-16 41 y.o.  Admit date: 02/23/2022 Discharge date: 03/01/2022 Date of Surgery: 02/26/2022 Surgeon: Surgeon(s): Schnier, Latina Craver, MD  Admission Diagnosis: PAD (peripheral artery disease) (HCC) [I73.9] Leg hematoma, right, initial encounter [S80.11XA]  Discharge Diagnoses:  PAD (peripheral artery disease) (HCC) [I73.9] Leg hematoma, right, initial encounter [S80.11XA]  Secondary Diagnoses: Past Medical History:  Diagnosis Date   Anxiety    Atherosclerotic PVD with intermittent claudication (HCC)    Back pain    Collagen vascular disease (HCC)    Dyspnea    Dysrhythmia    Elevated lipids    Hypertension    Nausea    Seropositive rheumatoid arthritis (HCC)     Procedure(s): ABDOMINAL AORTOGRAM W/LOWER EXTREMITY  Discharged Condition: good  HPI:  Jeffrey Grant is a 41 year old male who presented [facility] on [facility]/23/2022 given right groin and thigh pain.  He has underwent a series of interventions including:  06/01/2021: Aortobifemoral bypass graft (12 x 6) with bilateral femoral endarterectomy for rest pain and ulceration 06/02/2021: Right superficial femoral artery endarterectomy with CorMatrix patch angioplasty 07/24/2021: Angioplasty and stent placement of the left superficial femoral and popliteal artery 08/07/2021: Angioplasty and stent placement, right superficial femoral artery 02/13/2022: Right femoral endarterectomy with CorMatrix patch angioplasty, thrombectomy of right limb of aortobifemoral graft  He presented to the emergency room following his right thigh becoming harder and more tender.  He also had worsening shortness of breath.  The patient was found to have a pseudoaneurysm.  It was treated with coil embolization.  Following treatment the patient notes that the area was feeling better.  He was able to ambulate  without issue.  The patient developed vomiting with malaise on yesterday but that had resolved by lunch.  No evidence of infection from chest x-ray or blood cultures which has not shown any growth.  Patient still continues to have some anemia following his surgery several weeks ago.  Stressed importance of iron-containing foods as well as staying adequately hydrated.  Patient will also be sent home with iron tablets.  We will plan on having her return to the office in approximately 1 week for his staple removal.  Hospital Course:  Jeffrey Grant. is a 41 y.o. male is S/P Right coil embolization of pseudoaneurysm Procedure(s): ABDOMINAL AORTOGRAM W/LOWER EXTREMITY Extubated: POD # 0 Physical exam: Right foot is warm with a 1+ PT pulse.  Incision is clean dry and intact Post-op wounds clean, dry, intact or healing well Pt. Ambulating, voiding and taking PO diet without difficulty. Pt pain controlled with PO pain meds. Labs as below Complications:none  Consults:  Treatment Team:  Annice Needy, MD  Significant Diagnostic Studies: CBC Lab Results  Component Value Date   WBC 11.4 (H) 03/01/2022   HGB 8.7 (L) 03/01/2022   HCT 27.4 (L) 03/01/2022   MCV 90.7 03/01/2022   PLT 424 (H) 03/01/2022    BMET    Component Value Date/Time   NA 134 (L) 02/24/2022 0344   NA 140 03/30/2018 1404   K 4.3 02/24/2022 0344   CL 102 02/24/2022 0344   CO2 23 02/24/2022 0344   GLUCOSE 123 (H) 02/24/2022 0344   BUN 13 02/24/2022 0344   BUN 9 03/30/2018 1404   CREATININE 0.86 02/24/2022 0344   CALCIUM 8.7 (L) 02/24/2022 0344   GFRNONAA >60 02/24/2022 0344   GFRAA 129  03/30/2018 1404   COAG Lab Results  Component Value Date   INR 1.0 02/24/2022   INR 1.0 02/23/2022   INR 0.9 02/12/2022     Disposition:  Discharge to :Home  Allergies as of 03/01/2022   No Known Allergies      Medication List     STOP taking these medications    ferrous sulfate 325 (65 FE) MG EC tablet Replaced by:  ferrous sulfate 325 (65 FE) MG tablet       TAKE these medications    acetaminophen 500 MG tablet Commonly known as: TYLENOL Take 1,000 mg by mouth every 6 (six) hours as needed (pain.).   amLODipine 10 MG tablet Commonly known as: NORVASC Take 0.5 tablets (5 mg total) by mouth in the morning. What changed: how much to take   aspirin EC 81 MG tablet Take 1 tablet (81 mg total) by mouth daily. Swallow whole.   atorvastatin 80 MG tablet Commonly known as: LIPITOR Take 1 tablet (80 mg total) by mouth daily.   cilostazol 100 MG tablet Commonly known as: PLETAL Take 1 tablet (100 mg total) by mouth 2 (two) times daily.   clopidogrel 75 MG tablet Commonly known as: PLAVIX Take 1 tablet (75 mg total) by mouth daily.   doxycycline 50 MG capsule Commonly known as: VIBRAMYCIN Take 1 capsule (50 mg total) by mouth 2 (two) times daily.   DULoxetine 30 MG capsule Commonly known as: CYMBALTA Take 90 mg by mouth daily.   ferrous sulfate 325 (65 FE) MG tablet Take 1 tablet (325 mg total) by mouth 2 (two) times daily with a meal. Replaces: ferrous sulfate 325 (65 FE) MG EC tablet   folic acid 400 MCG tablet Commonly known as: FOLVITE Take 400 mcg by mouth daily.   hydroxychloroquine 200 MG tablet Commonly known as: PLAQUENIL Take 200 mg by mouth 2 (two) times daily.   lisinopril 20 MG tablet Commonly known as: ZESTRIL Take 20 mg by mouth daily.   Lyrica 100 MG capsule Generic drug: pregabalin Take 100 mg by mouth 2 (two) times daily.   oxyCODONE-acetaminophen 5-325 MG tablet Commonly known as: PERCOCET/ROXICET Take 1-2 tablets by mouth every 4 (four) hours as needed for moderate pain.       Verbal and written Discharge instructions given to the patient. Wound care per Discharge AVS  Follow-up Information     Georgiana Spinner, NP Follow up.   Specialty: Vascular Surgery Why: 1-2 weeks with FB for staple removal Contact information: [address] [place]  [address] [phone]                 Signed: Georgiana Spinner, NP  03/01/2022, 9:49 AM

## 2022-03-05 LAB — CULTURE, BLOOD (ROUTINE X 2)
Culture: NO GROWTH
Culture: NO GROWTH
Special Requests: ADEQUATE
Special Requests: ADEQUATE

## 2022-03-15 ENCOUNTER — Encounter (INDEPENDENT_AMBULATORY_CARE_PROVIDER_SITE_OTHER): Payer: Self-pay | Admitting: Nurse Practitioner

## 2022-03-15 ENCOUNTER — Ambulatory Visit (INDEPENDENT_AMBULATORY_CARE_PROVIDER_SITE_OTHER): Payer: Medicaid Other | Admitting: Nurse Practitioner

## 2022-03-15 VITALS — BP 126/78 | HR 98 | Resp 16 | Wt 177.0 lb

## 2022-03-15 DIAGNOSIS — Z95828 Presence of other vascular implants and grafts: Secondary | ICD-10-CM

## 2022-03-16 ENCOUNTER — Encounter (INDEPENDENT_AMBULATORY_CARE_PROVIDER_SITE_OTHER): Payer: Self-pay | Admitting: Nurse Practitioner

## 2022-03-16 NOTE — Progress Notes (Signed)
Subjective:    Patient ID: Jeffrey Georges., male    DOB: Aug 08, 1980, 41 y.o.   MRN: [id number] Chief Complaint  Patient presents with   Suture / Staple Removal    1-2 week removal    Doing great is a 41 year old male who returns today for staple removal of his right groin.  The patient has had multiple vascular procedures recently.  He originally underwent angiogram on 02/12/2022 which acutely thrombosed immediately postprocedure.  That same day he underwent surgery including following  PROCEDURE: 1. Right common femoral and superficial femoral endarterectomy with Cormatrix patch angioplasty. 2. Thromboembolectomy of the right limb aortobifemoral bypass graft with a #3 Fogarty. 3. Redo right groin surgery for exposure of the femoral arteries.  The patient's postoperative course was complicated with some swelling and hematoma as well as blood loss anemia.  The patient discharged several days later but returned with pain and worsening swelling in the right groin area.  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  On 02/26/2022 the patient underwent coil embolization of his pseudoaneurysm.  Today the patient notes that he is less winded with being active.  He notes less tachycardia.  He does have some pain discomfort. The area.  No worsening bleeding.  He seems to be progressing well.    Review of Systems  Cardiovascular:  Positive for leg swelling.  Skin:  Positive for wound.  All other systems reviewed and are negative.      Objective:   Physical Exam Vitals reviewed.  HENT:     Head: Normocephalic.  Cardiovascular:     Rate and Rhythm: Normal rate.     Pulses: Normal pulses.     Heart sounds: Normal heart sounds.  Pulmonary:     Effort: Pulmonary effort is normal.  Skin:    General: Skin is warm and dry.  Neurological:     Mental Status: He is alert and oriented to person, place, and time.  Psychiatric:        Mood and Affect: Mood normal.         Behavior: Behavior normal.        Thought Content: Thought content normal.        Judgment: Judgment normal.     BP 126/78 (BP Location: Right Arm)   Pulse 98   Resp 16   Wt 177 lb (80.3 kg)   BMI 26.14 kg/m   Past Medical History:  Diagnosis Date   Anxiety    Atherosclerotic PVD with intermittent claudication (HCC)    Back pain    Collagen vascular disease (HCC)    Dyspnea    Dysrhythmia    Elevated lipids    Hypertension    Nausea    Seropositive rheumatoid arthritis (HCC)     Social History   Socioeconomic History   Marital status: Married    Spouse name: Not on file   Number of children: Not on file   Years of education: Not on file   Highest education level: Not on file  Occupational History   Not on file  Tobacco Use   Smoking status: Former    Packs/day: 0.25    Years: 16.00    Total pack years: 4.00    Types: Cigarettes    Quit date: 06/04/2021    Years since quitting: 0.7   Smokeless tobacco: Never  Vaping Use   Vaping Use: Never used  Substance and Sexual Activity   Alcohol use: No  Drug use: Not Currently   Sexual activity: Yes    Partners: Female  Other Topics Concern   Not on file  Social History Narrative   Not on file   Social Determinants of Health   Financial Resource Strain: Not on file  Food Insecurity: Not on file  Transportation Needs: Not on file  Physical Activity: Not on file  Stress: Not on file  Social Connections: Not on file  Intimate Partner Violence: Not on file    Past Surgical History:  Procedure Laterality Date   ABDOMINAL AORTOGRAM W/LOWER EXTREMITY N/A 02/26/2022   Procedure: ABDOMINAL AORTOGRAM W/LOWER EXTREMITY;  Surgeon: Renford Dills, MD;  Location: [facility];  Service: Cardiovascular;  Laterality: N/A;   AORTA - BILATERAL FEMORAL ARTERY BYPASS GRAFT Bilateral 06/01/2021   Procedure: AORTA BIFEMORAL BYPASS GRAFT;  Surgeon: Renford Dills, MD;  Location: [facility];  Service: Vascular;   Laterality: Bilateral;   APPLICATION OF WOUND VAC Right 02/12/2022   Procedure: APPLICATION OF WOUND VAC;  Surgeon: Renford Dills, MD;  Location: [facility];  Service: Vascular;  Laterality: Right;   COLONOSCOPY WITH PROPOFOL N/A 04/21/2018   Procedure: COLONOSCOPY WITH PROPOFOL;  Surgeon: [name], MD;  Location: [facility];  Service: Gastroenterology;  Laterality: N/A;   EMBOLECTOMY Right 02/12/2022   Procedure: EMBOLECTOMY;  Surgeon: Renford Dills, MD;  Location: [facility];  Service: Vascular;  Laterality: Right;   ENDARTERECTOMY FEMORAL Right 02/12/2022   Procedure: ENDARTERECTOMY FEMORAL;  Surgeon: Renford Dills, MD;  Location: [facility];  Service: Vascular;  Laterality: Right;   ENDARTERECTOMY POPLITEAL Right 06/02/2021   Procedure: Right Femoral Endarterectomy and Patching Endoplasty;  Surgeon: Nada Libman, MD;  Location: [facility];  Service: Vascular;  Laterality: Right;   ESOPHAGOGASTRODUODENOSCOPY (EGD) WITH PROPOFOL N/A 04/21/2018   Procedure: ESOPHAGOGASTRODUODENOSCOPY (EGD) WITH PROPOFOL;  Surgeon: [name], MD;  Location: [facility];  Service: Gastroenterology;  Laterality: N/A;   HIP PINNING Right    LOWER EXTREMITY ANGIOGRAPHY N/A 05/29/2021   Procedure: LOWER EXTREMITY ANGIOGRAPHY;  Surgeon: Renford Dills, MD;  Location: [facility];  Service: Cardiovascular;  Laterality: N/A;   LOWER EXTREMITY ANGIOGRAPHY Left 07/24/2021   Procedure: LOWER EXTREMITY ANGIOGRAPHY;  Surgeon: Renford Dills, MD;  Location: [facility];  Service: Cardiovascular;  Laterality: Left;   LOWER EXTREMITY ANGIOGRAPHY Right 08/07/2021   Procedure: Lower Extremity Angiography;  Surgeon: Renford Dills, MD;  Location: [facility];  Service: Cardiovascular;  Laterality: Right;   LOWER EXTREMITY ANGIOGRAPHY Left 02/12/2022   Procedure: Lower Extremity Angiography;  Surgeon: Renford Dills, MD;  Location: [facility];  Service:  Cardiovascular;  Laterality: Left;   TOTAL HIP ARTHROPLASTY Right 10/22/2016   Procedure: TOTAL HIP ARTHROPLASTY ANTERIOR APPROACH;  Surgeon: Kennedy Bucker, MD;  Location: [facility];  Service: Orthopedics;  Laterality: Right;    History reviewed. No pertinent family history.  No Known Allergies     Latest Ref Rng & Units 03/01/2022    8:35 AM 02/28/2022    4:10 AM 02/27/2022    1:02 PM  CBC  WBC 4.0 - 10.5 K/uL 11.4  10.6  12.7   Hemoglobin 13.0 - 17.0 g/dL 8.7  8.4  9.0   Hematocrit 39.0 - 52.0 % 27.4  26.6  28.2   Platelets 150 - 400 K/uL 424  443  477       CMP     Component Value Date/Time   NA 134 (  L) 02/24/2022 0344   NA 140 03/30/2018 1404   K 4.3 02/24/2022 0344   CL 102 02/24/2022 0344   CO2 23 02/24/2022 0344   GLUCOSE 123 (H) 02/24/2022 0344   BUN 13 02/24/2022 0344   BUN 9 03/30/2018 1404   CREATININE 0.86 02/24/2022 0344   CALCIUM 8.7 (L) 02/24/2022 0344   PROT 7.6 02/24/2022 0344   PROT 7.3 03/30/2018 1404   ALBUMIN 3.8 02/24/2022 0344   ALBUMIN 4.6 03/30/2018 1404   AST 37 02/24/2022 0344   ALT 20 02/24/2022 0344   ALKPHOS 70 02/24/2022 0344   BILITOT 0.9 02/24/2022 0344   BILITOT 0.3 03/30/2018 1404   GFRNONAA >60 02/24/2022 0344   GFRAA 129 03/30/2018 1404     VAS [place] ABI WITH/WO TBI  Result Date: 01/21/2022  LOWER EXTREMITY DOPPLER STUDY Patient Name:  Lavone Francois JR.  Date of Exam:   01/21/2022 Medical Rec #: [id number]          Accession #:    [id number] Date of Birth: November 24, 1980          Patient Gender: M Patient Age:   70 years Exam Location:  [facility] Procedure:      VAS [place] ABI WITH/WO TBI Referring Phys: Sheppard Plumber --------------------------------------------------------------------------------  Indications: Claudication, and peripheral artery disease. High Risk Factors: Hypertension, current smoker.  Vascular Interventions: 06/01/21: Aortobifemoral BPG with bilateral CFA/SFA                         endarterectomies;                          06/02/21: Right SFA endarterectomy/angioplasty;                          07/24/2021: Left Lower Extremity Angiogram Third order                         catheter placement. Angioplasty and Stent placement Left                         SFA and Popliteal Artery.                          08/07/2021: PTA and Stent placement Right SFA. Performing Technologist: Hardie Lora RVT  Examination Guidelines: A complete evaluation includes at minimum, Doppler waveform signals and systolic blood pressure reading at the level of bilateral brachial, anterior tibial, and posterior tibial arteries, when vessel segments are accessible. Bilateral testing is considered an integral part of a complete examination. Photoelectric Plethysmograph (PPG) waveforms and toe systolic pressure readings are included as required and additional duplex testing as needed. Limited examinations for reoccurring indications may be performed as noted.  ABI Findings: +---------+------------------+-----+----------+--------+ Right    Rt Pressure (mmHg)IndexWaveform  Comment  +---------+------------------+-----+----------+--------+ Brachial 117                                       +---------+------------------+-----+----------+--------+ ATA      58                0.50 monophasic         +---------+------------------+-----+----------+--------+ PTA      82  0.70 monophasic         +---------+------------------+-----+----------+--------+ PERO     70                0.60 monophasic         +---------+------------------+-----+----------+--------+ Great Toe0                 0.00                    +---------+------------------+-----+----------+--------+ +---------+------------------+-----+----------+-------+ Left     Lt Pressure (mmHg)IndexWaveform  Comment +---------+------------------+-----+----------+-------+ Brachial 108                                       +---------+------------------+-----+----------+-------+ ATA      69                0.59 monophasic        +---------+------------------+-----+----------+-------+ PTA      73                0.62 monophasic        +---------+------------------+-----+----------+-------+ Great Toe62                0.53                   +---------+------------------+-----+----------+-------+ +-------+-----------+-----------+------------+------------+ ABI/TBIToday's ABIToday's TBIPrevious ABIPrevious TBI +-------+-----------+-----------+------------+------------+ Right  0.70       0.00       0.91        0.78         +-------+-----------+-----------+------------+------------+ Left   0.62       0.53       0.92        0.98         +-------+-----------+-----------+------------+------------+ Bilateral ABIs appear decreased compared to prior study on 10/19/2021.  Summary: Right: Resting right ankle-brachial index indicates moderate right lower extremity arterial disease. The right toe-brachial index is abnormal. Left: Resting left ankle-brachial index indicates moderate left lower extremity arterial disease. The left toe-brachial index is abnormal. *See table(s) above for measurements and observations.  Electronically signed by [name] MD on 01/21/2022 at 5:12:23 PM.    Final        Assessment & Plan:   1. S/P femoral-popliteal bypass surgery All right groin staples removed.  Incision is well-healed.  Patient continues to have some swelling in the right thigh area.  Patient had a hematoma postoperatively and this is still likely receding.  Patient continued with elevation.  He is advised he can increase activity as he is able to tolerate.  He should still avoid lifting however for 6 to 8 weeks postsurgery.  Plan the patient return in 2 to 3 weeks for noninvasive studies.   Current Outpatient Medications on File Prior to Visit  Medication Sig Dispense Refill   acetaminophen (TYLENOL) 500 MG  tablet Take 1,000 mg by mouth every 6 (six) hours as needed (pain.).     amLODipine (NORVASC) 10 MG tablet Take 0.5 tablets (5 mg total) by mouth in the morning. (Patient taking differently: Take 10 mg by mouth in the morning.) 90 tablet 0   aspirin EC 81 MG EC tablet Take 1 tablet (81 mg total) by mouth daily. Swallow whole. 30 tablet 11   atorvastatin (LIPITOR) 80 MG tablet Take 1 tablet (80 mg total) by mouth daily. 90 tablet 1   cilostazol (PLETAL) 100 MG tablet Take 1 tablet (100 mg  total) by mouth 2 (two) times daily. 60 tablet 4   clopidogrel (PLAVIX) 75 MG tablet Take 1 tablet (75 mg total) by mouth daily. 90 tablet 1   doxycycline (VIBRAMYCIN) 50 MG capsule Take 1 capsule (50 mg total) by mouth 2 (two) times daily. 20 capsule 0   DULoxetine (CYMBALTA) 30 MG capsule Take 90 mg by mouth daily.     ferrous sulfate 325 (65 FE) MG tablet Take 1 tablet (325 mg total) by mouth 2 (two) times daily with a meal. 30 tablet 3   folic acid (FOLVITE) 400 MCG tablet Take 400 mcg by mouth daily.     hydroxychloroquine (PLAQUENIL) 200 MG tablet Take 200 mg by mouth 2 (two) times daily.     lisinopril (ZESTRIL) 20 MG tablet Take 20 mg by mouth daily.     LYRICA 100 MG capsule Take 100 mg by mouth 2 (two) times daily.     oxyCODONE-acetaminophen (PERCOCET/ROXICET) 5-325 MG tablet Take 1-2 tablets by mouth every 4 (four) hours as needed for moderate pain. 30 tablet 0   No current facility-administered medications on file prior to visit.    There are no Patient Instructions on file for this visit. No follow-ups on file.   Georgiana Spinner, NP

## 2022-03-26 ENCOUNTER — Other Ambulatory Visit (INDEPENDENT_AMBULATORY_CARE_PROVIDER_SITE_OTHER): Payer: Self-pay | Admitting: Nurse Practitioner

## 2022-03-26 DIAGNOSIS — Z9889 Other specified postprocedural states: Secondary | ICD-10-CM

## 2022-03-28 ENCOUNTER — Encounter (INDEPENDENT_AMBULATORY_CARE_PROVIDER_SITE_OTHER): Payer: Self-pay | Admitting: Nurse Practitioner

## 2022-03-28 ENCOUNTER — Ambulatory Visit (INDEPENDENT_AMBULATORY_CARE_PROVIDER_SITE_OTHER): Payer: Medicaid Other | Admitting: Nurse Practitioner

## 2022-03-28 ENCOUNTER — Ambulatory Visit (INDEPENDENT_AMBULATORY_CARE_PROVIDER_SITE_OTHER): Payer: Medicaid Other

## 2022-03-28 VITALS — BP 122/71 | HR 72 | Resp 16 | Wt 181.0 lb

## 2022-03-28 DIAGNOSIS — I739 Peripheral vascular disease, unspecified: Secondary | ICD-10-CM

## 2022-03-28 DIAGNOSIS — Z9889 Other specified postprocedural states: Secondary | ICD-10-CM | POA: Diagnosis not present

## 2022-03-30 NOTE — Progress Notes (Signed)
Subjective:    Patient ID: Jeffrey Grant., male    DOB: 1980-08-05, 41 y.o.   MRN: [id number] Chief Complaint  Patient presents with   Follow-up    Ultrasound follow up    Airam Heidecker is a 41 year old male who returns today for staple removal of his right groin.  The patient has had multiple vascular procedures recently.  He originally underwent angiogram on 02/12/2022 which acutely thrombosed immediately postprocedure.  That same day he underwent surgery including following:  PROCEDURE: 1. Right common femoral and superficial femoral endarterectomy with Cormatrix patch angioplasty. 2. Thromboembolectomy of the right limb aortobifemoral bypass graft with a #3 Fogarty. 3. Redo right groin surgery for exposure of the femoral arteries.  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  Since that time the patient has done well.  He notes that his foot drop has improved.  He also notes that he is ready to resume more physical activity.  Today's noninvasive studies show an ABI 0 point TBI on the right and 0.54 with left 0.73.  The right has biphasic tibial artery phasic/biphasic slightly dampened toe waveforms bilaterally.    Review of Systems  Cardiovascular:  Positive for leg swelling.  All other systems reviewed and are negative.      Objective:   Physical Exam Vitals reviewed.  HENT:     Head: Normocephalic.  Cardiovascular:     Rate and Rhythm: Normal rate.     Pulses:          Dorsalis pedis pulses are detected w/ Doppler on the right side and detected w/ Doppler on the left side.       Posterior tibial pulses are detected w/ Doppler on the right side and detected w/ Doppler on the left side.  Pulmonary:     Effort: Pulmonary effort is normal.  Musculoskeletal:     Right lower leg: Edema present.  Skin:    General: Skin is warm and dry.  Neurological:     Mental  Status: He is alert and oriented to person, place, and time.  Psychiatric:        Mood and Affect: Mood normal.        Behavior: Behavior normal.        Thought Content: Thought content normal.        Judgment: Judgment normal.     BP 122/71 (BP Location: Right Arm)   Pulse 72   Resp 16   Wt 181 lb (82.1 kg)   BMI 26.73 kg/m   Past Medical History:  Diagnosis Date   Anxiety    Atherosclerotic PVD with intermittent claudication (HCC)    Back pain    Collagen vascular disease (HCC)    Dyspnea    Dysrhythmia    Elevated lipids    Hypertension    Nausea    Seropositive rheumatoid arthritis (HCC)     Social History   Socioeconomic History   Marital status: Married    Spouse name: Not on file   Number of children: Not on file   Years of education: Not on file   Highest education level: Not on file  Occupational History   Not on file  Tobacco Use   Smoking status: Former    Packs/day: 0.25    Years: 16.00    Total pack years: 4.00    Types: Cigarettes    Quit date: 06/04/2021  Years since quitting: 0.8   Smokeless tobacco: Never  Vaping Use   Vaping Use: Never used  Substance and Sexual Activity   Alcohol use: No   Drug use: Not Currently   Sexual activity: Yes    Partners: Female  Other Topics Concern   Not on file  Social History Narrative   Not on file   Social Determinants of Health   Financial Resource Strain: Not on file  Food Insecurity: Not on file  Transportation Needs: Not on file  Physical Activity: Not on file  Stress: Not on file  Social Connections: Not on file  Intimate Partner Violence: Not on file    Past Surgical History:  Procedure Laterality Date   ABDOMINAL AORTOGRAM W/LOWER EXTREMITY N/A 02/26/2022   Procedure: ABDOMINAL AORTOGRAM W/LOWER EXTREMITY;  Surgeon: Renford Dills, MD;  Location: [facility];  Service: Cardiovascular;  Laterality: N/A;   AORTA - BILATERAL FEMORAL ARTERY BYPASS GRAFT Bilateral 06/01/2021    Procedure: AORTA BIFEMORAL BYPASS GRAFT;  Surgeon: Renford Dills, MD;  Location: [facility];  Service: Vascular;  Laterality: Bilateral;   APPLICATION OF WOUND VAC Right 02/12/2022   Procedure: APPLICATION OF WOUND VAC;  Surgeon: Renford Dills, MD;  Location: [facility];  Service: Vascular;  Laterality: Right;   COLONOSCOPY WITH PROPOFOL N/A 04/21/2018   Procedure: COLONOSCOPY WITH PROPOFOL;  Surgeon: [name], MD;  Location: [facility];  Service: Gastroenterology;  Laterality: N/A;   EMBOLECTOMY Right 02/12/2022   Procedure: EMBOLECTOMY;  Surgeon: Renford Dills, MD;  Location: [facility];  Service: Vascular;  Laterality: Right;   ENDARTERECTOMY FEMORAL Right 02/12/2022   Procedure: ENDARTERECTOMY FEMORAL;  Surgeon: Renford Dills, MD;  Location: [facility];  Service: Vascular;  Laterality: Right;   ENDARTERECTOMY POPLITEAL Right 06/02/2021   Procedure: Right Femoral Endarterectomy and Patching Endoplasty;  Surgeon: Nada Libman, MD;  Location: [facility];  Service: Vascular;  Laterality: Right;   ESOPHAGOGASTRODUODENOSCOPY (EGD) WITH PROPOFOL N/A 04/21/2018   Procedure: ESOPHAGOGASTRODUODENOSCOPY (EGD) WITH PROPOFOL;  Surgeon: [name], MD;  Location[facility] END[facility]COPY;  Service: Gastroenterology;  Laterality: N/A;   HIP PINNING Right    LOWER EXTREMITY ANGIOGRAPHY N/A 05/29/2021   Procedure: LOWER EXTREMITY ANGIOGRAPHY;  Surgeon: Renford Dills, MD;  Location: [facility];  Service: Cardiovascular;  Laterality: N/A;   LOWER EXTREMITY ANGIOGRAPHY Left 07/24/2021   Procedure: LOWER EXTREMITY ANGIOGRAPHY;  Surgeon: Renford Dills, MD;  Location: [facility];  Service: Cardiovascular;  Laterality: Left;   LOWER EXTREMITY ANGIOGRAPHY Right 08/07/2021   Procedure: Lower Extremity Angiography;  Surgeon: Renford Dills, MD;  Location: [facility];  Service: Cardiovascular;  Laterality: Right;   LOWER EXTREMITY ANGIOGRAPHY Left 02/12/2022    Procedure: Lower Extremity Angiography;  Surgeon: Renford Dills, MD;  Location: [facility];  Service: Cardiovascular;  Laterality: Left;   TOTAL HIP ARTHROPLASTY Right 10/22/2016   Procedure: TOTAL HIP ARTHROPLASTY ANTERIOR APPROACH;  Surgeon: Kennedy Bucker, MD;  Location: [facility] OR[facility];  Service: Orthopedics;  Laterality: Right;    History reviewed. No pertinent family history.  No Known Allergies     Latest Ref Rng & Units 03/01/2022    8:35 AM 02/28/2022    4:10 AM 02/27/2022    1:02 PM  CBC  WBC 4.0 - 10.5 K/uL 11.4  10.6  12.7   Hemoglobin 13.0 - 17.0 g/dL 8.7  8.4  9.0   Hematocrit 39.0 - 52.0 % 27.4  26.6  28.2  Platelets 150 - 400 K/uL 424  443  477       CMP     Component Value Date/Time   NA 134 (L) 02/24/2022 0344   NA 140 03/30/2018 1404   K 4.3 02/24/2022 0344   CL 102 02/24/2022 0344   CO2 23 02/24/2022 0344   GLUCOSE 123 (H) 02/24/2022 0344   BUN 13 02/24/2022 0344   BUN 9 03/30/2018 1404   CREATININE 0.86 02/24/2022 0344   CALCIUM 8.7 (L) 02/24/2022 0344   PROT 7.6 02/24/2022 0344   PROT 7.3 03/30/2018 1404   ALBUMIN 3.8 02/24/2022 0344   ALBUMIN 4.6 03/30/2018 1404   AST 37 02/24/2022 0344   ALT 20 02/24/2022 0344   ALKPHOS 70 02/24/2022 0344   BILITOT 0.9 02/24/2022 0344   BILITOT 0.3 03/30/2018 1404   GFRNONAA >60 02/24/2022 0344   GFRAA 129 03/30/2018 1404     VAS [place] ABI WITH/WO TBI  Result Date: 03/28/2022  LOWER EXTREMITY DOPPLER STUDY Patient Name:  Nichlos Kunzler  Date of Exam:   03/28/2022 Medical Rec #: [id number]      Accession #:    [id number] Date of Birth: Jan 26, 1981      Patient Gender: M Patient Age:   47 years Exam Location:  [facility] Procedure:      VAS [place] ABI WITH/WO TBI Referring Phys: --------------------------------------------------------------------------------  Indications: Claudication, and peripheral artery disease. High Risk Factors: Hypertension.  Vascular Interventions: 06/01/21: Aortobifemoral BPG with  bilateral CFA/SFA                         endarterectomies;                         06/02/21: Right SFA endarterectomy/angioplasty;                          07/24/2021: Left Lower Extremity Angiogram Third order                         catheter placement. Angioplasty and Stent placement Left                         SFA and Popliteal Artery.                          08/07/2021: PTA and Stent placement Right SFA.                         02/26/2022 right fem pop bypass. Performing Technologist: Salvadore Farber RVT  Examination Guidelines: A complete evaluation includes at minimum, Doppler waveform signals and systolic blood pressure reading at the level of bilateral brachial, anterior tibial, and posterior tibial arteries, when vessel segments are accessible. Bilateral testing is considered an integral part of a complete examination. Photoelectric Plethysmograph (PPG) waveforms and toe systolic pressure readings are included as required and additional duplex testing as needed. Limited examinations for reoccurring indications may be performed as noted.  ABI Findings: +---------+------------------+-----+--------+--------+ Right    Rt Pressure (mmHg)IndexWaveformComment  +---------+------------------+-----+--------+--------+ Brachial 123                                     +---------+------------------+-----+--------+--------+ ATA  120               0.98 biphasic         +---------+------------------+-----+--------+--------+ PTA      121               0.98 biphasic         +---------+------------------+-----+--------+--------+ Great Toe66                0.54 Abnormal         +---------+------------------+-----+--------+--------+ +---------+------------------+-----+----------+-------+ Left     Lt Pressure (mmHg)IndexWaveform  Comment +---------+------------------+-----+----------+-------+ Brachial 120                                       +---------+------------------+-----+----------+-------+ ATA      108               0.88 monophasic        +---------+------------------+-----+----------+-------+ PTA      117               0.95 biphasic          +---------+------------------+-----+----------+-------+ Great Toe90                0.73 Abnormal          +---------+------------------+-----+----------+-------+ Right ABIs and TBIs appear increased compared to prior study on 12/2021.  Summary: Right: Resting right ankle-brachial index is within normal range. The right toe-brachial index is abnormal. Left: Resting left ankle-brachial index is within normal range. The left toe-brachial index is normal. *See table(s) above for measurements and observations.  Electronically signed by [name] MD on 03/28/2022 at 5:04:00 PM.    Final        Assessment & Plan:   1. PAD (peripheral artery disease) (HCC) Patient is doing well following postintervention.  Patient advised that he can resume light activity.  Patient should wait for heavy weightlifting for approximately 6 weeks from his original surgery at that point he should begin lightly lifting.  He should continue with antiplatelet therapy.  We will plan to have the patient return in 3 months with noninvasive studies unless issues arise.   Current Outpatient Medications on File Prior to Visit  Medication Sig Dispense Refill   acetaminophen (TYLENOL) 500 MG tablet Take 1,000 mg by mouth every 6 (six) hours as needed (pain.).     amLODipine (NORVASC) 10 MG tablet Take 0.5 tablets (5 mg total) by mouth in the morning. (Patient taking differently: Take 10 mg by mouth in the morning.) 90 tablet 0   aspirin EC 81 MG EC tablet Take 1 tablet (81 mg total) by mouth daily. Swallow whole. 30 tablet 11   atorvastatin (LIPITOR) 80 MG tablet Take 1 tablet (80 mg total) by mouth daily. 90 tablet 1   cilostazol (PLETAL) 100 MG tablet Take 1 tablet (100 mg total) by mouth 2 (two) times daily.  60 tablet 4   clopidogrel (PLAVIX) 75 MG tablet Take 1 tablet (75 mg total) by mouth daily. 90 tablet 1   doxycycline (VIBRAMYCIN) 50 MG capsule Take 1 capsule (50 mg total) by mouth 2 (two) times daily. 20 capsule 0   DULoxetine (CYMBALTA) 30 MG capsule Take 90 mg by mouth daily.     ferrous sulfate 325 (65 FE) MG tablet Take 1 tablet (325 mg total) by mouth 2 (two) times daily with a meal. 30 tablet 3   folic acid (FOLVITE)  400 MCG tablet Take 400 mcg by mouth daily.     hydroxychloroquine (PLAQUENIL) 200 MG tablet Take 200 mg by mouth 2 (two) times daily.     lisinopril (ZESTRIL) 20 MG tablet Take 20 mg by mouth daily.     LYRICA 100 MG capsule Take 100 mg by mouth 2 (two) times daily.     oxyCODONE-acetaminophen (PERCOCET/ROXICET) 5-325 MG tablet Take 1-2 tablets by mouth every 4 (four) hours as needed for moderate pain. 30 tablet 0   No current facility-administered medications on file prior to visit.    There are no Patient Instructions on file for this visit. No follow-ups on file.   Kris Hartmann, NP

## 2022-03-31 ENCOUNTER — Encounter (INDEPENDENT_AMBULATORY_CARE_PROVIDER_SITE_OTHER): Payer: Self-pay | Admitting: Nurse Practitioner

## 2022-04-22 ENCOUNTER — Encounter (INDEPENDENT_AMBULATORY_CARE_PROVIDER_SITE_OTHER): Payer: Self-pay

## 2022-05-05 ENCOUNTER — Emergency Department
Admission: EM | Admit: 2022-05-05 | Discharge: 2022-05-06 | Disposition: A | Discharge status: Home / Self Care | Payer: Medicaid Other | Attending: Emergency Medicine | Admitting: Emergency Medicine

## 2022-05-05 ENCOUNTER — Emergency Department: Payer: Medicaid Other

## 2022-05-05 ENCOUNTER — Other Ambulatory Visit: Payer: Self-pay

## 2022-05-05 DIAGNOSIS — Z1152 Encounter for screening for COVID-19: Secondary | ICD-10-CM | POA: Insufficient documentation

## 2022-05-05 DIAGNOSIS — K2900 Acute gastritis without bleeding: Secondary | ICD-10-CM | POA: Diagnosis not present

## 2022-05-05 DIAGNOSIS — D72829 Elevated white blood cell count, unspecified: Secondary | ICD-10-CM | POA: Diagnosis not present

## 2022-05-05 DIAGNOSIS — R112 Nausea with vomiting, unspecified: Secondary | ICD-10-CM | POA: Diagnosis present

## 2022-05-05 LAB — CBC
HCT: 41.7 % (ref 39.0–52.0)
Hemoglobin: 13.8 g/dL (ref 13.0–17.0)
MCH: 28.3 pg (ref 26.0–34.0)
MCHC: 33.1 g/dL (ref 30.0–36.0)
MCV: 85.6 fL (ref 80.0–100.0)
Platelets: 307 10*3/uL (ref 150–400)
RBC: 4.87 MIL/uL (ref 4.22–5.81)
RDW: 15.4 % (ref 11.5–15.5)
WBC: 11.8 10*3/uL — ABNORMAL HIGH (ref 4.0–10.5)
nRBC: 0 % (ref 0.0–0.2)

## 2022-05-05 LAB — BASIC METABOLIC PANEL
Anion gap: 9 (ref 5–15)
BUN: 18 mg/dL (ref 6–20)
CO2: 22 mmol/L (ref 22–32)
Calcium: 10.1 mg/dL (ref 8.9–10.3)
Chloride: 106 mmol/L (ref 98–111)
Creatinine, Ser: 0.9 mg/dL (ref 0.61–1.24)
GFR, Estimated: >60 mL/min (ref >60)
Glucose, Bld: 145 mg/dL — ABNORMAL HIGH (ref 70–99)
Potassium: 4.2 mmol/L (ref 3.5–5.1)
Sodium: 137 mmol/L (ref 135–145)

## 2022-05-05 LAB — RESP PANEL BY RT-PCR (FLU A&B, COVID) ARPGX2
Influenza A by PCR: NEGATIVE
Influenza B by PCR: NEGATIVE
SARS Coronavirus 2 by RT PCR: NEGATIVE

## 2022-05-05 LAB — TROPONIN I (HIGH SENSITIVITY): Troponin I (High Sensitivity): 7 ng/L (ref <18)

## 2022-05-05 MED ORDER — IOHEXOL 350 MG/ML SOLN
100.0000 mL | Freq: Once | INTRAVENOUS | Status: AC | PRN
Start: 2022-05-05 — End: 2022-05-05
  Administered 2022-05-05: 100 mL via INTRAVENOUS

## 2022-05-05 MED ORDER — SODIUM CHLORIDE 0.9 % IV SOLN: Freq: Once | INTRAVENOUS | Status: AC

## 2022-05-05 MED ORDER — SODIUM CHLORIDE 0.9 % IV SOLN
25.0000 mg | Freq: Once | INTRAVENOUS | Status: AC
  Administered 2022-05-05: 25 mg via INTRAVENOUS
  Filled 2022-05-05: qty 25

## 2022-05-05 MED ORDER — ONDANSETRON HCL 4 MG/2ML IJ SOLN
4.0000 mg | Freq: Once | INTRAMUSCULAR | Status: AC
  Administered 2022-05-05: 4 mg via INTRAVENOUS
  Filled 2022-05-05: qty 2

## 2022-05-05 NOTE — ED Notes (Signed)
Pt states relief from nausea, leg and arm tightness.  No diaphoresis noted at this time.

## 2022-05-05 NOTE — ED Provider Notes (Signed)
[facility] Provider Note    Event Date/Time   First MD Initiated Contact with Patient 05/05/22 1726     (approximate)   History   Nausea and vomiting  HPI  Jeffrey Grant. is a 41 y.o. male with a history of peripheral arterial disease who presents with complaints of nausea and vomiting.  Patient reports he woke up this morning with chills and sweats, he then started having nausea and vomiting.  He denies abdominal pain.  No diarrhea.  No sick contacts reported.  He checked his temperature and it was 98.1     Physical Exam   Triage Vital Signs: ED Triage Vitals  Enc Vitals Group     BP 05/05/22 1637 (!) 127/97     Pulse Rate 05/05/22 1637 (!) 133     Resp 05/05/22 1637 20     Temp 05/05/22 1645 98.6 F (37 C)     Temp Source 05/05/22 1645 Oral     SpO2 05/05/22 1637 100 %     Weight 05/05/22 1638 83.5 kg (184 lb)     Height 05/05/22 1638 1.753 m (5\' 9" )     Head Circumference --      Peak Flow --      Pain Score 05/05/22 1637 6     Pain Loc --      Pain Edu? --      Excl. in GC? --     Most recent vital signs: Vitals:   05/05/22 2026 05/05/22 2154  BP: (!) 163/110 (!) 145/103  Pulse: (!) 125 (!) 116  Resp: 20 18  Temp:  98.5 F (36.9 C)  SpO2: 100% 100%     General: Awake, no distress.  CV:  Good peripheral perfusion.  Resp:  Normal effort.  Clear to auscultation bilaterally Abd:  No distention.  Soft, nontender Other:  Lower is warm and well-perfused, he has no pain in his lower extremities   ED Results / Procedures / Treatments   Labs (all labs ordered are listed, but only abnormal results are displayed) Labs Reviewed  BASIC METABOLIC PANEL - Abnormal; Notable for the following components:      Result Value   Glucose, Bld 145 (*)    All other components within normal limits  CBC - Abnormal; Notable for the following components:   WBC 11.8 (*)    All other components within normal limits  RESP PANEL BY RT-PCR (FLU  A&B, COVID) ARPGX2  TROPONIN I (HIGH SENSITIVITY)     EKG  ED ECG REPORT I, 2155, the attending physician, personally viewed and interpreted this ECG.  Date: 05/05/2022  Rhythm: Sinus tachycardia QRS Axis: normal Intervals: normal ST/T Wave abnormalities: normal Narrative Interpretation: no evidence of acute ischemia    RADIOLOGY Chest x-ray viewed interpret by me, no acute abnormality    PROCEDURES:  Critical Care performed:   Procedures   MEDICATIONS ORDERED IN ED: Medications  ondansetron (ZOFRAN) injection 4 mg (4 mg Intravenous Given 05/05/22 1834)  0.9 %  sodium chloride infusion (0 mLs Intravenous Stopped 05/05/22 2118)  promethazine (PHENERGAN) 25 mg in sodium chloride 0.9 % 50 mL IVPB (0 mg Intravenous Stopped 05/05/22 2153)     IMPRESSION / MDM / ASSESSMENT AND PLAN / ED COURSE  I reviewed the triage vital signs and the nursing notes. Patient's presentation is most consistent with acute presentation with potential threat to life or bodily function.  Patient presents with nausea and vomiting in the setting  of severe PAD with history of aortobifemoral bypass.  He is significantly tachycardic upon arrival initially complained of shortness of breath however that has resolved without intervention.  He has no extremity symptoms.  I suspect his symptoms may be related to a viral illness such as influenza or COVID or norovirus.  No abdominal tenderness to palpation and he denies abdominal pain at this time  Lab work reviewed and is reassuring, mild elevation of white blood cell count is nonspecific, normal high sensitive troponin.  We will treat with IV fluids, IV Zofran, check COVID/flu PCR and reevaluate  Patient with continued nausea after Zofran, IV Phenergan given  COVID flu is negative.  Patient feeling improved but feels like he is having "whole body cramps "and still having hot flashes, I will send for CT angiography given unusual  symptoms      FINAL CLINICAL IMPRESSION(S) / ED DIAGNOSES   Final diagnoses:  Acute gastritis without hemorrhage, unspecified gastritis type     Rx / DC Orders   ED Discharge Orders     None        Note:  This document was prepared using Dragon voice recognition software and may include unintentional dictation errors.   Jene Every, MD 05/05/22 2322

## 2022-05-05 NOTE — ED Notes (Signed)
Pt denies nausea at this time.

## 2022-05-05 NOTE — ED Triage Notes (Signed)
Pt to ED stating at 5am started having hot flashes cold sweats. Endorses vomiting and SOB. Denies any CP. 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

## 2022-05-05 NOTE — ED Notes (Signed)
Requested nausea medication order from EDP Kinner.

## 2022-05-05 NOTE — ED Notes (Signed)
Pt given warm blankets per his request.

## 2022-05-05 NOTE — ED Notes (Signed)
IV team consult put in.  Unsuccessful at getting a line above the wrist.  Delay in CT angio. EDP Kinner notified.

## 2022-05-05 NOTE — ED Notes (Signed)
Notified EDP Kinner that pt states tightness in bilat arms and legs, denies pain.  Pt diaphoretic and Nauseas at this time, phenergan being infused.

## 2022-05-06 MED ORDER — SODIUM CHLORIDE 0.9 % IV BOLUS
1000.0000 mL | Freq: Once | INTRAVENOUS | Status: AC
  Administered 2022-05-06: 1000 mL via INTRAVENOUS

## 2022-05-06 MED ORDER — DROPERIDOL 2.5 MG/ML IJ SOLN: 2.5000 mg | Freq: Once | INTRAMUSCULAR | Status: DC

## 2022-05-06 MED ORDER — LIDOCAINE VISCOUS HCL 2 % MT SOLN
15.0000 mL | Freq: Once | OROMUCOSAL | Status: AC
  Administered 2022-05-06: 15 mL via ORAL
  Filled 2022-05-06: qty 15

## 2022-05-06 MED ORDER — PANTOPRAZOLE SODIUM 40 MG PO TBEC: 40.0000 mg | DELAYED_RELEASE_TABLET | Freq: Every day | ORAL | 0 refills | Status: DC

## 2022-05-06 MED ORDER — FAMOTIDINE 20 MG PO TABS
20.0000 mg | ORAL_TABLET | Freq: Once | ORAL | Status: AC
  Administered 2022-05-06: 20 mg via ORAL
  Filled 2022-05-06: qty 1

## 2022-05-06 MED ORDER — ALUM & MAG HYDROXIDE-SIMETH 200-200-20 MG/5ML PO SUSP
30.0000 mL | Freq: Once | ORAL | Status: AC
  Administered 2022-05-06: 30 mL via ORAL
  Filled 2022-05-06: qty 30

## 2022-05-06 MED ORDER — OXYCODONE-ACETAMINOPHEN 5-325 MG PO TABS
2.0000 | ORAL_TABLET | Freq: Once | ORAL | Status: AC
  Administered 2022-05-06: 2 via ORAL
  Filled 2022-05-06: qty 2

## 2022-05-06 NOTE — ED Provider Notes (Signed)
I received signout on this patient with nausea vomiting and epigastric pain, pending a CT angiogram of the abdomen pelvis which reveals no acute emergent pathology to explain his symptoms.  His heart rate was elevated in the setting of vomiting and improved with crystalloid boluses via IV and with pain control of his chronic leg pain with his home oxycodone and gastritis medications.  He is comfortable at this time and safe for discharge home.   Pilar Jarvis, MD 05/06/22 [phone]

## 2022-05-06 NOTE — Discharge Instructions (Addendum)
   Thank you for choosing us for your health care today!  Please see your primary doctor this week for a follow up appointment.   If you do not have a primary doctor call the following clinics to establish care:  If you have insurance:  [facility] [phone] [address] [place] [address]   [facility]  [phone] [address] [place] [address]   If you do not have insurance:  [facility]  [phone] [address] [place] [address]  Sometimes, in the early stages of certain disease courses it is difficult to detect in the emergency department evaluation -- so, it is important that you continue to monitor your symptoms and call your doctor right away or return to the emergency department if you develop any new or worsening symptoms.  It was my pleasure to care for you today.   Daran Favaro S. Suda Forbess, MD  

## 2022-05-06 NOTE — ED Notes (Signed)
EDP Modesto Charon notified of pt elevated HR. No new orders.  D/C orders stand per EDP Modesto Charon.

## 2022-06-10 ENCOUNTER — Emergency Department: Payer: Medicaid Other

## 2022-06-10 ENCOUNTER — Other Ambulatory Visit: Payer: Self-pay

## 2022-06-10 DIAGNOSIS — Z1152 Encounter for screening for COVID-19: Secondary | ICD-10-CM | POA: Insufficient documentation

## 2022-06-10 DIAGNOSIS — M791 Myalgia, unspecified site: Secondary | ICD-10-CM | POA: Diagnosis not present

## 2022-06-10 DIAGNOSIS — R079 Chest pain, unspecified: Secondary | ICD-10-CM | POA: Insufficient documentation

## 2022-06-10 DIAGNOSIS — R109 Unspecified abdominal pain: Secondary | ICD-10-CM | POA: Insufficient documentation

## 2022-06-10 DIAGNOSIS — R110 Nausea: Secondary | ICD-10-CM | POA: Insufficient documentation

## 2022-06-10 DIAGNOSIS — Z5321 Procedure and treatment not carried out due to patient leaving prior to being seen by health care provider: Secondary | ICD-10-CM | POA: Diagnosis not present

## 2022-06-10 DIAGNOSIS — R6883 Chills (without fever): Secondary | ICD-10-CM | POA: Diagnosis not present

## 2022-06-10 DIAGNOSIS — R112 Nausea with vomiting, unspecified: Secondary | ICD-10-CM | POA: Insufficient documentation

## 2022-06-10 LAB — BASIC METABOLIC PANEL
Anion gap: 13 (ref 5–15)
BUN: 14 mg/dL (ref 6–20)
CO2: 22 mmol/L (ref 22–32)
Calcium: 9.7 mg/dL (ref 8.9–10.3)
Chloride: 103 mmol/L (ref 98–111)
Creatinine, Ser: 0.77 mg/dL (ref 0.61–1.24)
GFR, Estimated: >60 mL/min (ref >60)
Glucose, Bld: 142 mg/dL — ABNORMAL HIGH (ref 70–99)
Potassium: 3.9 mmol/L (ref 3.5–5.1)
Sodium: 138 mmol/L (ref 135–145)

## 2022-06-10 LAB — CBC
HCT: 42.4 % (ref 39.0–52.0)
Hemoglobin: 14 g/dL (ref 13.0–17.0)
MCH: 27.9 pg (ref 26.0–34.0)
MCHC: 33 g/dL (ref 30.0–36.0)
MCV: 84.6 fL (ref 80.0–100.0)
Platelets: 338 10*3/uL (ref 150–400)
RBC: 5.01 MIL/uL (ref 4.22–5.81)
RDW: 15.8 % — ABNORMAL HIGH (ref 11.5–15.5)
WBC: 15.5 10*3/uL — ABNORMAL HIGH (ref 4.0–10.5)
nRBC: 0 % (ref 0.0–0.2)

## 2022-06-10 LAB — LIPASE, BLOOD: Lipase: 48 U/L (ref 11–51)

## 2022-06-10 LAB — TROPONIN I (HIGH SENSITIVITY): Troponin I (High Sensitivity): 6 ng/L (ref <18)

## 2022-06-10 LAB — RESP PANEL BY RT-PCR (RSV, FLU A&B, COVID)  RVPGX2
Influenza A by PCR: NEGATIVE
Influenza B by PCR: NEGATIVE
Resp Syncytial Virus by PCR: NEGATIVE
SARS Coronavirus 2 by RT PCR: NEGATIVE

## 2022-06-10 LAB — PROTIME-INR
INR: 1 (ref 0.8–1.2)
Prothrombin Time: 13.2 seconds (ref 11.4–15.2)

## 2022-06-10 NOTE — ED Notes (Signed)
Provider states patient should be on monitor. First nurse made aware. No rooms and no monitors available at this time.

## 2022-06-10 NOTE — ED Provider Triage Note (Signed)
Emergency Medicine Provider Triage Evaluation Note  Jeffrey Grant. , a 41 y.o. male  was evaluated in triage.  Pt complains of chills, body aches, chest pain, abdominal pain.  Patient is in rigors right now with profuse sweating..  Review of Systems  Positive: Chest pain, abdominal pain, chills Negative: Fever, cough, shortness of breath, emesis, diarrhea  Physical Exam  BP (!) 147/111 (BP Location: Left Arm)   Pulse (!) 132   Temp 97.8 F (36.6 C) (Oral)   Resp (!) 24   Ht 5\' 9"  (1.753 m)   Wt 81.6 kg   SpO2 99%   BMI 26.58 kg/m  Gen:   Awake, no distress   Resp:  Normal effort  MSK:   Moves extremities without difficulty  Other:    Medical Decision Making  Medically screening exam initiated at 9:45 PM.  Appropriate orders placed.  . was informed that the remainder of the evaluation will be completed by another provider, this initial triage assessment does not replace that evaluation, and the importance of remaining in the ED until their evaluation is complete.  Patient presents with chest pain, abdominal pain, body aches, chills and profuse sweating.  Patient has rigors on exam.  Patient has a cardiac history.  Labs, EKG, chest x-ray, COVID flu swab, urinalysis.   [name], PA-C 06/10/22 2145

## 2022-06-10 NOTE — ED Triage Notes (Signed)
Patient reports chest tightness, abdominal cramping, vomiting, chills since last night. Denies fever. Moaning in pain, diaphoretic. Answering questions appropriately. Resp even, regular on room air.

## 2022-06-11 ENCOUNTER — Emergency Department
Admission: EM | Admit: 2022-06-11 | Discharge: 2022-06-11 | Disposition: A | Discharge status: Home / Self Care | Payer: Medicaid Other | Source: Home / Self Care | Attending: Student in an Organized Health Care Education/Training Program | Admitting: Student in an Organized Health Care Education/Training Program

## 2022-06-11 ENCOUNTER — Other Ambulatory Visit: Payer: Self-pay

## 2022-06-11 ENCOUNTER — Encounter: Payer: Self-pay | Admitting: Emergency Medicine

## 2022-06-11 ENCOUNTER — Emergency Department
Admission: EM | Admit: 2022-06-11 | Discharge: 2022-06-11 | Discharge status: Against Medical Advice | Payer: Medicaid Other | Attending: Emergency Medicine | Admitting: Emergency Medicine

## 2022-06-11 DIAGNOSIS — R112 Nausea with vomiting, unspecified: Secondary | ICD-10-CM | POA: Insufficient documentation

## 2022-06-11 LAB — URINALYSIS, ROUTINE W REFLEX MICROSCOPIC
Bilirubin Urine: NEGATIVE
Glucose, UA: NEGATIVE mg/dL
Hgb urine dipstick: NEGATIVE
Ketones, ur: 20 mg/dL — AB
Leukocytes,Ua: NEGATIVE
Nitrite: NEGATIVE
Protein, ur: 100 mg/dL — AB
Specific Gravity, Urine: 1.016 (ref 1.005–1.030)
pH: 5 (ref 5.0–8.0)

## 2022-06-11 LAB — COMPREHENSIVE METABOLIC PANEL
ALT: 21 U/L (ref 0–44)
AST: 39 U/L (ref 15–41)
Albumin: 4.8 g/dL (ref 3.5–5.0)
Alkaline Phosphatase: 83 U/L (ref 38–126)
Anion gap: 12 (ref 5–15)
BUN: 17 mg/dL (ref 6–20)
CO2: 22 mmol/L (ref 22–32)
Calcium: 9.9 mg/dL (ref 8.9–10.3)
Chloride: 105 mmol/L (ref 98–111)
Creatinine, Ser: 0.83 mg/dL (ref 0.61–1.24)
GFR, Estimated: >60 mL/min (ref >60)
Glucose, Bld: 179 mg/dL — ABNORMAL HIGH (ref 70–99)
Potassium: 3.8 mmol/L (ref 3.5–5.1)
Sodium: 139 mmol/L (ref 135–145)
Total Bilirubin: 0.7 mg/dL (ref 0.3–1.2)
Total Protein: 9 g/dL — ABNORMAL HIGH (ref 6.5–8.1)

## 2022-06-11 LAB — CBC
HCT: 44.4 % (ref 39.0–52.0)
Hemoglobin: 14.7 g/dL (ref 13.0–17.0)
MCH: 27.7 pg (ref 26.0–34.0)
MCHC: 33.1 g/dL (ref 30.0–36.0)
MCV: 83.8 fL (ref 80.0–100.0)
Platelets: 314 10*3/uL (ref 150–400)
RBC: 5.3 MIL/uL (ref 4.22–5.81)
RDW: 15.9 % — ABNORMAL HIGH (ref 11.5–15.5)
WBC: 10 10*3/uL (ref 4.0–10.5)
nRBC: 0 % (ref 0.0–0.2)

## 2022-06-11 LAB — URINE DRUG SCREEN, QUALITATIVE (ARMC ONLY)
Amphetamines, Ur Screen: NOT DETECTED
Barbiturates, Ur Screen: NOT DETECTED
Benzodiazepine, Ur Scrn: NOT DETECTED
Cannabinoid 50 Ng, Ur ~~LOC~~: POSITIVE — AB
Cocaine Metabolite,Ur ~~LOC~~: NOT DETECTED
MDMA (Ecstasy)Ur Screen: NOT DETECTED
Methadone Scn, Ur: NOT DETECTED
Opiate, Ur Screen: NOT DETECTED
Phencyclidine (PCP) Ur S: NOT DETECTED
Tricyclic, Ur Screen: POSITIVE — AB

## 2022-06-11 LAB — LIPASE, BLOOD: Lipase: 50 U/L (ref 11–51)

## 2022-06-11 MED ORDER — MORPHINE SULFATE (PF) 4 MG/ML IV SOLN
4.0000 mg | INTRAVENOUS | Status: DC | PRN
  Administered 2022-06-11: 4 mg via INTRAVENOUS
  Filled 2022-06-11: qty 1

## 2022-06-11 MED ORDER — SODIUM CHLORIDE 0.9 % IV SOLN
25.0000 mg | Freq: Four times a day (QID) | INTRAVENOUS | Status: DC | PRN
  Filled 2022-06-11: qty 1

## 2022-06-11 MED ORDER — PROMETHAZINE HCL 12.5 MG PO TABS: 25.0000 mg | ORAL_TABLET | Freq: Four times a day (QID) | ORAL | 0 refills | Status: AC | PRN

## 2022-06-11 MED ORDER — SODIUM CHLORIDE 0.9 % IV BOLUS
1000.0000 mL | Freq: Once | INTRAVENOUS | Status: AC
  Administered 2022-06-11: 1000 mL via INTRAVENOUS

## 2022-06-11 MED ORDER — METOCLOPRAMIDE HCL 5 MG/ML IJ SOLN
10.0000 mg | Freq: Once | INTRAMUSCULAR | Status: AC
  Administered 2022-06-11: 10 mg via INTRAVENOUS
  Filled 2022-06-11: qty 2

## 2022-06-11 MED ORDER — PANTOPRAZOLE SODIUM 40 MG IV SOLR
40.0000 mg | Freq: Once | INTRAVENOUS | Status: AC
  Administered 2022-06-11: 40 mg via INTRAVENOUS
  Filled 2022-06-11: qty 10

## 2022-06-11 NOTE — Discharge Instructions (Signed)

## 2022-06-11 NOTE — ED Provider Triage Note (Signed)
  Emergency Medicine Provider Triage Evaluation Note  Jeffrey Grant. , a 41 y.o.male,  was evaluated in triage.  Pt complains of nausea/vomiting and abdominal cramping.  He states this been going on for 2 days.  He was here last night, but left due to the wait time.  He states that he had a similar episode a few weeks ago, which his workup was ultimately negative and scans did not show any acute findings.  He was treated with ondansetron and promethazine, which controlled the symptoms.  Normally uses and has been doing so for the past few years.   Review of Systems  Positive: Nausea/vomiting, abdominal pain. Negative: Denies fever, chest pain, vomiting  Physical Exam   Vitals:   06/11/22 0851  BP: (!) 156/108  Pulse: (!) 131  Resp: (!) 22  Temp: 97.8 F (36.6 C)  SpO2: 100%   Gen:   Awake, diaphoretic, appears uncomfortable. Resp:  Normal effort  MSK:   Moves extremities without difficulty  Other:  No significant abdominal pain.  Medical Decision Making  Given the patient's initial medical screening exam, the following diagnostic evaluation has been ordered. The patient will be placed in the appropriate treatment space, once one is available, to complete the evaluation and treatment. I have discussed the plan of care with the patient and I have advised the patient that an ED physician or mid-level practitioner will reevaluate their condition after the test results have been received, as the results may give them additional insight into the type of treatment they may need.    Diagnostics: Labs, UA, EKG  Treatments: Promethazine, IV fluids   Varney Daily, [place] 06/11/22 [phone]

## 2022-06-11 NOTE — ED Triage Notes (Signed)
Pt states coming in due to vomiting and abdominal pain for 2 days. Pt states coming in last night, but left due to the wait time. Pt denies diarrhea.

## 2022-06-11 NOTE — ED Notes (Signed)
No answer when called several times from lobby 

## 2022-06-11 NOTE — ED Notes (Signed)
Provider states he is ok w vitals and to d/c

## 2022-06-11 NOTE — ED Provider Notes (Signed)
[facility] Provider Note    Event Date/Time   First MD Initiated Contact with Patient 06/11/22 1041     (approximate)   History   Emesis   HPI  Jeffrey Grant. is a 41 y.o. male with a history of PAD presents to the ER for evaluation of nausea vomiting as well as abdominal discomfort.  Feels similar symptoms to previous admission.  Has had some chills has had some difficulty urinating.  Denies any pain radiating through to his back.  During previous visit for similar symptoms workup including CT imaging was unremarkable.  Patient proved after IV fluids and symptomatic management.     Physical Exam   Triage Vital Signs: ED Triage Vitals  Enc Vitals Group     BP 06/11/22 0851 (!) 156/108     Pulse Rate 06/11/22 0851 (!) 131     Resp 06/11/22 0851 (!) 22     Temp 06/11/22 0851 97.8 F (36.6 C)     Temp Source 06/11/22 0851 Oral     SpO2 06/11/22 0851 100 %     Weight 06/11/22 0853 180 lb (81.6 kg)     Height 06/11/22 0853 5\' 9"  (1.753 m)     Head Circumference --      Peak Flow --      Pain Score 06/11/22 0852 7     Pain Loc --      Pain Edu? --      Excl. in [place]? --     Most recent vital signs: Vitals:   06/11/22 0851  BP: (!) 156/108  Pulse: (!) 131  Resp: (!) 22  Temp: 97.8 F (36.6 C)  SpO2: 100%     Constitutional: Alert  Eyes: Conjunctivae are normal.  Head: Atraumatic. Nose: No congestion/rhinnorhea. Mouth/Throat: Mucous membranes are moist.   Neck: Painless ROM.  Cardiovascular:   Good peripheral circulation. Respiratory: Normal respiratory effort.  No retractions.  Gastrointestinal: Soft and nontender.  Musculoskeletal:  no deformity Neurologic:  MAE spontaneously. No gross focal neurologic deficits are appreciated.  Skin:  Skin is warm, dry and intact. No rash noted. Psychiatric: Mood and affect are normal. Speech and behavior are normal.    ED Results / Procedures / Treatments   Labs (all labs ordered are  listed, but only abnormal results are displayed) Labs Reviewed  COMPREHENSIVE METABOLIC PANEL - Abnormal; Notable for the following components:      Result Value   Glucose, Bld 179 (*)    Total Protein 9.0 (*)    All other components within normal limits  CBC - Abnormal; Notable for the following components:   RDW 15.9 (*)    All other components within normal limits  URINALYSIS, ROUTINE W REFLEX MICROSCOPIC - Abnormal; Notable for the following components:   Color, Urine YELLOW (*)    APPearance CLEAR (*)    Ketones, ur 20 (*)    Protein, ur 100 (*)    Bacteria, UA RARE (*)    All other components within normal limits  URINE DRUG SCREEN, QUALITATIVE ([facility] ONLY) - Abnormal; Notable for the following components:   Tricyclic, Ur Screen POSITIVE (*)    Cannabinoid 50 Ng, Ur Vader POSITIVE (*)    All other components within normal limits  LIPASE, BLOOD     EKG  ED ECG REPORT I, 06/13/22, the attending physician, personally viewed and interpreted this ECG.   Date: 06/11/2022  EKG Time: 9:02  Rate: 120  Rhythm: sinus  Axis: normal  Intervals:normal  ST&T Change: no stemi, no depressions    RADIOLOGY    PROCEDURES:  Critical Care performed:   Procedures   MEDICATIONS ORDERED IN ED: Medications  morphine (PF) 4 MG/ML injection 4 mg (4 mg Intravenous Given 06/11/22 1114)  sodium chloride 0.9 % bolus 1,000 mL (0 mLs Intravenous Stopped 06/11/22 1115)  metoCLOPramide (REGLAN) injection 10 mg (10 mg Intravenous Given 06/11/22 1114)  pantoprazole (PROTONIX) injection 40 mg (40 mg Intravenous Given 06/11/22 1114)  sodium chloride 0.9 % bolus 1,000 mL (1,000 mLs Intravenous New Bag/Given 06/11/22 1114)     IMPRESSION / MDM / ASSESSMENT AND PLAN / ED COURSE  I reviewed the triage vital signs and the nursing notes.                              Differential diagnosis includes, but is not limited to, enteritis, gastritis, pancreatitis, biliary pathology, cyclic  vomiting syndrome, mesenteric ischemia  Patient presenting to the ER for evaluation of symptoms as described above.  Based on symptoms, risk factors and considered above differential, this presenting complaint could reflect a potentially life-threatening illness therefore the patient will be placed on continuous pulse oximetry and telemetry for monitoring.  Laboratory evaluation will be sent to evaluate for the above complaints.  Will give IV fluids as well as symptomatic management.  His abdominal exam is soft and benign at this point.  Will observe and reassess to determine need for additional imaging.   Clinical Course as of 06/11/22 1216  Tue Jun 11, 2022  1204 Patient reassessed.  Repeat exam is reassuring.  No abdominal pain.  Given improved blood work with similar presentation this past month with normal CT imaging I do not feel that repeat CT imaging clinically indicated at this time.  Patient tolerating p.o. discussed the result of his urinalysis and UDS and possibility of the symptoms could be related to hyperemesis cannabinoid syndrome.  Patient demonstrates understanding.  We discussed return precautions. [PR]    Clinical Course User Index [PR] [name], MD    FINAL CLINICAL IMPRESSION(S) / ED DIAGNOSES   Final diagnoses:  Nausea and vomiting, unspecified vomiting type     Rx / DC Orders   ED Discharge Orders          Ordered    promethazine (PHENERGAN) 12.5 MG tablet  Every 6 hours PRN        06/11/22 1206             Note:  This document was prepared using Dragon voice recognition software and may include unintentional dictation errors.    [name], MD 06/11/22 [phone]

## 2022-06-25 ENCOUNTER — Other Ambulatory Visit (INDEPENDENT_AMBULATORY_CARE_PROVIDER_SITE_OTHER): Payer: Self-pay | Admitting: Vascular Surgery

## 2022-06-25 DIAGNOSIS — Z9889 Other specified postprocedural states: Secondary | ICD-10-CM

## 2022-06-28 ENCOUNTER — Ambulatory Visit (INDEPENDENT_AMBULATORY_CARE_PROVIDER_SITE_OTHER): Payer: Medicaid Other | Admitting: Nurse Practitioner

## 2022-06-28 ENCOUNTER — Encounter (INDEPENDENT_AMBULATORY_CARE_PROVIDER_SITE_OTHER): Payer: Medicaid Other

## 2022-07-11 ENCOUNTER — Encounter (INDEPENDENT_AMBULATORY_CARE_PROVIDER_SITE_OTHER): Payer: Self-pay | Admitting: Nurse Practitioner

## 2022-07-11 ENCOUNTER — Ambulatory Visit (INDEPENDENT_AMBULATORY_CARE_PROVIDER_SITE_OTHER): Payer: Medicaid Other

## 2022-07-11 ENCOUNTER — Ambulatory Visit (INDEPENDENT_AMBULATORY_CARE_PROVIDER_SITE_OTHER): Payer: Medicaid Other | Admitting: Nurse Practitioner

## 2022-07-11 VITALS — BP 105/74 | HR 105 | Ht 69.0 in | Wt 180.0 lb

## 2022-07-11 DIAGNOSIS — I739 Peripheral vascular disease, unspecified: Secondary | ICD-10-CM | POA: Diagnosis not present

## 2022-07-11 DIAGNOSIS — E782 Mixed hyperlipidemia: Secondary | ICD-10-CM

## 2022-07-11 DIAGNOSIS — I10 Essential (primary) hypertension: Secondary | ICD-10-CM | POA: Diagnosis not present

## 2022-07-11 DIAGNOSIS — Z9889 Other specified postprocedural states: Secondary | ICD-10-CM

## 2022-07-15 ENCOUNTER — Emergency Department: Payer: Medicaid Other

## 2022-07-15 ENCOUNTER — Other Ambulatory Visit: Payer: Self-pay

## 2022-07-15 ENCOUNTER — Emergency Department
Admission: EM | Admit: 2022-07-15 | Discharge: 2022-07-15 | Disposition: A | Discharge status: Home / Self Care | Payer: Medicaid Other | Attending: Emergency Medicine | Admitting: Emergency Medicine

## 2022-07-15 DIAGNOSIS — R0789 Other chest pain: Secondary | ICD-10-CM | POA: Diagnosis not present

## 2022-07-15 DIAGNOSIS — R002 Palpitations: Secondary | ICD-10-CM | POA: Diagnosis not present

## 2022-07-15 LAB — CBC
HCT: 37.6 % — ABNORMAL LOW (ref 39.0–52.0)
Hemoglobin: 12.1 g/dL — ABNORMAL LOW (ref 13.0–17.0)
MCH: 28.3 pg (ref 26.0–34.0)
MCHC: 32.2 g/dL (ref 30.0–36.0)
MCV: 87.9 fL (ref 80.0–100.0)
Platelets: 256 10*3/uL (ref 150–400)
RBC: 4.28 MIL/uL (ref 4.22–5.81)
RDW: 17.2 % — ABNORMAL HIGH (ref 11.5–15.5)
WBC: 9.9 10*3/uL (ref 4.0–10.5)
nRBC: 0 % (ref 0.0–0.2)

## 2022-07-15 LAB — BASIC METABOLIC PANEL
Anion gap: 8 (ref 5–15)
BUN: 13 mg/dL (ref 6–20)
CO2: 25 mmol/L (ref 22–32)
Calcium: 8.9 mg/dL (ref 8.9–10.3)
Chloride: 103 mmol/L (ref 98–111)
Creatinine, Ser: 0.77 mg/dL (ref 0.61–1.24)
GFR, Estimated: >60 mL/min (ref >60)
Glucose, Bld: 165 mg/dL — ABNORMAL HIGH (ref 70–99)
Potassium: 4 mmol/L (ref 3.5–5.1)
Sodium: 136 mmol/L (ref 135–145)

## 2022-07-15 LAB — TROPONIN I (HIGH SENSITIVITY)
Troponin I (High Sensitivity): 5 ng/L (ref <18)
Troponin I (High Sensitivity): 5 ng/L (ref <18)

## 2022-07-15 MED ORDER — METOPROLOL SUCCINATE ER 25 MG PO TB24: ORAL_TABLET | ORAL | 0 refills | Status: DC

## 2022-07-15 MED ORDER — METOPROLOL TARTRATE 25 MG PO TABS
25.0000 mg | ORAL_TABLET | Freq: Once | ORAL | Status: AC
  Administered 2022-07-15: 25 mg via ORAL
  Filled 2022-07-15: qty 1

## 2022-07-15 NOTE — ED Provider Notes (Signed)
[facility] Provider Note    Event Date/Time   First MD Initiated Contact with Patient 07/15/22 [id number]     (approximate)   History   Chest Pain   HPI  Jeffrey Steinborn. is a 42 y.o. male  Here with chest pain.  Patient has an extensive cardiac history including 2 bypasses, peripheral arterial disease.  He states that intermittently for the last several months, he has had episodes in which she typically wakes up at night, sweating, with some shortness of breath.  He often feels anxious during his episodes.  He also feels like his heart is beating quickly.  He was seen recently for this and discharged.  He has no outpatient appointment with cardiology in several weeks.  Symptoms were slightly worse today than they have been.  Denies any chest pain with exertion.  No shortness of breath with exertion.  No leg swelling.  No recent medication changes.     Physical Exam   Triage Vital Signs: ED Triage Vitals  Enc Vitals Group     BP 07/15/22 0721 (!) 133/92     Pulse Rate 07/15/22 0721 98     Resp 07/15/22 0721 18     Temp 07/15/22 0721 98.3 F (36.8 C)     Temp Source 07/15/22 0721 Oral     SpO2 07/15/22 0721 99 %     Weight 07/15/22 0756 179 lb 14.3 oz (81.6 kg)     Height 07/15/22 0756 5\' 9"  (1.753 m)     Head Circumference --      Peak Flow --      Pain Score 07/15/22 0721 7     Pain Loc --      Pain Edu? --      Excl. in [place]? --     Most recent vital signs: Vitals:   07/15/22 0721 07/15/22 1114  BP: (!) 133/92 130/88  Pulse: 98 90  Resp: 18 18  Temp: 98.3 F (36.8 C) 98 F (36.7 C)  SpO2: 99% 99%     General: Awake, no distress.  CV:  Good peripheral perfusion.  Regular rate and rhythm.  No murmurs. Resp:  Normal effort.  Lungs clear to auscultation bilaterally. Abd:  No distention.  Other:  No asymmetric edema.  Pulses 2+ and symmetric.  Cranial nerves II through XII intact.  Strength out of 5 bilateral upper and lower extremities.   Normal sensation to light touch.   ED Results / Procedures / Treatments   Labs (all labs ordered are listed, but only abnormal results are displayed) Labs Reviewed  BASIC METABOLIC PANEL - Abnormal; Notable for the following components:      Result Value   Glucose, Bld 165 (*)    All other components within normal limits  CBC - Abnormal; Notable for the following components:   Hemoglobin 12.1 (*)    HCT 37.6 (*)    RDW 17.2 (*)    All other components within normal limits  TROPONIN I (HIGH SENSITIVITY)  TROPONIN I (HIGH SENSITIVITY)     EKG Normal sinus rhythm, ventricular rate 95.  PR 176, QRS 78, QTc 422.  No acute ST elevations or depressions.   RADIOLOGY Chest x-ray: No active disease   I also independently reviewed and agree with radiologist interpretations.   PROCEDURES:  Critical Care performed: No  .1-3 Lead EKG Interpretation  Performed by: Duffy Bruce, MD Authorized by: Duffy Bruce, MD     Interpretation: non-specific  ECG rate:  90-110   ECG rate assessment: tachycardic     Rhythm: sinus rhythm     Ectopy: none     Conduction: normal   Comments:     Indication: Chest pain     MEDICATIONS ORDERED IN ED: Medications  metoprolol tartrate (LOPRESSOR) tablet 25 mg (25 mg Oral Given 07/15/22 0955)     IMPRESSION / MDM / ASSESSMENT AND PLAN / ED COURSE  I reviewed the triage vital signs and the nursing notes.                              Differential diagnosis includes, but is not limited to, symptomatic atrial fibrillation, ACS, angina, anxiety, symptomatic PVCs/palpitations, pneumothorax, pneumonia  Patient's presentation is most consistent with acute presentation with potential threat to life or bodily function.  The patient is on the cardiac monitor to evaluate for evidence of arrhythmia and/or significant heart rate changes.  42 year old male with extensive past medical history including coronary disease status post CABG, remote  history of atrial fibrillation, peripheral arterial disease, here with intermittent episodes of waking up with palpitations, sweating, and chest pain.  Patient has been seen multiple times for this.  Interestingly, he has began checking his vitals and his heart rate has been as high as the 120s to 130s during these episodes.  He has been progressively increasing at baseline.  He has a history of A-fib but is normally in sinus rhythm.  Suspect patient may be having episodes of intermittent A-fib triggering his symptoms, versus an anxiety component triggering panic attacks and subsequent A-fib.  EKG is nonischemic and troponins are negative x 2.  No signs of ischemia.  Chest x-ray is clear.  Patient given p.o. metoprolol with some symptomatic improvement here.  Lab work otherwise reassuring.  Electrolytes within normal limits.  No leukocytosis noted.  Troponin is negative as mentioned.  Patient did have complaint of headache as well on blood thinners, CT obtained and reviewed.  No evidence of acute intracranial abnormality.  Given reassuring workup and vitals here, will have the patient follow-up as an outpatient with cardiology.  Will start a low-dose metoprolol here.  Feel this is reasonable given his hypertension, heart rate, and history of A-fib.  He is already on anticoagulation.  Will discharge with outpatient follow-up and good return precautions.   FINAL CLINICAL IMPRESSION(S) / ED DIAGNOSES   Final diagnoses:  Atypical chest pain  Palpitations     Rx / DC Orders   ED Discharge Orders          Ordered    metoprolol succinate (TOPROL XL) 25 MG 24 hr tablet        07/15/22 1118             Note:  This document was prepared using Dragon voice recognition software and may include unintentional dictation errors.   Duffy Bruce, MD 07/15/22 [phone]

## 2022-07-15 NOTE — ED Notes (Signed)
REPEAT EKG given to Ellender Hose, MD.

## 2022-07-15 NOTE — Discharge Instructions (Signed)
Start the metoprolol 1 tablet daily.  Keep track of your heart rate and blood pressure.  If your heart rate remains over 80 consistently and blood pressure no less than 1 20-1 30, you can increase this to 2 tablets daily.  Follow-up with your primary care doctor this week, as well as your cardiologist within the next 1 to 2 weeks.

## 2022-07-15 NOTE — ED Triage Notes (Addendum)
Pt to ED via [facility] from home. Pt reports cold sweats and chest discomfort that started this morning and has been intermittent x3 wks. Pt reports last time he felt this way he was seen and told he was dehydrated.  Pt has had 2 bypasses most recent last year.   Ems VS: 129/77 108 99% RA 97.4 oral

## 2022-07-15 NOTE — ED Notes (Signed)
See triage note  Presents with cold sweats which started this am  has had similar episodes in the past  Also had some chest pain  states he has been seen for same recently

## 2022-07-16 LAB — VAS US ABI WITH/WO TBI
Left ABI: 1.04
Right ABI: 1.05

## 2022-07-28 ENCOUNTER — Encounter (INDEPENDENT_AMBULATORY_CARE_PROVIDER_SITE_OTHER): Payer: Self-pay | Admitting: Nurse Practitioner

## 2022-07-28 NOTE — Progress Notes (Signed)
Subjective:    Patient ID: Jeffrey Georges., male    DOB: 07-23-1980, 42 y.o.   MRN: [id number] Chief Complaint  Patient presents with   Follow-up    f/u in 3 months with abi/aoiliac    Jeffrey Grant is a 42 year old male who returns today for staple removal of his right groin.  The patient has had multiple vascular procedures recently.  He originally underwent angiogram on 02/12/2022 which acutely thrombosed immediately postprocedure.  That same day he underwent surgery including following:   PROCEDURE: 1.         Right common femoral and superficial femoral endarterectomy with Cormatrix patch angioplasty. 2.         Thromboembolectomy of the right limb aortobifemoral bypass graft with a #3 Fogarty. 3.         Redo right groin surgery for exposure of the femoral arteries.   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  Since that time the patient has done well.  He notes that his foot drop has much improved.  He also notes that he is ready to resume more physical activity.  Today's noninvasive studies show an of 1.05 on the right and 1.04 on the left.  He has a TBI 0.82 on the right and 0.85 on the left.  These are improved from his previous studies.  He has primarily triphasic waveforms in the right tibial vessels with biphasic/triphasic in the left.  The toe waveforms are normal bilaterally.     Review of Systems  Cardiovascular:  Positive for leg swelling.  All other systems reviewed and are negative.      Objective:   Physical Exam Vitals reviewed.  HENT:     Head: Normocephalic.  Cardiovascular:     Rate and Rhythm: Normal rate.     Pulses:          Dorsalis pedis pulses are 1+ on the right side and 1+ on the left side.  Pulmonary:     Effort: Pulmonary effort is normal.  Skin:    General: Skin is warm and dry.  Neurological:     Mental Status: He is alert and  oriented to person, place, and time.  Psychiatric:        Mood and Affect: Mood normal.        Behavior: Behavior normal.        Thought Content: Thought content normal.        Judgment: Judgment normal.     BP 105/74   Pulse (!) 105   Ht 5\' 9"  (1.753 m)   Wt 180 lb (81.6 kg)   BMI 26.58 kg/m   Past Medical History:  Diagnosis Date   Anxiety    Atherosclerotic PVD with intermittent claudication (HCC)    Back pain    Collagen vascular disease (HCC)    Dyspnea    Dysrhythmia    Elevated lipids    Hypertension    Nausea    Seropositive rheumatoid arthritis ([place])     Social History   Socioeconomic History   Marital status: Married    Spouse name: Not on file   Number of children: Not on file   Years of education: Not on file   Highest education level: Not on file  Occupational History   Not on file  Tobacco Use   Smoking status: Former    Packs/day: 0.25    Years:  16.00    Total pack years: 4.00    Types: Cigarettes    Quit date: 06/04/2021    Years since quitting: 1.1   Smokeless tobacco: Never  Vaping Use   Vaping Use: Never used  Substance and Sexual Activity   Alcohol use: No   Drug use: Not Currently   Sexual activity: Yes    Partners: Female  Other Topics Concern   Not on file  Social History Narrative   Not on file   Social Determinants of Health   Financial Resource Strain: Not on file  Food Insecurity: Not on file  Transportation Needs: Not on file  Physical Activity: Not on file  Stress: Not on file  Social Connections: Not on file  Intimate Partner Violence: Not on file    Past Surgical History:  Procedure Laterality Date   ABDOMINAL AORTOGRAM W/LOWER EXTREMITY N/A 02/26/2022   Procedure: ABDOMINAL AORTOGRAM W/LOWER EXTREMITY;  Surgeon: Katha Cabal, MD;  Location: [facility];  Service: Cardiovascular;  Laterality: N/A;   AORTA - BILATERAL FEMORAL ARTERY BYPASS GRAFT Bilateral 06/01/2021   Procedure: AORTA BIFEMORAL  BYPASS GRAFT;  Surgeon: Katha Cabal, MD;  Location: [facility];  Service: Vascular;  Laterality: Bilateral;   APPLICATION OF WOUND VAC Right 02/12/2022   Procedure: APPLICATION OF WOUND VAC;  Surgeon: Katha Cabal, MD;  Location: [facility];  Service: Vascular;  Laterality: Right;   COLONOSCOPY WITH PROPOFOL N/A 04/21/2018   Procedure: COLONOSCOPY WITH PROPOFOL;  Surgeon: Jonathon Bellows, MD;  Location: [facility];  Service: Gastroenterology;  Laterality: N/A;   EMBOLECTOMY Right 02/12/2022   Procedure: EMBOLECTOMY;  Surgeon: Katha Cabal, MD;  Location: [facility];  Service: Vascular;  Laterality: Right;   ENDARTERECTOMY FEMORAL Right 02/12/2022   Procedure: ENDARTERECTOMY FEMORAL;  Surgeon: Katha Cabal, MD;  Location: [facility];  Service: Vascular;  Laterality: Right;   ENDARTERECTOMY POPLITEAL Right 06/02/2021   Procedure: Right Femoral Endarterectomy and Patching Endoplasty;  Surgeon: Serafina Mitchell, MD;  Location: [facility];  Service: Vascular;  Laterality: Right;   ESOPHAGOGASTRODUODENOSCOPY (EGD) WITH PROPOFOL N/A 04/21/2018   Procedure: ESOPHAGOGASTRODUODENOSCOPY (EGD) WITH PROPOFOL;  Surgeon: Jonathon Bellows, MD;  Location[facility];  Service: Gastroenterology;  Laterality: N/A;   HIP PINNING Right    LOWER EXTREMITY ANGIOGRAPHY N/A 05/29/2021   Procedure: LOWER EXTREMITY ANGIOGRAPHY;  Surgeon: Katha Cabal, MD;  Location: [facility];  Service: Cardiovascular;  Laterality: N/A;   LOWER EXTREMITY ANGIOGRAPHY Left 07/24/2021   Procedure: LOWER EXTREMITY ANGIOGRAPHY;  Surgeon: Katha Cabal, MD;  Location: [facility] [facility];  Service: Cardiovascular;  Laterality: Left;   LOWER EXTREMITY ANGIOGRAPHY Right 08/07/2021   Procedure: Lower Extremity Angiography;  Surgeon: Katha Cabal, MD;  Location: [facility];  Service: Cardiovascular;  Laterality: Right;   LOWER EXTREMITY ANGIOGRAPHY Left 02/12/2022   Procedure: Lower Extremity  Angiography;  Surgeon: Katha Cabal, MD;  Location: [facility];  Service: Cardiovascular;  Laterality: Left;   TOTAL HIP ARTHROPLASTY Right 10/22/2016   Procedure: TOTAL HIP ARTHROPLASTY ANTERIOR APPROACH;  Surgeon: Hessie Knows, MD;  Location: [facility];  Service: Orthopedics;  Laterality: Right;    History reviewed. No pertinent family history.  No Known Allergies     Latest Ref Rng & Units 07/15/2022    7:31 AM 06/11/2022    8:54 AM 06/10/2022    9:13 PM  CBC  WBC 4.0 - 10.5 K/uL 9.9  10.0  15.5  Hemoglobin 13.0 - 17.0 g/dL 29.9  37.1  69.6   Hematocrit 39.0 - 52.0 % 37.6  44.4  42.4   Platelets 150 - 400 K/uL 256  314  338       CMP     Component Value Date/Time   NA 136 07/15/2022 0731   NA 140 03/30/2018 1404   K 4.0 07/15/2022 0731   CL 103 07/15/2022 0731   CO2 25 07/15/2022 0731   GLUCOSE 165 (H) 07/15/2022 0731   BUN 13 07/15/2022 0731   BUN 9 03/30/2018 1404   CREATININE 0.77 07/15/2022 0731   CALCIUM 8.9 07/15/2022 0731   PROT 9.0 (H) 06/11/2022 0854   PROT 7.3 03/30/2018 1404   ALBUMIN 4.8 06/11/2022 0854   ALBUMIN 4.6 03/30/2018 1404   AST 39 06/11/2022 0854   ALT 21 06/11/2022 0854   ALKPHOS 83 06/11/2022 0854   BILITOT 0.7 06/11/2022 0854   BILITOT 0.3 03/30/2018 1404   GFRNONAA >60 07/15/2022 0731   GFRAA 129 03/30/2018 1404     VAS [place] ABI WITH/WO TBI  Result Date: 07/16/2022  LOWER EXTREMITY DOPPLER STUDY Patient Name:  Jeffrey Grant  Date of Exam:   07/11/2022 Medical Rec #: [id number]      Accession #:    [id number] Date of Birth: [birth year]      Patient Gender: M Patient Age:   36 years Exam Location:  [facility] Procedure:      VAS [place] ABI WITH/WO TBI Referring Phys: --------------------------------------------------------------------------------  Indications: Claudication, and peripheral artery disease. High Risk Factors: Hypertension.  Vascular Interventions: 06/01/21: Aortobifemoral BPG with bilateral CFA/SFA                          endarterectomies;                         06/02/21: Right SFA endarterectomy/angioplasty;                          07/24/2021: Left Lower Extremity Angiogram Third order                         catheter placement. Angioplasty and Stent placement Left                         SFA and Popliteal Artery.                          08/07/2021: PTA and Stent placement Right SFA.                         02/26/2022 right fem pop bypass. Comparison Study: 03/2022 Performing Technologist: Salvadore Farber RVT  Examination Guidelines: A complete evaluation includes at minimum, Doppler waveform signals and systolic blood pressure reading at the level of bilateral brachial, anterior tibial, and posterior tibial arteries, when vessel segments are accessible. Bilateral testing is considered an integral part of a complete examination. Photoelectric Plethysmograph (PPG) waveforms and toe systolic pressure readings are included as required and additional duplex testing as needed. Limited examinations for reoccurring indications may be performed as noted.  ABI Findings: +---------+------------------+-----+---------+--------+ Right    Rt Pressure (mmHg)IndexWaveform Comment  +---------+------------------+-----+---------+--------+ Brachial 117                                      +---------+------------------+-----+---------+--------+  ATA      125               1.04 triphasic         +---------+------------------+-----+---------+--------+ PTA      126               1.05 triphasic         +---------+------------------+-----+---------+--------+ Great Toe98                0.82 Normal            +---------+------------------+-----+---------+--------+ +---------+------------------+-----+---------+-------+ Left     Lt Pressure (mmHg)IndexWaveform Comment +---------+------------------+-----+---------+-------+ Brachial 120                                      +---------+------------------+-----+---------+-------+ ATA      125               1.04 biphasic         +---------+------------------+-----+---------+-------+ PTA      117               0.98 triphasic        +---------+------------------+-----+---------+-------+ Great Toe102               0.85 Normal           +---------+------------------+-----+---------+-------+ +-------+-----------+-----------+------------+------------+ ABI/TBIToday's ABIToday's TBIPrevious ABIPrevious TBI +-------+-----------+-----------+------------+------------+ Right  1.05       .82        .98         .54          +-------+-----------+-----------+------------+------------+ Left   1.04       .85        .95         .73          +-------+-----------+-----------+------------+------------+ Bilateral ABIs and TBIs appear increased compared to prior study on 03/2022.  Summary: Right: Resting right ankle-brachial index is within normal range. The right toe-brachial index is normal. Left: Resting left ankle-brachial index is within normal range. The left toe-brachial index is normal. *See table(s) above for measurements and observations.  Electronically signed by [name] MD on 07/16/2022 at 7:23:43 AM.    Final        Assessment & Plan:   1. Peripheral arterial disease with history of revascularization (HCC)  Recommend:  The patient has evidence of atherosclerosis of the lower extremities with no claudication.  The patient does not voice lifestyle limiting changes at this point in time.  Noninvasive studies do not suggest clinically significant change.  No invasive studies, angiography or surgery at this time The patient should continue walking and begin a more formal exercise program.  The patient should continue antiplatelet therapy and aggressive treatment of the lipid abnormalities  No changes in the patient's medications at this time  Continued surveillance is indicated as  atherosclerosis is likely to progress with time.    The patient will continue follow up with noninvasive studies as ordered.   2. Mixed hyperlipidemia Continue statin as ordered and reviewed, no changes at this time  3. Benign essential hypertension Continue antihypertensive medications as already ordered, these medications have been reviewed and there are no changes at this time.   Current Outpatient Medications on File Prior to Visit  Medication Sig Dispense Refill   acetaminophen (TYLENOL) 500 MG tablet Take 1,000 mg by mouth every 6 (six) hours as needed (pain.).     amLODipine (  NORVASC) 10 MG tablet Take 0.5 tablets (5 mg total) by mouth in the morning. (Patient taking differently: Take 10 mg by mouth in the morning.) 90 tablet 0   aspirin EC 81 MG EC tablet Take 1 tablet (81 mg total) by mouth daily. Swallow whole. 30 tablet 11   atorvastatin (LIPITOR) 80 MG tablet Take 1 tablet (80 mg total) by mouth daily. 90 tablet 1   cilostazol (PLETAL) 100 MG tablet Take 1 tablet (100 mg total) by mouth 2 (two) times daily. 60 tablet 4   clopidogrel (PLAVIX) 75 MG tablet Take 1 tablet (75 mg total) by mouth daily. 90 tablet 1   DULoxetine (CYMBALTA) 30 MG capsule Take 90 mg by mouth daily.     ferrous sulfate 325 (65 FE) MG tablet Take 1 tablet (325 mg total) by mouth 2 (two) times daily with a meal. 30 tablet 3   folic acid (FOLVITE) 400 MCG tablet Take 400 mcg by mouth daily.     hydroxychloroquine (PLAQUENIL) 200 MG tablet Take 200 mg by mouth 2 (two) times daily.     lisinopril (ZESTRIL) 20 MG tablet Take 20 mg by mouth daily.     LYRICA 100 MG capsule Take 100 mg by mouth 2 (two) times daily.     oxyCODONE-acetaminophen (PERCOCET/ROXICET) 5-325 MG tablet Take 1-2 tablets by mouth every 4 (four) hours as needed for moderate pain. 30 tablet 0   pantoprazole (PROTONIX) 40 MG tablet Take 1 tablet (40 mg total) by mouth daily. 30 tablet 0   promethazine (PHENERGAN) 12.5 MG tablet Take 2 tablets  (25 mg total) by mouth every 6 (six) hours as needed for nausea or vomiting. 12 tablet 0   No current facility-administered medications on file prior to visit.    There are no Patient Instructions on file for this visit. No follow-ups on file.   Georgiana Spinner, NP

## 2022-07-29 ENCOUNTER — Encounter: Payer: Self-pay | Admitting: Cardiology

## 2022-07-29 ENCOUNTER — Encounter (INDEPENDENT_AMBULATORY_CARE_PROVIDER_SITE_OTHER): Payer: Self-pay

## 2022-08-01 ENCOUNTER — Ambulatory Visit (INDEPENDENT_AMBULATORY_CARE_PROVIDER_SITE_OTHER): Payer: Medicaid Other

## 2022-08-01 ENCOUNTER — Encounter: Payer: Self-pay | Admitting: Cardiology

## 2022-08-01 ENCOUNTER — Ambulatory Visit: Payer: Medicaid Other | Attending: Cardiology | Admitting: Cardiology

## 2022-08-01 ENCOUNTER — Other Ambulatory Visit
Admission: RE | Admit: 2022-08-01 | Discharge: 2022-08-01 | Disposition: A | Discharge status: Home / Self Care | Payer: Medicaid Other | Attending: Cardiology | Admitting: Cardiology

## 2022-08-01 VITALS — BP 122/80 | HR 87 | Ht 67.0 in | Wt 189.6 lb

## 2022-08-01 DIAGNOSIS — I498 Other specified cardiac arrhythmias: Secondary | ICD-10-CM

## 2022-08-01 DIAGNOSIS — R0609 Other forms of dyspnea: Secondary | ICD-10-CM | POA: Diagnosis not present

## 2022-08-01 DIAGNOSIS — I739 Peripheral vascular disease, unspecified: Secondary | ICD-10-CM | POA: Diagnosis present

## 2022-08-01 DIAGNOSIS — I10 Essential (primary) hypertension: Secondary | ICD-10-CM | POA: Insufficient documentation

## 2022-08-01 DIAGNOSIS — I2089 Other forms of angina pectoris: Secondary | ICD-10-CM | POA: Insufficient documentation

## 2022-08-01 LAB — BASIC METABOLIC PANEL
Anion gap: 9 (ref 5–15)
BUN: 18 mg/dL (ref 6–20)
CO2: 25 mmol/L (ref 22–32)
Calcium: 9.1 mg/dL (ref 8.9–10.3)
Chloride: 105 mmol/L (ref 98–111)
Creatinine, Ser: 0.98 mg/dL (ref 0.61–1.24)
GFR, Estimated: >60 mL/min (ref >60)
Glucose, Bld: 129 mg/dL — ABNORMAL HIGH (ref 70–99)
Potassium: 4.3 mmol/L (ref 3.5–5.1)
Sodium: 139 mmol/L (ref 135–145)

## 2022-08-01 MED ORDER — IVABRADINE HCL 5 MG PO TABS: 15.0000 mg | ORAL_TABLET | Freq: Once | ORAL | 0 refills | Status: AC

## 2022-08-01 MED ORDER — METOPROLOL SUCCINATE ER 50 MG PO TB24: 50.0000 mg | ORAL_TABLET | Freq: Two times a day (BID) | ORAL | 3 refills | Status: AC

## 2022-08-01 NOTE — Patient Instructions (Signed)
Medication Instructions:   STOP AMLODIPINE INCREASE METOPROLOL - take one tablet (50 mg) by mouth twice a day.   *If you need a refill on your cardiac medications before your next appointment, please call your pharmacy*   Lab Work:  Your physician recommends you go to the medical mall for labs  If you have labs (blood work) drawn today and your tests are completely normal, you will receive your results only by: MyChart Message (if you have MyChart) OR A paper copy in the mail If you have any lab test that is abnormal or we need to change your treatment, we will call you to review the results.   Testing/Procedures:  Your physician has recommended that you wear a Zio monitor.   This monitor is a medical device that records the heart's electrical activity. Doctors most often use these monitors to diagnose arrhythmias. Arrhythmias are problems with the speed or rhythm of the heartbeat. The monitor is a small device applied to your chest. You can wear one while you do your normal daily activities. While wearing this monitor if you have any symptoms to push the button and record what you felt. Once you have worn this monitor for the period of time provider prescribed (Usually 14 days), you will return the monitor device in the postage paid box. Once it is returned they will download the data collected and provide [place] with a report which the provider will then review and we will call you with those results. Important tips:  Avoid showering during the first 24 hours of wearing the monitor. Avoid excessive sweating to help maximize wear time. Do not submerge the device, no hot tubs, and no swimming pools. Keep any lotions or oils away from the patch. After 24 hours you may shower with the patch on. Take brief showers with your back facing the shower head.  Do not remove patch once it has been placed because that will interrupt data and decrease adhesive wear time. Push the button when you have  any symptoms and write down what you were feeling. Once you have completed wearing your monitor, remove and place into box which has postage paid and place in your outgoing mailbox.  If for some reason you have misplaced your box then call our office and we can provide another box and/or mail it off for you.     Your cardiac CT will be scheduled   [facility] [address] [place], [place] [address] [phone]  If scheduled at [facility] or [facility], please arrive 15 mins early for check-in and test prep.   Please follow these instructions carefully (unless otherwise directed):  Hold all erectile dysfunction medications at least 3 days (72 hrs) prior to test. (Ie viagra, cialis, sildenafil, tadalafil, etc) We will administer nitroglycerin during this exam.   On the Night Before the Test: Be sure to Drink plenty of water. Do not consume any caffeinated/decaffeinated beverages or chocolate 12 hours prior to your test. Do not take any antihistamines 12 hours prior to your test.  On the Day of the Test: Drink plenty of water until 1 hour prior to the test. Do not eat any food 1 hour prior to test. You may take your regular medications prior to the test.  Take corlanor two hours prior to test.      After the Test: Drink plenty of water. After receiving IV contrast, you may experience a mild flushed  feeling. This is normal. On occasion, you may experience a mild rash up to 24 hours after the test. This is not dangerous. If this occurs, you can take Benadryl 25 mg and increase your fluid intake. If you experience trouble breathing, this can be serious. If it is severe call 911 IMMEDIATELY. If it is mild, please call our office.   Follow-Up: At [facility], you and your health needs are our priority.  As part of our continuing mission to provide you with exceptional heart care,  we have created designated Provider Care Teams.  These Care Teams include your primary Cardiologist (physician) and Advanced Practice Providers (APPs -  Physician Assistants and Nurse Practitioners) who all work together to provide you with the care you need, when you need it.  We recommend signing up for the patient portal called "MyChart".  Sign up information is provided on this After Visit Summary.  MyChart is used to connect with patients for Virtual Visits (Telemedicine).  Patients are able to view lab/test results, encounter notes, upcoming appointments, etc.  Non-urgent messages can be sent to your provider as well.   To learn more about what you can do with MyChart, go to [email or web address].    Your next appointment:   6 - 8    Provider:   You may see Kate Sable, MD or one of the following Advanced Practice Providers on your designated Care Team:   Murray Hodgkins, NP Christell Faith, PA-C Cadence Kathlen Mody, PA-C Gerrie Nordmann, NP

## 2022-08-01 NOTE — Progress Notes (Signed)
Cardiology Office Note:    Date:  08/01/2022   ID:  Jeffrey Grant., DOB [birth year], MRN [id number]  PCP:  Marguerita Merles, MD   [place] Providers Cardiologist:  Kate Sable, MD     Referring MD: Marguerita Merles, MD   Chief Complaint  Patient presents with   New Patient (Initial Visit)    HTN, diaphoresis, cardiac Hx,     History of Present Illness:    Jeffrey Grant. is a 42 y.o. male with a hx of hypertension, former smoker x 20+ years, PAD s/p aortobifemoral bypass (05/2021) who presents with diaphoresis, shortness of breath.  Patient has been dealing with symptoms of diaphoresis usually at night when he sleeps.  Has also noticed some shortness of breath with exertion.  Denies chest pain.  After his leg surgery, heart rates were noted to be elevated around 120 bpm.  Started on Toprol-XL with improvement in heart rates.  His diaphoresis also improved with Toprol-XL.  Sister had a heart attack in her 65s.  Echo 05/2021 EF 60 to 65% Lexiscan Myoview 05/2021 no ischemia.  Normal perfusion.  Past Medical History:  Diagnosis Date   Anxiety    Atherosclerotic PVD with intermittent claudication (HCC)    Back pain    Collagen vascular disease (HCC)    Dyspnea    Dysrhythmia    Elevated lipids    Hypertension    Nausea    Seropositive rheumatoid arthritis ([place])     Past Surgical History:  Procedure Laterality Date   ABDOMINAL AORTOGRAM W/LOWER EXTREMITY N/A 02/26/2022   Procedure: ABDOMINAL AORTOGRAM W/LOWER EXTREMITY;  Surgeon: Katha Cabal, MD;  Location: [place] [facility];  Service: Cardiovascular;  Laterality: N/A;   AORTA - BILATERAL FEMORAL ARTERY BYPASS GRAFT Bilateral 06/01/2021   Procedure: AORTA BIFEMORAL BYPASS GRAFT;  Surgeon: Katha Cabal, MD;  Location: [facility];  Service: Vascular;  Laterality: Bilateral;   APPLICATION OF WOUND VAC Right 02/12/2022   Procedure: APPLICATION OF WOUND VAC;  Surgeon: Katha Cabal, MD;   Location: [facility];  Service: Vascular;  Laterality: Right;   COLONOSCOPY WITH PROPOFOL N/A 04/21/2018   Procedure: COLONOSCOPY WITH PROPOFOL;  Surgeon: Jonathon Bellows, MD;  Location: [facility];  Service: Gastroenterology;  Laterality: N/A;   EMBOLECTOMY Right 02/12/2022   Procedure: EMBOLECTOMY;  Surgeon: Katha Cabal, MD;  Location: [facility];  Service: Vascular;  Laterality: Right;   ENDARTERECTOMY FEMORAL Right 02/12/2022   Procedure: ENDARTERECTOMY FEMORAL;  Surgeon: Katha Cabal, MD;  Location: [facility];  Service: Vascular;  Laterality: Right;   ENDARTERECTOMY POPLITEAL Right 06/02/2021   Procedure: Right Femoral Endarterectomy and Patching Endoplasty;  Surgeon: Serafina Mitchell, MD;  Location: [facility];  Service: Vascular;  Laterality: Right;   ESOPHAGOGASTRODUODENOSCOPY (EGD) WITH PROPOFOL N/A 04/21/2018   Procedure: ESOPHAGOGASTRODUODENOSCOPY (EGD) WITH PROPOFOL;  Surgeon: Jonathon Bellows, MD;  Location: [facility];  Service: Gastroenterology;  Laterality: N/A;   HIP PINNING Right    LOWER EXTREMITY ANGIOGRAPHY N/A 05/29/2021   Procedure: LOWER EXTREMITY ANGIOGRAPHY;  Surgeon: Katha Cabal, MD;  Location: [place] [facility];  Service: Cardiovascular;  Laterality: N/A;   LOWER EXTREMITY ANGIOGRAPHY Left 07/24/2021   Procedure: LOWER EXTREMITY ANGIOGRAPHY;  Surgeon: Katha Cabal, MD;  Location: [place] [facility];  Service: Cardiovascular;  Laterality: Left;   LOWER EXTREMITY ANGIOGRAPHY Right 08/07/2021   Procedure: Lower Extremity Angiography;  Surgeon: Katha Cabal, MD;  Location: [place] [facility];  Service: Cardiovascular;  Laterality: Right;   LOWER EXTREMITY ANGIOGRAPHY Left 02/12/2022   Procedure: Lower Extremity Angiography;  Surgeon: Katha Cabal, MD;  Location: [place] [facility];  Service: Cardiovascular;  Laterality: Left;   TOTAL HIP ARTHROPLASTY Right 10/22/2016   Procedure: TOTAL HIP ARTHROPLASTY ANTERIOR APPROACH;  Surgeon: Hessie Knows, MD;  Location: [facility];  Service: Orthopedics;  Laterality: Right;    Current Medications: Current Meds  Medication Sig   acetaminophen (TYLENOL) 500 MG tablet Take 1,000 mg by mouth every 6 (six) hours as needed (pain.).   aspirin EC 81 MG EC tablet Take 1 tablet (81 mg total) by mouth daily. Swallow whole.   atorvastatin (LIPITOR) 80 MG tablet Take 1 tablet (80 mg total) by mouth daily.   cilostazol (PLETAL) 100 MG tablet Take 1 tablet (100 mg total) by mouth 2 (two) times daily.   clopidogrel (PLAVIX) 75 MG tablet Take 1 tablet (75 mg total) by mouth daily.   DULoxetine (CYMBALTA) 30 MG capsule Take 90 mg by mouth daily.   ferrous sulfate 325 (65 FE) MG tablet Take 1 tablet (325 mg total) by mouth 2 (two) times daily with a meal.   folic acid (FOLVITE) 253 MCG tablet Take 400 mcg by mouth daily.   hydroxychloroquine (PLAQUENIL) 200 MG tablet Take 200 mg by mouth 2 (two) times daily.   ivabradine (CORLANOR) 5 MG TABS tablet Take 3 tablets (15 mg total) by mouth once for 1 dose. TWO HOURS PRIOR TO CARDIAC CTA   lisinopril (ZESTRIL) 20 MG tablet Take 20 mg by mouth daily.   LYRICA 100 MG capsule Take 100 mg by mouth 2 (two) times daily.   oxyCODONE-acetaminophen (PERCOCET/ROXICET) 5-325 MG tablet Take 1-2 tablets by mouth every 4 (four) hours as needed for moderate pain.   promethazine (PHENERGAN) 12.5 MG tablet Take 2 tablets (25 mg total) by mouth every 6 (six) hours as needed for nausea or vomiting.   [DISCONTINUED] amLODipine (NORVASC) 10 MG tablet Take 0.5 tablets (5 mg total) by mouth in the morning. (Patient taking differently: Take 10 mg by mouth in the morning.)   [DISCONTINUED] metoprolol succinate (TOPROL XL) 25 MG 24 hr tablet Take one tablet (25 mg) daily for 1 week. Keep track of your HR and BP. If HR remains over 80 consistently and blood pressure is normal to elevated (no less than 120), you can increase to 2 tablets (50 mg) daily. (Patient taking differently: 50 mg daily.  2 tablets (50 mg) daily.)     Allergies:   Patient has no known allergies.   Social History   Socioeconomic History   Marital status: Married    Spouse name: Not on file   Number of children: Not on file   Years of education: Not on file   Highest education level: Not on file  Occupational History   Not on file  Tobacco Use   Smoking status: Former    Packs/day: 0.25    Years: 16.00    Total pack years: 4.00    Types: Cigarettes    Quit date: 06/04/2021    Years since quitting: 1.1   Smokeless tobacco: Never  Vaping Use   Vaping Use: Never used  Substance and Sexual Activity   Alcohol use: No   Drug use: Not Currently   Sexual activity: Yes    Partners: Female  Other Topics Concern   Not on file  Social History Narrative   Not on file   Social Determinants of Health  Financial Resource Strain: Not on file  Food Insecurity: Not on file  Transportation Needs: Not on file  Physical Activity: Not on file  Stress: Not on file  Social Connections: Not on file     Family History: The patient's family history is not on file.  ROS:   Please see the history of present illness.     All other systems reviewed and are negative.  EKGs/Labs/Other Studies Reviewed:    The following studies were reviewed today:   EKG:  EKG is  ordered today.  The ekg ordered today demonstrates normal sinus rhythm  Recent Labs: 06/11/2022: ALT 21 07/15/2022: BUN 13; Creatinine, Ser 0.77; Hemoglobin 12.1; Platelets 256; Potassium 4.0; Sodium 136  Recent Lipid Panel No results found for: "CHOL", "TRIG", "HDL", "CHOLHDL", "VLDL", "LDLCALC", "LDLDIRECT"   Risk Assessment/Calculations:              Physical Exam:    VS:  BP 122/80 (BP Location: Right Arm)   Pulse 87   Ht 5\' 7"  (1.702 m)   Wt 189 lb 9.6 oz (86 kg)   SpO2 99%   BMI 29.70 kg/m     Wt Readings from Last 3 Encounters:  08/01/22 189 lb 9.6 oz (86 kg)  07/15/22 179 lb 14.3 oz (81.6 kg)  07/11/22 180 lb (81.6  kg)     GEN:  Well nourished, well developed in no acute distress HEENT: Normal NECK: No JVD; No carotid bruits CARDIAC: RRR, no murmurs, rubs, gallops RESPIRATORY:  Clear to auscultation without rales, wheezing or rhonchi  ABDOMEN: Soft, non-tender, non-distended MUSCULOSKELETAL:  No edema; No deformity  SKIN: Warm and dry NEUROLOGIC:  Alert and oriented x 3 PSYCHIATRIC:  Normal affect   ASSESSMENT:    1. Dyspnea on exertion   2. Primary hypertension   3. Periodic heart flutter   4. Anginal equivalent   5. PAD (peripheral artery disease) (HCC)    PLAN:    In order of problems listed above:  Dyspnea on exertion, diaphoresis.  Former smoker.  Echo 05/2021 EF 60 to 65%.  Obtain coronary CTA to rule out CAD.  Has PAD, continue aspirin, Lipitor. Hypertension, BP controlled.  Increase Toprol-XL to 50 mg twice daily, continue lisinopril.  Stop Norvasc with increasing Toprol-XL. Heart flutters, place cardiac monitor to evaluate any significant arrhythmias.  Toprol-XL as above. PAD, follows up with vascular surgery.  Continue aspirin, high intensity statin Lipitor 80 mg daily.  Follow-up after coronary CTA, cardiac monitor.     Medication Adjustments/Labs and Tests Ordered: Current medicines are reviewed at length with the patient today.  Concerns regarding medicines are outlined above.  Orders Placed This Encounter  Procedures   CT CORONARY MORPH W/CTA COR W/SCORE W/CA W/CM &/OR WO/CM   Basic Metabolic Panel (BMET)   LONG TERM MONITOR (3-14 DAYS)   EKG 12-Lead   Meds ordered this encounter  Medications   metoprolol succinate (TOPROL XL) 50 MG 24 hr tablet    Sig: Take 1 tablet (50 mg total) by mouth in the morning and at bedtime.    Dispense:  180 tablet    Refill:  3   ivabradine (CORLANOR) 5 MG TABS tablet    Sig: Take 3 tablets (15 mg total) by mouth once for 1 dose. TWO HOURS PRIOR TO CARDIAC CTA    Dispense:  3 tablet    Refill:  0    Patient Instructions   Medication Instructions:   STOP AMLODIPINE INCREASE METOPROLOL - take one tablet (  50 mg) by mouth twice a day.   *If you need a refill on your cardiac medications before your next appointment, please call your pharmacy*   Lab Work:  Your physician recommends you go to the medical mall for labs  If you have labs (blood work) drawn today and your tests are completely normal, you will receive your results only by: MyChart Message (if you have MyChart) OR A paper copy in the mail If you have any lab test that is abnormal or we need to change your treatment, we will call you to review the results.   Testing/Procedures:  Your physician has recommended that you wear a Zio monitor.   This monitor is a medical device that records the heart's electrical activity. Doctors most often use these monitors to diagnose arrhythmias. Arrhythmias are problems with the speed or rhythm of the heartbeat. The monitor is a small device applied to your chest. You can wear one while you do your normal daily activities. While wearing this monitor if you have any symptoms to push the button and record what you felt. Once you have worn this monitor for the period of time provider prescribed (Usually 14 days), you will return the monitor device in the postage paid box. Once it is returned they will download the data collected and provide [place] with a report which the provider will then review and we will call you with those results. Important tips:  Avoid showering during the first 24 hours of wearing the monitor. Avoid excessive sweating to help maximize wear time. Do not submerge the device, no hot tubs, and no swimming pools. Keep any lotions or oils away from the patch. After 24 hours you may shower with the patch on. Take brief showers with your back facing the shower head.  Do not remove patch once it has been placed because that will interrupt data and decrease adhesive wear time. Push the button when you have  any symptoms and write down what you were feeling. Once you have completed wearing your monitor, remove and place into box which has postage paid and place in your outgoing mailbox.  If for some reason you have misplaced your box then call our office and we can provide another box and/or mail it off for you.     Your cardiac CT will be scheduled   [facility] [address] [place], [place] [address] [phone]  If scheduled at [facility] or [facility], please arrive 15 mins early for check-in and test prep.   Please follow these instructions carefully (unless otherwise directed):  Hold all erectile dysfunction medications at least 3 days (72 hrs) prior to test. (Ie viagra, cialis, sildenafil, tadalafil, etc) We will administer nitroglycerin during this exam.   On the Night Before the Test: Be sure to Drink plenty of water. Do not consume any caffeinated/decaffeinated beverages or chocolate 12 hours prior to your test. Do not take any antihistamines 12 hours prior to your test.  On the Day of the Test: Drink plenty of water until 1 hour prior to the test. Do not eat any food 1 hour prior to test. You may take your regular medications prior to the test.  Take corlanor two hours prior to test.      After the Test: Drink plenty of water. After receiving IV contrast, you may experience a mild flushed feeling. This is normal. On occasion, you may experience a mild rash  up to 24 hours after the test. This is not dangerous. If this occurs, you can take Benadryl 25 mg and increase your fluid intake. If you experience trouble breathing, this can be serious. If it is severe call 911 IMMEDIATELY. If it is mild, please call our office.   Follow-Up: At [facility], you and your health needs are our priority.  As part of our continuing mission to provide you with exceptional heart care,  we have created designated Provider Care Teams.  These Care Teams include your primary Cardiologist (physician) and Advanced Practice Providers (APPs -  Physician Assistants and Nurse Practitioners) who all work together to provide you with the care you need, when you need it.  We recommend signing up for the patient portal called "MyChart".  Sign up information is provided on this After Visit Summary.  MyChart is used to connect with patients for Virtual Visits (Telemedicine).  Patients are able to view lab/test results, encounter notes, upcoming appointments, etc.  Non-urgent messages can be sent to your provider as well.   To learn more about what you can do with MyChart, go to [email or web address].    Your next appointment:   6 - 8    Provider:   You may see Debbe Odea, MD or one of the following Advanced Practice Providers on your designated Care Team:   Nicolasa Ducking, NP Eula Listen, PA-C Cadence Fransico Michael, PA-C Charlsie Quest, NP   Signed, Debbe Odea, MD  08/01/2022 12:19 PM    [facility]

## 2022-08-06 DIAGNOSIS — I498 Other specified cardiac arrhythmias: Secondary | ICD-10-CM | POA: Diagnosis not present

## 2022-08-07 ENCOUNTER — Ambulatory Visit: Payer: Medicaid Other | Admitting: Cardiology

## 2022-08-08 ENCOUNTER — Telehealth: Payer: Self-pay | Admitting: Cardiology

## 2022-08-08 NOTE — Telephone Encounter (Signed)
Called pharmacy -  Spoke with Nicki Reaper Made him aware that the corlanor is just a one time dose for a cardiac CTA so we can not do a PA on that and that patient will need to pay cash.   Made Scott aware that if patient could not afford it then we do have samples here in the office we could provide to patient.   Nicki Reaper said he could change it to Cash pay and if the patient has any issues when picking up this medication he would direct him to call our office.

## 2022-08-08 NOTE — Telephone Encounter (Signed)
Cindy with [facility] is calling stating they faxed over PA information for two medications the patient is needing for his upcoming cardiac procedure on 02/08. She is requesting a callback for an update regarding this. Please advise.

## 2022-08-12 ENCOUNTER — Telehealth: Payer: Self-pay | Admitting: Cardiology

## 2022-08-12 NOTE — Telephone Encounter (Signed)
Please see mychart encounter

## 2022-08-12 NOTE — Telephone Encounter (Signed)
Patient c/o Palpitations:  High priority if patient c/o lightheadedness, shortness of breath, or chest pain  How long have you had palpitations/irregular HR/ Afib? Are you having the symptoms now?   No  Are you currently experiencing lightheadedness, SOB or CP?   SOB during episodes  Do you have a history of afib (atrial fibrillation) or irregular heart rhythm?   Unknown  Have you checked your BP or HR? (document readings if available):  No  Are you experiencing any other symptoms?  Anxious more than usual   Patient stated that for the past two nights he has been having racing heart prior to falling asleep.  Patient stated his HR has been ranging from 92-100 before he started on new medication (metoprolol succinate (TOPROL XL) 50 MG 24 hr tablet).

## 2022-08-14 ENCOUNTER — Other Ambulatory Visit (INDEPENDENT_AMBULATORY_CARE_PROVIDER_SITE_OTHER): Payer: Self-pay | Admitting: Nurse Practitioner

## 2022-08-15 ENCOUNTER — Other Ambulatory Visit (INDEPENDENT_AMBULATORY_CARE_PROVIDER_SITE_OTHER): Payer: Self-pay | Admitting: Nurse Practitioner

## 2022-08-21 ENCOUNTER — Telehealth (HOSPITAL_COMMUNITY): Payer: Self-pay | Admitting: *Deleted

## 2022-08-21 NOTE — Telephone Encounter (Signed)
Reaching out to patient to offer assistance regarding upcoming cardiac imaging study; pt verbalizes understanding of appt date/time, parking situation and where to check in, pre-test NPO status and medications ordered, and verified current allergies; name and call back number provided for further questions should they arise  Gordy Clement RN Navigator Cardiac Imaging [facility] [phone] office [phone] cell  Patient to take '15mg'$  ivabradine two hours prior to his cardiac CT scan.

## 2022-08-22 ENCOUNTER — Ambulatory Visit
Admission: RE | Admit: 2022-08-22 | Discharge: 2022-08-22 | Disposition: A | Discharge status: Home / Self Care | Payer: Medicaid Other | Source: Ambulatory Visit | Attending: Cardiology | Admitting: Cardiology

## 2022-08-22 DIAGNOSIS — I2089 Other forms of angina pectoris: Secondary | ICD-10-CM | POA: Diagnosis present

## 2022-08-22 DIAGNOSIS — R0609 Other forms of dyspnea: Secondary | ICD-10-CM | POA: Insufficient documentation

## 2022-08-22 MED ORDER — METOPROLOL TARTRATE 5 MG/5ML IV SOLN
10.0000 mg | Freq: Once | INTRAVENOUS | Status: AC
  Administered 2022-08-22: 10 mg via INTRAVENOUS

## 2022-08-22 MED ORDER — METOPROLOL TARTRATE 5 MG/5ML IV SOLN: 10.0000 mg | Freq: Once | INTRAVENOUS | Status: DC

## 2022-08-22 MED ORDER — NITROGLYCERIN 0.4 MG SL SUBL
0.8000 mg | SUBLINGUAL_TABLET | Freq: Once | SUBLINGUAL | Status: AC
  Administered 2022-08-22: 0.8 mg via SUBLINGUAL

## 2022-08-22 MED ORDER — IOHEXOL 350 MG/ML SOLN
75.0000 mL | Freq: Once | INTRAVENOUS | Status: AC | PRN
  Administered 2022-08-22: 75 mL via INTRAVENOUS

## 2022-08-22 NOTE — Progress Notes (Signed)
Patient tolerated procedure well. Ambulate w/o difficulty. Denies light headedness or being dizzy. Sitting in chair drinking water provided. Encouraged to drink extra water today and reasoning explained. Verbalized understanding. All questions answered. ABC intact. No further needs. Discharge from procedure area w/o issues.   °

## 2022-08-26 ENCOUNTER — Other Ambulatory Visit: Payer: Self-pay

## 2022-08-30 ENCOUNTER — Encounter: Payer: Self-pay | Admitting: Cardiology

## 2022-09-30 ENCOUNTER — Ambulatory Visit: Payer: Medicaid Other | Attending: Cardiology | Admitting: Cardiology

## 2022-09-30 ENCOUNTER — Telehealth (INDEPENDENT_AMBULATORY_CARE_PROVIDER_SITE_OTHER): Payer: Self-pay

## 2022-09-30 NOTE — Telephone Encounter (Signed)
Patient reach out to the office stating that he has a sore lump a size of dime near right leg incision from previous surgery. Patient also experience some left leg pain but the pain has stop. Please Advise

## 2022-09-30 NOTE — Telephone Encounter (Signed)
He can come in and see GS or myself with a RLE art duplex

## 2022-10-01 ENCOUNTER — Encounter: Payer: Self-pay | Admitting: Cardiology

## 2022-10-01 ENCOUNTER — Other Ambulatory Visit (INDEPENDENT_AMBULATORY_CARE_PROVIDER_SITE_OTHER): Payer: Self-pay | Admitting: Nurse Practitioner

## 2022-10-01 DIAGNOSIS — I739 Peripheral vascular disease, unspecified: Secondary | ICD-10-CM

## 2022-10-01 DIAGNOSIS — M79604 Pain in right leg: Secondary | ICD-10-CM

## 2022-10-07 ENCOUNTER — Ambulatory Visit (INDEPENDENT_AMBULATORY_CARE_PROVIDER_SITE_OTHER): Payer: Medicaid Other | Admitting: Vascular Surgery

## 2022-10-07 ENCOUNTER — Encounter (INDEPENDENT_AMBULATORY_CARE_PROVIDER_SITE_OTHER): Payer: Self-pay | Admitting: Vascular Surgery

## 2022-10-07 ENCOUNTER — Ambulatory Visit (INDEPENDENT_AMBULATORY_CARE_PROVIDER_SITE_OTHER): Payer: Medicaid Other

## 2022-10-07 VITALS — BP 139/90 | HR 84 | Resp 18 | Ht 69.0 in | Wt 197.6 lb

## 2022-10-07 DIAGNOSIS — Z9889 Other specified postprocedural states: Secondary | ICD-10-CM

## 2022-10-07 DIAGNOSIS — M79604 Pain in right leg: Secondary | ICD-10-CM | POA: Diagnosis not present

## 2022-10-07 DIAGNOSIS — I739 Peripheral vascular disease, unspecified: Secondary | ICD-10-CM

## 2022-10-07 DIAGNOSIS — I10 Essential (primary) hypertension: Secondary | ICD-10-CM | POA: Diagnosis not present

## 2022-10-07 DIAGNOSIS — I70219 Atherosclerosis of native arteries of extremities with intermittent claudication, unspecified extremity: Secondary | ICD-10-CM

## 2022-10-07 DIAGNOSIS — M2550 Pain in unspecified joint: Secondary | ICD-10-CM

## 2022-10-07 DIAGNOSIS — E782 Mixed hyperlipidemia: Secondary | ICD-10-CM | POA: Diagnosis not present

## 2022-10-07 NOTE — Progress Notes (Signed)
MRN : [id number]  Jeffrey Grant. is a 42 y.o. (30-Jun-1980) male who presents with chief complaint of check circulation.  History of Present Illness:   The patient has had multiple vascular procedures recently.  He originally underwent angiogram on 02/12/2022 which acutely thrombosed immediately postprocedure.  That same day he underwent surgery including following:   PROCEDURE: 1.         Right common femoral and superficial femoral endarterectomy with Cormatrix patch angioplasty. 2.         Thromboembolectomy of the right limb aortobifemoral bypass graft with a #3 Fogarty. 3.         Redo right groin surgery for exposure of the femoral arteries.   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  Since that time the patient has done well.  He notes that his foot drop has much improved.  He also notes that he is ready to resume more physical activity.  Today's noninvasive studies show an of 1.05 on the right and 1.04 on the left.  He has a TBI 0.82 on the right and 0.85 on the left.  These are improved from his previous studies.  He has primarily triphasic waveforms in the right tibial vessels with biphasic/triphasic in the left.  The toe waveforms are normal bilaterally.  No outpatient medications have been marked as taking for the 10/07/22 encounter (Appointment) with Gilda Crease, Latina Craver, MD.    Past Medical History:  Diagnosis Date   Anxiety    Atherosclerotic PVD with intermittent claudication (HCC)    Back pain    Collagen vascular disease (HCC)    Dyspnea    Dysrhythmia    Elevated lipids    Hypertension    Nausea    Seropositive rheumatoid arthritis (HCC)     Past Surgical History:  Procedure Laterality Date   ABDOMINAL AORTOGRAM W/LOWER EXTREMITY N/A 02/26/2022   Procedure: ABDOMINAL AORTOGRAM W/LOWER EXTREMITY;  Surgeon: Renford Dills, MD;   Location: [facility];  Service: Cardiovascular;  Laterality: N/A;   AORTA - BILATERAL FEMORAL ARTERY BYPASS GRAFT Bilateral 06/01/2021   Procedure: AORTA BIFEMORAL BYPASS GRAFT;  Surgeon: Renford Dills, MD;  Location: [facility];  Service: Vascular;  Laterality: Bilateral;   APPLICATION OF WOUND VAC Right 02/12/2022   Procedure: APPLICATION OF WOUND VAC;  Surgeon: Renford Dills, MD;  Location: [facility];  Service: Vascular;  Laterality: Right;   COLONOSCOPY WITH PROPOFOL N/A 04/21/2018   Procedure: COLONOSCOPY WITH PROPOFOL;  Surgeon: [name], MD;  Location[facility];  Service: Gastroenterology;  Laterality: N/A;   EMBOLECTOMY Right 02/12/2022   Procedure: EMBOLECTOMY;  Surgeon: Renford Dills, MD;  Location: [facility];  Service: Vascular;  Laterality: Right;   ENDARTERECTOMY FEMORAL Right 02/12/2022   Procedure: ENDARTERECTOMY FEMORAL;  Surgeon: Renford Dills, MD;  Location: [facility];  Service: Vascular;  Laterality: Right;   ENDARTERECTOMY POPLITEAL Right 06/02/2021   Procedure: Right Femoral Endarterectomy and Patching Endoplasty;  Surgeon: Nada Libman, MD;  Location: [facility];  Service: Vascular;  Laterality: Right;   ESOPHAGOGASTRODUODENOSCOPY (EGD) WITH PROPOFOL N/A 04/21/2018   Procedure: ESOPHAGOGASTRODUODENOSCOPY (EGD) WITH PROPOFOL;  Surgeon: [name], MD;  Location: [facility];  Service: Gastroenterology;  Laterality: N/A;   HIP PINNING Right    LOWER EXTREMITY ANGIOGRAPHY N/A 05/29/2021   Procedure: LOWER  EXTREMITY ANGIOGRAPHY;  Surgeon: Renford Dills, MD;  Location: [facility];  Service: Cardiovascular;  Laterality: N/A;   LOWER EXTREMITY ANGIOGRAPHY Left 07/24/2021   Procedure: LOWER EXTREMITY ANGIOGRAPHY;  Surgeon: Renford Dills, MD;  Location: [facility];  Service: Cardiovascular;  Laterality: Left;   LOWER EXTREMITY ANGIOGRAPHY Right 08/07/2021   Procedure: Lower Extremity Angiography;  Surgeon: Renford Dills, MD;  Location: [facility];  Service: Cardiovascular;  Laterality: Right;   LOWER EXTREMITY ANGIOGRAPHY Left 02/12/2022   Procedure: Lower Extremity Angiography;  Surgeon: Renford Dills, MD;  Location: [facility];  Service: Cardiovascular;  Laterality: Left;   TOTAL HIP ARTHROPLASTY Right 10/22/2016   Procedure: TOTAL HIP ARTHROPLASTY ANTERIOR APPROACH;  Surgeon: Kennedy Bucker, MD;  Location: [facility];  Service: Orthopedics;  Laterality: Right;    Social History Social History   Tobacco Use   Smoking status: Former    Packs/day: 0.25    Years: 16.00    Additional pack years: 0.00    Total pack years: 4.00    Types: Cigarettes    Quit date: 06/04/2021    Years since quitting: 1.3   Smokeless tobacco: Never  Vaping Use   Vaping Use: Never used  Substance Use Topics   Alcohol use: No   Drug use: Not Currently    Family History No family history on file.  No Known Allergies   REVIEW OF SYSTEMS (Negative unless checked)  Constitutional: [] Weight loss  [] Fever  [] Chills Cardiac: [] Chest pain   [] Chest pressure   [] Palpitations   [] Shortness of breath when laying flat   [] Shortness of breath with exertion. Vascular:  [x] Pain in legs with walking   [] Pain in legs at rest  [] History of DVT   [] Phlebitis   [] Swelling in legs   [] Varicose veins   [] Non-healing ulcers Pulmonary:   [] Uses home oxygen   [] Productive cough   [] Hemoptysis   [] Wheeze  [] COPD   [] Asthma Neurologic:  [] Dizziness   [] Seizures   [] History of stroke   [] History of TIA  [] Aphasia   [] Vissual changes   [] Weakness or numbness in arm   [] Weakness or numbness in leg Musculoskeletal:   [] Joint swelling   [] Joint pain   [] Low back pain Hematologic:  [] Easy bruising  [] Easy bleeding   [] Hypercoagulable state   [] Anemic Gastrointestinal:  [] Diarrhea   [] Vomiting  [] Gastroesophageal reflux/heartburn   [] Difficulty swallowing. Genitourinary:  [] Chronic kidney disease   [] Difficult urination   [] Frequent urination   [] Blood in urine Skin:  [] Rashes   [] Ulcers  Psychological:  [] History of anxiety   []  History of major depression.  Physical Examination  There were no vitals filed for this visit. There is no height or weight on file to calculate BMI. Gen: WD/WN, NAD Head: [employer]/AT, No temporalis wasting.  Ear/Nose/Throat: Hearing grossly intact, nares w/o erythema or drainage Eyes: PER, EOMI, sclera nonicteric.  Neck: Supple, no masses.  No bruit or JVD.  Pulmonary:  Good air movement, no audible wheezing, no use of accessory muscles.  Cardiac: RRR, normal S1, S2, no Murmurs. Vascular:  mild trophic changes, no open wounds Vessel Right Left  Radial Palpable Palpable  PT Not Palpable Not Palpable  DP Not Palpable Not Palpable  Gastrointestinal: soft, non-distended. No guarding/no peritoneal signs.  Musculoskeletal: M/S 5/5 throughout.  No visible deformity.  Neurologic: CN 2-12 intact. Pain and light touch intact in extremities.  Symmetrical.  Speech is fluent. Motor exam as listed above.  Psychiatric: Judgment intact, Mood & affect appropriate for pt's clinical situation. Dermatologic: No rashes or ulcers noted.  No changes consistent with cellulitis.   CBC Lab Results  Component Value Date   WBC 9.9 07/15/2022   HGB 12.1 (L) 07/15/2022   HCT 37.6 (L) 07/15/2022   MCV 87.9 07/15/2022   PLT 256 07/15/2022    BMET    Component Value Date/Time   NA 139 08/01/2022 1231   NA 140 03/30/2018 1404   K 4.3 08/01/2022 1231   CL 105 08/01/2022 1231   CO2 25 08/01/2022 1231   GLUCOSE 129 (H) 08/01/2022 1231   BUN 18 08/01/2022 1231   BUN 9 03/30/2018 1404   CREATININE 0.98 08/01/2022 1231   CALCIUM 9.1 08/01/2022 1231   GFRNONAA >60 08/01/2022 1231   GFRAA 129 03/30/2018 1404   CrCl cannot be calculated (Patient's most recent lab result is older than the maximum 21 days allowed.).  COAG Lab Results  Component Value Date   INR 1.0 06/10/2022   INR 1.0 02/24/2022    INR 1.0 02/23/2022    Radiology No results found.   Assessment/Plan 1. Right leg pain *** - VAS [place] LOWER EXTREMITY ARTERIAL DUPLEX  2. Peripheral arterial disease with history of revascularization *** - VAS [place] LOWER EXTREMITY ARTERIAL DUPLEX    [name], MD  10/07/2022 3:08 PM

## 2022-10-10 ENCOUNTER — Encounter (INDEPENDENT_AMBULATORY_CARE_PROVIDER_SITE_OTHER): Payer: Self-pay | Admitting: Vascular Surgery

## 2022-10-21 ENCOUNTER — Other Ambulatory Visit (INDEPENDENT_AMBULATORY_CARE_PROVIDER_SITE_OTHER): Payer: Self-pay | Admitting: Vascular Surgery

## 2022-10-21 DIAGNOSIS — I713 Abdominal aortic aneurysm, ruptured, unspecified: Secondary | ICD-10-CM

## 2022-10-21 DIAGNOSIS — I998 Other disorder of circulatory system: Secondary | ICD-10-CM

## 2022-10-21 NOTE — Telephone Encounter (Signed)
Pt states verbal understanding an d will await a call to schedule the CT

## 2022-10-21 NOTE — Progress Notes (Signed)
Patient contacted the office he has not been scheduled yet for his CT scan.  In looking back at his note it is listed as being ordered.  I will place a new order stat.

## 2022-10-22 ENCOUNTER — Telehealth (INDEPENDENT_AMBULATORY_CARE_PROVIDER_SITE_OTHER): Payer: Self-pay | Admitting: Vascular Surgery

## 2022-10-22 NOTE — Telephone Encounter (Signed)
I LVM for pt on the mobile number [phone] asking for a call back regarding the CT ordered by Dr. Gilda Crease. I also ATC the house (primary) number and the VM is full/not set up. I was unable to LM on this #. I need to clarify the insurance and PA for CT.

## 2022-10-22 NOTE — Telephone Encounter (Signed)
Spoke with pt to get him scheduled for the CT results appt with Dr. Gilda Crease. He is scheduled tomorrow, May 1, at 5:30 pm. He would like for Dr. Gilda Crease to know that he is having bad hip pain still. He stated that he knows he is getting the CT tomorrow (and not trying to bother Dr. Gilda Crease), but he wanted him to know. He will follow up with Dr. Gilda Crease on Thursday May 2 at 8:30 am.

## 2022-10-22 NOTE — Telephone Encounter (Signed)
The CT scan is the best way for [place] to know what is going on and then based on that we will come up with the best treatment plan if it is found that the hip pain is vascular related

## 2022-10-22 NOTE — Telephone Encounter (Signed)
Providers have been made aware and results will be discuss with Dr Gilda Crease

## 2022-10-23 ENCOUNTER — Ambulatory Visit
Admission: RE | Admit: 2022-10-23 | Discharge: 2022-10-23 | Disposition: A | Discharge status: Home / Self Care | Payer: Medicaid Other | Source: Ambulatory Visit | Attending: Vascular Surgery | Admitting: Vascular Surgery

## 2022-10-23 DIAGNOSIS — I713 Abdominal aortic aneurysm, ruptured, unspecified: Secondary | ICD-10-CM | POA: Insufficient documentation

## 2022-10-23 MED ORDER — IOHEXOL 350 MG/ML SOLN
100.0000 mL | Freq: Once | INTRAVENOUS | Status: AC | PRN
  Administered 2022-10-23: 100 mL via INTRAVENOUS

## 2022-10-24 ENCOUNTER — Ambulatory Visit (INDEPENDENT_AMBULATORY_CARE_PROVIDER_SITE_OTHER): Payer: Medicaid Other | Admitting: Vascular Surgery

## 2022-10-24 VITALS — BP 122/81 | HR 106 | Resp 17 | Ht 68.0 in | Wt 204.4 lb

## 2022-10-24 DIAGNOSIS — E782 Mixed hyperlipidemia: Secondary | ICD-10-CM | POA: Diagnosis not present

## 2022-10-24 DIAGNOSIS — I10 Essential (primary) hypertension: Secondary | ICD-10-CM

## 2022-10-24 DIAGNOSIS — I70219 Atherosclerosis of native arteries of extremities with intermittent claudication, unspecified extremity: Secondary | ICD-10-CM | POA: Diagnosis not present

## 2022-10-24 DIAGNOSIS — M069 Rheumatoid arthritis, unspecified: Secondary | ICD-10-CM | POA: Diagnosis not present

## 2022-10-24 NOTE — Progress Notes (Signed)
MRN : [id number]  Jeffrey Grant. is a 42 y.o. (27-Jan-1981) male who presents with chief complaint of check circulation.  History of Present Illness: The patient returns to the office for followup and review status post  CT angiogram on 10/23/2022.   I have reviewed the CT with the patient.  Aortobifemoral bypass is widely patent.  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  No evidence of bladder abnormalities.  The patient notes stability in the lower extremity symptoms. No interval shortening of the patient's claudication distance or rest pain symptoms. No new ulcers or wounds have occurred since the last visit.  There have been no significant changes to the patient's overall health care.  No documented history of amaurosis fugax or recent TIA symptoms. There are no recent neurological changes noted. No documented history of DVT, PE or superficial thrombophlebitis. The patient denies recent episodes of angina or shortness of breath.     No outpatient medications have been marked as taking for the 10/24/22 encounter (Appointment) with Gilda Crease, Latina Craver, MD.    Past Medical History:  Diagnosis Date   Anxiety    Atherosclerotic PVD with intermittent claudication (HCC)    Back pain    Collagen vascular disease (HCC)    Dyspnea    Dysrhythmia    Elevated lipids    Hypertension    Nausea    Seropositive rheumatoid arthritis (HCC)     Past Surgical History:  Procedure Laterality Date   ABDOMINAL AORTOGRAM W/LOWER EXTREMITY N/A 02/26/2022   Procedure: ABDOMINAL AORTOGRAM W/LOWER EXTREMITY;  Surgeon: Renford Dills, MD;  Location: [facility];  Service: Cardiovascular;  Laterality: N/A;   AORTA - BILATERAL FEMORAL ARTERY BYPASS GRAFT Bilateral 06/01/2021   Procedure: AORTA BIFEMORAL BYPASS GRAFT;  Surgeon: Renford Dills, MD;  Location: [facility];  Service: Vascular;  Laterality: Bilateral;   APPLICATION OF WOUND VAC Right 02/12/2022   Procedure: APPLICATION OF  WOUND VAC;  Surgeon: Renford Dills, MD;  Location: [facility];  Service: Vascular;  Laterality: Right;   COLONOSCOPY WITH PROPOFOL N/A 04/21/2018   Procedure: COLONOSCOPY WITH PROPOFOL;  Surgeon: [name], MD;  Location: [facility];  Service: Gastroenterology;  Laterality: N/A;   EMBOLECTOMY Right 02/12/2022   Procedure: EMBOLECTOMY;  Surgeon: Renford Dills, MD;  Location: [facility];  Service: Vascular;  Laterality: Right;   ENDARTERECTOMY FEMORAL Right 02/12/2022   Procedure: ENDARTERECTOMY FEMORAL;  Surgeon: Renford Dills, MD;  Location: [facility];  Service: Vascular;  Laterality: Right;   ENDARTERECTOMY POPLITEAL Right 06/02/2021   Procedure: Right Femoral Endarterectomy and Patching Endoplasty;  Surgeon: Nada Libman, MD;  Location: [facility];  Service: Vascular;  Laterality: Right;   ESOPHAGOGASTRODUODENOSCOPY (EGD) WITH PROPOFOL N/A 04/21/2018   Procedure: ESOPHAGOGASTRODUODENOSCOPY (EGD) WITH PROPOFOL;  Surgeon: [name], MD;  Location: [facility];  Service: Gastroenterology;  Laterality: N/A;   HIP PINNING Right    LOWER EXTREMITY ANGIOGRAPHY N/A 05/29/2021   Procedure: LOWER EXTREMITY ANGIOGRAPHY;  Surgeon: Renford Dills, MD;  Location: [facility];  Service: Cardiovascular;  Laterality: N/A;   LOWER EXTREMITY ANGIOGRAPHY Left 07/24/2021   Procedure: LOWER EXTREMITY ANGIOGRAPHY;  Surgeon: Renford Dills, MD;  Location: [facility];  Service: Cardiovascular;  Laterality: Left;   LOWER EXTREMITY ANGIOGRAPHY Right 08/07/2021   Procedure: Lower Extremity Angiography;  Surgeon: Renford Dills, MD;  Location: [facility];  Service: Cardiovascular;  Laterality: Right;   LOWER EXTREMITY ANGIOGRAPHY Left 02/12/2022   Procedure: Lower Extremity Angiography;  Surgeon: Renford Dills, MD;  Location: [facility];  Service: Cardiovascular;  Laterality: Left;   TOTAL HIP ARTHROPLASTY Right 10/22/2016   Procedure: TOTAL HIP  ARTHROPLASTY ANTERIOR APPROACH;  Surgeon: Kennedy Bucker, MD;  Location: [facility];  Service: Orthopedics;  Laterality: Right;    Social History Social History   Tobacco Use   Smoking status: Former    Packs/day: 0.25    Years: 16.00    Additional pack years: 0.00    Total pack years: 4.00    Types: Cigarettes    Quit date: 06/04/2021    Years since quitting: 1.3   Smokeless tobacco: Never  Vaping Use   Vaping Use: Never used  Substance Use Topics   Alcohol use: No   Drug use: Not Currently    Family History No family history on file.  No Known Allergies   REVIEW OF SYSTEMS (Negative unless checked)  Constitutional: [] Weight loss  [] Fever  [] Chills Cardiac: [] Chest pain   [] Chest pressure   [] Palpitations   [] Shortness of breath when laying flat   [] Shortness of breath with exertion. Vascular:  [x] Pain in legs with walking   [] Pain in legs at rest  [] History of DVT   [] Phlebitis   [] Swelling in legs   [] Varicose veins   [] Non-healing ulcers Pulmonary:   [] Uses home oxygen   [] Productive cough   [] Hemoptysis   [] Wheeze  [] COPD   [] Asthma Neurologic:  [] Dizziness   [] Seizures   [] History of stroke   [] History of TIA  [] Aphasia   [] Vissual changes   [] Weakness or numbness in arm   [] Weakness or numbness in leg Musculoskeletal:   [] Joint swelling   [] Joint pain   [] Low back pain Hematologic:  [] Easy bruising  [] Easy bleeding   [] Hypercoagulable state   [] Anemic Gastrointestinal:  [] Diarrhea   [] Vomiting  [] Gastroesophageal reflux/heartburn   [] Difficulty swallowing. Genitourinary:  [] Chronic kidney disease   [] Difficult urination  [] Frequent urination   [] Blood in urine Skin:  [] Rashes   [] Ulcers  Psychological:  [] History of anxiety   []  History of major depression.  Physical Examination  There were no vitals filed for this visit. There is no height or weight on file to calculate BMI. Gen: WD/WN, NAD Head: [place]/AT, No temporalis wasting.  Ear/Nose/Throat: Hearing grossly  intact, nares w/o erythema or drainage Eyes: PER, EOMI, sclera nonicteric.  Neck: Supple, no masses.  No bruit or JVD.  Pulmonary:  Good air movement, no audible wheezing, no use of accessory muscles.  Cardiac: RRR, normal S1, S2, no Murmurs. Vascular:  mild trophic changes, no open wounds Vessel Right Left  Radial Palpable Palpable  PT Not Palpable Not Palpable  DP Not Palpable Not Palpable  Gastrointestinal: soft, non-distended. No guarding/no peritoneal signs.  Musculoskeletal: M/S 5/5 throughout.  No visible deformity.  Neurologic: CN 2-12 intact. Pain and light touch intact in extremities.  Symmetrical.  Speech is fluent. Motor exam as listed above. Psychiatric: Judgment intact, Mood & affect appropriate for pt's clinical situation. Dermatologic: No rashes or ulcers noted.  No changes consistent with cellulitis.   CBC Lab Results  Component Value Date   WBC 9.9 07/15/2022   HGB 12.1 (L) 07/15/2022   HCT 37.6 (L) 07/15/2022   MCV 87.9 07/15/2022   PLT 256 07/15/2022    BMET    Component Value Date/Time   NA 139 08/01/2022 1231   NA 140 03/30/2018  1404   K 4.3 08/01/2022 1231   CL 105 08/01/2022 1231   CO2 25 08/01/2022 1231   GLUCOSE 129 (H) 08/01/2022 1231   BUN 18 08/01/2022 1231   BUN 9 03/30/2018 1404   CREATININE 0.98 08/01/2022 1231   CALCIUM 9.1 08/01/2022 1231   GFRNONAA >60 08/01/2022 1231   GFRAA 129 03/30/2018 1404   CrCl cannot be calculated (Patient's most recent lab result is older than the maximum 21 days allowed.).  COAG Lab Results  Component Value Date   INR 1.0 06/10/2022   INR 1.0 02/24/2022   INR 1.0 02/23/2022    Radiology CT ANGIO ABDOMEN PELVIS  W & WO CONTRAST  Result Date: 10/23/2022 CLINICAL DATA:  Abdominal aortic aneurysm. Preop planning. 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 EXAM: CTA ABDOMEN AND PELVIS WITHOUT AND WITH CONTRAST TECHNIQUE: Multidetector CT imaging of the abdomen  and pelvis was performed using the standard protocol during bolus administration of intravenous contrast. Multiplanar reconstructed images and MIPs were obtained and reviewed to evaluate the vascular anatomy. RADIATION DOSE REDUCTION: This exam was performed according to the departmental dose-optimization program which includes automated exposure control, adjustment of the mA and/or kV according to patient size and/or use of iterative reconstruction technique. CONTRAST:  OMNIPAQUE IOHEXOL 350 MG/ML SOLN COMPARISON:  CT of the abdomen pelvis dated 05/05/2022. FINDINGS: VASCULAR Aorta: Complete occlusion of the infrarenal abdominal aorta approximately 5 cm distal to left renal artery. Status post prior aorto bi femoral bypass graft. The bypass graft is patent. No abdominal aortic aneurysm. Celiac: The celiac trunk and its major branches are patent. SMA: The SMA is patent. Renals: The renal arteries are patent. IMA: The IMA is not visualized and appears occluded. Inflow: The native iliac arteries are completely occluded. The iliac limbs of the bypass graft are patent. Proximal Outflow: The common femoral arteries and deep femoral arteries are patent bilaterally. The left superficial femoral artery is patent. A stent is noted in the proximal right superficial femoral artery. The right SFA remains patent. Status post prior repair of pseudoaneurysm in the right groin with associated streak artifact. No hematoma or fluid collection. Veins: The IVC is unremarkable.  No portal venous gas. Review of the MIP images confirms the above findings. NON-VASCULAR Lower chest: The visualized lung bases are clear. No intra-abdominal free air or free fluid. Hepatobiliary: Slight irregularity of the liver contour may represent early changes of cirrhosis. Clinical correlation is recommended. No biliary dilatation. The gallbladder is unremarkable. Pancreas: Unremarkable. No pancreatic ductal dilatation or surrounding inflammatory  changes. Spleen: Normal in size without focal abnormality. Adrenals/Urinary Tract: The adrenal glands are unremarkable. Small bilateral renal cysts. There is no hydronephrosis or nephrolithiasis on either side. There is symmetric enhancement and excretion of contrast by both kidneys. The visualized ureters appear unremarkable. The urinary bladder is collapsed. Stomach/Bowel: There is moderate stool throughout the colon. There is no bowel obstruction or active inflammation. The appendix is normal. Lymphatic: No adenopathy. Reproductive: The prostate and seminal vesicles are grossly unremarkable. No pelvic mass. Other: Midline vertical anterior abdominal wall incisional scar. There is laxity of the midline anterior abdominal wall musculature with a small fat containing ventral hernia. No fluid collection. Musculoskeletal: Total right hip arthroplasty. No acute osseous pathology. IMPRESSION: 1. 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 The bypass graft is patent. 2. Status post prior repair of pseudoaneurysm in the right groin with associated streak  artifact. No hematoma or fluid collection. 3. No acute intra-abdominal or pelvic pathology. Electronically Signed   By: Elgie Collard M.D.   On: 10/23/2022 18:08   VAS [place] LOWER EXTREMITY ARTERIAL DUPLEX  Result Date: 10/21/2022 LOWER EXTREMITY ARTERIAL DUPLEX STUDY Patient Name:  Djimon Stutes.  Date of Exam:   10/07/2022 Medical Rec #: [id number]          Accession #:    [id number] Date of Birth: 1980-12-31          Patient Gender: M Patient Age:   69 years Exam Location:  [facility] Procedure:      VAS [place] LOWER EXTREMITY ARTERIAL DUPLEX Referring Phys: Sheppard Plumber --------------------------------------------------------------------------------  Indications: Claudication, peripheral artery disease, and superficial lump right              groin. Patient states it popped and bled a little x 4days.  High Risk Factors: Hypertension.  Vascular Interventions: 06/01/21: Aortobifemoral BPG with bilateral CFA/SFA                         endarterectomies;                         06/02/21: Right SFA endarterectomy/angioplasty;                          07/24/2021: Left Lower Extremity Angiogram Third order                         catheter placement. Angioplasty and Stent placement Left                         SFA and Popliteal Artery.                          08/07/2021: PTA and Stent placement Right SFA.                         02/26/2022 right fem pop bypass. Current ABI:            na Performing Technologist: Salvadore Farber RVT  Examination Guidelines: A complete evaluation includes B-mode imaging, spectral Doppler, color Doppler, and power Doppler as needed of all accessible portions of each vessel. Bilateral testing is considered an integral part of a complete examination. Limited examinations for reoccurring indications may be performed as noted.  +----------+--------+-----+--------+--------+--------+ RIGHT     PSV cm/sRatioStenosisWaveformComments +----------+--------+-----+--------+--------+--------+ EIA Distal149                                   +----------+--------+-----+--------+--------+--------+ CFA Mid   139                                   +----------+--------+-----+--------+--------+--------+ DFA       204                                   +----------+--------+-----+--------+--------+--------+  Summary: Right: Patent EIA, CFA, and DFA. Nothing seen by ultrasound in area of previous concern.  See table(s) above for measurements and observations. Electronically signed by [name]  MD on 10/21/2022 at 8:47:16 AM.    Final      Assessment/Plan 1. Atherosclerotic peripheral vascular disease with intermittent claudication (HCC)  Recommend:  The patient has evidence of atherosclerosis of the lower extremities with claudication.  The patient does not voice lifestyle limiting  changes at this point in time.  Noninvasive studies do not suggest clinically significant change.  No invasive studies, angiography or surgery at this time The patient should continue walking and begin a more formal exercise program.  The patient should continue antiplatelet therapy and aggressive treatment of the lipid abnormalities  No changes in the patient's medications at this time  Continued surveillance is indicated as atherosclerosis is likely to progress with time.    The patient will continue follow up with noninvasive studies as ordered.  - VAS [place] ABI WITH/WO TBI; Future - VAS [place] LOWER EXTREMITY ARTERIAL DUPLEX; Future  2. Benign essential hypertension Continue antihypertensive medications as already ordered, these medications have been reviewed and there are no changes at this time.  3. Mixed hyperlipidemia Continue statin as ordered and reviewed, no changes at this time  4. Rheumatoid arthritis involving multiple sites, unspecified whether rheumatoid factor present (HCC) Continue NSAID medications as already ordered, these medications have been reviewed and there are no changes at this time.  Continued activity and therapy was stressed.    [name], MD  10/24/2022 1:19 PM

## 2022-10-26 ENCOUNTER — Encounter (INDEPENDENT_AMBULATORY_CARE_PROVIDER_SITE_OTHER): Payer: Self-pay | Admitting: Vascular Surgery

## 2023-01-09 ENCOUNTER — Other Ambulatory Visit (INDEPENDENT_AMBULATORY_CARE_PROVIDER_SITE_OTHER): Payer: Medicaid Other

## 2023-01-09 ENCOUNTER — Ambulatory Visit (INDEPENDENT_AMBULATORY_CARE_PROVIDER_SITE_OTHER): Payer: Medicaid Other | Admitting: Vascular Surgery

## 2023-01-09 ENCOUNTER — Encounter (INDEPENDENT_AMBULATORY_CARE_PROVIDER_SITE_OTHER): Payer: Medicaid Other

## 2023-01-27 ENCOUNTER — Other Ambulatory Visit (INDEPENDENT_AMBULATORY_CARE_PROVIDER_SITE_OTHER): Payer: Self-pay | Admitting: Nurse Practitioner

## 2023-02-05 ENCOUNTER — Other Ambulatory Visit: Payer: Self-pay | Admitting: Student

## 2023-02-05 DIAGNOSIS — R569 Unspecified convulsions: Secondary | ICD-10-CM

## 2023-02-09 ENCOUNTER — Ambulatory Visit
Admission: RE | Admit: 2023-02-09 | Discharge: 2023-02-09 | Disposition: A | Discharge status: Home / Self Care | Payer: MEDICAID | Source: Ambulatory Visit | Attending: Student | Admitting: Student

## 2023-02-09 DIAGNOSIS — R569 Unspecified convulsions: Secondary | ICD-10-CM | POA: Insufficient documentation

## 2023-02-09 MED ORDER — GADOBUTROL 1 MMOL/ML IV SOLN
9.0000 mL | Freq: Once | INTRAVENOUS | Status: AC | PRN
  Administered 2023-02-09: 9 mL via INTRAVENOUS

## 2023-03-10 ENCOUNTER — Ambulatory Visit: Payer: MEDICAID | Admitting: Gastroenterology

## 2023-04-15 ENCOUNTER — Other Ambulatory Visit: Payer: Self-pay

## 2023-04-15 ENCOUNTER — Emergency Department: Payer: MEDICAID

## 2023-04-15 ENCOUNTER — Emergency Department
Admission: EM | Admit: 2023-04-15 | Discharge: 2023-04-15 | Disposition: A | Discharge status: Home / Self Care | Payer: MEDICAID | Attending: Emergency Medicine | Admitting: Emergency Medicine

## 2023-04-15 ENCOUNTER — Encounter: Payer: Self-pay | Admitting: Emergency Medicine

## 2023-04-15 DIAGNOSIS — R319 Hematuria, unspecified: Secondary | ICD-10-CM | POA: Diagnosis not present

## 2023-04-15 DIAGNOSIS — R1084 Generalized abdominal pain: Secondary | ICD-10-CM | POA: Diagnosis present

## 2023-04-15 DIAGNOSIS — R1012 Left upper quadrant pain: Secondary | ICD-10-CM | POA: Diagnosis not present

## 2023-04-15 LAB — COMPREHENSIVE METABOLIC PANEL
ALT: 21 U/L (ref 0–44)
AST: 33 U/L (ref 15–41)
Albumin: 4.2 g/dL (ref 3.5–5.0)
Alkaline Phosphatase: 82 U/L (ref 38–126)
Anion gap: 9 (ref 5–15)
BUN: 13 mg/dL (ref 6–20)
CO2: 24 mmol/L (ref 22–32)
Calcium: 9.5 mg/dL (ref 8.9–10.3)
Chloride: 108 mmol/L (ref 98–111)
Creatinine, Ser: 0.9 mg/dL (ref 0.61–1.24)
GFR, Estimated: >60 mL/min (ref >60)
Glucose, Bld: 133 mg/dL — ABNORMAL HIGH (ref 70–99)
Potassium: 3.8 mmol/L (ref 3.5–5.1)
Sodium: 141 mmol/L (ref 135–145)
Total Bilirubin: 0.6 mg/dL (ref 0.3–1.2)
Total Protein: 7.7 g/dL (ref 6.5–8.1)

## 2023-04-15 LAB — URINALYSIS, ROUTINE W REFLEX MICROSCOPIC
Bilirubin Urine: NEGATIVE
Glucose, UA: NEGATIVE mg/dL
Ketones, ur: NEGATIVE mg/dL
Leukocytes,Ua: NEGATIVE
Nitrite: NEGATIVE
Protein, ur: 30 mg/dL — AB
RBC / HPF: >50 RBC/hpf (ref 0–5)
Specific Gravity, Urine: 1.016 (ref 1.005–1.030)
pH: 5 (ref 5.0–8.0)

## 2023-04-15 LAB — CBC WITH DIFFERENTIAL/PLATELET
Abs Immature Granulocytes: 0.04 10*3/uL (ref 0.00–0.07)
Basophils Absolute: 0 10*3/uL (ref 0.0–0.1)
Basophils Relative: 0 %
Eosinophils Absolute: 0.5 10*3/uL (ref 0.0–0.5)
Eosinophils Relative: 5 %
HCT: 44 % (ref 39.0–52.0)
Hemoglobin: 14.4 g/dL (ref 13.0–17.0)
Immature Granulocytes: 0 %
Lymphocytes Relative: 31 %
Lymphs Abs: 2.9 10*3/uL (ref 0.7–4.0)
MCH: 29 pg (ref 26.0–34.0)
MCHC: 32.7 g/dL (ref 30.0–36.0)
MCV: 88.7 fL (ref 80.0–100.0)
Monocytes Absolute: 0.8 10*3/uL (ref 0.1–1.0)
Monocytes Relative: 8 %
Neutro Abs: 5.3 10*3/uL (ref 1.7–7.7)
Neutrophils Relative %: 56 %
Platelets: 262 10*3/uL (ref 150–400)
RBC: 4.96 MIL/uL (ref 4.22–5.81)
RDW: 15.2 % (ref 11.5–15.5)
WBC: 9.5 10*3/uL (ref 4.0–10.5)
nRBC: 0 % (ref 0.0–0.2)

## 2023-04-15 LAB — LIPASE, BLOOD: Lipase: 64 U/L — ABNORMAL HIGH (ref 11–51)

## 2023-04-15 MED ORDER — MORPHINE SULFATE (PF) 4 MG/ML IV SOLN
4.0000 mg | Freq: Once | INTRAVENOUS | Status: AC
  Administered 2023-04-15: 4 mg via INTRAVENOUS
  Filled 2023-04-15: qty 1

## 2023-04-15 MED ORDER — HYDROMORPHONE HCL 1 MG/ML IJ SOLN
0.5000 mg | Freq: Once | INTRAMUSCULAR | Status: AC
  Administered 2023-04-15: 0.5 mg via INTRAVENOUS
  Filled 2023-04-15: qty 0.5

## 2023-04-15 MED ORDER — ONDANSETRON HCL 4 MG/2ML IJ SOLN
4.0000 mg | Freq: Once | INTRAMUSCULAR | Status: AC
  Administered 2023-04-15: 4 mg via INTRAVENOUS
  Filled 2023-04-15: qty 2

## 2023-04-15 MED ORDER — IOHEXOL 350 MG/ML SOLN
100.0000 mL | Freq: Once | INTRAVENOUS | Status: AC | PRN
  Administered 2023-04-15: 100 mL via INTRAVENOUS

## 2023-04-15 MED ORDER — LABETALOL HCL 5 MG/ML IV SOLN
20.0000 mg | Freq: Once | INTRAVENOUS | Status: AC
  Administered 2023-04-15: 20 mg via INTRAVENOUS
  Filled 2023-04-15: qty 4

## 2023-04-15 NOTE — ED Provider Notes (Signed)
7:42 AM Assumed care for off going team.   Blood pressure (!) 205/103, pulse 86, temperature 98.3 F (36.8 C), resp. rate 20, height 5\' 9"  (1.753 m), weight 81.6 kg, SpO2 100%.  See their HPI for full report but in brief pending CT imaging   Lipase slightly elevated CBC shows stable hemoglobin.  CMP shows normal creatinine.  Urine without evidence of UTI    IMPRESSION: 1. Patent aortobifemoral bypass graft. 2. No evidence of occlusive disease involving the celiac axis or superior mesenteric artery. 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 3. 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 4. Possible subtle thickening of the left bladder wall but this is very inconclusive by CT due to streak artifact from a right hip arthroplasty. Correlation suggested with urinalysis. Cystoscopy may be necessary to exclude bladder lesion if there is persistent hematuria.    Discussed the case with Dr. Gilda Crease who reviewed the CT imaging and agree that they did not look like anything acute.  Postvoid bladder scan is 0 therefore no signs of retention.  We discussed return precautions in regards to retention.  We discussed that he since he is on Plavix that if he develops worsening bleeding and any signs of anemia he should also return for recheck hemoglobin but this time he can hold off on catheterization he can follow-up with urology he expressed understanding understands return precautions but feels comfortable with discharge home     Concha Se, MD 04/15/23 1159

## 2023-04-15 NOTE — Discharge Instructions (Signed)
Please call urology's number to make a follow-up appointment to discuss your hematuria and to discuss getting a cystoscopy.  If you develop inability to pee or feel lightheaded weak return for a recheck of your hemoglobin or if you have any other concerns.  Also follow-up with your primary care doctor to discuss your blood pressure  IMPRESSION: 1. Patent aortobifemoral bypass graft. 2. No evidence of occlusive disease involving the celiac axis or superior mesenteric artery. 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 3. 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 4. Possible subtle thickening of the left bladder wall but this is very inconclusive by CT due to streak artifact from a right hip arthroplasty. Correlation suggested with urinalysis. Cystoscopy may be necessary to exclude bladder lesion if there is persistent hematuria.

## 2023-04-15 NOTE — ED Provider Notes (Signed)
[facility] Provider Note    Event Date/Time   First MD Initiated Contact with Patient 04/15/23 [phone]     (approximate)   History   Abdominal Pain   HPI  Jeffrey Grant. is a 42 y.o. male with severe peripheral arterial disease including aortobifemoral bypass presents with left-sided abdominal pain and hematuria.  He reports he started noticing the hematuria yesterday, pain started overnight.  He reports it is moderate to severe.  Mild nausea no vomiting.      Physical Exam   Triage Vital Signs: ED Triage Vitals  Encounter Vitals Group     BP 04/15/23 0553 (!) 191/98     Systolic BP Percentile --      Diastolic BP Percentile --      Pulse Rate 04/15/23 0553 91     Resp 04/15/23 0553 20     Temp 04/15/23 0553 98.3 F (36.8 C)     Temp src --      SpO2 04/15/23 0553 100 %     Weight 04/15/23 0551 81.6 kg (180 lb)     Height 04/15/23 0551 1.753 m (5\' 9" )     Head Circumference --      Peak Flow --      Pain Score 04/15/23 0551 10     Pain Loc --      Pain Education --      Exclude from Growth Chart --     Most recent vital signs: Vitals:   04/15/23 0553  BP: (!) 191/98  Pulse: 91  Resp: 20  Temp: 98.3 F (36.8 C)  SpO2: 100%     General: Awake, no distress.  CV:  Good peripheral perfusion.  Resp:  Normal effort.  Abd:  No distention.  Mild tenderness palpation left upper quadrant, left CVA area Other:  Extremities warm and well-perfused   ED Results / Procedures / Treatments   Labs (all labs ordered are listed, but only abnormal results are displayed) Labs Reviewed  CBC WITH DIFFERENTIAL/PLATELET  COMPREHENSIVE METABOLIC PANEL  URINALYSIS, ROUTINE W REFLEX MICROSCOPIC  LIPASE, BLOOD     EKG     RADIOLOGY CT abdomen pelvis pending    PROCEDURES:  Critical Care performed:   Procedures   MEDICATIONS ORDERED IN ED: Medications  morphine (PF) 4 MG/ML injection 4 mg (has no administration in time range)   ondansetron (ZOFRAN) injection 4 mg (has no administration in time range)  labetalol (NORMODYNE) injection 20 mg (has no administration in time range)     IMPRESSION / MDM / ASSESSMENT AND PLAN / ED COURSE  I reviewed the triage vital signs and the nursing notes. Patient's presentation is most consistent with acute presentation with potential threat to life or bodily function.  Patient with aortic occlusion with bifemoral bypass on clopidogrel presents with left side abdominal pain.  He reports he was out of his clopidogrel for a week and only recently started again.  Notably hypertensive but this is chronic for the patient.  Given pain, history of PAD, hematuria will require CT angiography, will treat with IV morphine, IV Zofran  Will give IV labetalol for blood pressure  Lab work pending  I have asked my colleague to follow-up on labs and CT results        FINAL CLINICAL IMPRESSION(S) / ED DIAGNOSES   Final diagnoses:  Generalized abdominal pain     Rx / DC Orders   ED Discharge Orders     None  Note:  This document was prepared using Dragon voice recognition software and may include unintentional dictation errors.   Jene Every, MD 04/15/23 [phone]

## 2023-04-15 NOTE — ED Triage Notes (Signed)
Pt to triage via w/c with c/o left sided abd pain radiating into back since last night accomp by hematuria and nausea

## 2023-04-28 ENCOUNTER — Encounter (INDEPENDENT_AMBULATORY_CARE_PROVIDER_SITE_OTHER): Payer: Medicaid Other

## 2023-04-28 ENCOUNTER — Ambulatory Visit (INDEPENDENT_AMBULATORY_CARE_PROVIDER_SITE_OTHER): Payer: Medicaid Other | Admitting: Vascular Surgery

## 2023-04-28 NOTE — Progress Notes (Deleted)
MRN : [id number]  Jeffrey Grant. is a 42 y.o. (May 10, 1981) male who presents with chief complaint of check circulation.  History of Present Illness:    The patient returns to the office for followup and review status post  CT angiogram on 04/15/2023.    I have reviewed the CT with the patient.  Aortobifemoral bypass is widely patent.  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  No evidence of bladder abnormalities.   The patient notes stability in the lower extremity symptoms. No interval shortening of the patient's claudication distance or rest pain symptoms. No new ulcers or wounds have occurred since the last visit.   There have been no significant changes to the patient's overall health care.   No documented history of amaurosis fugax or recent TIA symptoms. There are no recent neurological changes noted. No documented history of DVT, PE or superficial thrombophlebitis. The patient denies recent episodes of angina or shortness of breath.   No outpatient medications have been marked as taking for the 04/28/23 encounter (Appointment) with Gilda Crease, Latina Craver, MD.    Past Medical History:  Diagnosis Date   Anxiety    Atherosclerotic PVD with intermittent claudication (HCC)    Back pain    Collagen vascular disease (HCC)    Dyspnea    Dysrhythmia    Elevated lipids    Hypertension    Nausea    Seropositive rheumatoid arthritis (HCC)     Past Surgical History:  Procedure Laterality Date   ABDOMINAL AORTOGRAM W/LOWER EXTREMITY N/A 02/26/2022   Procedure: ABDOMINAL AORTOGRAM W/LOWER EXTREMITY;  Surgeon: Renford Dills, MD;  Location: [facility];  Service: Cardiovascular;  Laterality: N/A;   AORTA - BILATERAL FEMORAL ARTERY BYPASS GRAFT Bilateral 06/01/2021   Procedure: AORTA BIFEMORAL BYPASS GRAFT;  Surgeon: Renford Dills, MD;  Location: [facility];  Service: Vascular;  Laterality: Bilateral;    APPLICATION OF WOUND VAC Right 02/12/2022   Procedure: APPLICATION OF WOUND VAC;  Surgeon: Renford Dills, MD;  Location: [facility];  Service: Vascular;  Laterality: Right;   COLONOSCOPY WITH PROPOFOL N/A 04/21/2018   Procedure: COLONOSCOPY WITH PROPOFOL;  Surgeon: [name], MD;  Location: [facility];  Service: Gastroenterology;  Laterality: N/A;   EMBOLECTOMY Right 02/12/2022   Procedure: EMBOLECTOMY;  Surgeon: Renford Dills, MD;  Location: [facility];  Service: Vascular;  Laterality: Right;   ENDARTERECTOMY FEMORAL Right 02/12/2022   Procedure: ENDARTERECTOMY FEMORAL;  Surgeon: Renford Dills, MD;  Location: [facility];  Service: Vascular;  Laterality: Right;   ENDARTERECTOMY POPLITEAL Right 06/02/2021   Procedure: Right Femoral Endarterectomy and Patching Endoplasty;  Surgeon: Nada Libman, MD;  Location: [facility];  Service: Vascular;  Laterality: Right;   ESOPHAGOGASTRODUODENOSCOPY (EGD) WITH PROPOFOL N/A 04/21/2018   Procedure: ESOPHAGOGASTRODUODENOSCOPY (EGD) WITH PROPOFOL;  Surgeon: [name], MD;  Location: [facility];  Service: Gastroenterology;  Laterality: N/A;   HIP PINNING Right    LOWER EXTREMITY ANGIOGRAPHY N/A 05/29/2021   Procedure: LOWER EXTREMITY ANGIOGRAPHY;  Surgeon: Renford Dills, MD;  Location: [facility];  Service: Cardiovascular;  Laterality: N/A;  LOWER EXTREMITY ANGIOGRAPHY Left 07/24/2021   Procedure: LOWER EXTREMITY ANGIOGRAPHY;  Surgeon: Renford Dills, MD;  Location: [facility];  Service: Cardiovascular;  Laterality: Left;   LOWER EXTREMITY ANGIOGRAPHY Right 08/07/2021   Procedure: Lower Extremity Angiography;  Surgeon: Renford Dills, MD;  Location: [facility];  Service: Cardiovascular;  Laterality: Right;   LOWER EXTREMITY ANGIOGRAPHY Left 02/12/2022   Procedure: Lower Extremity Angiography;  Surgeon: Renford Dills, MD;  Location: [facility];  Service: Cardiovascular;  Laterality: Left;    TOTAL HIP ARTHROPLASTY Right 10/22/2016   Procedure: TOTAL HIP ARTHROPLASTY ANTERIOR APPROACH;  Surgeon: Kennedy Bucker, MD;  Location: [facility];  Service: Orthopedics;  Laterality: Right;    Social History Social History   Tobacco Use   Smoking status: Former    Current packs/day: 0.00    Average packs/day: 0.3 packs/day for 16.0 years (4.0 ttl pk-yrs)    Types: Cigarettes    Start date: 06/04/2005    Quit date: 06/04/2021    Years since quitting: 1.8   Smokeless tobacco: Never  Vaping Use   Vaping status: Never Used  Substance Use Topics   Alcohol use: No   Drug use: Not Currently    Family History No family history on file.  No Known Allergies   REVIEW OF SYSTEMS (Negative unless checked)  Constitutional: [] Weight loss  [] Fever  [] Chills Cardiac: [] Chest pain   [] Chest pressure   [] Palpitations   [] Shortness of breath when laying flat   [] Shortness of breath with exertion. Vascular:  [x] Pain in legs with walking   [] Pain in legs at rest  [] History of DVT   [] Phlebitis   [] Swelling in legs   [] Varicose veins   [] Non-healing ulcers Pulmonary:   [] Uses home oxygen   [] Productive cough   [] Hemoptysis   [] Wheeze  [] COPD   [] Asthma Neurologic:  [] Dizziness   [] Seizures   [] History of stroke   [] History of TIA  [] Aphasia   [] Vissual changes   [] Weakness or numbness in arm   [] Weakness or numbness in leg Musculoskeletal:   [] Joint swelling   [x] Joint pain   [] Low back pain Hematologic:  [] Easy bruising  [] Easy bleeding   [] Hypercoagulable state   [] Anemic Gastrointestinal:  [] Diarrhea   [] Vomiting  [] Gastroesophageal reflux/heartburn   [] Difficulty swallowing. Genitourinary:  [] Chronic kidney disease   [] Difficult urination  [] Frequent urination   [] Blood in urine Skin:  [] Rashes   [] Ulcers  Psychological:  [x] History of anxiety   [x]  History of depression.  Physical Examination  There were no vitals filed for this visit. There is no height or weight on file to calculate  BMI. Gen: WD/WN, NAD Head: /AT, No temporalis wasting.  Ear/Nose/Throat: Hearing grossly intact, nares w/o erythema or drainage Eyes: PER, EOMI, sclera nonicteric.  Neck: Supple, no masses.  No bruit or JVD.  Pulmonary:  Good air movement, no audible wheezing, no use of accessory muscles.  Cardiac: RRR, normal S1, S2, no Murmurs. Vascular:  mild trophic changes, no open wounds Vessel Right Left  Radial Palpable Palpable  PT Not Palpable Not Palpable  DP Not Palpable Not Palpable  Gastrointestinal: soft, non-distended. No guarding/no peritoneal signs.  Musculoskeletal: M/S 5/5 throughout.  No visible deformity.  Neurologic: CN 2-12 intact. Pain and light touch intact in extremities.  Symmetrical.  Speech is fluent. Motor exam as listed above. Psychiatric: Judgment intact, Mood & affect appropriate for pt's clinical situation. Dermatologic: No rashes or ulcers noted.  No changes consistent with  cellulitis.   CBC Lab Results  Component Value Date   WBC 9.5 04/15/2023   HGB 14.4 04/15/2023   HCT 44.0 04/15/2023   MCV 88.7 04/15/2023   PLT 262 04/15/2023    BMET    Component Value Date/Time   NA 141 04/15/2023 0555   NA 140 03/30/2018 1404   K 3.8 04/15/2023 0555   CL 108 04/15/2023 0555   CO2 24 04/15/2023 0555   GLUCOSE 133 (H) 04/15/2023 0555   BUN 13 04/15/2023 0555   BUN 9 03/30/2018 1404   CREATININE 0.90 04/15/2023 0555   CALCIUM 9.5 04/15/2023 0555   GFRNONAA >60 04/15/2023 0555   GFRAA 129 03/30/2018 1404   Estimated Creatinine Clearance: 106.9 mL/min (by C-G formula based on SCr of 0.9 mg/dL).  COAG Lab Results  Component Value Date   INR 1.0 06/10/2022   INR 1.0 02/24/2022   INR 1.0 02/23/2022    Radiology CT Angio Abd/Pel W and/or Wo Contrast  Result Date: 04/15/2023 CLINICAL DATA:  Left-sided abdominal pain and hematuria. History of aortobifemoral bypass grafting. EXAM: CT ANGIOGRAPHY ABDOMEN AND PELVIS WITH CONTRAST TECHNIQUE: Multidetector CT  imaging of the abdomen and pelvis was performed using the standard protocol during bolus administration of intravenous contrast. Multiplanar reconstructed images and MIPs were obtained and reviewed to evaluate the vascular anatomy. RADIATION DOSE REDUCTION: This exam was performed according to the departmental dose-optimization program which includes automated exposure control, adjustment of the mA and/or kV according to patient size and/or use of iterative reconstruction technique. CONTRAST:  OMNIPAQUE IOHEXOL 350 MG/ML SOLN COMPARISON:  10/23/2022 FINDINGS: VASCULAR Aorta: Patent native aorta up to the level of a patent aortobifemoral bypass graft. Aortobifemoral graft limbs are widely patent to the level of their distal anastomoses. Celiac: Normally patent. Normally patent branch vessels and branch vessel anatomy. SMA: Normally patent. Renals: Normally patent bilateral single renal arteries. IMA: Chronically occluded. 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 Proximal Outflow: Below the aortobifemoral bypass graft anastomoses, distal common femoral arteries and femoral bifurcations are normally patent. 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 Partial visualization a proximal right SFA stent. Veins: Venous phase imaging demonstrates normal patency of venous structures in the abdomen and pelvis. Review of the MIP images confirms the above findings. NON-VASCULAR Lower chest: No acute abnormality. Hepatobiliary: No focal liver abnormality is seen. No gallstones, gallbladder wall thickening, or biliary dilatation. Pancreas: Unremarkable. No pancreatic ductal dilatation or surrounding inflammatory changes. Spleen: Normal in size without focal abnormality. Adrenals/Urinary Tract: No adrenal masses. Stable appearance of kidneys with bilateral Bosniak 1 cysts requiring no follow-up. No hydronephrosis. The bladder is  fairly decompressed on arterial and venous phases. On the venous phase, evaluation is somewhat limited due to streak artifact from a right hip arthroplasty. There may be subtle thickening of the left bladder wall but this is very inconclusive by CT. Correlation suggested with urinalysis. Cystoscopy may be necessary to exclude bladder lesion if there is persistent hematuria. Stomach/Bowel: Bowel shows no evidence of obstruction, ileus, inflammation or lesion. The appendix is normal. No free intraperitoneal air. Lymphatic: No enlarged abdominal or pelvic lymph nodes. Reproductive: Prostate is unremarkable. Other: Stable laxity of the midline abdominal wall superior to the umbilicus. No ascites or focal fluid collections. Musculoskeletal: No acute or significant osseous findings. IMPRESSION: 1. Patent aortobifemoral bypass graft. 2. No evidence of occlusive disease involving the celiac axis or superior mesenteric artery. 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 3. 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 4. Possible subtle thickening of the left bladder wall but this is very inconclusive by CT due to streak artifact from a right hip arthroplasty. Correlation suggested with urinalysis. Cystoscopy may be necessary to exclude bladder lesion if there is persistent hematuria. Electronically Signed   By: Irish Lack M.D.   On: 04/15/2023 11:01     Assessment/Plan There are no diagnoses linked to this encounter.   [name], MD  04/28/2023 1:13 PM

## 2023-06-13 ENCOUNTER — Encounter (INDEPENDENT_AMBULATORY_CARE_PROVIDER_SITE_OTHER): Payer: Self-pay

## 2023-06-26 ENCOUNTER — Ambulatory Visit: Payer: MEDICAID | Admitting: Urology

## 2023-06-30 ENCOUNTER — Encounter: Payer: Self-pay | Admitting: Urology

## 2023-06-30 ENCOUNTER — Ambulatory Visit: Payer: MEDICAID | Admitting: Urology

## 2023-06-30 VITALS — BP 120/80 | HR 74 | Ht 69.0 in | Wt 182.0 lb

## 2023-06-30 DIAGNOSIS — N401 Enlarged prostate with lower urinary tract symptoms: Secondary | ICD-10-CM

## 2023-06-30 DIAGNOSIS — R310 Gross hematuria: Secondary | ICD-10-CM

## 2023-06-30 DIAGNOSIS — R399 Unspecified symptoms and signs involving the genitourinary system: Secondary | ICD-10-CM

## 2023-06-30 LAB — MICROSCOPIC EXAMINATION

## 2023-06-30 LAB — URINALYSIS, COMPLETE
Bilirubin, UA: NEGATIVE
Glucose, UA: NEGATIVE
Ketones, UA: NEGATIVE
Leukocytes,UA: NEGATIVE
Nitrite, UA: NEGATIVE
Specific Gravity, UA: >1.03 — ABNORMAL HIGH (ref 1.005–1.030)
Urobilinogen, Ur: 0.2 mg/dL (ref 0.2–1.0)
pH, UA: 5.5 (ref 5.0–7.5)

## 2023-06-30 LAB — BLADDER SCAN AMB NON-IMAGING: Scan Result: 21

## 2023-06-30 NOTE — Progress Notes (Signed)
 I,Amy L Pierron,acting as a scribe for Glendia JAYSON Barba, MD.,have documented all relevant documentation on the behalf of Glendia JAYSON Barba, MD,as directed by  Glendia JAYSON Barba, MD while in the presence of Glendia JAYSON Barba, MD.  06/30/2023 7:13 PM   Jeffrey Grant. 08-Oct-1980 [id number]  Referring provider: Buren Rock HERO, MD [address] [place],  [place] [address]  Chief Complaint  Patient presents with   Hematuria    HPI: Jeffrey Grant. is a 43 y.o. male referred for evaluation of gross hematuria.  ED visit 04/15/23 for left abdominal pain and gross hematuria. Evaluation included a urinalysis which was grossly clear but >50 RBCs. CT angio abdomen/pelvis with and without contrast showed stable bilateral Bosniak 1 renal cysts. History of gross hematuria in 2004 and underwent CT and cystoscopy with the only finding of renal cyst.  Was on Plavix , however, was not taking at the time he had the episode of hematuria. Has had mild intermittent hematuria since his ED visit.  He also has urinary tract symptoms including frequency, urgency, weak urinary stream; IPSS 20/35. Urinalysis today dipstick trace blood/ 2+ ketone microscopy negative.  PMH: Past Medical History:  Diagnosis Date   Anxiety    Atherosclerotic PVD with intermittent claudication (HCC)    Back pain    Collagen vascular disease (HCC)    Dyspnea    Dysrhythmia    Elevated lipids    Hypertension    Nausea    Seropositive rheumatoid arthritis (HCC)     Surgical History: Past Surgical History:  Procedure Laterality Date   ABDOMINAL AORTOGRAM W/LOWER EXTREMITY N/A 02/26/2022   Procedure: ABDOMINAL AORTOGRAM W/LOWER EXTREMITY;  Surgeon: Jama Cordella MATSU, MD;  Location: [facility];  Service: Cardiovascular;  Laterality: N/A;   AORTA - BILATERAL FEMORAL ARTERY BYPASS GRAFT Bilateral 06/01/2021   Procedure: AORTA BIFEMORAL BYPASS GRAFT;  Surgeon: Jama Cordella MATSU, MD;  Location: [facility];  Service:  Vascular;  Laterality: Bilateral;   APPLICATION OF WOUND VAC Right 02/12/2022   Procedure: APPLICATION OF WOUND VAC;  Surgeon: Jama Cordella MATSU, MD;  Location: [facility];  Service: Vascular;  Laterality: Right;   COLONOSCOPY WITH PROPOFOL  N/A 04/21/2018   Procedure: COLONOSCOPY WITH PROPOFOL ;  Surgeon: Therisa Bi, MD;  Location: [facility]COPY;  Service: Gastroenterology;  Laterality: N/A;   EMBOLECTOMY Right 02/12/2022   Procedure: EMBOLECTOMY;  Surgeon: Jama Cordella MATSU, MD;  Location: [facility];  Service: Vascular;  Laterality: Right;   ENDARTERECTOMY FEMORAL Right 02/12/2022   Procedure: ENDARTERECTOMY FEMORAL;  Surgeon: Jama Cordella MATSU, MD;  Location: [facility];  Service: Vascular;  Laterality: Right;   ENDARTERECTOMY POPLITEAL Right 06/02/2021   Procedure: Right Femoral Endarterectomy and Patching Endoplasty;  Surgeon: Serene Gaile ORN, MD;  Location: [facility];  Service: Vascular;  Laterality: Right;   ESOPHAGOGASTRODUODENOSCOPY (EGD) WITH PROPOFOL  N/A 04/21/2018   Procedure: ESOPHAGOGASTRODUODENOSCOPY (EGD) WITH PROPOFOL ;  Surgeon: Therisa Bi, MD;  Location: [facility]COPY;  Service: Gastroenterology;  Laterality: N/A;   HIP PINNING Right    LOWER EXTREMITY ANGIOGRAPHY N/A 05/29/2021   Procedure: LOWER EXTREMITY ANGIOGRAPHY;  Surgeon: Jama Cordella MATSU, MD;  Location: [facility];  Service: Cardiovascular;  Laterality: N/A;   LOWER EXTREMITY ANGIOGRAPHY Left 07/24/2021   Procedure: LOWER EXTREMITY ANGIOGRAPHY;  Surgeon: Jama Cordella MATSU, MD;  Location: [facility];  Service: Cardiovascular;  Laterality: Left;   LOWER EXTREMITY ANGIOGRAPHY Right 08/07/2021   Procedure: Lower Extremity Angiography;  Surgeon: Jama Cordella MATSU, MD;  Location:  [facility];  Service: Cardiovascular;  Laterality: Right;   LOWER EXTREMITY ANGIOGRAPHY Left 02/12/2022   Procedure: Lower Extremity Angiography;  Surgeon: Jama Cordella MATSU, MD;  Location: [facility];  Service:  Cardiovascular;  Laterality: Left;   TOTAL HIP ARTHROPLASTY Right 10/22/2016   Procedure: TOTAL HIP ARTHROPLASTY ANTERIOR APPROACH;  Surgeon: Ozell Flake, MD;  Location: [facility];  Service: Orthopedics;  Laterality: Right;    Home Medications:  Allergies as of 06/30/2023   No Known Allergies      Medication List        Accurate as of June 30, 2023  7:13 PM. If you have any questions, ask your nurse or doctor.          STOP taking these medications    oxyCODONE -acetaminophen  5-325 MG tablet Commonly known as: PERCOCET/ROXICET Stopped by: Lyda Colcord C Danaria Larsen   oxyCODONE -acetaminophen  7.5-325 MG tablet Commonly known as: PERCOCET Stopped by: Nasiya Pascual C Shateria Paternostro       TAKE these medications    acetaminophen  500 MG tablet Commonly known as: TYLENOL  Take 1,000 mg by mouth every 6 (six) hours as needed (pain.).   aspirin  EC 81 MG tablet Take 1 tablet (81 mg total) by mouth daily. Swallow whole.   atorvastatin  80 MG tablet Commonly known as: LIPITOR  Take 1 tablet (80 mg total) by mouth daily.   cilostazol  100 MG tablet Commonly known as: PLETAL  Take 1 tablet (100 mg total) by mouth 2 (two) times daily.   clopidogrel  75 MG tablet Commonly known as: PLAVIX  Take 1 tablet (75 mg total) by mouth daily.   cyclobenzaprine 10 MG tablet Commonly known as: FLEXERIL Take 10 mg by mouth 3 (three) times daily.   DULoxetine  30 MG capsule Commonly known as: CYMBALTA  Take 90 mg by mouth daily.   ferrous sulfate  325 (65 FE) MG tablet Take 1 tablet (325 mg total) by mouth 2 (two) times daily with a meal.   folic acid  400 MCG tablet Commonly known as: FOLVITE  Take 400 mcg by mouth daily.   hydroxychloroquine  200 MG tablet Commonly known as: PLAQUENIL  Take 200 mg by mouth 2 (two) times daily.   lisinopril  20 MG tablet Commonly known as: ZESTRIL  Take 20 mg by mouth daily.   Lyrica  100 MG capsule Generic drug: pregabalin  Take 100 mg by mouth 2 (two) times daily.    metoprolol  succinate 50 MG 24 hr tablet Commonly known as: Toprol  XL Take 1 tablet (50 mg total) by mouth in the morning and at bedtime.   promethazine  12.5 MG tablet Commonly known as: PHENERGAN  Take 2 tablets (25 mg total) by mouth every 6 (six) hours as needed for nausea or vomiting.        Allergies: No Known Allergies  Family History: History reviewed. No pertinent family history.  Social History:  reports that he quit smoking about 2 years ago. His smoking use included cigarettes. He started smoking about 18 years ago. He has a 4 pack-year smoking history. He has never used smokeless tobacco. He reports that he does not currently use drugs. He reports that he does not drink alcohol.   Physical Exam: BP 120/80   Pulse 74   Ht 5' 9 (1.753 m)   Wt 182 lb (82.6 kg)   BMI 26.88 kg/m   Constitutional:  Alert and oriented, No acute distress. HEENT: [place] AT Respiratory: Normal respiratory effort, no increased work of breathing. Psychiatric: Normal mood and affect.   Pertinent Imaging: CT images were personally reviewed  and interpreted.   Assessment & Plan:    1. Gross hematuria CT abdomen/ pelvis with and without contrast showed no significant upper tract abnormalities. Schedule cystoscopy for lower tract evaluation.   2. BPH with LUTs.  PVR 21 ml. Current symptoms are not bothersome enough that he desires medical management.   I have reviewed the above documentation for accuracy and completeness, and I agree with the above.   Glendia JAYSON Barba, MD  [facility] [address] [place], [place] [address] [phone]

## 2023-07-15 ENCOUNTER — Other Ambulatory Visit (INDEPENDENT_AMBULATORY_CARE_PROVIDER_SITE_OTHER): Payer: Self-pay | Admitting: Vascular Surgery

## 2023-07-15 DIAGNOSIS — I70219 Atherosclerosis of native arteries of extremities with intermittent claudication, unspecified extremity: Secondary | ICD-10-CM

## 2023-07-20 NOTE — Progress Notes (Unsigned)
MRN : [id number]  Jeffrey Tosh. is a 43 y.o. (Nov 20, 1980) male who presents with chief complaint of check circulation.  History of Present Illness:   The patient returns to the office for followup and review status post  CT angiogram on 10/23/2022.    I have reviewed the CT with the patient.  Aortobifemoral bypass is widely patent.  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  No evidence of bladder abnormalities.   The patient notes stability in the lower extremity symptoms. No interval shortening of the patient's claudication distance or rest pain symptoms. No new ulcers or wounds have occurred since the last visit.   There have been no significant changes to the patient's overall health care.   No documented history of amaurosis fugax or recent TIA symptoms. There are no recent neurological changes noted. No documented history of DVT, PE or superficial thrombophlebitis. The patient denies recent episodes of angina or shortness of breath.   No outpatient medications have been marked as taking for the 07/21/23 encounter (Appointment) with Gilda Crease, Latina Craver, MD.    Past Medical History:  Diagnosis Date   Anxiety    Atherosclerotic PVD with intermittent claudication (HCC)    Back pain    Collagen vascular disease (HCC)    Dyspnea    Dysrhythmia    Elevated lipids    Hypertension    Nausea    Seropositive rheumatoid arthritis (HCC)     Past Surgical History:  Procedure Laterality Date   ABDOMINAL AORTOGRAM W/LOWER EXTREMITY N/A 02/26/2022   Procedure: ABDOMINAL AORTOGRAM W/LOWER EXTREMITY;  Surgeon: Renford Dills, MD;  Location: [facility];  Service: Cardiovascular;  Laterality: N/A;   AORTA - BILATERAL FEMORAL ARTERY BYPASS GRAFT Bilateral 06/01/2021   Procedure: AORTA BIFEMORAL BYPASS GRAFT;  Surgeon: Renford Dills, MD;  Location: [facility];  Service: Vascular;  Laterality: Bilateral;   APPLICATION  OF WOUND VAC Right 02/12/2022   Procedure: APPLICATION OF WOUND VAC;  Surgeon: Renford Dills, MD;  Location: [facility];  Service: Vascular;  Laterality: Right;   COLONOSCOPY WITH PROPOFOL N/A 04/21/2018   Procedure: COLONOSCOPY WITH PROPOFOL;  Surgeon: [name], MD;  Location: [facility];  Service: Gastroenterology;  Laterality: N/A;   EMBOLECTOMY Right 02/12/2022   Procedure: EMBOLECTOMY;  Surgeon: Renford Dills, MD;  Location: [facility];  Service: Vascular;  Laterality: Right;   ENDARTERECTOMY FEMORAL Right 02/12/2022   Procedure: ENDARTERECTOMY FEMORAL;  Surgeon: Renford Dills, MD;  Location: [facility];  Service: Vascular;  Laterality: Right;   ENDARTERECTOMY POPLITEAL Right 06/02/2021   Procedure: Right Femoral Endarterectomy and Patching Endoplasty;  Surgeon: Nada Libman, MD;  Location: [facility];  Service: Vascular;  Laterality: Right;   ESOPHAGOGASTRODUODENOSCOPY (EGD) WITH PROPOFOL N/A 04/21/2018   Procedure: ESOPHAGOGASTRODUODENOSCOPY (EGD) WITH PROPOFOL;  Surgeon: [name], MD;  Location[facility]COPY;  Service: Gastroenterology;  Laterality: N/A;   HIP PINNING Right    LOWER EXTREMITY ANGIOGRAPHY N/A 05/29/2021   Procedure: LOWER EXTREMITY ANGIOGRAPHY;  Surgeon: Renford Dills, MD;  Location: [facility];  Service: Cardiovascular;  Laterality: N/A;  LOWER EXTREMITY ANGIOGRAPHY Left 07/24/2021   Procedure: LOWER EXTREMITY ANGIOGRAPHY;  Surgeon: Renford Dills, MD;  Location: [facility];  Service: Cardiovascular;  Laterality: Left;   LOWER EXTREMITY ANGIOGRAPHY Right 08/07/2021   Procedure: Lower Extremity Angiography;  Surgeon: Renford Dills, MD;  Location: [facility];  Service: Cardiovascular;  Laterality: Right;   LOWER EXTREMITY ANGIOGRAPHY Left 02/12/2022   Procedure: Lower Extremity Angiography;  Surgeon: Renford Dills, MD;  Location: [facility];  Service: Cardiovascular;  Laterality: Left;   TOTAL HIP  ARTHROPLASTY Right 10/22/2016   Procedure: TOTAL HIP ARTHROPLASTY ANTERIOR APPROACH;  Surgeon: Kennedy Bucker, MD;  Location: [facility];  Service: Orthopedics;  Laterality: Right;    Social History Social History   Tobacco Use   Smoking status: Former    Current packs/day: 0.00    Average packs/day: 0.3 packs/day for 16.0 years (4.0 ttl pk-yrs)    Types: Cigarettes    Start date: 06/04/2005    Quit date: 06/04/2021    Years since quitting: 2.1   Smokeless tobacco: Never  Vaping Use   Vaping status: Never Used  Substance Use Topics   Alcohol use: No   Drug use: Not Currently    Family History No family history on file.  No Known Allergies   REVIEW OF SYSTEMS (Negative unless checked)  Constitutional: [] Weight loss  [] Fever  [] Chills Cardiac: [] Chest pain   [] Chest pressure   [] Palpitations   [] Shortness of breath when laying flat   [] Shortness of breath with exertion. Vascular:  [x] Pain in legs with walking   [] Pain in legs at rest  [] History of DVT   [] Phlebitis   [] Swelling in legs   [] Varicose veins   [] Non-healing ulcers Pulmonary:   [] Uses home oxygen   [] Productive cough   [] Hemoptysis   [] Wheeze  [] COPD   [] Asthma Neurologic:  [] Dizziness   [] Seizures   [] History of stroke   [] History of TIA  [] Aphasia   [] Vissual changes   [] Weakness or numbness in arm   [] Weakness or numbness in leg Musculoskeletal:   [] Joint swelling   [x] Joint pain   [] Low back pain Hematologic:  [] Easy bruising  [] Easy bleeding   [] Hypercoagulable state   [] Anemic Gastrointestinal:  [] Diarrhea   [] Vomiting  [] Gastroesophageal reflux/heartburn   [] Difficulty swallowing. Genitourinary:  [] Chronic kidney disease   [] Difficult urination  [] Frequent urination   [] Blood in urine Skin:  [] Rashes   [] Ulcers  Psychological:  [] History of anxiety   []  History of major depression.  Physical Examination  There were no vitals filed for this visit. There is no height or weight on file to calculate BMI. Gen:  WD/WN, NAD Head: [place]/AT, No temporalis wasting.  Ear/Nose/Throat: Hearing grossly intact, nares w/o erythema or drainage Eyes: PER, EOMI, sclera nonicteric.  Neck: Supple, no masses.  No bruit or JVD.  Pulmonary:  Good air movement, no audible wheezing, no use of accessory muscles.  Cardiac: RRR, normal S1, S2, no Murmurs. Vascular:  mild trophic changes, no open wounds Vessel Right Left  Radial Palpable Palpable  PT Not Palpable Not Palpable  DP Not Palpable Not Palpable  Gastrointestinal: soft, non-distended. No guarding/no peritoneal signs.  Musculoskeletal: M/S 5/5 throughout.  No visible deformity.  Neurologic: CN 2-12 intact. Pain and light touch intact in extremities.  Symmetrical.  Speech is fluent. Motor exam as listed above. Psychiatric: Judgment intact, Mood & affect appropriate for pt's clinical situation. Dermatologic: No rashes or ulcers noted.  No changes consistent  with cellulitis.   CBC Lab Results  Component Value Date   WBC 9.5 04/15/2023   HGB 14.4 04/15/2023   HCT 44.0 04/15/2023   MCV 88.7 04/15/2023   PLT 262 04/15/2023    BMET    Component Value Date/Time   NA 141 04/15/2023 0555   NA 140 03/30/2018 1404   K 3.8 04/15/2023 0555   CL 108 04/15/2023 0555   CO2 24 04/15/2023 0555   GLUCOSE 133 (H) 04/15/2023 0555   BUN 13 04/15/2023 0555   BUN 9 03/30/2018 1404   CREATININE 0.90 04/15/2023 0555   CALCIUM 9.5 04/15/2023 0555   GFRNONAA >60 04/15/2023 0555   GFRAA 129 03/30/2018 1404   CrCl cannot be calculated (Patient's most recent lab result is older than the maximum 21 days allowed.).  COAG Lab Results  Component Value Date   INR 1.0 06/10/2022   INR 1.0 02/24/2022   INR 1.0 02/23/2022    Radiology No results found.   Assessment/Plan There are no diagnoses linked to this encounter.   [name], MD  07/20/2023 2:54 PM

## 2023-07-21 ENCOUNTER — Ambulatory Visit (INDEPENDENT_AMBULATORY_CARE_PROVIDER_SITE_OTHER): Payer: MEDICAID | Admitting: Vascular Surgery

## 2023-07-21 ENCOUNTER — Ambulatory Visit (INDEPENDENT_AMBULATORY_CARE_PROVIDER_SITE_OTHER): Payer: MEDICAID

## 2023-07-21 ENCOUNTER — Encounter (INDEPENDENT_AMBULATORY_CARE_PROVIDER_SITE_OTHER): Payer: Self-pay | Admitting: Vascular Surgery

## 2023-07-21 VITALS — BP 170/117 | HR 101 | Resp 18 | Ht 69.0 in | Wt 182.2 lb

## 2023-07-21 DIAGNOSIS — M069 Rheumatoid arthritis, unspecified: Secondary | ICD-10-CM | POA: Diagnosis not present

## 2023-07-21 DIAGNOSIS — I70219 Atherosclerosis of native arteries of extremities with intermittent claudication, unspecified extremity: Secondary | ICD-10-CM

## 2023-07-21 DIAGNOSIS — I10 Essential (primary) hypertension: Secondary | ICD-10-CM | POA: Diagnosis not present

## 2023-07-21 DIAGNOSIS — E782 Mixed hyperlipidemia: Secondary | ICD-10-CM

## 2023-07-21 LAB — VAS US ABI WITH/WO TBI
Left ABI: 0.94
Right ABI: 1.06

## 2023-07-22 ENCOUNTER — Encounter (INDEPENDENT_AMBULATORY_CARE_PROVIDER_SITE_OTHER): Payer: Self-pay | Admitting: Vascular Surgery

## 2023-08-11 ENCOUNTER — Ambulatory Visit: Payer: MEDICAID | Admitting: Urology

## 2023-08-11 VITALS — BP 140/82 | HR 72 | Ht 69.0 in | Wt 180.0 lb

## 2023-08-11 DIAGNOSIS — Z87898 Personal history of other specified conditions: Secondary | ICD-10-CM | POA: Diagnosis not present

## 2023-08-11 DIAGNOSIS — Z09 Encounter for follow-up examination after completed treatment for conditions other than malignant neoplasm: Secondary | ICD-10-CM | POA: Diagnosis not present

## 2023-08-11 DIAGNOSIS — R310 Gross hematuria: Secondary | ICD-10-CM

## 2023-08-12 LAB — MICROSCOPIC EXAMINATION

## 2023-08-12 LAB — URINALYSIS, COMPLETE
Bilirubin, UA: NEGATIVE
Glucose, UA: NEGATIVE
Ketones, UA: NEGATIVE
Leukocytes,UA: NEGATIVE
Nitrite, UA: NEGATIVE
RBC, UA: NEGATIVE
Specific Gravity, UA: 1.025 (ref 1.005–1.030)
Urobilinogen, Ur: 1 mg/dL (ref 0.2–1.0)
pH, UA: 7 (ref 5.0–7.5)

## 2023-08-15 NOTE — Progress Notes (Signed)
   08/15/23  CC:  Chief Complaint  Patient presents with   Cysto    HPI: Refer to prior note 06/30/2023.  Denies recurrent gross hematuria.  UA today negative RBCs  Blood pressure (!) 140/82, pulse 72, height 5\' 9"  (1.753 m), weight 180 lb (81.6 kg). NED. A&Ox3.   No respiratory distress   Abd soft, NT, ND Normal phallus with bilateral descended testicles  Cystoscopy Procedure Note  Patient identification was confirmed, informed consent was obtained, and patient was prepped using Betadine solution.  Lidocaine jelly was administered per urethral meatus.     Pre-Procedure: - Inspection reveals a normal caliber urethral meatus.  Procedure: The flexible cystoscope was introduced without difficulty - No urethral strictures/lesions are present. -Mild lateral lobe enlargement prostate  - Normal bladder neck - Bilateral ureteral orifices identified - Bladder mucosa  reveals no ulcers, tumors, or lesions - No bladder stones - No trabeculation  Retroflexion shows no tumor, lesion or intravesical median lobe   Post-Procedure: - Patient tolerated the procedure well  Assessment/ Plan: No bladder mucosal abnormalities on cystoscopy Prior upper tract evaluation negative UA today clear 1 year follow-up with UA and instructed call earlier for recurrent gross hematuria    Riki Altes, MD

## 2023-08-23 ENCOUNTER — Encounter: Payer: Self-pay | Admitting: Urology

## 2023-09-11 ENCOUNTER — Other Ambulatory Visit: Payer: Self-pay | Admitting: Urology

## 2023-09-11 DIAGNOSIS — R310 Gross hematuria: Secondary | ICD-10-CM

## 2023-11-11 ENCOUNTER — Encounter (INDEPENDENT_AMBULATORY_CARE_PROVIDER_SITE_OTHER): Payer: Self-pay

## 2024-01-05 ENCOUNTER — Other Ambulatory Visit: Payer: Self-pay | Admitting: Medical Genetics

## 2024-01-18 NOTE — Progress Notes (Signed)
 MRN : [id number]  Jeffrey Grant. is a 43 y.o. (11/26/1980) male who presents with chief complaint of check circulation.  History of Present Illness:   The patient returns to the office for followup and review of his atherosclerotic occlusive disease.   The CT angiogram dated 10/23/2022 showed the aortobifemoral bypass is widely patent.  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  No evidence of bladder abnormalities.   The patient notes stability in the lower extremity symptoms. No interval shortening of the patient's claudication distance or rest pain symptoms. No new ulcers or wounds have occurred since the last visit.   There have been no significant changes to the patient's overall health care.   No documented history of amaurosis fugax or recent TIA symptoms. There are no recent neurological changes noted. No documented history of DVT, PE or superficial thrombophlebitis. The patient denies recent episodes of angina or shortness of breath.    ABI Rt=0.90 and Lt=0.75 (previous ABI's Rt=1.06 and Lt=0.94)  Duplex ultrasound of the aortobifemoral bypass graft demonstrates the bypass graft is widely patent there is no evidence of hemodynamically significant velocity shifts throughout the graft or at the anastomosis.  Again is noted a 50 to 75% stenosis in the left SFA.   Previous duplex ultrasound of the right lower extremity arterial system demonstrates uniform velocities no evidence of in-stent restenosis  No outpatient medications have been marked as taking for the 01/19/24 encounter (Appointment) with Jama, Cordella MATSU, MD.    Past Medical History:  Diagnosis Date   Anxiety    Atherosclerotic PVD with intermittent claudication (HCC)    Back pain    Collagen vascular disease (HCC)    Dyspnea    Dysrhythmia    Elevated lipids    Hypertension    Nausea    Seropositive rheumatoid arthritis (HCC)     Past Surgical  History:  Procedure Laterality Date   ABDOMINAL AORTOGRAM W/LOWER EXTREMITY N/A 02/26/2022   Procedure: ABDOMINAL AORTOGRAM W/LOWER EXTREMITY;  Surgeon: Jama Cordella MATSU, MD;  Location: [facility];  Service: Cardiovascular;  Laterality: N/A;   AORTA - BILATERAL FEMORAL ARTERY BYPASS GRAFT Bilateral 06/01/2021   Procedure: AORTA BIFEMORAL BYPASS GRAFT;  Surgeon: Jama Cordella MATSU, MD;  Location: [facility];  Service: Vascular;  Laterality: Bilateral;   APPLICATION OF WOUND VAC Right 02/12/2022   Procedure: APPLICATION OF WOUND VAC;  Surgeon: Jama Cordella MATSU, MD;  Location: [facility];  Service: Vascular;  Laterality: Right;   COLONOSCOPY WITH PROPOFOL  N/A 04/21/2018   Procedure: COLONOSCOPY WITH PROPOFOL ;  Surgeon: Therisa Bi, MD;  Location: [facility] ENDOSCOPY;  Service: Gastroenterology;  Laterality: N/A;   EMBOLECTOMY Right 02/12/2022   Procedure: EMBOLECTOMY;  Surgeon: Jama Cordella MATSU, MD;  Location: [facility];  Service: Vascular;  Laterality: Right;   ENDARTERECTOMY FEMORAL Right 02/12/2022   Procedure: ENDARTERECTOMY FEMORAL;  Surgeon: Jama Cordella MATSU, MD;  Location: [facility];  Service: Vascular;  Laterality: Right;   ENDARTERECTOMY POPLITEAL Right 06/02/2021   Procedure: Right Femoral Endarterectomy and Patching Endoplasty;  Surgeon: Serene Gaile ORN, MD;  Location: [facility];  Service: Vascular;  Laterality:  Right;   ESOPHAGOGASTRODUODENOSCOPY (EGD) WITH PROPOFOL  N/A 04/21/2018   Procedure: ESOPHAGOGASTRODUODENOSCOPY (EGD) WITH PROPOFOL ;  Surgeon: Therisa Bi, MD;  Location: [facility] ENDOSCOPY;  Service: Gastroenterology;  Laterality: N/A;   HIP PINNING Right    LOWER EXTREMITY ANGIOGRAPHY N/A 05/29/2021   Procedure: LOWER EXTREMITY ANGIOGRAPHY;  Surgeon: Jama Cordella MATSU, MD;  Location: [facility];  Service: Cardiovascular;  Laterality: N/A;   LOWER EXTREMITY ANGIOGRAPHY Left 07/24/2021   Procedure: LOWER EXTREMITY ANGIOGRAPHY;  Surgeon: Jama Cordella MATSU, MD;  Location:  [facility];  Service: Cardiovascular;  Laterality: Left;   LOWER EXTREMITY ANGIOGRAPHY Right 08/07/2021   Procedure: Lower Extremity Angiography;  Surgeon: Jama Cordella MATSU, MD;  Location: [facility];  Service: Cardiovascular;  Laterality: Right;   LOWER EXTREMITY ANGIOGRAPHY Left 02/12/2022   Procedure: Lower Extremity Angiography;  Surgeon: Jama Cordella MATSU, MD;  Location: [facility];  Service: Cardiovascular;  Laterality: Left;   TOTAL HIP ARTHROPLASTY Right 10/22/2016   Procedure: TOTAL HIP ARTHROPLASTY ANTERIOR APPROACH;  Surgeon: Ozell Flake, MD;  Location: [facility];  Service: Orthopedics;  Laterality: Right;    Social History Social History   Tobacco Use   Smoking status: Former    Current packs/day: 0.00    Average packs/day: 0.3 packs/day for 16.0 years (4.0 ttl pk-yrs)    Types: Cigarettes    Start date: 06/04/2005    Quit date: 06/04/2021    Years since quitting: 2.6   Smokeless tobacco: Never  Vaping Use   Vaping status: Never Used  Substance Use Topics   Alcohol use: No   Drug use: Not Currently    Family History No family history on file.  No Known Allergies   REVIEW OF SYSTEMS (Negative unless checked)  Constitutional: [] Weight loss  [] Fever  [] Chills Cardiac: [] Chest pain   [] Chest pressure   [] Palpitations   [] Shortness of breath when laying flat   [] Shortness of breath with exertion. Vascular:  [x] Pain in legs with walking   [] Pain in legs at rest  [] History of DVT   [] Phlebitis   [] Swelling in legs   [] Varicose veins   [] Non-healing ulcers Pulmonary:   [] Uses home oxygen   [] Productive cough   [] Hemoptysis   [] Wheeze  [] COPD   [] Asthma Neurologic:  [] Dizziness   [] Seizures   [] History of stroke   [] History of TIA  [] Aphasia   [] Vissual changes   [] Weakness or numbness in arm   [] Weakness or numbness in leg Musculoskeletal:   [] Joint swelling   [] Joint pain   [] Low back pain Hematologic:  [] Easy bruising  [] Easy bleeding    [] Hypercoagulable state   [] Anemic Gastrointestinal:  [] Diarrhea   [] Vomiting  [] Gastroesophageal reflux/heartburn   [] Difficulty swallowing. Genitourinary:  [] Chronic kidney disease   [] Difficult urination  [] Frequent urination   [] Blood in urine Skin:  [] Rashes   [] Ulcers  Psychological:  [] History of anxiety   []  History of major depression.  Physical Examination  There were no vitals filed for this visit. There is no height or weight on file to calculate BMI. Gen: WD/WN, NAD Head: [place]/AT, No temporalis wasting.  Ear/Nose/Throat: Hearing grossly intact, nares w/o erythema or drainage Eyes: PER, EOMI, sclera nonicteric.  Neck: Supple, no masses.  No bruit or JVD.  Pulmonary:  Good air movement, no audible wheezing, no use of accessory muscles.  Cardiac: RRR, normal S1, S2, no Murmurs. Vascular:  mild trophic changes, no open wounds Vessel Right Left  Radial Palpable Palpable  PT Not  Palpable Not Palpable  DP Not Palpable Not Palpable  Gastrointestinal: soft, non-distended. No guarding/no peritoneal signs.  Musculoskeletal: M/S 5/5 throughout.  No visible deformity.  Neurologic: CN 2-12 intact. Pain and light touch intact in extremities.  Symmetrical.  Speech is fluent. Motor exam as listed above. Psychiatric: Judgment intact, Mood & affect appropriate for pt's clinical situation. Dermatologic: No rashes or ulcers noted.  No changes consistent with cellulitis.   CBC Lab Results  Component Value Date   WBC 9.5 04/15/2023   HGB 14.4 04/15/2023   HCT 44.0 04/15/2023   MCV 88.7 04/15/2023   PLT 262 04/15/2023    BMET    Component Value Date/Time   NA 141 04/15/2023 0555   NA 140 03/30/2018 1404   K 3.8 04/15/2023 0555   CL 108 04/15/2023 0555   CO2 24 04/15/2023 0555   GLUCOSE 133 (H) 04/15/2023 0555   BUN 13 04/15/2023 0555   BUN 9 03/30/2018 1404   CREATININE 0.90 04/15/2023 0555   CALCIUM  9.5 04/15/2023 0555   GFRNONAA >60 04/15/2023 0555   GFRAA 129 03/30/2018  1404   CrCl cannot be calculated (Patient's most recent lab result is older than the maximum 21 days allowed.).  COAG Lab Results  Component Value Date   INR 1.0 06/10/2022   INR 1.0 02/24/2022   INR 1.0 02/23/2022    Radiology No results found.   Assessment/Plan 1. Atherosclerotic peripheral vascular disease with intermittent claudication (HCC) (Primary) Recommend:   The patient has evidence of atherosclerosis of the lower extremities with claudication.  The patient does not voice lifestyle limiting changes at this point in time.   Noninvasive studies do not suggest clinically significant change.   No invasive studies, angiography or surgery at this time The patient should continue walking and begin a more formal exercise program.  The patient should continue antiplatelet therapy and aggressive treatment of the lipid abnormalities   No changes in the patient's medications at this time   Continued surveillance is indicated as atherosclerosis is likely to progress with time.     The patient will continue follow up with noninvasive studies as ordered.  - VAS US  LOWER EXTREMITY ARTERIAL DUPLEX; Future - VAS US  ABI WITH/WO TBI; Future  2. Benign essential hypertension Continue antihypertensive medications as already ordered, these medications have been reviewed and there are no changes at this time.  3. Mixed hyperlipidemia Continue statin as ordered and reviewed, no changes at this time    Cordella Shawl, MD  01/18/2024 4:28 PM

## 2024-01-19 ENCOUNTER — Ambulatory Visit (INDEPENDENT_AMBULATORY_CARE_PROVIDER_SITE_OTHER): Payer: MEDICAID

## 2024-01-19 ENCOUNTER — Other Ambulatory Visit (INDEPENDENT_AMBULATORY_CARE_PROVIDER_SITE_OTHER): Payer: Self-pay | Admitting: Vascular Surgery

## 2024-01-19 ENCOUNTER — Encounter (INDEPENDENT_AMBULATORY_CARE_PROVIDER_SITE_OTHER): Payer: Self-pay | Admitting: Vascular Surgery

## 2024-01-19 ENCOUNTER — Ambulatory Visit (INDEPENDENT_AMBULATORY_CARE_PROVIDER_SITE_OTHER): Payer: MEDICAID | Admitting: Vascular Surgery

## 2024-01-19 VITALS — BP 185/114 | HR 87 | Resp 18 | Ht 69.0 in | Wt 174.2 lb

## 2024-01-19 DIAGNOSIS — Z9889 Other specified postprocedural states: Secondary | ICD-10-CM | POA: Diagnosis not present

## 2024-01-19 DIAGNOSIS — I739 Peripheral vascular disease, unspecified: Secondary | ICD-10-CM

## 2024-01-19 DIAGNOSIS — I10 Essential (primary) hypertension: Secondary | ICD-10-CM | POA: Diagnosis not present

## 2024-01-19 DIAGNOSIS — I70219 Atherosclerosis of native arteries of extremities with intermittent claudication, unspecified extremity: Secondary | ICD-10-CM | POA: Diagnosis not present

## 2024-01-19 DIAGNOSIS — E782 Mixed hyperlipidemia: Secondary | ICD-10-CM

## 2024-01-19 LAB — VAS US ABI WITH/WO TBI
Left ABI: 0.75
Right ABI: 0.9

## 2024-01-24 ENCOUNTER — Encounter (INDEPENDENT_AMBULATORY_CARE_PROVIDER_SITE_OTHER): Payer: Self-pay | Admitting: Vascular Surgery

## 2024-04-13 ENCOUNTER — Other Ambulatory Visit: Payer: Self-pay | Admitting: Medical Genetics

## 2024-04-13 DIAGNOSIS — Z006 Encounter for examination for normal comparison and control in clinical research program: Secondary | ICD-10-CM

## 2024-06-02 ENCOUNTER — Encounter: Payer: Self-pay | Admitting: Urology

## 2024-07-19 ENCOUNTER — Ambulatory Visit (INDEPENDENT_AMBULATORY_CARE_PROVIDER_SITE_OTHER): Payer: MEDICAID | Admitting: Vascular Surgery

## 2024-07-19 ENCOUNTER — Encounter (INDEPENDENT_AMBULATORY_CARE_PROVIDER_SITE_OTHER): Payer: MEDICAID

## 2024-07-19 NOTE — Progress Notes (Unsigned)
 "                                                                      MRN : [id number]  Jeffrey Grant. is a 44 y.o. ([birth year]) male who presents with chief complaint of check circulation.  History of Present Illness:   The patient returns to the office for followup and review of his atherosclerotic occlusive disease.   The CT angiogram dated 10/23/2022 showed the aortobifemoral bypass is widely patent.  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  No evidence of bladder abnormalities.   The patient notes stability in the lower extremity symptoms. No interval shortening of the patient's claudication distance or rest pain symptoms. No new ulcers or wounds have occurred since the last visit.   There have been no significant changes to the patient's overall health care.   No documented history of amaurosis fugax or recent TIA symptoms. There are no recent neurological changes noted. No documented history of DVT, PE or superficial thrombophlebitis. The patient denies recent episodes of angina or shortness of breath.    ABI Rt=0.90 and Lt=0.75 (previous ABI's Rt=1.06 and Lt=0.94)   Duplex ultrasound of the aortobifemoral bypass graft demonstrates the bypass graft is widely patent there is no evidence of hemodynamically significant velocity shifts throughout the graft or at the anastomosis.  Again is noted a 50 to 75% stenosis in the left SFA.   Previous duplex ultrasound of the right lower extremity arterial system demonstrates uniform velocities no evidence of in-stent restenosis  Active Medications[1]  Past Medical History:  Diagnosis Date   Anxiety    Atherosclerotic PVD with intermittent claudication    Back pain    Collagen vascular disease    Dyspnea    Dysrhythmia    Elevated lipids    Hypertension    Nausea    Seropositive rheumatoid arthritis (HCC)     Past Surgical History:  Procedure Laterality Date   ABDOMINAL AORTOGRAM W/LOWER EXTREMITY N/A 02/26/2022   Procedure: ABDOMINAL  AORTOGRAM W/LOWER EXTREMITY;  Surgeon: Jama Cordella MATSU, MD;  Location: [facility];  Service: Cardiovascular;  Laterality: N/A;   AORTA - BILATERAL FEMORAL ARTERY BYPASS GRAFT Bilateral 06/01/2021   Procedure: AORTA BIFEMORAL BYPASS GRAFT;  Surgeon: Jama Cordella MATSU, MD;  Location: [facility];  Service: Vascular;  Laterality: Bilateral;   APPLICATION OF WOUND VAC Right 02/12/2022   Procedure: APPLICATION OF WOUND VAC;  Surgeon: Jama Cordella MATSU, MD;  Location: [facility];  Service: Vascular;  Laterality: Right;   COLONOSCOPY WITH PROPOFOL  N/A 04/21/2018   Procedure: COLONOSCOPY WITH PROPOFOL ;  Surgeon: Therisa Bi, MD;  Location: [facility];  Service: Gastroenterology;  Laterality: N/A;   EMBOLECTOMY Right 02/12/2022   Procedure: EMBOLECTOMY;  Surgeon: Jama Cordella MATSU, MD;  Location: [facility];  Service: Vascular;  Laterality: Right;   ENDARTERECTOMY FEMORAL Right 02/12/2022   Procedure: ENDARTERECTOMY FEMORAL;  Surgeon: Jama Cordella MATSU, MD;  Location: [facility];  Service: Vascular;  Laterality: Right;   ENDARTERECTOMY POPLITEAL Right 06/02/2021   Procedure: Right Femoral Endarterectomy and Patching Endoplasty;  Surgeon: Serene Gaile ORN, MD;  Location: [facility];  Service: Vascular;  Laterality: Right;   ESOPHAGOGASTRODUODENOSCOPY (EGD) WITH PROPOFOL  N/A 04/21/2018   Procedure: ESOPHAGOGASTRODUODENOSCOPY (EGD) WITH PROPOFOL ;  Surgeon:  Therisa Bi, MD;  Location: [facility];  Service: Gastroenterology;  Laterality: N/A;   HIP PINNING Right    LOWER EXTREMITY ANGIOGRAPHY N/A 05/29/2021   Procedure: LOWER EXTREMITY ANGIOGRAPHY;  Surgeon: Jama Cordella MATSU, MD;  Location: [facility];  Service: Cardiovascular;  Laterality: N/A;   LOWER EXTREMITY ANGIOGRAPHY Left 07/24/2021   Procedure: LOWER EXTREMITY ANGIOGRAPHY;  Surgeon: Jama Cordella MATSU, MD;  Location: [facility];  Service: Cardiovascular;  Laterality: Left;   LOWER EXTREMITY ANGIOGRAPHY Right 08/07/2021    Procedure: Lower Extremity Angiography;  Surgeon: Jama Cordella MATSU, MD;  Location: [facility];  Service: Cardiovascular;  Laterality: Right;   LOWER EXTREMITY ANGIOGRAPHY Left 02/12/2022   Procedure: Lower Extremity Angiography;  Surgeon: Jama Cordella MATSU, MD;  Location: [facility];  Service: Cardiovascular;  Laterality: Left;   TOTAL HIP ARTHROPLASTY Right 10/22/2016   Procedure: TOTAL HIP ARTHROPLASTY ANTERIOR APPROACH;  Surgeon: Ozell Flake, MD;  Location: [facility];  Service: Orthopedics;  Laterality: Right;    Social History Social History[2]  Family History No family history on file.  Allergies[3]   REVIEW OF SYSTEMS (Negative unless checked)  Constitutional: [] Weight loss  [] Fever  [] Chills Cardiac: [] Chest pain   [] Chest pressure   [] Palpitations   [] Shortness of breath when laying flat   [] Shortness of breath with exertion. Vascular:  [x] Pain in legs with walking   [] Pain in legs at rest  [] History of DVT   [] Phlebitis   [] Swelling in legs   [] Varicose veins   [] Non-healing ulcers Pulmonary:   [] Uses home oxygen   [] Productive cough   [] Hemoptysis   [] Wheeze  [] COPD   [] Asthma Neurologic:  [] Dizziness   [] Seizures   [] History of stroke   [] History of TIA  [] Aphasia   [] Vissual changes   [] Weakness or numbness in arm   [] Weakness or numbness in leg Musculoskeletal:   [] Joint swelling   [] Joint pain   [] Low back pain Hematologic:  [] Easy bruising  [] Easy bleeding   [] Hypercoagulable state   [] Anemic Gastrointestinal:  [] Diarrhea   [] Vomiting  [] Gastroesophageal reflux/heartburn   [] Difficulty swallowing. Genitourinary:  [] Chronic kidney disease   [] Difficult urination  [] Frequent urination   [] Blood in urine Skin:  [] Rashes   [] Ulcers  Psychological:  [x] History of anxiety   [x]  History of major depression.  Physical Examination  There were no vitals filed for this visit. There is no height or weight on file to calculate BMI. Gen: WD/WN, NAD Head: [place]/AT,  No temporalis wasting.  Ear/Nose/Throat: Hearing grossly intact, nares w/o erythema or drainage Eyes: PER, EOMI, sclera nonicteric.  Neck: Supple, no masses.  No bruit or JVD.  Pulmonary:  Good air movement, no audible wheezing, no use of accessory muscles.  Cardiac: RRR, normal S1, S2, no Murmurs. Vascular:  mild trophic changes, no open wounds Vessel Right Left  Radial Palpable Palpable  PT Not Palpable Not Palpable  DP Not Palpable Not Palpable  Gastrointestinal: soft, non-distended. No guarding/no peritoneal signs.  Musculoskeletal: M/S 5/5 throughout.  No visible deformity.  Neurologic: CN 2-12 intact. Pain and light touch intact in extremities.  Symmetrical.  Speech is fluent. Motor exam as listed above. Psychiatric: Judgment intact, Mood & affect appropriate for pt's clinical situation. Dermatologic: No rashes or ulcers noted.  No changes consistent with cellulitis.   CBC Lab Results  Component Value Date   WBC 9.5 04/15/2023   HGB 14.4 04/15/2023   HCT 44.0 04/15/2023   MCV 88.7 04/15/2023  PLT 262 04/15/2023    BMET    Component Value Date/Time   NA 141 04/15/2023 0555   NA 140 03/30/2018 1404   K 3.8 04/15/2023 0555   CL 108 04/15/2023 0555   CO2 24 04/15/2023 0555   GLUCOSE 133 (H) 04/15/2023 0555   BUN 13 04/15/2023 0555   BUN 9 03/30/2018 1404   CREATININE 0.90 04/15/2023 0555   CALCIUM  9.5 04/15/2023 0555   GFRNONAA >60 04/15/2023 0555   GFRAA 129 03/30/2018 1404   CrCl cannot be calculated (Patient's most recent lab result is older than the maximum 21 days allowed.).  COAG Lab Results  Component Value Date   INR 1.0 06/10/2022   INR 1.0 02/24/2022   INR 1.0 02/23/2022    Radiology No results found.   Assessment/Plan There are no diagnoses linked to this encounter.   Cordella Shawl, MD  07/19/2024 2:29 PM      [1]  No outpatient medications have been marked as taking for the 07/22/24 encounter (Appointment) with Shawl, Cordella MATSU,  MD.  [2]  Social History Tobacco Use   Smoking status: Former    Current packs/day: 0.00    Average packs/day: 0.3 packs/day for 16.0 years (4.0 ttl pk-yrs)    Types: Cigarettes    Start date: 06/04/2005    Quit date: 06/04/2021    Years since quitting: 3.1   Smokeless tobacco: Never  Vaping Use   Vaping status: Never Used  Substance Use Topics   Alcohol use: No   Drug use: Not Currently  [3] No Known Allergies  "

## 2024-07-22 ENCOUNTER — Ambulatory Visit (INDEPENDENT_AMBULATORY_CARE_PROVIDER_SITE_OTHER): Payer: MEDICAID | Admitting: Vascular Surgery

## 2024-07-22 ENCOUNTER — Encounter (INDEPENDENT_AMBULATORY_CARE_PROVIDER_SITE_OTHER): Payer: Self-pay | Admitting: Vascular Surgery

## 2024-07-22 ENCOUNTER — Ambulatory Visit (INDEPENDENT_AMBULATORY_CARE_PROVIDER_SITE_OTHER): Payer: MEDICAID

## 2024-07-22 VITALS — BP 154/82 | HR 97 | Ht 68.0 in | Wt 187.0 lb

## 2024-07-22 DIAGNOSIS — I70219 Atherosclerosis of native arteries of extremities with intermittent claudication, unspecified extremity: Secondary | ICD-10-CM

## 2024-07-22 DIAGNOSIS — M069 Rheumatoid arthritis, unspecified: Secondary | ICD-10-CM

## 2024-07-22 DIAGNOSIS — E782 Mixed hyperlipidemia: Secondary | ICD-10-CM

## 2024-07-22 DIAGNOSIS — I10 Essential (primary) hypertension: Secondary | ICD-10-CM

## 2024-07-22 LAB — VAS US ABI WITH/WO TBI
Left ABI: 0.71
Right ABI: 0.89

## 2024-07-28 ENCOUNTER — Encounter (INDEPENDENT_AMBULATORY_CARE_PROVIDER_SITE_OTHER): Payer: Self-pay | Admitting: Vascular Surgery

## 2024-08-11 ENCOUNTER — Ambulatory Visit: Payer: MEDICAID | Admitting: Urology

## 2024-08-18 ENCOUNTER — Ambulatory Visit: Payer: MEDICAID | Admitting: Urology

## 2025-01-17 ENCOUNTER — Encounter (INDEPENDENT_AMBULATORY_CARE_PROVIDER_SITE_OTHER): Payer: MEDICAID

## 2025-01-17 ENCOUNTER — Ambulatory Visit (INDEPENDENT_AMBULATORY_CARE_PROVIDER_SITE_OTHER): Payer: MEDICAID | Admitting: Vascular Surgery
